# Patient Record
Sex: Female | Born: 1950
Health system: Southern US, Community
[De-identification: ages and names within clinical notes are randomized; demographics above are authoritative.]

## PROBLEM LIST (undated history)

## (undated) DIAGNOSIS — K219 Gastro-esophageal reflux disease without esophagitis: Secondary | ICD-10-CM

## (undated) DIAGNOSIS — D689 Coagulation defect, unspecified: Secondary | ICD-10-CM

## (undated) DIAGNOSIS — T50905A Adverse effect of unspecified drugs, medicaments and biological substances, initial encounter: Secondary | ICD-10-CM

## (undated) DIAGNOSIS — G20A1 Parkinson's disease without dyskinesia, without mention of fluctuations: Secondary | ICD-10-CM

## (undated) DIAGNOSIS — J45909 Unspecified asthma, uncomplicated: Secondary | ICD-10-CM

## (undated) DIAGNOSIS — C801 Malignant (primary) neoplasm, unspecified: Secondary | ICD-10-CM

## (undated) DIAGNOSIS — N189 Chronic kidney disease, unspecified: Secondary | ICD-10-CM

## (undated) DIAGNOSIS — G629 Polyneuropathy, unspecified: Secondary | ICD-10-CM

## (undated) DIAGNOSIS — I739 Peripheral vascular disease, unspecified: Secondary | ICD-10-CM

## (undated) DIAGNOSIS — M810 Age-related osteoporosis without current pathological fracture: Secondary | ICD-10-CM

## (undated) DIAGNOSIS — M8580 Other specified disorders of bone density and structure, unspecified site: Secondary | ICD-10-CM

## (undated) DIAGNOSIS — H269 Unspecified cataract: Secondary | ICD-10-CM

## (undated) DIAGNOSIS — R112 Nausea with vomiting, unspecified: Secondary | ICD-10-CM

## (undated) DIAGNOSIS — M1990 Unspecified osteoarthritis, unspecified site: Secondary | ICD-10-CM

## (undated) DIAGNOSIS — G43909 Migraine, unspecified, not intractable, without status migrainosus: Secondary | ICD-10-CM

## (undated) DIAGNOSIS — R161 Splenomegaly, not elsewhere classified: Secondary | ICD-10-CM

## (undated) DIAGNOSIS — D6959 Other secondary thrombocytopenia: Secondary | ICD-10-CM

## (undated) DIAGNOSIS — K769 Liver disease, unspecified: Secondary | ICD-10-CM

## (undated) DIAGNOSIS — D649 Anemia, unspecified: Secondary | ICD-10-CM

## (undated) DIAGNOSIS — J189 Pneumonia, unspecified organism: Secondary | ICD-10-CM

## (undated) DIAGNOSIS — K449 Diaphragmatic hernia without obstruction or gangrene: Secondary | ICD-10-CM

## (undated) DIAGNOSIS — G20 Parkinson's disease: Secondary | ICD-10-CM

## (undated) DIAGNOSIS — K766 Portal hypertension: Secondary | ICD-10-CM

## (undated) DIAGNOSIS — Z8489 Family history of other specified conditions: Secondary | ICD-10-CM

## (undated) DIAGNOSIS — F329 Major depressive disorder, single episode, unspecified: Secondary | ICD-10-CM

## (undated) DIAGNOSIS — K7460 Unspecified cirrhosis of liver: Secondary | ICD-10-CM

## (undated) DIAGNOSIS — K269 Duodenal ulcer, unspecified as acute or chronic, without hemorrhage or perforation: Secondary | ICD-10-CM

## (undated) DIAGNOSIS — T7840XA Allergy, unspecified, initial encounter: Secondary | ICD-10-CM

## (undated) DIAGNOSIS — D71 Functional disorders of polymorphonuclear neutrophils: Secondary | ICD-10-CM

## (undated) DIAGNOSIS — Z9889 Other specified postprocedural states: Secondary | ICD-10-CM

## (undated) DIAGNOSIS — R7989 Other specified abnormal findings of blood chemistry: Secondary | ICD-10-CM

## (undated) DIAGNOSIS — E049 Nontoxic goiter, unspecified: Secondary | ICD-10-CM

## (undated) DIAGNOSIS — I10 Essential (primary) hypertension: Secondary | ICD-10-CM

## (undated) DIAGNOSIS — F32A Depression, unspecified: Secondary | ICD-10-CM

## (undated) DIAGNOSIS — I209 Angina pectoris, unspecified: Secondary | ICD-10-CM

## (undated) DIAGNOSIS — Z87442 Personal history of urinary calculi: Secondary | ICD-10-CM

## (undated) HISTORY — DX: Diaphragmatic hernia without obstruction or gangrene: K44.9

## (undated) HISTORY — DX: Gastro-esophageal reflux disease without esophagitis: K21.9

## (undated) HISTORY — DX: Chronic kidney disease, unspecified: N18.9

## (undated) HISTORY — DX: Unspecified osteoarthritis, unspecified site: M19.90

## (undated) HISTORY — PX: APPENDECTOMY: SHX54

## (undated) HISTORY — DX: Polyneuropathy, unspecified: G62.9

## (undated) HISTORY — DX: Anemia, unspecified: D64.9

## (undated) HISTORY — DX: Other secondary thrombocytopenia: T50.905A

## (undated) HISTORY — DX: Malignant (primary) neoplasm, unspecified: C80.1

## (undated) HISTORY — DX: Parkinson's disease: G20

## (undated) HISTORY — DX: Portal hypertension: K76.6

## (undated) HISTORY — PX: BACK SURGERY: SHX140

## (undated) HISTORY — PX: UPPER GASTROINTESTINAL ENDOSCOPY: SHX188

## (undated) HISTORY — PX: EYE SURGERY: SHX253

## (undated) HISTORY — DX: Splenomegaly, not elsewhere classified: R16.1

## (undated) HISTORY — DX: Other specified disorders of bone density and structure, unspecified site: M85.80

## (undated) HISTORY — DX: Functional disorders of polymorphonuclear neutrophils: D71

## (undated) HISTORY — DX: Other secondary thrombocytopenia: D69.59

## (undated) HISTORY — DX: Other specified abnormal findings of blood chemistry: R79.89

## (undated) HISTORY — DX: Parkinson's disease without dyskinesia, without mention of fluctuations: G20.A1

## (undated) HISTORY — DX: Unspecified cataract: H26.9

## (undated) HISTORY — DX: Liver disease, unspecified: K76.9

## (undated) HISTORY — DX: Angina pectoris, unspecified: I20.9

## (undated) HISTORY — DX: Allergy, unspecified, initial encounter: T78.40XA

## (undated) HISTORY — PX: GASTROSTOMY: SHX151

## (undated) HISTORY — PX: ABDOMINAL HYSTERECTOMY: SHX81

## (undated) HISTORY — DX: Age-related osteoporosis without current pathological fracture: M81.0

## (undated) HISTORY — PX: COLONOSCOPY: SHX174

## (undated) HISTORY — DX: Coagulation defect, unspecified: D68.9

---

## 1997-11-14 ENCOUNTER — Ambulatory Visit (HOSPITAL_COMMUNITY): Admission: RE | Admit: 1997-11-14 | Discharge: 1997-11-14 | Payer: Self-pay | Admitting: *Deleted

## 2008-05-20 HISTORY — PX: ESOPHAGOGASTRODUODENOSCOPY: SHX1529

## 2014-04-08 ENCOUNTER — Other Ambulatory Visit (HOSPITAL_COMMUNITY): Payer: Self-pay | Admitting: Internal Medicine

## 2014-04-08 DIAGNOSIS — Z1231 Encounter for screening mammogram for malignant neoplasm of breast: Secondary | ICD-10-CM

## 2014-04-22 ENCOUNTER — Ambulatory Visit (HOSPITAL_COMMUNITY): Payer: Self-pay | Attending: Internal Medicine

## 2014-05-28 ENCOUNTER — Ambulatory Visit: Payer: Self-pay

## 2015-08-25 DIAGNOSIS — L249 Irritant contact dermatitis, unspecified cause: Secondary | ICD-10-CM | POA: Insufficient documentation

## 2015-08-25 HISTORY — DX: Irritant contact dermatitis, unspecified cause: L24.9

## 2015-10-30 ENCOUNTER — Emergency Department (HOSPITAL_COMMUNITY): Payer: Medicare Other

## 2015-10-30 ENCOUNTER — Encounter (HOSPITAL_COMMUNITY): Payer: Self-pay | Admitting: Emergency Medicine

## 2015-10-30 ENCOUNTER — Emergency Department (HOSPITAL_COMMUNITY)
Admission: EM | Admit: 2015-10-30 | Discharge: 2015-10-30 | Disposition: A | Discharge status: Home / Self Care | Payer: Medicare Other | Attending: Emergency Medicine | Admitting: Emergency Medicine

## 2015-10-30 DIAGNOSIS — I10 Essential (primary) hypertension: Secondary | ICD-10-CM | POA: Insufficient documentation

## 2015-10-30 DIAGNOSIS — L03311 Cellulitis of abdominal wall: Secondary | ICD-10-CM | POA: Diagnosis not present

## 2015-10-30 DIAGNOSIS — T85528A Displacement of other gastrointestinal prosthetic devices, implants and grafts, initial encounter: Secondary | ICD-10-CM | POA: Diagnosis not present

## 2015-10-30 DIAGNOSIS — T85848A Pain due to other internal prosthetic devices, implants and grafts, initial encounter: Secondary | ICD-10-CM

## 2015-10-30 DIAGNOSIS — Y738 Miscellaneous gastroenterology and urology devices associated with adverse incidents, not elsewhere classified: Secondary | ICD-10-CM | POA: Insufficient documentation

## 2015-10-30 DIAGNOSIS — G20 Parkinson's disease: Secondary | ICD-10-CM | POA: Diagnosis not present

## 2015-10-30 HISTORY — DX: Essential (primary) hypertension: I10

## 2015-10-30 HISTORY — DX: Duodenal ulcer, unspecified as acute or chronic, without hemorrhage or perforation: K26.9

## 2015-10-30 LAB — COMPREHENSIVE METABOLIC PANEL
ALBUMIN: 4.1 g/dL (ref 3.5–5.0)
ALK PHOS: 96 U/L (ref 38–126)
ALT: 30 U/L (ref 14–54)
ANION GAP: 9 (ref 5–15)
AST: 54 U/L — ABNORMAL HIGH (ref 15–41)
BUN: 15 mg/dL (ref 6–20)
CHLORIDE: 104 mmol/L (ref 101–111)
CO2: 29 mmol/L (ref 22–32)
Calcium: 9.3 mg/dL (ref 8.9–10.3)
Creatinine, Ser: 0.6 mg/dL (ref 0.44–1.00)
GFR calc Af Amer: >60 mL/min (ref >60)
GFR calc non Af Amer: >60 mL/min (ref >60)
GLUCOSE: 99 mg/dL (ref 65–99)
POTASSIUM: 3.7 mmol/L (ref 3.5–5.1)
Sodium: 142 mmol/L (ref 135–145)
Total Bilirubin: 1.5 mg/dL — ABNORMAL HIGH (ref 0.3–1.2)
Total Protein: 7.7 g/dL (ref 6.5–8.1)

## 2015-10-30 LAB — CBC WITH DIFFERENTIAL/PLATELET
Basophils Absolute: 0.1 10*3/uL (ref 0.0–0.1)
Basophils Relative: 1 %
Eosinophils Absolute: 0.1 10*3/uL (ref 0.0–0.7)
Eosinophils Relative: 2 %
HCT: 38.6 % (ref 36.0–46.0)
Hemoglobin: 13 g/dL (ref 12.0–15.0)
Lymphocytes Relative: 23 %
Lymphs Abs: 1.2 10*3/uL (ref 0.7–4.0)
MCH: 31.6 pg (ref 26.0–34.0)
MCHC: 33.7 g/dL (ref 30.0–36.0)
MCV: 93.9 fL (ref 78.0–100.0)
Monocytes Absolute: 0.4 10*3/uL (ref 0.1–1.0)
Monocytes Relative: 7 %
Neutro Abs: 3.3 10*3/uL (ref 1.7–7.7)
Neutrophils Relative %: 67 %
Platelets: 112 10*3/uL — ABNORMAL LOW (ref 150–400)
RBC: 4.11 MIL/uL (ref 3.87–5.11)
RDW: 13.5 % (ref 11.5–15.5)
WBC: 5.1 10*3/uL (ref 4.0–10.5)

## 2015-10-30 LAB — POC OCCULT BLOOD, ED: FECAL OCCULT BLD: NEGATIVE

## 2015-10-30 LAB — PROTIME-INR
INR: 1.31 (ref 0.00–1.49)
PROTHROMBIN TIME: 15.9 s — AB (ref 11.6–15.2)

## 2015-10-30 LAB — LIPASE, BLOOD: Lipase: 16 U/L (ref 11–51)

## 2015-10-30 MED ORDER — DIATRIZOATE MEGLUMINE & SODIUM 66-10 % PO SOLN
30.0000 mL | Freq: Once | ORAL | Status: AC
  Administered 2015-10-30: 30 mL

## 2015-10-30 MED ORDER — IOPAMIDOL (ISOVUE-300) INJECTION 61%
100.0000 mL | Freq: Once | INTRAVENOUS | Status: AC | PRN
  Administered 2015-10-30: 100 mL via INTRAVENOUS

## 2015-10-30 MED ORDER — SODIUM CHLORIDE 0.9 % IV BOLUS (SEPSIS)
500.0000 mL | Freq: Once | INTRAVENOUS | Status: AC
  Administered 2015-10-30: 500 mL via INTRAVENOUS

## 2015-10-30 MED ORDER — CLINDAMYCIN PALMITATE HCL 75 MG/5ML PO SOLR
300.0000 mg | Freq: Once | ORAL | Status: AC
  Administered 2015-10-30: 300 mg
  Filled 2015-10-30: qty 20

## 2015-10-30 MED ORDER — HYDROCODONE-ACETAMINOPHEN 7.5-325 MG/15ML PO SOLN
15.0000 mL | Freq: Once | ORAL | Status: AC
  Administered 2015-10-30: 15 mL via ORAL
  Filled 2015-10-30: qty 15

## 2015-10-30 MED ORDER — CLINDAMYCIN PALMITATE HCL 75 MG/5ML PO SOLR: 300.0000 mg | Freq: Four times a day (QID) | ORAL | Status: DC

## 2015-10-30 NOTE — ED Notes (Signed)
MD at bedside. 

## 2015-10-30 NOTE — ED Notes (Signed)
D/c after dry dressing applied to g-tube site.

## 2015-10-30 NOTE — ED Notes (Signed)
Waiting on antibiotics from pharmacy prior to discharge.

## 2015-10-30 NOTE — ED Provider Notes (Signed)
CSN: [id number]     Arrival date & time 10/30/15  1420 History   First MD Initiated Contact with Patient 10/30/15 1429     Chief Complaint  Patient presents with  . Bleeding from PEG Tube      (Consider location/radiation/quality/duration/timing/severity/associated sxs/prior Treatment) HPI  65 year old female with a history of Parkinson's and a PEG tube presents with bleeding around her PEG tube site. She states that yesterday her PEG tube fell out, which is not uncommon for her. She went to [facility] where they placed a Foley and sent her home. Sometime today (unclear when, patient is a poor historian) she had bleeding around the site. She states it was "a lot". Not currently bleeding. There was no blood coming out of the Foley catheter. Patient has redness around her PEG tube site, patient states it has been this way for about 2 weeks. There has also been drainage around the tube for about 2 weeks. States she's had fevers on and off, none currently.   No past medical history on file. No past surgical history on file. No family history on file. Social History  Substance Use Topics  . Smoking status: Not on file  . Smokeless tobacco: Not on file  . Alcohol Use: Not on file   OB History    No data available     Review of Systems  Gastrointestinal: Positive for abdominal pain. Negative for vomiting.  Skin: Positive for color change and wound.  All other systems reviewed and are negative.     Allergies  Review of patient's allergies indicates not on file.  Home Medications   Prior to Admission medications   Not on File   BP 141/70 mmHg  Pulse 101  Temp(Src) 98.3 F (36.8 C) (Oral)  Resp 18  SpO2 92% Physical Exam  Constitutional: She is oriented to person, place, and time. She appears well-developed and well-nourished.  HENT:  Head: Normocephalic and atraumatic.  Right Ear: External ear normal.  Left Ear: External ear normal.  Nose: Nose normal.  Eyes: Right  eye exhibits no discharge. Left eye exhibits no discharge.  Cardiovascular: Normal rate, regular rhythm and normal heart sounds.   Pulmonary/Chest: Effort normal and breath sounds normal.  Abdominal: Soft. There is tenderness in the epigastric area.    Neurological: She is alert and oriented to person, place, and time.  Skin: Skin is warm and dry.  Nursing note and vitals reviewed.   ED Course  Procedures (including critical care time) Labs Review Labs Reviewed  COMPREHENSIVE METABOLIC PANEL - Abnormal; Notable for the following:    AST 54 (*)    Total Bilirubin 1.5 (*)    All other components within normal limits  CBC WITH DIFFERENTIAL/PLATELET - Abnormal; Notable for the following:    Platelets 112 (*)    All other components within normal limits  PROTIME-INR - Abnormal; Notable for the following:    Prothrombin Time 15.9 (*)    All other components within normal limits  LIPASE, BLOOD  POC OCCULT BLOOD, ED    Imaging Review Dg Abd 1 View  10/30/2015  CLINICAL DATA:  Evaluate percutaneous gastrostomy tube/Foley catheter. EXAM: ABDOMEN - 1 VIEW COMPARISON:  None. FINDINGS: Contrast was injected through the percutaneous gastrostomy and opacifies distal stomach and duodenum. There is no evidence of extravasation. High-density material over the right abdomen mainly conforms to colon. Nonobstructive bowel gas pattern. IMPRESSION: Percutaneous gastrostomy catheter is intraluminal. No suspected extravasation; high-density over the right abdomen is likely  intracolonic and can be confirmed on pending abdominal CT. Electronically Signed   By: Monte Fantasia M.D.   On: 10/30/2015 15:23   Ct Abdomen Pelvis W Contrast  10/30/2015  CLINICAL DATA:  Bleeding from PEG tube site, PEG tube inserted yesterday after previous PEG tube of 4 years duration fell out ; history Parkinson's, hypertension, ulcer disease, former smoker EXAM: CT ABDOMEN AND PELVIS WITH CONTRAST TECHNIQUE: Multidetector CT imaging  of the abdomen and pelvis was performed using the standard protocol following bolus administration of intravenous contrast. Sagittal and coronal MPR images reconstructed from axial data set. CONTRAST:  185mL ISOVUE-300 IOPAMIDOL (ISOVUE-300) INJECTION 61% IV. Dilute oral contrast. COMPARISON:  None FINDINGS: Subsegmental atelectasis RIGHT lower lobe. Nodular appearing hepatic margins question cirrhosis. Liver, gallbladder, spleen, kidneys, and adrenal glands otherwise normal appearance. Significant fatty replacement of pancreas without definite focal mass. PEG tube enters stomach with tip extending into proximal descending duodenum. Stomach decompressed without obvious wall thickening, mass or surrounding infiltration. Prominent stool in rectum. Bowel loops otherwise normal appearance. Uterus surgically absent with nonvisualization of ovaries and appendix. Unremarkable bladder and ureters. Scattered atherosclerotic calcifications. No mass, adenopathy, free air, free fluid, or inflammatory process. Scattered degenerative changes of the thoracolumbar spine. IMPRESSION: PEG tube traverses stomach with tip in proximal descending duodenum. No definite focal gastric or duodenal abnormalities otherwise identified. Question cirrhotic liver. Fatty replacement of pancreas. Electronically Signed   By: Lavonia Dana M.D.   On: 10/30/2015 17:04   I have personally reviewed and evaluated these images and lab results as part of my medical decision-making.   EKG Interpretation None      MDM   Final diagnoses:  Cellulitis of abdominal wall  Pain around PEG tube site, initial encounter    No significant abnormalities on CT scan. I think the bleeding became up around her PEG tube site was likely from irritation of the Foley catheter being placed last night. No signs of significant bleeding at this time. The skin around her PEG tube site is quite irritated and there has been a low but of discharge. I will treat her for  cellulitis with oral antibiotics. Highly doubt acute GI bleeding. Hemoglobin is stable. Her vital signs are stable and she appears quite comfortable. His charge home to follow-up with her outpatient gastroenterologist for replacement of her PEG tube as the Foley catheter is currently working.    Sherwood Gambler, MD 10/30/15 1729

## 2015-10-30 NOTE — ED Notes (Signed)
Bed: WA17 Expected date:  Expected time:  Means of arrival:  Comments: EMS- 65 yo bleeding around PEG tube

## 2015-10-30 NOTE — ED Notes (Signed)
Pt c/o bleeding from site of PEG tube, which was inserted yesterday at [facility]. Pt had had a previous PEG tube x 4 years which fell out yesterday.

## 2015-11-26 ENCOUNTER — Emergency Department (HOSPITAL_COMMUNITY)
Admission: EM | Admit: 2015-11-26 | Discharge: 2015-11-26 | Disposition: A | Discharge status: Home / Self Care | Payer: Medicare Other | Attending: Emergency Medicine | Admitting: Emergency Medicine

## 2015-11-26 ENCOUNTER — Emergency Department (HOSPITAL_COMMUNITY): Payer: Medicare Other

## 2015-11-26 ENCOUNTER — Encounter (HOSPITAL_COMMUNITY): Payer: Self-pay | Admitting: Emergency Medicine

## 2015-11-26 DIAGNOSIS — I10 Essential (primary) hypertension: Secondary | ICD-10-CM | POA: Diagnosis not present

## 2015-11-26 DIAGNOSIS — Y732 Prosthetic and other implants, materials and accessory gastroenterology and urology devices associated with adverse incidents: Secondary | ICD-10-CM | POA: Insufficient documentation

## 2015-11-26 DIAGNOSIS — R1032 Left lower quadrant pain: Secondary | ICD-10-CM | POA: Diagnosis not present

## 2015-11-26 DIAGNOSIS — G20 Parkinson's disease: Secondary | ICD-10-CM | POA: Insufficient documentation

## 2015-11-26 DIAGNOSIS — Z4659 Encounter for fitting and adjustment of other gastrointestinal appliance and device: Secondary | ICD-10-CM

## 2015-11-26 DIAGNOSIS — T85598A Other mechanical complication of other gastrointestinal prosthetic devices, implants and grafts, initial encounter: Secondary | ICD-10-CM | POA: Diagnosis present

## 2015-11-26 DIAGNOSIS — Z87891 Personal history of nicotine dependence: Secondary | ICD-10-CM | POA: Diagnosis not present

## 2015-11-26 LAB — COMPREHENSIVE METABOLIC PANEL
ALBUMIN: 3.9 g/dL (ref 3.5–5.0)
ALK PHOS: 80 U/L (ref 38–126)
ALT: 25 U/L (ref 14–54)
AST: 30 U/L (ref 15–41)
Anion gap: 8 (ref 5–15)
BILIRUBIN TOTAL: 1.1 mg/dL (ref 0.3–1.2)
BUN: 11 mg/dL (ref 6–20)
CALCIUM: 9 mg/dL (ref 8.9–10.3)
CO2: 27 mmol/L (ref 22–32)
Chloride: 105 mmol/L (ref 101–111)
Creatinine, Ser: 0.57 mg/dL (ref 0.44–1.00)
GFR calc Af Amer: >60 mL/min (ref >60)
GFR calc non Af Amer: >60 mL/min (ref >60)
GLUCOSE: 88 mg/dL (ref 65–99)
POTASSIUM: 3.8 mmol/L (ref 3.5–5.1)
SODIUM: 140 mmol/L (ref 135–145)
TOTAL PROTEIN: 7.2 g/dL (ref 6.5–8.1)

## 2015-11-26 LAB — CBC WITH DIFFERENTIAL/PLATELET
BASOS ABS: 0 10*3/uL (ref 0.0–0.1)
BASOS PCT: 0 %
Eosinophils Absolute: 0.1 10*3/uL (ref 0.0–0.7)
Eosinophils Relative: 2 %
HEMATOCRIT: 37.6 % (ref 36.0–46.0)
HEMOGLOBIN: 12.9 g/dL (ref 12.0–15.0)
Lymphocytes Relative: 30 %
Lymphs Abs: 1.5 10*3/uL (ref 0.7–4.0)
MCH: 32.3 pg (ref 26.0–34.0)
MCHC: 34.3 g/dL (ref 30.0–36.0)
MCV: 94 fL (ref 78.0–100.0)
Monocytes Absolute: 0.4 10*3/uL (ref 0.1–1.0)
Monocytes Relative: 7 %
NEUTROS ABS: 3.1 10*3/uL (ref 1.7–7.7)
Neutrophils Relative %: 61 %
Platelets: 92 10*3/uL — ABNORMAL LOW (ref 150–400)
RBC: 4 MIL/uL (ref 3.87–5.11)
RDW: 12.6 % (ref 11.5–15.5)
WBC: 5 10*3/uL (ref 4.0–10.5)

## 2015-11-26 MED ORDER — IOPAMIDOL (ISOVUE-300) INJECTION 61%
100.0000 mL | Freq: Once | INTRAVENOUS | Status: AC | PRN
  Administered 2015-11-26: 100 mL via INTRAVENOUS

## 2015-11-26 MED ORDER — DIATRIZOATE MEGLUMINE & SODIUM 66-10 % PO SOLN
15.0000 mL | Freq: Once | ORAL | Status: AC
  Administered 2015-11-26: 15 mL via ORAL

## 2015-11-26 MED ORDER — HYDROMORPHONE HCL 1 MG/ML IJ SOLN
0.5000 mg | Freq: Once | INTRAMUSCULAR | Status: AC
  Administered 2015-11-26: 0.5 mg via INTRAVENOUS
  Filled 2015-11-26: qty 1

## 2015-11-26 MED ORDER — SILVER SULFADIAZINE 1 % EX CREA
TOPICAL_CREAM | Freq: Two times a day (BID) | CUTANEOUS | Status: DC
  Filled 2015-11-26: qty 50

## 2015-11-26 MED ORDER — ONDANSETRON HCL 4 MG/2ML IJ SOLN
4.0000 mg | Freq: Once | INTRAMUSCULAR | Status: AC
  Administered 2015-11-26: 4 mg via INTRAVENOUS
  Filled 2015-11-26: qty 2

## 2015-11-26 MED ORDER — LIDOCAINE HCL 2 % IJ SOLN
10.0000 mL | Freq: Once | INTRAMUSCULAR | Status: AC
  Administered 2015-11-26: 200 mg
  Filled 2015-11-26: qty 20

## 2015-11-26 NOTE — ED Notes (Signed)
Pt complaint of right abdominal pain radiating to back related to feeding tube dislodged/removed with transfer.

## 2015-11-26 NOTE — ED Provider Notes (Signed)
6:35 PM patient feels awake alert ready to go home. Results for orders placed or performed during the hospital encounter of 11/26/15  CBC with Differential/Platelet  Result Value Ref Range   WBC 5.0 4.0 - 10.5 K/uL   RBC 4.00 3.87 - 5.11 MIL/uL   Hemoglobin 12.9 12.0 - 15.0 g/dL   HCT 37.6 36.0 - 46.0 %   MCV 94.0 78.0 - 100.0 fL   MCH 32.3 26.0 - 34.0 pg   MCHC 34.3 30.0 - 36.0 g/dL   RDW 12.6 11.5 - 15.5 %   Platelets 92 (L) 150 - 400 K/uL   Neutrophils Relative % 61 %   Neutro Abs 3.1 1.7 - 7.7 K/uL   Lymphocytes Relative 30 %   Lymphs Abs 1.5 0.7 - 4.0 K/uL   Monocytes Relative 7 %   Monocytes Absolute 0.4 0.1 - 1.0 K/uL   Eosinophils Relative 2 %   Eosinophils Absolute 0.1 0.0 - 0.7 K/uL   Basophils Relative 0 %   Basophils Absolute 0.0 0.0 - 0.1 K/uL  Comprehensive metabolic panel  Result Value Ref Range   Sodium 140 135 - 145 mmol/L   Potassium 3.8 3.5 - 5.1 mmol/L   Chloride 105 101 - 111 mmol/L   CO2 27 22 - 32 mmol/L   Glucose, Bld 88 65 - 99 mg/dL   BUN 11 6 - 20 mg/dL   Creatinine, Ser 0.57 0.44 - 1.00 mg/dL   Calcium 9.0 8.9 - 10.3 mg/dL   Total Protein 7.2 6.5 - 8.1 g/dL   Albumin 3.9 3.5 - 5.0 g/dL   AST 30 15 - 41 U/L   ALT 25 14 - 54 U/L   Alkaline Phosphatase 80 38 - 126 U/L   Total Bilirubin 1.1 0.3 - 1.2 mg/dL   GFR calc non Af Amer >60 >60 mL/min   GFR calc Af Amer >60 >60 mL/min   Anion gap 8 5 - 15   Dg Abd 1 View  10/30/2015  CLINICAL DATA:  Evaluate percutaneous gastrostomy tube/Foley catheter. EXAM: ABDOMEN - 1 VIEW COMPARISON:  None. FINDINGS: Contrast was injected through the percutaneous gastrostomy and opacifies distal stomach and duodenum. There is no evidence of extravasation. High-density material over the right abdomen mainly conforms to colon. Nonobstructive bowel gas pattern. IMPRESSION: Percutaneous gastrostomy catheter is intraluminal. No suspected extravasation; high-density over the right abdomen is likely intracolonic and can be  confirmed on pending abdominal CT. Electronically Signed   By: Monte Fantasia M.D.   On: 10/30/2015 15:23   Ct Abdomen Pelvis W Contrast  11/26/2015  CLINICAL DATA:  Right abdominal pain radiating to back. Feeding tube dislodged with transfer EXAM: CT ABDOMEN AND PELVIS WITH CONTRAST TECHNIQUE: Multidetector CT imaging of the abdomen and pelvis was performed using the standard protocol following bolus administration of intravenous contrast. CONTRAST:  126mL ISOVUE-300 IOPAMIDOL (ISOVUE-300) INJECTION 61% COMPARISON:  10/30/2015 FINDINGS: Lower chest: Linear areas of atelectasis in the lung bases. Heart is normal size. Hepatobiliary: Nodular contours of the compatible with cirrhosis. No focal abnormality. Gallbladder unremarkable. Pancreas: Diffuse fatty replacement of the pancreas. No focal abnormality or ductal dilatation. Spleen: Mild splenomegaly with a craniocaudal length of 15.5 cm. No focal abnormality. Adrenals/Urinary Tract: No adrenal abnormality. No focal renal abnormality. No stones or hydronephrosis. Urinary bladder is unremarkable. Stomach/Bowel: PEG tube is in place. The tip is in the distal stomach near the pylorus. No evidence of bowel obstruction. Vascular/Lymphatic: Diffuse aortic and iliac calcifications. No aneurysm. Reproductive: Prior hysterectomy.  No  adnexal mass. Other: No free fluid or free air. Musculoskeletal: No acute bony abnormality or focal bone lesion. IMPRESSION: Findings compatible with cirrhosis with associated splenomegaly. PEG tube tip is in the distal stomach in the region of the pylorus. Electronically Signed   By: Rolm Baptise M.D.   On: 11/26/2015 18:15   Ct Abdomen Pelvis W Contrast  10/30/2015  CLINICAL DATA:  Bleeding from PEG tube site, PEG tube inserted yesterday after previous PEG tube of 4 years duration fell out ; history Parkinson's, hypertension, ulcer disease, former smoker EXAM: CT ABDOMEN AND PELVIS WITH CONTRAST TECHNIQUE: Multidetector CT imaging of the  abdomen and pelvis was performed using the standard protocol following bolus administration of intravenous contrast. Sagittal and coronal MPR images reconstructed from axial data set. CONTRAST:  147mL ISOVUE-300 IOPAMIDOL (ISOVUE-300) INJECTION 61% IV. Dilute oral contrast. COMPARISON:  None FINDINGS: Subsegmental atelectasis RIGHT lower lobe. Nodular appearing hepatic margins question cirrhosis. Liver, gallbladder, spleen, kidneys, and adrenal glands otherwise normal appearance. Significant fatty replacement of pancreas without definite focal mass. PEG tube enters stomach with tip extending into proximal descending duodenum. Stomach decompressed without obvious wall thickening, mass or surrounding infiltration. Prominent stool in rectum. Bowel loops otherwise normal appearance. Uterus surgically absent with nonvisualization of ovaries and appendix. Unremarkable bladder and ureters. Scattered atherosclerotic calcifications. No mass, adenopathy, free air, free fluid, or inflammatory process. Scattered degenerative changes of the thoracolumbar spine. IMPRESSION: PEG tube traverses stomach with tip in proximal descending duodenum. No definite focal gastric or duodenal abnormalities otherwise identified. Question cirrhotic liver. Fatty replacement of pancreas. Electronically Signed   By: Lavonia Dana M.D.   On: 10/30/2015 17:04   Plan follow-up with Dr. Sheryle Hail as needed she has pain medicine which she can take at home  Orlie Dakin, MD 11/26/15 1839

## 2015-11-26 NOTE — ED Provider Notes (Signed)
CSN: [id number]     Arrival date & time 11/26/15  1436 History   First MD Initiated Contact with Patient 11/26/15 1447     Chief Complaint  Patient presents with  . Feed Tube Dislodged      (Consider location/radiation/quality/duration/timing/severity/associated sxs/prior Treatment) HPI Comments: Patient is a 65 year old female with a history of Parkinson's disease with a G-tube due to severe dysphasia, duodenal ulcer, hypertension and chronic pain where she takes hydrocodone and methadone daily presenting today because her G-tube came out. She also has noted in the last 2-3 days she's had worsening abdominal pain in the left lower quadrant. She describes the pain as sharp and stabbing. It radiates around like a band. Palpation and movement seem to make it worse. Based on the patient's story it sounds like she's had this pain before but it's more severe in the last few days. She's had no change in her bowel movements or urine. She denies any nausea or vomiting. She has had no new medications. Her G-tube was last changed about one month ago.  The history is provided by the patient.    Past Medical History  Diagnosis Date  . Parkinson disease ([place])   . Duodenal ulcer   . Hypertension    Past Surgical History  Procedure Laterality Date  . Abdominal hysterectomy     No family history on file. Social History  Substance Use Topics  . Smoking status: Former Research scientist (life sciences)  . Smokeless tobacco: None  . Alcohol Use: No   OB History    No data available     Review of Systems  Respiratory: Negative for cough and shortness of breath.   All other systems reviewed and are negative.     Allergies  Review of patient's allergies indicates no known allergies.  Home Medications   Prior to Admission medications   Medication Sig Start Date End Date Taking? Authorizing Provider  carbidopa-levodopa (SINEMET CR) 50-200 MG tablet Take 1 tablet by mouth 4 (four) times daily.  10/22/15  Yes Historical  Provider, MD  diazepam (VALIUM) 1 MG/ML solution Take 1 mg by mouth every 8 (eight) hours as needed for anxiety.  10/22/15  Yes Historical Provider, MD  HYDROcodone-acetaminophen (HYCET) 7.5-325 mg/15 ml solution Take 7.5 mLs by mouth every 6 (six) hours as needed for severe pain.  10/22/15  Yes Historical Provider, MD  methadone (DOLOPHINE) 1 MG/1ML solution Take 1 mg by mouth every 8 (eight) hours as needed for severe pain.  10/22/15  Yes Historical Provider, MD  clindamycin (CLEOCIN) 75 MG/5ML solution Place 20 mLs (300 mg total) into feeding tube 4 (four) times daily. 10/30/15   Sherwood Gambler, MD  promethazine (PHENERGAN) 6.25 MG/5ML syrup Take 6.25 mg by mouth every 6 (six) hours as needed for nausea or vomiting.  09/16/15   Historical Provider, MD   BP 113/59 mmHg  Pulse 94  Temp(Src) 98.3 F (36.8 C) (Oral)  Resp 13  Ht 5' (1.524 m)  Wt 140 lb (63.504 kg)  BMI 27.34 kg/m2  SpO2 91% Physical Exam  Constitutional: She is oriented to person, place, and time. She appears well-developed and well-nourished. No distress.  HENT:  Head: Normocephalic and atraumatic.  Mouth/Throat: Oropharynx is clear and moist.  Eyes: Conjunctivae and EOM are normal. Pupils are equal, round, and reactive to light.  Neck: Normal range of motion. Neck supple.  Cardiovascular: Normal rate, regular rhythm and intact distal pulses.   No murmur heard. Pulmonary/Chest: Effort normal and breath sounds normal. No  respiratory distress. She has no wheezes. She has no rales.  Abdominal: Soft. She exhibits no distension. There is tenderness in the left lower quadrant. There is guarding. There is no rebound.    Musculoskeletal: Normal range of motion. She exhibits no edema or tenderness.  Neurological: She is alert and oriented to person, place, and time.  Skin: Skin is warm and dry. No rash noted. No erythema.  Psychiatric: She has a normal mood and affect. Her behavior is normal.  Nursing note and vitals  reviewed.   ED Course  Procedures (including critical care time) Labs Review Labs Reviewed  CBC WITH DIFFERENTIAL/PLATELET - Abnormal; Notable for the following:    Platelets 92 (*)    All other components within normal limits  COMPREHENSIVE METABOLIC PANEL    Imaging Review No results found. I have personally reviewed and evaluated these images and lab results as part of my medical decision-making.   EKG Interpretation None      MDM   Final diagnoses:  None    Patient is a 65 year old female presenting today because her feeding tube became dislodged several hours ago while she was having a bowel movement. Patient also states in the last 3-4 days she's had worsening lower abdominal pain. Is difficult to discern whether this pain is completely new. It sounds like patient has had pain in the past but it is worse currently. Is not affected by tube feeds and she has not had any change in her bowel movements. She denies any nausea or vomiting. She has stable vital signs here. The G-tube was replaced without incident.  CBC and CMP without acute findings. CT of the abdomen to evaluate for obstruction versus diverticulitis versus complications from G-tube pending.  Pt checked out to Dr. Audie Box, MD 11/26/15 [phone]

## 2015-11-26 NOTE — ED Notes (Signed)
Bed: WA04 Expected date:  Expected time:  Means of arrival:  Comments: EMS - feeding tube dislocation

## 2015-11-26 NOTE — Discharge Instructions (Signed)
Take your pain medication as directed. Follow-up with Dr.Haasan if your pain is not well controlled.

## 2015-11-26 NOTE — Progress Notes (Signed)
Pt informed Cm her pcp is sami hassan  EPIC updated

## 2015-11-26 NOTE — ED Notes (Signed)
Below order not completed by EW. 

## 2015-11-26 NOTE — ED Notes (Signed)
Silvadene applied around PEG tube and reinforced with guaze.

## 2015-11-27 ENCOUNTER — Emergency Department (HOSPITAL_COMMUNITY): Payer: Medicare Other

## 2015-11-27 ENCOUNTER — Emergency Department (HOSPITAL_COMMUNITY)
Admission: EM | Admit: 2015-11-27 | Discharge: 2015-11-27 | Disposition: A | Discharge status: Home / Self Care | Payer: Medicare Other | Attending: Emergency Medicine | Admitting: Emergency Medicine

## 2015-11-27 ENCOUNTER — Encounter (HOSPITAL_COMMUNITY): Payer: Self-pay | Admitting: Emergency Medicine

## 2015-11-27 DIAGNOSIS — Z91040 Latex allergy status: Secondary | ICD-10-CM | POA: Insufficient documentation

## 2015-11-27 DIAGNOSIS — Z87891 Personal history of nicotine dependence: Secondary | ICD-10-CM | POA: Diagnosis not present

## 2015-11-27 DIAGNOSIS — G20 Parkinson's disease: Secondary | ICD-10-CM | POA: Diagnosis not present

## 2015-11-27 DIAGNOSIS — Z431 Encounter for attention to gastrostomy: Secondary | ICD-10-CM | POA: Diagnosis not present

## 2015-11-27 DIAGNOSIS — Z79899 Other long term (current) drug therapy: Secondary | ICD-10-CM | POA: Diagnosis not present

## 2015-11-27 DIAGNOSIS — T85598A Other mechanical complication of other gastrointestinal prosthetic devices, implants and grafts, initial encounter: Secondary | ICD-10-CM | POA: Diagnosis present

## 2015-11-27 DIAGNOSIS — Y828 Other medical devices associated with adverse incidents: Secondary | ICD-10-CM | POA: Diagnosis not present

## 2015-11-27 DIAGNOSIS — I10 Essential (primary) hypertension: Secondary | ICD-10-CM | POA: Diagnosis not present

## 2015-11-27 MED ORDER — DIATRIZOATE MEGLUMINE & SODIUM 66-10 % PO SOLN
30.0000 mL | Freq: Once | ORAL | Status: AC
  Administered 2015-11-27: 30 mL

## 2015-11-27 MED ORDER — OXYCODONE HCL 5 MG/5ML PO SOLN
7.5000 mg | Freq: Once | ORAL | Status: AC
  Administered 2015-11-27: 7.5 mg
  Filled 2015-11-27: qty 10

## 2015-11-27 MED ORDER — LIDOCAINE-EPINEPHRINE (PF) 2 %-1:200000 IJ SOLN
20.0000 mL | Freq: Once | INTRAMUSCULAR | Status: AC
  Administered 2015-11-27: 20 mL via INTRADERMAL

## 2015-11-27 MED ORDER — OXYCODONE HCL ER 10 MG PO T12A
10.0000 mg | EXTENDED_RELEASE_TABLET | Freq: Two times a day (BID) | ORAL | Status: DC
  Filled 2015-11-27: qty 1

## 2015-11-27 MED ORDER — KETOROLAC TROMETHAMINE 60 MG/2ML IM SOLN
30.0000 mg | Freq: Once | INTRAMUSCULAR | Status: AC
  Administered 2015-11-27: 30 mg via INTRAMUSCULAR
  Filled 2015-11-27: qty 2

## 2015-11-27 MED ORDER — LIDOCAINE-EPINEPHRINE (PF) 2 %-1:200000 IJ SOLN
INTRAMUSCULAR | Status: AC
  Administered 2015-11-27: 20 mL via INTRADERMAL
  Filled 2015-11-27: qty 20

## 2015-11-27 MED ORDER — ONDANSETRON HCL 4 MG/5ML PO SOLN
4.0000 mg | Freq: Once | ORAL | Status: AC
  Administered 2015-11-27: 4 mg via ORAL
  Filled 2015-11-27: qty 5

## 2015-11-27 MED ORDER — LIDOCAINE-EPINEPHRINE 2 %-1:100000 IJ SOLN
20.0000 mL | Freq: Once | INTRAMUSCULAR | Status: DC
  Filled 2015-11-27: qty 20

## 2015-11-27 NOTE — ED Notes (Signed)
Bed: [facility] Expected date:  Expected time:  Means of arrival:  Comments: EMS- 65 yo- feeding tube displaced

## 2015-11-27 NOTE — ED Notes (Signed)
Pt's feeding tube came out after being placed yesterday.

## 2015-11-27 NOTE — Discharge Instructions (Signed)
Gastrostomy Tube Home Guide, Adult °A gastrostomy tube is a tube that is surgically placed into the stomach. It is also called a "G-tube." G-tubes are used when a person is unable to eat and drink enough on their own to stay healthy. The tube is inserted into the stomach through a small cut (incision) in the skin. This tube is used for: °· Feeding. °· Giving medication. °GASTROSTOMY TUBE CARE °· Wash your hands with soap and water. °· Remove the old dressing (if any). Some styles of G-tubes may need a dressing inserted between the skin and the G-tube. Other types of G-tubes do not require a dressing. Ask your health care provider if a dressing is needed. °· Check the area where the tube enters the skin (insertion site) for redness, swelling, or pus-like (purulent) drainage. A small amount of clear or tan liquid drainage is normal. Check to make sure scar tissue (skin) is not growing around the insertion site. This could have a raised, bumpy appearance. °· A cotton swab can be used to clean the skin around the tube: °¨ When the G-tube is first put in, a normal saline solution or water can be used to clean the skin. °¨ Mild soap and warm water can be used when the skin around the G-tube site has healed. °¨ Roll the cotton swab around the G-tube insertion site to remove any drainage or crusting at the insertion site. °STOMACH RESIDUALS °Feeding tube residuals are the amount of liquids that are in the stomach at any given time. Residuals may be checked before giving feedings, medications, or as instructed by your health care provider. °· Ask your health care provider if there are instances when you would not start tube feedings depending on the amount or type of contents withdrawn from the stomach. °· Check residuals by attaching a syringe to the G-tube and pulling back on the syringe plunger. Note the amount, and return the residual back into the stomach. °FLUSHING THE G-TUBE °· The G-tube should be periodically  flushed with clean warm water to keep it from clogging. °¨ Flush the G-tube after feedings or medications. Draw up 30 mL of warm water in a syringe. Connect the syringe to the G-tube and slowly push the water into the tube. °¨ Do not push feedings, medications, or flushes rapidly. Flush the G-tube gently and slowly. °¨ Only use syringes made for G-tubes to flush medications or feedings. °¨ Your health care provider may want the G-tube flushed more often or with more water. If this is the case, follow your health care provider's instructions. °FEEDINGS °Your health care provider will determine whether feedings are given as a bolus (a certain amount given at one time and at scheduled times) or whether feedings will be given continuously on a feeding pump.  °· Formulas should be given at room temperature. °· If feedings are continuous, no more than 4 hours worth of feedings should be placed in the feeding bag. This helps prevent spoilage or accidental excess infusion. °· Cover and place unused formula in the refrigerator. °· If feedings are continuous, stop the feedings when medications or flushes are given. Be sure to restart the feedings. °· Feeding bags and syringes should be replaced as instructed by your health care provider. °GIVING MEDICATION  °· In general, it is best if all medications are in a liquid form for G-tube administration. Liquid medications are less likely to clog the G-tube. °¨ Mix the liquid medication with 30 mL (or amount recommended by   your health care provider) of warm water. °¨ Draw up the medication into the syringe. °¨ Attach the syringe to the G-tube and slowly push the mixture into the G-tube. °¨ After giving the medication, draw up 30 mL of warm water in the syringe and slowly flush the G-tube. °· For pills or capsules, check with your health care provider first before crushing medications. Some pills are not effective if they are crushed. Some capsules are sustained-release  medications. °¨ If appropriate, crush the pill or capsule and mix with 30 mL of warm water. Using the syringe, slowly push the medication through the tube, then flush the tube with another 30 mL of tap water. °G-TUBE PROBLEMS °G-tube was pulled out. °· Cause: May have been pulled out accidentally. °· Solutions: Cover the opening with clean dressing and tape. Call your health care provider right away. The G-tube should be put in as soon as possible (within 4 hours) so the G-tube opening (tract) does not close. The G-tube needs to be put in at a health care setting. An X-ray needs to be done to confirm placement before the G-tube can be used again. °Redness, irritation, soreness, or foul odor around the gastrostomy site. °· Cause: May be caused by leakage or infection. °· Solutions: Call your health care provider right away. °Large amount of leakage of fluid or mucus-like liquid present (a large amount means it soaks clothing). °· Cause: Many reasons could cause the G-tube to leak. °· Solutions: Call your health care provider to discuss the amount of leakage. °Skin or scar tissue appears to be growing where tube enters skin.  °· Cause: Tissue growth may develop around the insertion site if the G-tube is moved or pulled on excessively. °· Solutions: Secure tube with tape so that excess movement does not occur. Call your health care provider. °G-tube is clogged. °· Cause: Thick formula or medication. °· Solutions: Try to slowly push warm water into the tube with a large syringe. Never try to push any object into the tube to unclog it. Do not force fluid into the G-tube. If you are unable to unclog the tube, call your health care provider right away. °TIPS °· Head of bed (HOB) position refers to the upright position of a person's upper body. °¨ When giving medications or a feeding bolus, keep the HOB up as told by your health care provider. Do this during the feeding and for 1 hour after the feeding or medication  administration. °¨ If continuous feedings are being given, it is best to keep the HOB up as told by your health care provider. When ADLs (activities of daily living) are performed and the HOB needs to be flat, be sure to turn the feeding pump off. Restart the feeding pump when the HOB is returned to the recommended height. °· Do not pull or put tension on the tube. °· To prevent fluid backflow, kink the G-tube before removing the cap or disconnecting a syringe. °· Check the G-tube length every day. Measure from the insertion site to the end of the G-tube. If the length is longer than previous measurements, the tube may be coming out. Call your health care provider if you notice increasing G-tube length. °· Oral care, such as brushing teeth, must be continued. °· You may need to remove excess air (vent) from the G-tube. Your health care provider will tell you if this is needed. °· Always call your health care provider if you have questions or problems with the   G-tube. SEEK IMMEDIATE MEDICAL CARE IF:   You have severe abdominal pain, tenderness, or abdominal bloating (distension).  You have nausea or vomiting.  You are constipated or have problems moving your bowels.  The G-tube insertion site is red, swollen, has a foul smell, or has yellow or brown drainage.  You have difficulty breathing or shortness of breath.  You have a fever.  You have a large amount of feeding tube residuals.  The G-tube is clogged and cannot be flushed. MAKE SURE YOU:   Understand these instructions.  Will watch your condition.  Will get help right away if you are not doing well or get worse.   This information is not intended to replace advice given to you by your health care provider. Make sure you discuss any questions you have with your health care provider.   Document Released: 08/21/2001 Document Revised: 10/27/2014 Document Reviewed: 02/17/2013 Elsevier Interactive Patient Education [employer].

## 2015-11-27 NOTE — ED Provider Notes (Signed)
CSN: [id number]     Arrival date & time 11/27/15  1358 History   First MD Initiated Contact with Patient 11/27/15 1404     Chief Complaint  Patient presents with  . feeding tube came out      (Consider location/radiation/quality/duration/timing/severity/associated sxs/prior Treatment) HPI Comments: Pt comes in after her feeding tube fell out. She reports that the tube fell out on it's own. The G tube has fallen 3 or 4 times the last 1 month, including just yday. Pt denies nausea, emesis, fevers, chills, chest pains, shortness of breath, headaches. She cant tolerate po. The original G tube was placed at [facility].     The history is provided by the patient.    Past Medical History  Diagnosis Date  . Parkinson disease ([place])   . Duodenal ulcer   . Hypertension    Past Surgical History  Procedure Laterality Date  . Abdominal hysterectomy     History reviewed. No pertinent family history. Social History  Substance Use Topics  . Smoking status: Former Research scientist (life sciences)  . Smokeless tobacco: None  . Alcohol Use: No   OB History    No data available     Review of Systems  Constitutional: Negative for activity change.  Gastrointestinal: Positive for abdominal pain. Negative for nausea.  Genitourinary: Negative for dysuria.  Skin: Positive for rash.  Neurological: Negative for headaches.      Allergies  Scopolamine and Latex  Home Medications   Prior to Admission medications   Medication Sig Start Date End Date Taking? Authorizing Provider  carbidopa-levodopa (SINEMET CR) 50-200 MG tablet Give 1 tablet by tube 4 (four) times daily.  10/22/15  Yes Historical Provider, MD  diazepam (VALIUM) 1 MG/ML solution Place 1 mg into feeding tube every 8 (eight) hours as needed for anxiety or muscle spasms.  10/22/15  Yes Historical Provider, MD  HYDROcodone-acetaminophen (HYCET) 7.5-325 mg/15 ml solution Place 7.5 mLs into feeding tube every 6 (six) hours as needed for severe pain.   10/22/15  Yes Historical Provider, MD  methadone (DOLOPHINE) 1 MG/1ML solution Place 1 mg into feeding tube every 8 (eight) hours as needed for severe pain.  10/22/15  Yes Historical Provider, MD  clindamycin (CLEOCIN) 75 MG/5ML solution Place 20 mLs (300 mg total) into feeding tube 4 (four) times daily. Patient not taking: Reported on 11/27/2015 10/30/15   Sherwood Gambler, MD   BP 145/65 mmHg  Pulse 90  Temp(Src) 98.4 F (36.9 C) (Oral)  Resp 13  SpO2 95% Physical Exam  Constitutional: She is oriented to person, place, and time. She appears well-developed.  HENT:  Head: Normocephalic and atraumatic.  Eyes: EOM are normal.  Neck: Normal range of motion. Neck supple.  Cardiovascular: Normal rate.   Pulmonary/Chest: Effort normal.  Abdominal: Bowel sounds are normal.  24 French G tube at the bedside. There is erythema around the stoma.  Neurological: She is alert and oriented to person, place, and time.  Skin: Skin is warm and dry.  Nursing note and vitals reviewed.   ED Course  Gastrostomy tube replacement Date/Time: 11/27/2015 5:54 PM Performed by: Varney Biles Authorized by: Varney Biles Consent: Verbal consent obtained. Risks and benefits: risks, benefits and alternatives were discussed Consent given by: patient Site marked: the operative site was marked Imaging studies: imaging studies available Required items: required blood products, implants, devices, and special equipment available Patient identity confirmed: verbally with patient Time out: Immediately prior to procedure a "time out" was called to verify the  correct patient, procedure, equipment, support staff and site/side marked as required. Preparation: Patient was prepped and draped in the usual sterile fashion. Local anesthesia used: yes Anesthesia: local infiltration Local anesthetic: lidocaine 1% with epinephrine Patient sedated: no Patient tolerance: Patient tolerated the procedure well with no immediate  complications   (including critical care time) Labs Review Labs Reviewed - No data to display  Imaging Review Dg Abd 1 View  11/27/2015  CLINICAL DATA:  Re- insertion of percutaneous gastrostomy catheter EXAM: ABDOMEN - 1 VIEW COMPARISON:  CT abdomen and pelvis November 26, 2015 FINDINGS: 30 mL of Gastrografin was administered via percutaneous gastrostomy catheter. Catheter was subsequently flushed with 20 mL of water. The catheter tip is in the distal stomach. There is no contrast extravasation. Contrast flows from the stomach into the duodenum. The bowel gas pattern is normal. No obstruction or free air is evident. The lung bases are clear. IMPRESSION: Gastrostomy catheter positioned in distal stomach without contrast extravasation. Contrast flows from the stomach into the duodenum. Bowel gas pattern unremarkable. Electronically Signed   By: Lowella Grip III M.D.   On: 11/27/2015 17:25   Ct Abdomen Pelvis W Contrast  11/26/2015  CLINICAL DATA:  Right abdominal pain radiating to back. Feeding tube dislodged with transfer EXAM: CT ABDOMEN AND PELVIS WITH CONTRAST TECHNIQUE: Multidetector CT imaging of the abdomen and pelvis was performed using the standard protocol following bolus administration of intravenous contrast. CONTRAST:  134mL ISOVUE-300 IOPAMIDOL (ISOVUE-300) INJECTION 61% COMPARISON:  10/30/2015 FINDINGS: Lower chest: Linear areas of atelectasis in the lung bases. Heart is normal size. Hepatobiliary: Nodular contours of the compatible with cirrhosis. No focal abnormality. Gallbladder unremarkable. Pancreas: Diffuse fatty replacement of the pancreas. No focal abnormality or ductal dilatation. Spleen: Mild splenomegaly with a craniocaudal length of 15.5 cm. No focal abnormality. Adrenals/Urinary Tract: No adrenal abnormality. No focal renal abnormality. No stones or hydronephrosis. Urinary bladder is unremarkable. Stomach/Bowel: PEG tube is in place. The tip is in the distal stomach near the  pylorus. No evidence of bowel obstruction. Vascular/Lymphatic: Diffuse aortic and iliac calcifications. No aneurysm. Reproductive: Prior hysterectomy.  No adnexal mass. Other: No free fluid or free air. Musculoskeletal: No acute bony abnormality or focal bone lesion. IMPRESSION: Findings compatible with cirrhosis with associated splenomegaly. PEG tube tip is in the distal stomach in the region of the pylorus. Electronically Signed   By: Rolm Baptise M.D.   On: 11/26/2015 18:15   I have personally reviewed and evaluated these images and lab results as part of my medical decision-making.   EKG Interpretation None      MDM   Final diagnoses:  PEG (percutaneous endoscopic gastrostomy) adjustment/replacement/removal (HCC)    PEG tube replaced - and confirmed. Will d.c.   Varney Biles, MD 11/27/15 1827

## 2016-02-17 ENCOUNTER — Other Ambulatory Visit: Payer: Self-pay | Admitting: Internal Medicine

## 2016-02-17 ENCOUNTER — Other Ambulatory Visit: Payer: Self-pay | Admitting: Pediatrics

## 2016-02-17 DIAGNOSIS — Z1231 Encounter for screening mammogram for malignant neoplasm of breast: Secondary | ICD-10-CM

## 2016-03-01 DIAGNOSIS — R110 Nausea: Secondary | ICD-10-CM

## 2016-03-01 HISTORY — DX: Nausea: R11.0

## 2016-03-08 ENCOUNTER — Ambulatory Visit: Payer: Medicare Other

## 2016-05-15 ENCOUNTER — Encounter: Payer: Self-pay | Admitting: Neurology

## 2016-05-15 ENCOUNTER — Ambulatory Visit (INDEPENDENT_AMBULATORY_CARE_PROVIDER_SITE_OTHER): Payer: Medicare Other | Admitting: Neurology

## 2016-05-15 VITALS — BP 157/87 | HR 107

## 2016-05-15 DIAGNOSIS — Z993 Dependence on wheelchair: Secondary | ICD-10-CM | POA: Insufficient documentation

## 2016-05-15 DIAGNOSIS — F332 Major depressive disorder, recurrent severe without psychotic features: Secondary | ICD-10-CM

## 2016-05-15 DIAGNOSIS — R251 Tremor, unspecified: Secondary | ICD-10-CM | POA: Diagnosis not present

## 2016-05-15 DIAGNOSIS — G894 Chronic pain syndrome: Secondary | ICD-10-CM | POA: Diagnosis not present

## 2016-05-15 HISTORY — DX: Major depressive disorder, recurrent severe without psychotic features: F33.2

## 2016-05-15 HISTORY — DX: Morbid (severe) obesity due to excess calories: E66.01

## 2016-05-15 HISTORY — DX: Tremor, unspecified: R25.1

## 2016-05-15 MED ORDER — PROPRANOLOL HCL ER 60 MG PO CP24: 60.0000 mg | ORAL_CAPSULE | Freq: Every day | ORAL | 1 refills | Status: DC

## 2016-05-15 NOTE — Progress Notes (Signed)
Provider:  Larey Seat, M D  Referring Provider: Antonietta Jewel, MD Primary Care Physician:  Antonietta Jewel, MD    HPI:  Amy Moon is a 65 y.o. female  Is seen here as a referral  from Dr. Antonietta Jewel of [place] for ; Evaluation of Parkinson's disease or possible Parkinson's disease.  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 The patient had been slipping off her wheelchair under a table and she tried to pull the patient up as well as holding the wheelchair when she suffered a back injury. She was undergoing neurosurgery for discectomy on the lower back, and her neurosurgeon noticed that she had tremors in both hands. She described that she was diagnosed with Parkinson's disease after several tests. She was titrated to Sinemet 50/204 times daily and remained on this medication for many years probably around 10-12  years. During this time her walking deteriorated and it's not clear if it was due to possible Parkinson's disease or due to orthopedic problems. She does not recall the name of her neurosurgeon, the place where she saw neurology or neurosurgey, or the group associated with him.but Amy Moon also had a feeding tube placed and used this for over 5 years parallel. She has been seen in the emergency room multiple times for feeding tube displacement.  On her own she weaned herself off multiple medications but stayed on Sinemet until this September. This September she was admitted to [facility], for deterioration of her tremors. There is infection of her epigastric area was noted and the feeding tube removed. They stopped Sinemet in the hospital and she has not had it since for the last 2 months. She states that she received Sinemet during her hospital stay but it was not on her discharge papers and she received no prescription for it. She has never seen a neuro specialist, she has not been  seen by a movement disorder specialist. Suddenly , she recalled she saw neurology in [place] , about 12 years ago(!).      Review of Systems: Out of a complete 14 system review, the patient complains of only the following symptoms, and all other reviewed systems are negative.  tremor , right more than left hand, fine tremor amplitude.  No cog-wheeling. She complains of pain.   Social History   Social History  . Marital status: Married    Spouse name: N/A  . Number of children: N/A  . Years of education: N/A   Occupational History  . Not on file.   Social History Main Topics  . Smoking status: Former Research scientist (life sciences)  . Smokeless tobacco: Not on file  . Alcohol use No  . Drug use: Unknown  . Sexual activity: Not on file   Other Topics Concern  . Not on file   Social History Narrative  . No narrative on file    No family history on file.  Past Medical History:  Diagnosis Date  . Duodenal ulcer   . Hypertension   . Parkinson disease [facility])     Past Surgical History:  Procedure Laterality Date  . ABDOMINAL HYSTERECTOMY      Current Outpatient Prescriptions  Medication Sig Dispense Refill  . acetaminophen (TYLENOL) 500 MG tablet Take 1,000 mg by mouth.    . diphenhydrAMINE (BENADRYL) 50 MG tablet Take 50 mg by mouth at bedtime as needed for itching.  No current facility-administered medications for this visit.     Allergies as of 05/15/2016 - Review Complete 05/15/2016  Allergen Reaction Noted  . Indomethacin Other (See Comments) 08/25/2015  . Penicillin g Other (See Comments) 08/25/2015  . Scopolamine  11/27/2015  . Sinemet [carbidopa w-levodopa] Nausea And Vomiting 05/15/2016  . Tape  05/15/2016  . Latex Hives and Rash 11/27/2015    Vitals: BP (!) 157/87   Pulse (!) 107  Last Weight:  Wt Readings from Last 1 Encounters:  11/26/15 140 lb (63.5 kg)   Last Height:   Ht Readings from Last 1 Encounters:  11/26/15 5' (1.524 m)    Physical  exam:  General: The patient is awake, alert and appears not in acute distress. The patient is well groomed.   General: The patient is awake, alert and appears not in acute distress. The patient is well groomed. Head: Normocephalic, atraumatic. Neck is supple. Mallampati 3, neck circumference; 15  Cardiovascular:  Regular rate and rhythm , without  murmurs or carotid bruit, and without distended neck veins. Respiratory: Lungs are clear to auscultation. Skin:  Without evidence of edema, or rash Trunk: BMI is  elevated , the patient is seated in a wheelchair,  Neurologic exam : The patient is awake and alert, oriented to place and time.  Memory subjective described as intact.  There is a normal attention span & concentration ability. Speech is fluent without dysarthria, dysphonia or aphasia. Mood and affect are appropriate.  She has a normal facial expression, a normal blink frequency, no visible drooling, no titubation patient.  Cranial nerves: Pupils are equal and briskly reactive to light. Funduscopic exam without evidence of pallor or edema.  Extraocular movements  in vertical and horizontal planes intact and without nystagmus. Visual fields by finger perimetry are intact. Hearing to finger rub intact.  Facial sensation intact to fine touch. Facial motor strength is symmetric and tongue and uvula move midline.  Motor exam: Normal tone and normal muscle bulk and symmetric normal strength in all extremities. Sensory:  Fine touch, pinprick and vibration were tested in all extremities. Proprioception is tested in the upper extremities only. This was normal. Coordination: Rapid alternating movements in the fingers/hands is tested and normal. Finger-to-nose maneuver tested and normal without evidence of ataxia, dysmetria but she has tremor at rest - right more than left/ Gait and station: in wheelchair. Deep tendon reflexes: in the upper and lower extremities are symmetric and intact.   Patient  reports she was considered for hospice, palliative care - I cannot understand this. There is little documentation and a lot of memory gaps, non sensical information.  She obviously has been injured at work, but I do not know to which degree she has suffered back surgery related nonhealing, versus primary degeneration, or spinal trauma.  [facility] set her up with a pain medicine specialist, recommended neurology follow-up and also referred her back to primary care. I understand that my neurology colleagues at [facility] also felt that she does not have primary Parkinson's disease but a tremor disorder, her pain is not treated by neurology, and her depression will need a psychiatrist to follow.  Essential tremor can be treated with primidone and can also be some treated with beta blocker, or topiramate. She had kidney stones , and she reports a history of hepatitis B or hepatitis C was considered but never confirmed. I have little information from primary care. Mysoline or topiramate would not be used with these  kind of background history.The discharge note form from [facility] was provided per medical records.   Plan:  Treatment plan and additional workup :  This is not a primary  PD movement disorder - probably a familiar or essential tremor. Mrs. Vandevander daughter also has a tremor. I will refer the patient to a movement disorder specialist for confirmation. She was offered a trial of beta blocker for tremor.  I would not resume sinemet.  I asked Dr Sheryle Hail to refer to appropriate pain management and orthopedic/ neurosurgical care. I do not treat fibromyalgia or non organic disorders.,  Amy Moon had apparently been set up by [facility] for these follow-up visits, but couldn't take advantage of them.         Amy Partridge Alette Kataoka MD 05/15/2016

## 2016-05-15 NOTE — Patient Instructions (Signed)
Essential Tremor Introduction A tremor is trembling or shaking that you cannot control. Most tremors affect the hands or arms. Tremors can also affect the head, vocal cords, face, and other parts of the body. Essential tremor is a tremor without a known cause. What are the causes? Essential tremor has no known cause. What increases the risk? You may be at greater risk of essential tremor if:  You have a family member with essential tremor.  You are age 67 or older.  You take certain medicines. What are the signs or symptoms? The main sign of a tremor is uncontrolled and unintentional rhythmic shaking of a body part.  You may have difficulty eating with a spoon or fork.  You may have difficulty writing.  You may nod your head up and down or side to side.  You may have a quivering voice. Your tremors:  May get worse over time.  May come and go.  May be more noticeable on one side of your body.  May get worse due to stress, fatigue, caffeine, and extreme heat or cold. How is this diagnosed? Your health care provider can diagnose essential tremor based on your symptoms, medical history, and a physical examination. There is no single test to diagnose an essential tremor. However, your health care provider may perform a variety of tests to rule out other conditions. Tests may include:  Blood and urine tests.  Imaging studies of your brain, such as:  CT scan.  MRI.  A test that measures involuntary muscle movement (electromyogram). How is this treated? Your tremors may go away without treatment. Mild tremors may not need treatment if they do not affect your day-to-day life. Severe tremors may need to be treated using one or a combination of the following options:  Medicines. This may include medicine that is injected.  Lifestyle changes.  Physical therapy. Follow these instructions at home:  Take medicines only as directed by your health care provider.  Limit alcohol  intake to no more than 1 drink per day for nonpregnant women and 2 drinks per day for men. One drink equals 12 oz of beer, 5 oz of wine, or 1 oz of hard liquor.  Do not use any tobacco products, including cigarettes, chewing tobacco, or electronic cigarettes. If you need help quitting, ask your health care provider.  Take medicines only as directed by your health care provider.  Avoid extreme heat or cold.  Limit the amount of caffeine you consumeas directed by your health care provider.  Try to get eight hours of sleep each night.  Find ways to manage your stress, such as meditation or yoga.  Keep all follow-up visits as directed by your health care provider. This is important. This includes any physical therapy visits. Contact a health care provider if:  You experience any changes in the location or intensity of your tremors.  You start having a tremor after starting a new medicine.  You have tremor with other symptoms such as:  Numbness.  Tingling.  Pain.  Weakness.  Your tremor gets worse.  Your tremor interferes with your daily life. This information is not intended to replace advice given to you by your health care provider. Make sure you discuss any questions you have with your health care provider. Document Released: 07/03/2014 Document Revised: 11/18/2015 Document Reviewed: 12/08/2013  2017 [employer]

## 2017-07-24 NOTE — Progress Notes (Addendum)
Subjective:    Patient ID: Amy Moon, female    DOB: [birth year], 67 y.o.   MRN: [id number]  HPI:  Amy Moon is here to establish as a new pt.  She is a pleasant 67 year old female.  PMH:  1995 Herniated Disk Repair She developed severe dysphagia and G tube was placed 2012. She has tube removed 2017 Elevated BP, diffuse Arthritis, Bil upper extremity tremor, depression, chronic pain syndrome, and was previously wheelchair bound until about 10 months ago. She was seen by Neurologist 11/17 for evaluation of Parkinson's Disease, she was previously on Sinemet 50/200 Q6H for 10-12 years.  She weaned herself off Sinemet around 02/2016.   Neurology believed her tremor not to be r/t primary PD movement disorder and was trailed on Inderal- 60mg  daily.   She reports only taking the medication for one week b/c "it made me feel horrible". Neurology recommended pain management referral- [place] Controlled Substance Database reflects rx for Hydrocodone 10mg  in 3/18 and 1/18. Also reflects Methadone use throughout 2017 She has not had regular primary care in "many years" She denies tobacco/ETOH use She denies current depression or thoughts of harming herself/others. She does not drive and reports multiple falls/month, last one yesterday.  She ambulates at home with "leaning on furniture" and a cane. She reports chest pain described as "an elephant sitting on my chest" that occurs 1-2 times/week and last 4-48mins and will resolve "if I sit down and relax".  She denies personal or family hx of CAD or MI      Patient Care Team    Relationship Specialty Notifications Start End  Amy Moon, Berna Spare, NP PCP - General Family Medicine  07/05/17     Patient Active Problem List   Diagnosis Date Noted  . Wheelchair confinement 05/15/2016  . Severe obesity (BMI >= 40) ([place]) 05/15/2016  . Severe episode of recurrent major depressive disorder, without psychotic features ([place]) 05/15/2016  . Tremor of both hands  05/15/2016     Past Medical History:  Diagnosis Date  . Duodenal ulcer   . Hypertension   . Parkinson disease ([place])   . Parkinson's disease [facility])      Past Surgical History:  Procedure Laterality Date  . ABDOMINAL HYSTERECTOMY    . BACK SURGERY    . GASTROSTOMY       Family History  Problem Relation Age of Onset  . Cancer Mother        ovarian  . Hyperlipidemia Mother   . Alzheimer's disease Mother   . Arthritis Mother        rheumatoid  . Cancer Father        lung  . Parkinson's disease Father   . Alzheimer's disease Father   . ALS Sister   . Cancer Maternal Aunt        pancreatic, lung, liver,breast  . Cancer Maternal Uncle        pancreatic, liver, brain  . Heart attack Paternal Aunt   . Stroke Paternal Aunt   . Heart attack Paternal Uncle   . Stroke Maternal Grandmother   . Stroke Paternal Grandmother   . Heart attack Paternal Grandfather   . Arthritis Sister        rheumatoid  . Huntington's disease Daughter      Social History   Substance and Sexual Activity  Drug Use No     Social History   Substance and Sexual Activity  Alcohol Use No     Social  History   Tobacco Use  Smoking Status Former Smoker  . Packs/day: 1.00  . Years: 22.00  . Pack years: 22.00  . Types: Cigarettes  . Last attempt to quit: 06/26/1986  . Years since quitting: 31.1  Smokeless Tobacco Never Used     Outpatient Encounter Medications as of 07/30/2017  Medication Sig Note  . diphenhydrAMINE (BENADRYL) 50 MG tablet Take 50 mg by mouth at bedtime as needed for itching.   . [DISCONTINUED] acetaminophen (TYLENOL) 500 MG tablet Take 1,000 mg by mouth. 05/15/2016: Received from: [facility] Received Sig: Take 2 tablets (1,000 mg total) by mouth every 8 hours.  . [DISCONTINUED] propranolol ER (INDERAL LA) 60 MG 24 hr capsule Take 1 capsule (60 mg total) by mouth at bedtime.    No facility-administered encounter medications on file as of 07/30/2017.      Allergies: Nsaids; Indomethacin; Penicillin g; Pneumococcal vaccines; Scopolamine; Sinemet [carbidopa w-levodopa]; Tape; and Latex  Body mass index is 27.57 kg/m.  Blood pressure (!) 147/86, pulse (!) 102, height 5\' 1"  (1.549 m), weight 145 lb 14.4 oz (66.2 kg), SpO2 96 %.   Review of Systems  Constitutional: Positive for activity change, fatigue and fever. Negative for appetite change, chills, diaphoresis and unexpected weight change.  Respiratory: Positive for chest tightness. Negative for cough, shortness of breath, wheezing and stridor.   Cardiovascular: Positive for chest pain. Negative for palpitations and leg swelling.  Gastrointestinal: Negative for abdominal distention, abdominal pain, blood in stool, constipation, diarrhea, nausea and vomiting.  Endocrine: Negative for cold intolerance, heat intolerance, polydipsia, polyphagia and polyuria.  Genitourinary: Negative for difficulty urinating and flank pain.  Musculoskeletal: Positive for arthralgias, back pain, gait problem, joint swelling, myalgias, neck pain and neck stiffness.  Skin: Negative for color change, pallor, rash and wound.  Neurological: Positive for dizziness, tremors and weakness. Negative for speech difficulty.  Hematological: Does not bruise/bleed easily.  Psychiatric/Behavioral: Positive for sleep disturbance. Negative for decreased concentration, dysphoric mood, hallucinations, self-injury and suicidal ideas. The patient is not nervous/anxious and is not hyperactive.        Objective:   Physical Exam  Constitutional: She is oriented to person, place, and time. She appears well-developed and well-nourished. She appears distressed.  HENT:  Head: Normocephalic and atraumatic.  Right Ear: External ear normal.  Left Ear: External ear normal.  Eyes: Conjunctivae are normal. Pupils are equal, round, and reactive to light.  Cardiovascular: Regular rhythm, normal heart sounds and intact distal pulses.  Tachycardia present.  No murmur heard. Pulmonary/Chest: Effort normal and breath sounds normal. No respiratory distress. She has no wheezes. She has no rales. She exhibits no tenderness.  Neurological: She is alert and oriented to person, place, and time. She displays tremor.  Slow, unsteady gait- uses walls/furniture to steady herself  Skin: Skin is warm and dry. No rash noted. She is not diaphoretic. No erythema. No pallor.  Psychiatric: She has a normal mood and affect. Her behavior is normal. Judgment and thought content normal.          Assessment & Plan:   1. Healthcare maintenance   2. Estrogen deficiency   3. Screening for breast cancer   4. Encounter for hepatitis C screening test for low risk patient   5. Screening for colon cancer   6. Chest pain, unspecified type   7. Chronic pain syndrome   8. Parkinson's disease ([place])     Healthcare maintenance Increase water intake and follow heart healthy diet. EKG-  normal today in clinic. Referral to Pain Clinic. Please schedule fasting labs in the next week and a complete physical the following week.  Chronic pain syndrome Stretch daily and continue walk as tolerated. Referral to Pain Clinic placed.  Parkinson's disease ([place]) Previously treated with Sinemet, last use was 2017 She vehemently declined referral to Neurology   Chest pain EKG-NSR in clinic today  Pt was in the office today for 40+ minutes, with over 50% time spent in face to face counseling of various medical concerns and in coordination of care  FOLLOW-UP:  Return in about 4 weeks (around 08/27/2017) for CPE, Fasting Labs.

## 2017-07-30 ENCOUNTER — Ambulatory Visit (INDEPENDENT_AMBULATORY_CARE_PROVIDER_SITE_OTHER): Payer: Commercial Managed Care - HMO | Admitting: Adult Health

## 2017-07-30 ENCOUNTER — Encounter: Payer: Self-pay | Admitting: Adult Health

## 2017-07-30 VITALS — BP 147/86 | HR 102 | Ht 61.0 in | Wt 145.9 lb

## 2017-07-30 DIAGNOSIS — I10 Essential (primary) hypertension: Secondary | ICD-10-CM | POA: Diagnosis not present

## 2017-07-30 DIAGNOSIS — Z1159 Encounter for screening for other viral diseases: Secondary | ICD-10-CM | POA: Diagnosis not present

## 2017-07-30 DIAGNOSIS — R079 Chest pain, unspecified: Secondary | ICD-10-CM | POA: Diagnosis not present

## 2017-07-30 DIAGNOSIS — G894 Chronic pain syndrome: Secondary | ICD-10-CM

## 2017-07-30 DIAGNOSIS — R251 Tremor, unspecified: Secondary | ICD-10-CM | POA: Diagnosis not present

## 2017-07-30 DIAGNOSIS — Z1239 Encounter for other screening for malignant neoplasm of breast: Secondary | ICD-10-CM

## 2017-07-30 DIAGNOSIS — G20A1 Parkinson's disease without dyskinesia, without mention of fluctuations: Secondary | ICD-10-CM | POA: Insufficient documentation

## 2017-07-30 DIAGNOSIS — Z0000 Encounter for general adult medical examination without abnormal findings: Secondary | ICD-10-CM

## 2017-07-30 DIAGNOSIS — G20 Parkinson's disease: Secondary | ICD-10-CM | POA: Diagnosis not present

## 2017-07-30 DIAGNOSIS — Z1231 Encounter for screening mammogram for malignant neoplasm of breast: Secondary | ICD-10-CM

## 2017-07-30 DIAGNOSIS — Z1211 Encounter for screening for malignant neoplasm of colon: Secondary | ICD-10-CM

## 2017-07-30 DIAGNOSIS — E2839 Other primary ovarian failure: Secondary | ICD-10-CM | POA: Diagnosis not present

## 2017-07-30 HISTORY — DX: Chest pain, unspecified: R07.9

## 2017-07-30 HISTORY — DX: Encounter for general adult medical examination without abnormal findings: Z00.00

## 2017-07-30 HISTORY — DX: Chronic pain syndrome: G89.4

## 2017-07-30 LAB — EKG 12-LEAD

## 2017-07-30 NOTE — Assessment & Plan Note (Signed)
Declined referral to Neurology

## 2017-07-30 NOTE — Assessment & Plan Note (Signed)
Increase water intake and follow heart healthy diet. EKG- normal today in clinic. Referral to Pain Clinic. Please schedule fasting labs in the next week and a complete physical the following week.

## 2017-07-30 NOTE — Patient Instructions (Signed)
Heart-Healthy Eating Plan Many factors influence your heart health, including eating and exercise habits. Heart (coronary) risk increases with abnormal blood fat (lipid) levels. Heart-healthy meal planning includes limiting unhealthy fats, increasing healthy fats, and making other small dietary changes. This includes maintaining a healthy body weight to help keep lipid levels within a normal range. What is my plan? Your health care provider recommends that you:  Get no more than _25__% of the total calories in your daily diet from fat.  Limit your intake of saturated fat to less than __5___% of your total calories each day.  Limit the amount of cholesterol in your diet to less than __300__ mg per day.  What types of fat should I choose?  Choose healthy fats more often. Choose monounsaturated and polyunsaturated fats, such as olive oil and canola oil, flaxseeds, walnuts, almonds, and seeds.  Eat more omega-3 fats. Good choices include salmon, mackerel, sardines, tuna, flaxseed oil, and ground flaxseeds. Aim to eat fish at least two times each week.  Limit saturated fats. Saturated fats are primarily found in animal products, such as meats, butter, and cream. Plant sources of saturated fats include palm oil, palm kernel oil, and coconut oil.  Avoid foods with partially hydrogenated oils in them. These contain trans fats. Examples of foods that contain trans fats are stick margarine, some tub margarines, cookies, crackers, and other baked goods. What general guidelines do I need to follow?  Check food labels carefully to identify foods with trans fats or high amounts of saturated fat.  Fill one half of your plate with vegetables and green salads. Eat 4-5 servings of vegetables per day. A serving of vegetables equals 1 cup of raw leafy vegetables,  cup of raw or cooked cut-up vegetables, or  cup of vegetable juice.  Fill one fourth of your plate with whole grains. Look for the word "whole" as  the first word in the ingredient list.  Fill one fourth of your plate with lean protein foods.  Eat 4-5 servings of fruit per day. A serving of fruit equals one medium whole fruit,  cup of dried fruit,  cup of fresh, frozen, or canned fruit, or  cup of 100% fruit juice.  Eat more foods that contain soluble fiber. Examples of foods that contain this type of fiber are apples, broccoli, carrots, beans, peas, and barley. Aim to get 20-30 g of fiber per day.  Eat more home-cooked food and less restaurant, buffet, and fast food.  Limit or avoid alcohol.  Limit foods that are high in starch and sugar.  Avoid fried foods.  Cook foods by using methods other than frying. Baking, boiling, grilling, and broiling are all great options. Other fat-reducing suggestions include: ? Removing the skin from poultry. ? Removing all visible fats from meats. ? Skimming the fat off of stews, soups, and gravies before serving them. ? Steaming vegetables in water or broth.  Lose weight if you are overweight. Losing just 5-10% of your initial body weight can help your overall health and prevent diseases such as diabetes and heart disease.  Increase your consumption of nuts, legumes, and seeds to 4-5 servings per week. One serving of dried beans or legumes equals  cup after being cooked, one serving of nuts equals 1 ounces, and one serving of seeds equals  ounce or 1 tablespoon.  You may need to monitor your salt (sodium) intake, especially if you have high blood pressure. Talk with your health care provider or dietitian to get  more information about reducing sodium. What foods can I eat? Grains  Breads, including [place], white, pita, wheat, raisin, rye, oatmeal, and [place]. Tortillas that are neither fried nor made with lard or trans fat. Low-fat rolls, including hotdog and hamburger buns and English muffins. Biscuits. Muffins. Waffles. Pancakes. Light popcorn. Whole-grain cereals. Flatbread. Melba toast.  Pretzels. Breadsticks. Rusks. Low-fat snacks and crackers, including oyster, saltine, matzo, graham, animal, and rye. Rice and pasta, including brown rice and those that are made with whole wheat. Vegetables All vegetables. Fruits All fruits, but limit coconut. Meats and Other Protein Sources Lean, well-trimmed beef, veal, pork, and lamb. Chicken and [place] without skin. All fish and shellfish. Wild duck, rabbit, pheasant, and venison. Egg whites or low-cholesterol egg substitutes. Dried beans, peas, lentils, and tofu.Seeds and most nuts. Dairy Low-fat or nonfat cheeses, including ricotta, string, and mozzarella. Skim or 1% milk that is liquid, powdered, or evaporated. Buttermilk that is made with low-fat milk. Nonfat or low-fat yogurt. Beverages Mineral water. Diet carbonated beverages. Sweets and Desserts Sherbets and fruit ices. Honey, jam, marmalade, jelly, and syrups. Meringues and gelatins. Pure sugar candy, such as hard candy, jelly beans, gumdrops, mints, marshmallows, and small amounts of dark chocolate. [employer] Eat all sweets and desserts in moderation. Fats and Oils Nonhydrogenated (trans-free) margarines. Vegetable oils, including soybean, sesame, sunflower, olive, peanut, safflower, corn, canola, and cottonseed. Salad dressings or mayonnaise that are made with a vegetable oil. Limit added fats and oils that you use for cooking, baking, salads, and as spreads. Other Cocoa powder. Coffee and tea. All seasonings and condiments. The items listed above may not be a complete list of recommended foods or beverages. Contact your dietitian for more options. What foods are not recommended? Grains Breads that are made with saturated or trans fats, oils, or whole milk. Croissants. Butter rolls. Cheese breads. Sweet rolls. Donuts. Buttered popcorn. Chow mein noodles. High-fat crackers, such as cheese or butter crackers. Meats and Other Protein Sources Fatty meats, such as hotdogs,  short ribs, sausage, spareribs, bacon, ribeye roast or steak, and mutton. High-fat deli meats, such as salami and bologna. Caviar. Domestic duck and goose. Organ meats, such as kidney, liver, sweetbreads, brains, gizzard, chitterlings, and heart. Dairy Cream, sour cream, cream cheese, and creamed cottage cheese. Whole milk cheeses, including blue (bleu), Monterey Jack, [place], [place], American, [place], Swiss, [place], [place], and [place]. Whole or 2% milk that is liquid, evaporated, or condensed. Whole buttermilk. Cream sauce or high-fat cheese sauce. Yogurt that is made from whole milk. Beverages Regular sodas and drinks with added sugar. Sweets and Desserts Frosting. Pudding. Cookies. Cakes other than angel food cake. Candy that has milk chocolate or white chocolate, hydrogenated fat, butter, coconut, or unknown ingredients. Buttered syrups. Full-fat ice cream or ice cream drinks. Fats and Oils Gravy that has suet, meat fat, or shortening. Cocoa butter, hydrogenated oils, palm oil, coconut oil, palm kernel oil. These can often be found in baked products, candy, fried foods, nondairy creamers, and whipped toppings. Solid fats and shortenings, including bacon fat, salt pork, lard, and butter. Nondairy cream substitutes, such as coffee creamers and sour cream substitutes. Salad dressings that are made of unknown oils, cheese, or sour cream. The items listed above may not be a complete list of foods and beverages to avoid. Contact your dietitian for more information. This information is not intended to replace advice given to you by your health care provider. Make sure you discuss any questions you have with your health care  provider. Document Released: 03/21/2008 Document Revised: 12/31/2015 Document Reviewed: 12/04/2013 Elsevier Interactive Patient Education  2018 [employer].   Chronic Pain, Adult Chronic pain is a type of pain that lasts or keeps coming back (recurs) for at least six months.  You may have chronic headaches, abdominal pain, or body pain. Chronic pain may be related to an illness, such as fibromyalgia or complex regional pain syndrome. Sometimes the cause of chronic pain is not known. Chronic pain can make it hard for you to do daily activities. If not treated, chronic pain can lead to other health problems, including anxiety and depression. Treatment depends on the cause and severity of your pain. You may need to work with a pain specialist to come up with a treatment plan. The plan may include medicine, counseling, and physical therapy. Many people benefit from a combination of two or more types of treatment to control their pain. Follow these instructions at home: Lifestyle  Consider keeping a pain diary to share with your health care providers.  Consider talking with a mental health care provider (psychologist) about how to cope with chronic pain.  Consider joining a chronic pain support group.  Try to control or lower your stress levels. Talk to your health care provider about strategies to do this. General instructions   Take over-the-counter and prescription medicines only as told by your health care provider.  Follow your treatment plan as told by your health care provider. This may include: ? Gentle, regular exercise. ? Eating a healthy diet that includes foods such as vegetables, fruits, fish, and lean meats. ? Cognitive or behavioral therapy. ? Working with a Community education officer. ? Meditation or yoga. ? Acupuncture or massage therapy. ? Aroma, color, light, or sound therapy. ? Local electrical stimulation. ? Shots (injections) of numbing or pain-relieving medicines into the spine or the area of pain.  Check your pain level as told by your health care provider. Ask your health care provider if you should use a pain scale.  Learn as much as you can about how to manage your chronic pain. Ask your health care provider if an intensive pain rehabilitation  program or a chronic pain specialist would be helpful.  Keep all follow-up visits as told by your health care provider. This is important. Contact a health care provider if:  Your pain gets worse.  You have new pain.  You have trouble sleeping.  You have trouble doing your normal activities.  Your pain is not controlled with treatment.  Your have side effects from pain medicine.  You feel weak. Get help right away if:  You lose feeling or have numbness in your body.  You lose control of bowel or bladder function.  Your pain suddenly gets much worse.  You develop shaking or chills.  You develop confusion.  You develop chest pain.  You have trouble breathing or shortness of breath.  You pass out.  You have thoughts about hurting yourself or others. This information is not intended to replace advice given to you by your health care provider. Make sure you discuss any questions you have with your health care provider. Document Released: 03/04/2002 Document Revised: 02/10/2016 Document Reviewed: 11/30/2015 Elsevier Interactive Patient Education  2018 [employer].  Tremor A tremor is trembling or shaking that you cannot control. Most tremors affect the hands or arms. Tremors can also affect the head, vocal cords, face, and other parts of the body. There are many types of tremors. Common types include:  Essential tremor. These usually occur in people over the age of 73. It may run in families and can happen in otherwise healthy people.  Resting tremor. These occur when the muscles are at rest, such as when your hands are resting in your lap. People with Parkinson disease often have resting tremors.  Postural tremor. These occur when you try to hold a pose, such as keeping your hands outstretched.  Kinetic tremor. These occur during purposeful movement, such as trying to touch a finger to your nose.  Task-specific tremor. These may occur when you perform tasks such as  handwriting, speaking, or standing.  Psychogenic tremor. These dramatically lessen or disappear when you are distracted. They can happen in people of all ages.  Some types of tremors have no known cause. Tremors can also be a symptom of nervous system problems (neurological disorders) that may occur with aging. Some tremors go away with treatment while others do not. Follow these instructions at home: Watch your tremor for any changes. The following actions may help to lessen any discomfort you are feeling:  Take medicines only as directed by your health care provider.  Limit alcohol intake to no more than 1 drink per day for nonpregnant women and 2 drinks per day for men. One drink equals 12 oz of beer, 5 oz of wine, or 1 oz of hard liquor.  Do not use any tobacco products, including cigarettes, chewing tobacco, or electronic cigarettes. If you need help quitting, ask your health care provider.  Avoid extreme heat or cold.  Limit the amount of caffeine you consumeas directed by your health care provider.  Try to get 8 hours of sleep each night.  Find ways to manage your stress, such as meditation or yoga.  Keep all follow-up visits as directed by your health care provider. This is important.  Contact a health care provider if:  You start having a tremor after starting a new medicine.  You have tremor with other symptoms such as: ? Numbness. ? Tingling. ? Pain. ? Weakness.  Your tremor gets worse.  Your tremor interferes with your day-to-day life. This information is not intended to replace advice given to you by your health care provider. Make sure you discuss any questions you have with your health care provider. Document Released: 06/02/2002 Document Revised: 02/13/2016 Document Reviewed: 12/08/2013 Elsevier Interactive Patient Education  2018 [employer].  Increase water intake and follow heart healthy diet. EKG- normal today in clinic. Referral to Pain  Clinic. Please schedule fasting labs in the next week and a complete physical the following week. WELCOME TO THE PRACTICE!

## 2017-07-30 NOTE — Assessment & Plan Note (Signed)
Previously treated with Sinemet, last use was 2017 She vehemently declined referral to Neurology

## 2017-07-30 NOTE — Assessment & Plan Note (Addendum)
Stretch daily and continue walking as tolerated. Referral to Pain Clinic placed.

## 2017-07-30 NOTE — Assessment & Plan Note (Signed)
EKG-NSR in clinic today

## 2017-08-06 ENCOUNTER — Other Ambulatory Visit: Payer: Medicare HMO

## 2017-08-06 DIAGNOSIS — E2839 Other primary ovarian failure: Secondary | ICD-10-CM

## 2017-08-06 DIAGNOSIS — Z0000 Encounter for general adult medical examination without abnormal findings: Secondary | ICD-10-CM

## 2017-08-07 LAB — CBC WITH DIFFERENTIAL/PLATELET
BASOS ABS: 0 10*3/uL (ref 0.0–0.2)
Basos: 1 %
EOS (ABSOLUTE): 0.2 10*3/uL (ref 0.0–0.4)
Eos: 4 %
Hematocrit: 37.4 % (ref 34.0–46.6)
Hemoglobin: 13.2 g/dL (ref 11.1–15.9)
IMMATURE GRANS (ABS): 0 10*3/uL (ref 0.0–0.1)
IMMATURE GRANULOCYTES: 0 %
LYMPHS: 34 %
Lymphocytes Absolute: 1.6 10*3/uL (ref 0.7–3.1)
MCH: 32.9 pg (ref 26.6–33.0)
MCHC: 35.3 g/dL (ref 31.5–35.7)
MCV: 93 fL (ref 79–97)
MONOS ABS: 0.3 10*3/uL (ref 0.1–0.9)
Monocytes: 7 %
NEUTROS PCT: 54 %
Neutrophils Absolute: 2.6 10*3/uL (ref 1.4–7.0)
PLATELETS: 134 10*3/uL — AB (ref 150–379)
RBC: 4.01 x10E6/uL (ref 3.77–5.28)
RDW: 13.3 % (ref 12.3–15.4)
WBC: 4.6 10*3/uL (ref 3.4–10.8)

## 2017-08-07 LAB — LIPID PANEL
CHOL/HDL RATIO: 2.6 ratio (ref 0.0–4.4)
Cholesterol, Total: 164 mg/dL (ref 100–199)
HDL: 64 mg/dL (ref >39)
LDL Calculated: 86 mg/dL (ref 0–99)
Triglycerides: 72 mg/dL (ref 0–149)
VLDL CHOLESTEROL CAL: 14 mg/dL (ref 5–40)

## 2017-08-07 LAB — COMPREHENSIVE METABOLIC PANEL
A/G RATIO: 1.6 (ref 1.2–2.2)
ALT: 17 IU/L (ref 0–32)
AST: 17 IU/L (ref 0–40)
Albumin: 4.6 g/dL (ref 3.6–4.8)
Alkaline Phosphatase: 94 IU/L (ref 39–117)
BUN/Creatinine Ratio: 12 (ref 12–28)
BUN: 7 mg/dL — ABNORMAL LOW (ref 8–27)
Bilirubin Total: 0.9 mg/dL (ref 0.0–1.2)
CALCIUM: 10 mg/dL (ref 8.7–10.3)
CHLORIDE: 104 mmol/L (ref 96–106)
CO2: 24 mmol/L (ref 20–29)
Creatinine, Ser: 0.6 mg/dL (ref 0.57–1.00)
GFR calc Af Amer: 108 mL/min/{1.73_m2} (ref >59)
GFR, EST NON AFRICAN AMERICAN: 93 mL/min/{1.73_m2} (ref >59)
GLOBULIN, TOTAL: 2.9 g/dL (ref 1.5–4.5)
Glucose: 104 mg/dL — ABNORMAL HIGH (ref 65–99)
POTASSIUM: 4.2 mmol/L (ref 3.5–5.2)
SODIUM: 143 mmol/L (ref 134–144)
Total Protein: 7.5 g/dL (ref 6.0–8.5)

## 2017-08-07 LAB — TSH: TSH: 0.375 u[IU]/mL — AB (ref 0.450–4.500)

## 2017-08-07 LAB — HEMOGLOBIN A1C
ESTIMATED AVERAGE GLUCOSE: 85 mg/dL
HEMOGLOBIN A1C: 4.6 % — AB (ref 4.8–5.6)

## 2017-08-07 LAB — VITAMIN D 25 HYDROXY (VIT D DEFICIENCY, FRACTURES): VIT D 25 HYDROXY: 14.2 ng/mL — AB (ref 30.0–100.0)

## 2017-08-08 ENCOUNTER — Other Ambulatory Visit: Payer: Self-pay | Admitting: Adult Health

## 2017-08-08 DIAGNOSIS — Z1239 Encounter for other screening for malignant neoplasm of breast: Secondary | ICD-10-CM

## 2017-08-08 DIAGNOSIS — E559 Vitamin D deficiency, unspecified: Secondary | ICD-10-CM

## 2017-08-08 DIAGNOSIS — Z0000 Encounter for general adult medical examination without abnormal findings: Secondary | ICD-10-CM

## 2017-08-08 DIAGNOSIS — E2839 Other primary ovarian failure: Secondary | ICD-10-CM

## 2017-08-08 MED ORDER — VITAMIN D (ERGOCALCIFEROL) 1.25 MG (50000 UNIT) PO CAPS: 50000.0000 [IU] | ORAL_CAPSULE | ORAL | 0 refills | Status: DC

## 2017-08-10 ENCOUNTER — Other Ambulatory Visit: Payer: Self-pay

## 2017-08-10 DIAGNOSIS — Z9181 History of falling: Secondary | ICD-10-CM

## 2017-08-10 NOTE — Progress Notes (Unsigned)
Received fax from [facility] that pt declined to schedule an appointment.  Charyl Bigger, CMA

## 2017-08-10 NOTE — Progress Notes (Signed)
amb  

## 2017-08-13 LAB — T4, FREE: Free T4: 1.01 ng/dL (ref 0.82–1.77)

## 2017-08-13 LAB — T3: T3, Total: 106 ng/dL (ref 71–180)

## 2017-08-13 LAB — SPECIMEN STATUS REPORT

## 2017-08-22 ENCOUNTER — Inpatient Hospital Stay: Admission: RE | Admit: 2017-08-22 | Payer: Commercial Managed Care - HMO | Source: Ambulatory Visit

## 2017-08-22 ENCOUNTER — Ambulatory Visit: Payer: Commercial Managed Care - HMO

## 2017-08-29 ENCOUNTER — Telehealth: Payer: Self-pay | Admitting: Adult Health

## 2017-08-29 ENCOUNTER — Ambulatory Visit
Admission: RE | Admit: 2017-08-29 | Discharge: 2017-08-29 | Disposition: A | Discharge status: Home / Self Care | Payer: Medicare HMO | Source: Ambulatory Visit | Attending: Adult Health | Admitting: Adult Health

## 2017-08-30 NOTE — Progress Notes (Addendum)
Subjective:    Patient ID: Amy Moon, female    DOB: 1951-05-19, 67 y.o.   MRN: [id number]  HPI:  07/30/17 OV: Amy Moon is here to establish as a new pt.  She is a pleasant 67 year old female.  PMH:  1995 Herniated Disk Repair She developed severe dysphagia and G tube was placed 2012. She has tube removed 2017 Elevated BP, diffuse Arthritis, Bil upper extremity tremor, depression, chronic pain syndrome, and was previously wheelchair bound until about 10 months ago. She was seen by Neurologist 11/17 for evaluation of Parkinson's Disease, she was previously on Sinemet 50/200 Q6H for 10-12 years.  She weaned herself off Sinemet around 02/2016.   Neurology believed her tremor not to be r/t primary PD movement disorder and was trailed on Inderal- 60mg  daily.   She reports only taking the medication for one week b/c "it made me feel horrible". Neurology recommended pain management referral- [place] Controlled Substance Database reflects rx for Hydrocodone 10mg  in 3/18 and 1/18. Also reflects Methadone use throughout 2017 She has not had regular primary care in "many years" She denies tobacco/ETOH use She denies current depression or thoughts of harming herself/others. She does not drive and reports multiple falls/month, last one yesterday.  She ambulates at home with "leaning on furniture" and a cane. She reports chest pain described as "an elephant sitting on my chest" that occurs 1-2 times/week and last 4-53mins and will resolve "if I sit down and relax".  She denies personal or family hx of CAD or MI   08/30/17 OV: Amy Moon presents for f/u for several issues: 1) Increased CP- now occurs daily and worsens with exertion (ie. Walking).  She initially denied family hx of MI/CAD, however today reports that her father had MI in mid 76s. EKG 07/30/17- NSR EKG today- NSR, no acute changes in tracing from previous imaging  2)She also reports dysphagia worsening the last 6 months,  will get "mildly choked  up" a few times a week.   3) She continues to experience chronic neck/bil shoulder pain.  She has been using heating pad to treat sx's.  Pain Clinic called her to schedule initial appt- advised to at least be seen/evaluated. We reviewed recent labs at length.  Her plt's have ben running low since May 2017, will repreat CBC at next OV  Patient Care Team    Relationship Specialty Notifications Start End  [name], NP PCP - General Family Medicine  07/05/17     Patient Active Problem List   Diagnosis Date Noted  . Dysphagia 09/03/2017  . Thrombocytopenia ([place]) 09/03/2017  . Healthcare maintenance 07/30/2017  . Chronic pain syndrome 07/30/2017  . Chest pain 07/30/2017  . Parkinson's disease ([place]) 07/30/2017  . Hypertension 07/30/2017  . Severe obesity (BMI >= 40) ([place]) 05/15/2016  . Severe episode of recurrent major depressive disorder, without psychotic features ([place]) 05/15/2016  . Tremor of both hands 05/15/2016     Past Medical History:  Diagnosis Date  . Duodenal ulcer   . Hypertension   . Parkinson disease ([place])   . Parkinson's disease [facility])      Past Surgical History:  Procedure Laterality Date  . ABDOMINAL HYSTERECTOMY    . BACK SURGERY    . GASTROSTOMY       Family History  Problem Relation Age of Onset  . Cancer Mother        ovarian  . Hyperlipidemia Mother   . Alzheimer's disease Mother   .  Arthritis Mother        rheumatoid  . Cancer Father        lung  . Parkinson's disease Father   . Alzheimer's disease Father   . ALS Sister   . Cancer Maternal Aunt        pancreatic, lung, liver,breast  . Cancer Maternal Uncle        pancreatic, liver, brain  . Heart attack Paternal Aunt   . Stroke Paternal Aunt   . Heart attack Paternal Uncle   . Stroke Maternal Grandmother   . Stroke Paternal Grandmother   . Heart attack Paternal Grandfather   . Arthritis Sister        rheumatoid  . Huntington's disease Daughter      Social History   Substance  and Sexual Activity  Drug Use No     Social History   Substance and Sexual Activity  Alcohol Use No     Social History   Tobacco Use  Smoking Status Former Smoker  . Packs/day: 1.00  . Years: 22.00  . Pack years: 22.00  . Types: Cigarettes  . Last attempt to quit: 06/26/1986  . Years since quitting: 31.2  Smokeless Tobacco Never Used     Outpatient Encounter Medications as of 09/03/2017  Medication Sig  . diphenhydrAMINE (BENADRYL) 50 MG tablet Take 50 mg by mouth at bedtime as needed for itching.  . Vitamin D, Ergocalciferol, (DRISDOL) 50000 units CAPS capsule Take 1 capsule (50,000 Units total) by mouth every 7 (seven) days.   No facility-administered encounter medications on file as of 09/03/2017.     Allergies: Nsaids; Indomethacin; Penicillin g; Pneumococcal vaccines; Scopolamine; Sinemet [carbidopa w-levodopa]; Tape; and Latex  Body mass index is 27.68 kg/m.  Blood pressure 137/71, pulse 88, height 5\' 1"  (1.549 m), weight 146 lb 8 oz (66.5 kg), SpO2 98 %.   Review of Systems  Constitutional: Positive for activity change, fatigue and fever. Negative for appetite change, chills, diaphoresis and unexpected weight change.  Respiratory: Positive for chest tightness. Negative for cough, shortness of breath, wheezing and stridor.   Cardiovascular: Positive for chest pain. Negative for palpitations and leg swelling.  Gastrointestinal: Negative for abdominal distention, abdominal pain, blood in stool, constipation, diarrhea, nausea and vomiting.  Endocrine: Negative for cold intolerance, heat intolerance, polydipsia, polyphagia and polyuria.  Genitourinary: Negative for difficulty urinating and flank pain.  Musculoskeletal: Positive for arthralgias, back pain, gait problem, joint swelling, myalgias, neck pain and neck stiffness.  Skin: Negative for color change, pallor, rash and wound.  Neurological: Positive for dizziness, tremors and weakness. Negative for speech  difficulty.  Hematological: Does not bruise/bleed easily.  Psychiatric/Behavioral: Positive for sleep disturbance. Negative for decreased concentration, dysphoric mood, hallucinations, self-injury and suicidal ideas. The patient is not nervous/anxious and is not hyperactive.        Objective:   Physical Exam  Constitutional: She is oriented to person, place, and time. She appears well-developed and well-nourished. No distress.  HENT:  Head: Normocephalic and atraumatic.  Right Ear: External ear normal.  Left Ear: External ear normal.  Eyes: Conjunctivae are normal. Pupils are equal, round, and reactive to light.  Cardiovascular: Regular rhythm, normal heart sounds and intact distal pulses. Tachycardia present.  No murmur heard. Pulmonary/Chest: Effort normal and breath sounds normal. No respiratory distress. She has no wheezes. She has no rales. She exhibits no tenderness.  Neurological: She is alert and oriented to person, place, and time. She displays tremor.  Slow, unsteady gait- uses  walls/furniture to steady herself  Skin: Skin is warm and dry. No rash noted. She is not diaphoretic. No erythema. No pallor.  Psychiatric: She has a normal mood and affect. Her behavior is normal. Judgment and thought content normal.          Assessment & Plan:   1. Parkinson's disease ([place])   2. Tremor of both hands   3. Chest pain, unspecified type   4. Dysphagia, unspecified type   5. Thrombocytopenia ([place])     Dysphagia She developed severe dysphagia and G tube was placed 2012. She has tube removed 2017 Dysphagia frequency/severity the last few months GI referral placed  Parkinson's disease [facility]) Referral to [facility] Neurology placed She vehemently declines to be seen by [facility] She believes Parkinson's Disease was initially dx'd around 2000 She has previously been on Sinemet andn Inderal.   Not currently on any neuro meds  Thrombocytopenia ([place]) Low plt  since May 2017 08/06/17- 134 Will repeat CBC in 6 weeks   Chest pain Increase in sx frequency/severity EKG today- NSR , no change from tracing Feb 2019 Cards referral placed   Pt was in the office today for 40+ minutes, with over 50% time spent in face to face counseling of various medical concerns and in coordination of care  FOLLOW-UP:  Return in about 6 weeks (around 10/15/2017).

## 2017-09-03 ENCOUNTER — Encounter: Payer: Self-pay | Admitting: Adult Health

## 2017-09-03 ENCOUNTER — Ambulatory Visit (INDEPENDENT_AMBULATORY_CARE_PROVIDER_SITE_OTHER): Payer: Medicare HMO | Admitting: Adult Health

## 2017-09-03 VITALS — BP 137/71 | HR 88 | Ht 61.0 in | Wt 146.5 lb

## 2017-09-03 DIAGNOSIS — R079 Chest pain, unspecified: Secondary | ICD-10-CM

## 2017-09-03 DIAGNOSIS — R251 Tremor, unspecified: Secondary | ICD-10-CM

## 2017-09-03 DIAGNOSIS — G20 Parkinson's disease: Secondary | ICD-10-CM

## 2017-09-03 DIAGNOSIS — D696 Thrombocytopenia, unspecified: Secondary | ICD-10-CM | POA: Diagnosis not present

## 2017-09-03 DIAGNOSIS — R1310 Dysphagia, unspecified: Secondary | ICD-10-CM

## 2017-09-03 HISTORY — DX: Dysphagia, unspecified: R13.10

## 2017-09-03 HISTORY — DX: Thrombocytopenia, unspecified: D69.6

## 2017-09-03 NOTE — Assessment & Plan Note (Signed)
Increase in sx frequency/severity EKG today- NSR , no change from tracing Feb 2019 Cards referral placed

## 2017-09-03 NOTE — Assessment & Plan Note (Signed)
She developed severe dysphagia and G tube was placed 2012. She has tube removed 2017 Dysphagia frequency/severity the last few months GI referral placed

## 2017-09-03 NOTE — Addendum Note (Signed)
Addended by: Mina Marble D on: 09/03/2017 03:01 PM   Modules accepted: Orders, Level of Service

## 2017-09-03 NOTE — Assessment & Plan Note (Signed)
Low plt since May 2017 08/06/17- 134 Will repeat CBC in 6 weeks

## 2017-09-03 NOTE — Assessment & Plan Note (Signed)
Referral to [facility] Neurology placed She vehemently declines to be seen by [place] [facility] She believes Parkinson's Disease was initially dx'd around 2000 She has previously been on Sinemet andn Inderal.   Not currently on any neuro meds

## 2017-09-03 NOTE — Patient Instructions (Addendum)
Angina Pectoris Angina pectoris is a very bad feeling in the chest, neck, or arm. Your doctor may call it angina. There are four types of angina. Angina is caused by a lack of blood in the middle and thickest layer of the heart wall (myocardium). Angina may feel like a crushing or squeezing pain in the chest. It may feel like tightness or heavy pressure in the chest. Some people say it feels like gas, heartburn, or indigestion. Some people have symptoms other than pain. These include:  Shortness of breath.  Cold sweats.  Feeling sick to your stomach (nausea).  Feeling light-headed.  Many women have chest discomfort and some of the other symptoms. However, women often have different symptoms, such as:  Feeling tired (fatigue).  Feeling nervous for no reason.  Feeling weak for no reason.  Dizziness or fainting.  Women may have angina without any symptoms. Follow these instructions at home:  Take medicines only as told by your doctor.  Take care of other health issues as told by your doctor. These include: ? High blood pressure (hypertension). ? Diabetes.  Follow a heart-healthy diet. Your doctor can help you to choose healthy food options and make changes.  Talk to your doctor to learn more about healthy cooking methods and use them. These include: ? Roasting. ? Grilling. ? Broiling. ? Baking. ? Poaching. ? Steaming. ? Stir-frying.  Follow an exercise program approved by your doctor.  Keep a healthy weight. Lose weight as told by your doctor.  Rest when you are tired.  Learn to manage stress.  Do not use any tobacco, such as cigarettes, chewing tobacco, or electronic cigarettes. If you need help quitting, ask your doctor.  If you drink alcohol, and your doctor says it is okay, limit yourself to no more than 1 drink per day. One drink equals 12 ounces of beer, 5 ounces of wine, or 1 ounces of hard liquor.  Stop illegal drug use.  Keep all follow-up visits as told  by your doctor. This is important. Do not take these medicines unless your doctor says that you can:  Nonsteroidal anti-inflammatory drugs (NSAIDs). These include: ? Ibuprofen. ? Naproxen. ? Celecoxib.  Vitamin supplements that have vitamin A, vitamin E, or both.  Hormone therapy that contains estrogen with or without progestin.  Get help right away if:  You have pain in your chest, neck, arm, jaw, stomach, or back that: ? Lasts more than a few minutes. ? Comes back. ? Does not get better after you take medicine under your tongue (sublingual nitroglycerin).  You have any of these symptoms for no reason: ? Gas, heartburn, or indigestion. ? Sweating a lot. ? Shortness of breath or trouble breathing. ? Feeling sick to your stomach or throwing up. ? Feeling more tired than usual. ? Feeling nervous or worrying more than usual. ? Feeling weak. ? Diarrhea.  You are suddenly dizzy or light-headed.  You faint or pass out. These symptoms may be an emergency. Do not wait to see if the symptoms will go away. Get medical help right away. Call your local emergency services (911 in the [place] Do not drive yourself to the hospital. This information is not intended to replace advice given to you by your health care provider. Make sure you discuss any questions you have with your health care provider. Document Released: 11/29/2007 Document Revised: 11/18/2015 Document Reviewed: 10/14/2013 Elsevier Interactive Patient Education  2017 Elsevier Inc.   Dysphagia Dysphagia is trouble swallowing. This condition occurs  when solids and liquids stick in a person's throat on the way down to the stomach, or when food takes longer to get to the stomach. You may have problems swallowing food, liquids, or both. You may also have pain while trying to swallow. It may take you more time and effort to swallow something. What are the causes? This condition is caused by:  Problems with the muscles. They may  make it difficult for you to move food and liquids through the tube that connects your mouth to your stomach (esophagus). You may have ulcers, scar tissue, or inflammation that blocks the normal passage of food and liquids. Causes of these problems include: ? Acid reflux from your stomach into your esophagus (gastroesophageal reflux). ? Infections. ? Radiation treatment for cancer. ? Medicines taken without enough fluids to wash them down into your stomach.  Nerve problems. These prevent signals from being sent to the muscles of your esophagus to squeeze (contract) and move what you swallow down to your stomach.  Globus pharyngeus. This is a common problem that involves feeling like something is stuck in the throat or a sense of trouble with swallowing even though nothing is wrong with the swallowing passages.  Stroke. This can affect the nerves and make it difficult to swallow.  Certain conditions, such as cerebral palsy or Parkinson disease.  What are the signs or symptoms? Common symptoms of this condition include:  A feeling that solids or liquids are stuck in your throat on the way down to the stomach.  Food taking too long to get to the stomach.  Other symptoms include:  Food moving back from your stomach to your mouth (regurgitation).  Noises coming from your throat.  Chest discomfort with swallowing.  A feeling of fullness when swallowing.  Drooling, especially when the throat is blocked.  Pain while swallowing.  Heartburn.  Coughing or gagging while trying to swallow.  How is this diagnosed? This condition is diagnosed by:  Barium X-ray. In this test, you swallow a white substance (contrast medium)that sticks to the inside of your esophagus. X-ray images are then taken.  Endoscopy. In this test, a flexible telescope is inserted down your throat to look at your esophagus and your stomach.  CT scans and MRI.  How is this treated? Treatment for dysphagia depends  on the cause of the condition:  If the dysphagia is caused by acid reflux or infection, medicines may be used. They may include antibiotics and heartburn medicines.  If the dysphagia is caused by problems with your muscles, swallowing therapy may be used to help you strengthen your swallowing muscles. You may have to do specific exercises to strengthen the muscles or stretch them.  If the dysphagia is caused by a blockage or mass, procedures to remove the blockage may be done. You may need surgery and a feeding tube.  You may need to make diet changes. Ask your health care provider for specific instructions. Follow these instructions at home: Eating and drinking  Try to eat soft food that is easier to swallow.  Follow any diet changes as told by your health care provider.  Cut your food into small pieces and eat slowly.  Eat and drink only when you are sitting upright.  Do not drink alcohol or caffeine. If you need help quitting, ask your health care provider. General instructions  Check your weight every day to make sure you are not losing weight.  Take over-the-counter and prescription medicines only as told by  your health care provider.  If you were prescribed an antibiotic medicine, take it as told by your health care provider. Do not stop taking the antibiotic even if you start to feel better.  Do not use any products that contain nicotine or tobacco, such as cigarettes and e-cigarettes. If you need help quitting, ask your health care provider.  Keep all follow-up visits as told by your health care provider. This is important. Contact a health care provider if:  You lose weight because you cannot swallow.  You cough when you drink liquids (aspiration).  You cough up partially digested food. Get help right away if:  You cannot swallow your saliva.  You have shortness of breath or a fever, or both.  You have a hoarse voice and also have trouble  swallowing. Summary  Dysphagia is trouble swallowing. This condition occurs when solids and liquids stick in a person's throat on the way down to the stomach, or when food takes longer to get to the stomach.  Dysphagia has many possible causes and symptoms.  Treatment for dysphagia depends on the cause of the condition. This information is not intended to replace advice given to you by your health care provider. Make sure you discuss any questions you have with your health care provider. Document Released: 06/09/2000 Document Revised: 06/01/2016 Document Reviewed: 06/01/2016 Elsevier Interactive Patient Education  2017 Elsevier Inc.  EKG has not changed from the one taken last month. Since chest discomfort has been increasing, referral to Cardiology placed. Referral to [facility], re: Parkinson's Disease. Difficulty swallowing needs to be addressed by Gastroenterologist, referral placed. Recommend to make appt with Pain Management to address diffuse, chronic pain. Please schedule follow-up appt in 4-6 weeks and will repeat  CBC, re: low platelet level. Recommend Medicare Wellness visit this summer to update health maintenance. NICE TO SEE YOU!

## 2017-09-06 ENCOUNTER — Ambulatory Visit (INDEPENDENT_AMBULATORY_CARE_PROVIDER_SITE_OTHER): Payer: Medicare Other | Admitting: Specialist

## 2017-09-12 ENCOUNTER — Ambulatory Visit: Payer: Commercial Managed Care - HMO

## 2017-09-27 ENCOUNTER — Encounter: Payer: Self-pay | Admitting: Adult Health

## 2017-10-03 ENCOUNTER — Ambulatory Visit (INDEPENDENT_AMBULATORY_CARE_PROVIDER_SITE_OTHER): Payer: Medicare HMO

## 2017-10-03 ENCOUNTER — Ambulatory Visit (INDEPENDENT_AMBULATORY_CARE_PROVIDER_SITE_OTHER): Payer: Medicare HMO | Admitting: Specialist

## 2017-10-03 ENCOUNTER — Encounter (INDEPENDENT_AMBULATORY_CARE_PROVIDER_SITE_OTHER): Payer: Self-pay | Admitting: Specialist

## 2017-10-03 ENCOUNTER — Encounter: Payer: Self-pay | Admitting: Neurology

## 2017-10-03 VITALS — BP 172/79 | HR 86 | Ht 61.0 in | Wt 146.0 lb

## 2017-10-03 DIAGNOSIS — M4722 Other spondylosis with radiculopathy, cervical region: Secondary | ICD-10-CM

## 2017-10-03 DIAGNOSIS — G20 Parkinson's disease: Secondary | ICD-10-CM

## 2017-10-03 DIAGNOSIS — M25551 Pain in right hip: Secondary | ICD-10-CM

## 2017-10-03 DIAGNOSIS — M545 Low back pain: Secondary | ICD-10-CM

## 2017-10-03 DIAGNOSIS — G8929 Other chronic pain: Secondary | ICD-10-CM

## 2017-10-03 DIAGNOSIS — R29898 Other symptoms and signs involving the musculoskeletal system: Secondary | ICD-10-CM | POA: Diagnosis not present

## 2017-10-03 DIAGNOSIS — M5136 Other intervertebral disc degeneration, lumbar region: Secondary | ICD-10-CM

## 2017-10-03 DIAGNOSIS — M542 Cervicalgia: Secondary | ICD-10-CM | POA: Diagnosis not present

## 2017-10-03 DIAGNOSIS — M4804 Spinal stenosis, thoracic region: Secondary | ICD-10-CM

## 2017-10-03 DIAGNOSIS — M160 Bilateral primary osteoarthritis of hip: Secondary | ICD-10-CM | POA: Diagnosis not present

## 2017-10-03 MED ORDER — GABAPENTIN 100 MG PO CAPS: 100.0000 mg | ORAL_CAPSULE | Freq: Every day | ORAL | 3 refills | Status: DC

## 2017-10-03 MED ORDER — PREDNISONE 5 MG PO TABS: ORAL_TABLET | ORAL | 0 refills | Status: AC

## 2017-10-03 NOTE — Patient Instructions (Addendum)
Avoid overhead lifting and overhead use of the arms. Do not lift greater than 5 lbs. Adjust head rest in vehicle to prevent hyperextension if rear ended. Take extra precautions to avoid falling, including use of a cane if you feel weak. Avoid bending, stooping and avoid lifting weights greater than 10 lbs. Avoid prolong standing and walking. Avoid frequent bending and stooping  No lifting greater than 10 lbs. May use ice or moist heat for pain. Weight loss is of benefit. Handicap license is approved. Referral to neurology for evaluation.  MRI of cervical and thoracic spine.  Try a course of low dose steroid due to history of ulcer disease. Try a course of low dose neurontin for neurogenic pain.  Obtain lab tests to rule out a rheumatologic cuasee for generalized spine stiffness and Joint pain.

## 2017-10-03 NOTE — Progress Notes (Signed)
Office Visit Note   Patient: Amy Moon           Date of Birth: 1950-11-04           MRN: [id number] Visit Date: 10/03/2017              Requested by: [name], NP [place], [place] [id number] PCP: [name], NP   Assessment & Plan: Visit Diagnoses:  1. Chronic bilateral low back pain, with sciatica presence unspecified   2. Cervicalgia   3. Pain of right hip joint     Plan: Avoid overhead lifting and overhead use of the arms. Do not lift greater than 5 lbs. Adjust head rest in vehicle to prevent hyperextension if rear ended. Take extra precautions to avoid falling, including use of a cane if you feel weak. Avoid bending, stooping and avoid lifting weights greater than 10 lbs. Avoid prolong standing and walking. Avoid frequent bending and stooping  No lifting greater than 10 lbs. May use ice or moist heat for pain. Weight loss is of benefit. Handicap license is approved. Referral to neurology for evaluation.  MRI of cervical and thoracic spine.  Try a course of low dose steroid due to history of ulcer disease. Try a course of low dose neurontin for neurogenic pain.  Obtain lab tests to rule out a rheumatologic cause for generalized spine stiffness and Joint pain.    Follow-Up Instructions: No follow-ups on file.   Orders:  Orders Placed This Encounter  Procedures  . XR Lumbar Spine 2-3 Views  . XR Cervical Spine 2 or 3 views  . XR HIP UNILAT W OR W/O PELVIS 2-3 VIEWS LEFT   No orders of the defined types were placed in this encounter.     Procedures: No procedures performed   Clinical Data: No additional findings.   Subjective: Chief Complaint  Patient presents with  . Neck - Pain  . Lower Back - Pain  . Left Hip - Pain    67 year old female with worsening back and neck pain over the last 1.5 years. She reports pain and stiffness when she first arrises in the morning and goes for a  Hot shower to start her day. She  does yoga and stretching exercises but the pain seems to be worsening. She has numbness and tingling into the arms and electricity in the legs and the feet. Used to being more active. She is more clumbsy and is dropping items. She has pain with neck extension and pain into the shoulders and down the right arm greater than the  Left arm. It goes into the little finger side greater than the thumb. Has had electricity sensation in the arms. Has had surgery between her shoulders in her back and in her l dower back. The surgeries were in [place], in her 30-40s. She thinks it may have seen Dr. Katheran Awe. 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 Pain shoots between shoulder and the back of neck with extension of the neck. The legs do  Feel off balance. Moving the neck she feels a pop and locking sensation with severe pain. Difficulty lying on her back the pain is worsening. She made a promise to her grand daughter to get coaching soccer. 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 Turning her neck side to side with  grating. The lower back pain is with electrical pain into the right leg more than the left. The pain runs along the inner part of the right leg thigh and below the knee to the right medial ankle. She has difficulty walking distances. In the past would walk 2-3 miles every day. Now it is hard to get to the end of the driveway and back about 200 feet. Makes about half before she feels like a pain in the hip left side and then goes down the leg. She has difficulty reaching her shoes and socks. She notices her posture is getting worse where she is tending to stoop more and can not straighten up. Her Vit D is low and bone density reportedly was  Okay. She takes no calcium supplements, Vit D 50,000 units every 7 days. The lower back pain is better with sitting then after  awhile she experiences pain with sitting and has pain with bending and stooping and riding in a car. In the past 6 months to a year she is taking 2-3 showers a day to improve the pain . She is having trouble sleeping due to pain. Her bed is new and medium softens. Bowel function with chronic constipation. Has difficulty holding her urine. Told she has chronic kidney disease. Stairs are difficult and she fell down stairs about  1.5 years ago at her daughters home. She had bruises and also has been told she has low platelet count. 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 Trouble with lifing and falling and she did not feel safe. Has had weight loss, went down to 120 something and started on ensure supplements and now is at 145lbs.Pain over the posterior occiput and posterior to the ears and along the jaw both sides. Daughter passed away age 59 due to [place]'s chorea.     Review of Systems  Constitutional: Positive for unexpected weight change. Negative for activity change, appetite change, chills, diaphoresis, fatigue and fever.  HENT: Negative.  Negative for congestion, dental problem, drooling, ear discharge, ear pain, facial swelling, hearing loss, mouth sores, nosebleeds, postnasal drip, rhinorrhea, sinus pressure, sinus pain, sneezing, sore throat, tinnitus, trouble swallowing and voice change.   Eyes: Positive for visual disturbance. Negative for photophobia, pain, discharge, redness and itching.  Respiratory: Positive for cough, choking and shortness of breath. Negative for apnea, chest tightness, wheezing and stridor.   Cardiovascular: Positive for palpitations and leg swelling. Negative for chest pain.  Gastrointestinal: Positive for constipation. Negative for abdominal distention, abdominal pain, anal bleeding, blood in stool, diarrhea, nausea, rectal pain and vomiting.  Endocrine: Positive for cold intolerance. Negative  for heat intolerance, polydipsia, polyphagia and polyuria.  Genitourinary: Positive for flank pain and urgency. Negative for difficulty urinating, dyspareunia, dysuria, enuresis, frequency, genital sores and hematuria.  Musculoskeletal: Positive for arthralgias, back pain, gait problem, joint swelling, neck pain and neck stiffness.  Skin: Negative.  Negative for color change, pallor, rash and wound.  Allergic/Immunologic: Positive for food allergies. Negative for environmental allergies and immunocompromised state.  Neurological: Positive for weakness, numbness and headaches. Negative for dizziness, tremors, seizures, syncope, facial asymmetry, speech difficulty and light-headedness.  Hematological: Negative for adenopathy. Bruises/bleeds easily.  Psychiatric/Behavioral: Positive for decreased concentration and sleep disturbance. Negative for agitation, behavioral problems, confusion, dysphoric mood, hallucinations, self-injury and suicidal ideas. The patient is hyperactive. The patient is not nervous/anxious.      Objective: Vital Signs: BP (!) 172/79 (BP  Location: Left Arm, Patient Position: Sitting)   Pulse 86   Ht 5\' 1"  (1.549 m)   Wt 146 lb (66.2 kg)   BMI 27.59 kg/m   Physical Exam  Constitutional: She is oriented to person, place, and time. She appears well-developed and well-nourished.  HENT:  Head: Normocephalic and atraumatic.  Eyes: Pupils are equal, round, and reactive to light. EOM are normal.  Neck: Normal range of motion. Neck supple.  Pulmonary/Chest: Effort normal and breath sounds normal.  Abdominal: Soft. Bowel sounds are normal.  Neurological: She is alert and oriented to person, place, and time.  Skin: Skin is warm and dry.  Psychiatric: She has a normal mood and affect. Her behavior is normal. Judgment and thought content normal.    Back Exam   Tenderness  The patient is experiencing tenderness in the lumbar and cervical.  Range of Motion  Extension:  20  abnormal  Flexion: 50  Lateral bend right: 60  Lateral bend left: 60  Rotation right: 60  Rotation left: 60   Muscle Strength  Right Quadriceps:  4/5  Left Quadriceps:  4/5  Right Hamstrings:  5/5  Left Hamstrings:  5/5   Tests  Straight leg raise right: positive at 70 deg Straight leg raise left: positive at 70 deg  Reflexes  Patellar: 2/4 Achilles: 2/4 Babinski's sign: normal   Other  Toe walk: normal Heel walk: normal Sensation: normal Gait: normal  Erythema: no back redness Scars: present  Comments:  Cervical spine is limited by at least 50% with ROM Shulder abduction strength is weak 3/5 bilateral, Biceps strength and triceps is weak bilat 4/5. Finger flexion is decreased right greater than left. Resting tremor right.      Specialty Comments:  No specialty comments available.  Imaging: No results found.   PMFS History: Patient Active Problem List   Diagnosis Date Noted  . Dysphagia 09/03/2017  . Thrombocytopenia ([place]) 09/03/2017  . Healthcare maintenance 07/30/2017  . Chronic pain syndrome 07/30/2017  . Chest pain 07/30/2017  . Parkinson's disease ([place]) 07/30/2017  . Hypertension 07/30/2017  . Severe obesity (BMI >= 40) ([place]) 05/15/2016  . Severe episode of recurrent major depressive disorder, without psychotic features ([place]) 05/15/2016  . Tremor of both hands 05/15/2016   Past Medical History:  Diagnosis Date  . Duodenal ulcer   . Hypertension   . Parkinson disease ([place])   . Parkinson's disease ([place])     Family History  Problem Relation Age of Onset  . Cancer Mother        ovarian  . Hyperlipidemia Mother   . Alzheimer's disease Mother   . Arthritis Mother        rheumatoid  . Cancer Father        lung  . Parkinson's disease Father   . Alzheimer's disease Father   . ALS Sister   . Cancer Maternal Aunt        pancreatic, lung, liver,breast  . Cancer Maternal Uncle        pancreatic, liver, brain  . Heart attack Paternal Aunt   .  Stroke Paternal Aunt   . Heart attack Paternal Uncle   . Stroke Maternal Grandmother   . Stroke Paternal Grandmother   . Heart attack Paternal Grandfather   . Arthritis Sister        rheumatoid  . Huntington's disease Daughter     Past Surgical History:  Procedure Laterality Date  . ABDOMINAL HYSTERECTOMY    . BACK SURGERY    .  GASTROSTOMY     Social History   Occupational History  . Not on file  Tobacco Use  . Smoking status: Former Smoker    Packs/day: 1.00    Years: 22.00    Pack years: 22.00    Types: Cigarettes    Last attempt to quit: 06/26/1986    Years since quitting: 31.2  . Smokeless tobacco: Never Used  Substance and Sexual Activity  . Alcohol use: No  . Drug use: No  . Sexual activity: Not Currently    Birth control/protection: None

## 2017-10-04 ENCOUNTER — Ambulatory Visit (INDEPENDENT_AMBULATORY_CARE_PROVIDER_SITE_OTHER): Payer: Self-pay | Admitting: Specialist

## 2017-10-05 ENCOUNTER — Encounter: Payer: Self-pay | Admitting: Neurology

## 2017-10-05 ENCOUNTER — Telehealth (INDEPENDENT_AMBULATORY_CARE_PROVIDER_SITE_OTHER): Payer: Self-pay | Admitting: Specialist

## 2017-10-05 LAB — RHEUMATOID FACTOR: Rhuematoid fact SerPl-aCnc: <14 IU/mL (ref <14)

## 2017-10-05 LAB — HIGH SENSITIVITY CRP: HS-CRP: 3.3 mg/L — AB

## 2017-10-05 LAB — URIC ACID: Uric Acid, Serum: 3.8 mg/dL (ref 2.5–7.0)

## 2017-10-05 LAB — ANA: Anti Nuclear Antibody(ANA): NEGATIVE

## 2017-10-05 LAB — SEDIMENTATION RATE: SED RATE: 9 mm/h (ref 0–30)

## 2017-10-05 NOTE — Telephone Encounter (Signed)
Patient called to get an appointment with Dr. Louanne Skye in about 3 weeks (10/31/17).  He did not have anything until May 16th @ 3:00.  Please place her on the cancellation list for Dr. Louanne Skye.  Thank you.

## 2017-10-08 ENCOUNTER — Telehealth: Payer: Self-pay | Admitting: Adult Health

## 2017-10-08 ENCOUNTER — Other Ambulatory Visit: Payer: Self-pay | Admitting: Adult Health

## 2017-10-08 MED ORDER — EPINEPHRINE 0.3 MG/0.3ML IJ SOAJ: 0.3000 mg | Freq: Once | INTRAMUSCULAR | 2 refills | Status: AC

## 2017-10-08 NOTE — Telephone Encounter (Signed)
We have not prescribed these medications for the patient previously.  Please review and refill if appropriate.  T. Valjean Ruppel, CMA  

## 2017-10-08 NOTE — Telephone Encounter (Signed)
Pt informed.  T. Roshon Duell, CMA 

## 2017-10-08 NOTE — Telephone Encounter (Signed)
Afternoon Tonya, I sent in Epi Pen order with 2 RFs Thanks! Valetta Fuller

## 2017-10-08 NOTE — Telephone Encounter (Signed)
Patient is requesting an Epi Pen to be sent to [place], she reports being very allergic to bee venom and now that it is warmer weather she wants to have one just in case. Please advise

## 2017-10-09 NOTE — Telephone Encounter (Signed)
I put patient on cancellation list

## 2017-10-11 ENCOUNTER — Ambulatory Visit: Payer: Medicare HMO | Admitting: Cardiology

## 2017-10-11 ENCOUNTER — Encounter: Payer: Self-pay | Admitting: Cardiology

## 2017-10-11 VITALS — BP 140/68 | HR 98 | Ht 60.0 in | Wt 147.8 lb

## 2017-10-11 DIAGNOSIS — R079 Chest pain, unspecified: Secondary | ICD-10-CM

## 2017-10-11 DIAGNOSIS — G20 Parkinson's disease: Secondary | ICD-10-CM | POA: Diagnosis not present

## 2017-10-11 DIAGNOSIS — I10 Essential (primary) hypertension: Secondary | ICD-10-CM | POA: Diagnosis not present

## 2017-10-11 DIAGNOSIS — G894 Chronic pain syndrome: Secondary | ICD-10-CM

## 2017-10-11 MED ORDER — CLOPIDOGREL BISULFATE 75 MG PO TABS: 75.0000 mg | ORAL_TABLET | Freq: Every day | ORAL | 3 refills | Status: DC

## 2017-10-11 MED ORDER — RANOLAZINE ER 500 MG PO TB12: 500.0000 mg | ORAL_TABLET | Freq: Two times a day (BID) | ORAL | 3 refills | Status: DC

## 2017-10-11 MED ORDER — NITROGLYCERIN 0.4 MG SL SUBL: 0.4000 mg | SUBLINGUAL_TABLET | SUBLINGUAL | 11 refills | Status: DC | PRN

## 2017-10-11 NOTE — Progress Notes (Signed)
Cardiology Consultation:    Date:  10/11/2017   ID:  Amy Moon, DOB September 22, 1950, MRN [id number]  PCP:  [name], NP  Cardiologist:  Jenne Campus, MD   Referring MD: [name], NP   Chief Complaint  Patient presents with  . Chest Pain  Chest pain  History of Present Illness:    Amy Moon is a 67 y.o. female who is being seen today for the evaluation of chest pain at the request of Danford, Berna Spare, NP.  She is a very complicated lady apparently many years ago she was diagnosed with Parkinson's disease she was treated with medication however she weaned herself off those medication and she is questioning the diagnosis of Parkinson's.  The reason why she was sent to [place] is the fact that she experiencing chest pain.  She said for about a year when she walks she would get tightness in the chest she would stop tightness goes away she try to exercise on the regular basis she does have a driveway and she walks that drive a few times a day sometimes at the end of her walk she will develop tightness in the chest she was stop and that sensation will go away.  She said a year ago she was able to walk many times now she can do only one lap.  There is no late relieving or aggravating factors of the pain sometimes she reports pain being as severe as 6-7 in scale up to 10.  That pain is also associated with shortness of breath. Her risk factors for coronary artery disease include family history of coronary artery disease but looks like no premature, she does have history of hypertension.  Her cholesterol seems to be quite reasonable with high HDL she quit smoking many years ago. She does have chronic pain syndrome.  She takes steroids right now for multiple joint aches.  There is also some history of depression.  Past Medical History:  Diagnosis Date  . Duodenal ulcer   . Hypertension   . Parkinson disease ([place])   . Parkinson's disease [facility])     Past Surgical History:  Procedure  Laterality Date  . ABDOMINAL HYSTERECTOMY    . BACK SURGERY    . GASTROSTOMY      Current Medications: Current Meds  Medication Sig  . diphenhydrAMINE (BENADRYL) 50 MG tablet Take 50 mg by mouth at bedtime as needed for itching.  . gabapentin (NEURONTIN) 100 MG capsule Take 1 capsule (100 mg total) by mouth at bedtime.  . predniSONE (DELTASONE) 5 MG tablet Take 1 tablet (5 mg total) by mouth daily with breakfast for 14 days, THEN 0.5 tablets (2.5 mg total) daily with breakfast for 14 days.  . Vitamin D, Ergocalciferol, (DRISDOL) 50000 units CAPS capsule Take 1 capsule (50,000 Units total) by mouth every 7 (seven) days.     Allergies:   Nsaids; Inderal [propranolol]; Indomethacin; Penicillin g; Pneumococcal vaccines; Scopolamine; Sinemet [carbidopa w-levodopa]; Tape; and Latex   Social History   Socioeconomic History  . Marital status: Married    Spouse name: Not on file  . Number of children: Not on file  . Years of education: Not on file  . Highest education level: Not on file  Occupational History  . Not on file  Social Needs  . Financial resource strain: Not on file  . Food insecurity:    Worry: Not on file    Inability: Not on file  . Transportation needs:  Medical: Not on file    Non-medical: Not on file  Tobacco Use  . Smoking status: Former Smoker    Packs/day: 1.00    Years: 22.00    Pack years: 22.00    Types: Cigarettes    Last attempt to quit: 06/26/1986    Years since quitting: 31.3  . Smokeless tobacco: Never Used  Substance and Sexual Activity  . Alcohol use: No  . Drug use: No  . Sexual activity: Not Currently    Birth control/protection: None  Lifestyle  . Physical activity:    Days per week: Not on file    Minutes per session: Not on file  . Stress: Not on file  Relationships  . Social connections:    Talks on phone: Not on file    Gets together: Not on file    Attends religious service: Not on file    Active member of club or organization:  Not on file    Attends meetings of clubs or organizations: Not on file    Relationship status: Not on file  Other Topics Concern  . Not on file  Social History Narrative  . Not on file     Family History: The patient's family history includes ALS in her sister; Alzheimer's disease in her father and mother; Arthritis in her mother and sister; Cancer in her father, maternal aunt, maternal uncle, and mother; Heart attack in her paternal aunt, paternal grandfather, and paternal uncle; Huntington's disease in her daughter; Hyperlipidemia in her mother; Parkinson's disease in her father; Stroke in her maternal grandmother, paternal aunt, and paternal grandmother. ROS:   Please see the history of present illness.    All 14 point review of systems negative except as described per history of present illness.  EKGs/Labs/Other Studies Reviewed:    The following studies were reviewed today:   EKG:  EKG is  ordered today.  The ekg ordered today demonstrates normal sinus rhythm normal P interval heart rate was 92 poor R wave progression anterior precordium  Recent Labs: 08/06/2017: ALT 17; BUN 7; Creatinine, Ser 0.60; Hemoglobin 13.2; Platelets 134; Potassium 4.2; Sodium 143; TSH 0.375  Recent Lipid Panel    Component Value Date/Time   CHOL 164 08/06/2017 0907   TRIG 72 08/06/2017 0907   HDL 64 08/06/2017 0907   CHOLHDL 2.6 08/06/2017 0907   LDLCALC 86 08/06/2017 0907    Physical Exam:    VS:  BP 140/68 (BP Location: Right Arm)   Pulse 98   Ht 5' (1.524 m)   Wt 147 lb 12.8 oz (67 kg)   SpO2 97%   BMI 28.87 kg/m     Wt Readings from Last 3 Encounters:  10/11/17 147 lb 12.8 oz (67 kg)  10/03/17 146 lb (66.2 kg)  09/03/17 146 lb 8 oz (66.5 kg)     GEN:  Well nourished, well developed in no acute distress HEENT: Normal NECK: No JVD; No carotid bruits LYMPHATICS: No lymphadenopathy CARDIAC: RRR, no murmurs, no rubs, no gallops RESPIRATORY:  Clear to auscultation without rales,  wheezing or rhonchi  ABDOMEN: Soft, non-tender, non-distended MUSCULOSKELETAL:  No edema; No deformity  SKIN: Warm and dry NEUROLOGIC:  Alert and oriented x 3 PSYCHIATRIC:  Normal affect   ASSESSMENT:    1. Essential hypertension   2. Parkinson's disease ([place])   3. Chronic pain syndrome    PLAN:    In order of problems listed above:  1. Chest pain very suspicious for angina pectoris.  Anytime  she walks she will get tightness in the chest that been going on for about a year however stable pattern.  Only extreme exertion will bring it up.  We talked about options in this situation option being a stress test to try to determine exactly what the problem is versus cardiac catheterization versus medical therapy.  She definitely prefers to try medical therapy first.  I will ask her to start taking Plavix 75 mg daily she tells me that she is allergic to aspirin that she gets some issue with stomach bleeding when she was taking aspirin therefore Plavix will be the appropriate choice.  I will also put her on ranolazine 500 mg twice daily.  Prescription for nitroglycerin with instruction to take it on a as needed basis will be given.  I told her that I would like to see her back in about 3 weeks to see how she does also told her if she gets pain that is not relieved by nitroglycerin she needs to go to the emergency room or if she change her mind and decide to do either stress testing or cardiac catheterization she can let us know any time. 2.  Parkinson's disease: Questionable diagnosis however when I examined her I clearly can see tremor looking very suspicious for Parkinson.  She is scheduled for a neurology visit which is very appropriate 3.  Chronic pain syndrome: Managed by pain specialist.  I see her back in my office in about 3 weeks or sooner if she has a problem   Medication Adjustments/Labs and Tests Ordered: Current medicines are reviewed at length with the patient today.  Concerns regarding  medicines are outlined above.  No orders of the defined types were placed in this encounter.  No orders of the defined types were placed in this encounter.   Signed, Park Liter, MD, [facility]. 10/11/2017 2:46 PM    [place]

## 2017-10-11 NOTE — Patient Instructions (Signed)
Medication Instructions:  Your physician has recommended you make the following change in your medication:  START ranolazine (ranexa) 500 mg twice daily START plavix 75 mg daily START nitroglycerin as needed for chest pain: If you start having chest pain, stop what you are doing and sit down. Take one nitroglycerin, let it dissolve under your tongue, wait for 5 minutes. If you are still having chest pain, you can take another nitroglycerin and wait another 5 minutes. If your chest pain still hasn't resolved, take another nitroglycerin and call 911.    Labwork: None  Testing/Procedures: You had an EKG today.   Your physician has requested that you have an echocardiogram. Echocardiography is a painless test that uses sound waves to create images of your heart. It provides your doctor with information about the size and shape of your heart and how well your heart's chambers and valves are working. This procedure takes approximately one hour. There are no restrictions for this procedure.  Follow-Up: Your physician recommends that you schedule a follow-up appointment in: 3 weeks.   Any Other Special Instructions Will Be Listed Below (If Applicable).     If you need a refill on your cardiac medications before your next appointment, please call your pharmacy.

## 2017-10-16 ENCOUNTER — Ambulatory Visit (INDEPENDENT_AMBULATORY_CARE_PROVIDER_SITE_OTHER): Payer: Medicare HMO | Admitting: Specialist

## 2017-10-16 ENCOUNTER — Encounter (INDEPENDENT_AMBULATORY_CARE_PROVIDER_SITE_OTHER): Payer: Self-pay | Admitting: Specialist

## 2017-10-16 VITALS — BP 177/87 | HR 90 | Ht 60.0 in | Wt 148.0 lb

## 2017-10-16 DIAGNOSIS — M4712 Other spondylosis with myelopathy, cervical region: Secondary | ICD-10-CM | POA: Diagnosis not present

## 2017-10-16 DIAGNOSIS — M5136 Other intervertebral disc degeneration, lumbar region: Secondary | ICD-10-CM

## 2017-10-16 MED ORDER — TRAMADOL HCL 50 MG PO TABS: 50.0000 mg | ORAL_TABLET | Freq: Four times a day (QID) | ORAL | 0 refills | Status: DC | PRN

## 2017-10-16 NOTE — Progress Notes (Signed)
Office Visit Note   Patient: Amy Moon           Date of Birth: 12-01-1950           MRN: [id number] Visit Date: 10/16/2017              Requested by: [name], NP [place], [place] [address] PCP: [name], NP   Assessment & Plan: Visit Diagnoses:  1. Other spondylosis with myelopathy, cervical region   2. Degenerative disc disease, lumbar     Plan: Avoid overhead lifting and overhead use of the arms. Do not lift greater than 5 lbs. Adjust head rest in vehicle to prevent hyperextension if rear ended. Take extra precautions to avoid falling, including use of a cane if you feel weak. Avoid bending, stooping and avoid lifting weights greater than 10 lbs. Avoid prolong standing and walking. Avoid frequent bending and stooping  No lifting greater than 10 lbs. May use ice or moist heat for pain. Weight loss is of benefit. Handicap license is approved. See neurology for evaluation.  MRI of cervical and thoracic spine please keep your appointments to have these done. .  Try a course of low dose steroid due to history of ulcer disease. Try increase dose neurontin for neurogenic pain. Add tramadol for discomfort or pain.   Lab tests rule out a rheumatologic cause for generalized spine stiffness and Joint pain but the elevated CRP suggests some low level infammation.  Start tramadol 50 mg every 6 hours for pain.  Please call to have Mina Marble to have platelet testing performed.   Follow-Up Instructions: Return in about 1 month (around 11/13/2017).   Orders:  No orders of the defined types were placed in this encounter.  No orders of the defined types were placed in this encounter.     Procedures: No procedures performed   Clinical Data: No additional findings.   Subjective: Chief Complaint  Patient presents with  . Neck - Pain  . Lower Back - Pain  . Right Hip - Pain    67 year old female with long history of neck, mid back and  lumbar pain, with history of dysphagia and dysarthria. She has been seen by neurology years ago and Dr. Maia Petties was reportedly more concerned about depression and obesity at the end of the visit. Her pain persist and a MRI of the cervical and thoracic spine has not been done as yet. Her lab  Tests for assessment for an underlying Rheumatologic condition are negative except for elevation of CRP.Marland Kitchen Her cardiologist has started plavix and she is not able to take NSAIDs due to GI difficulties, bleeding ulcers. Her lab in the past has been concerning for thrombocytopenia and her nurse practioner is planning further testing to rule out ITTP. The gabapentin at low Dose is of some benefit. She notes some improvement in the leg discomfort.    Review of Systems  Constitutional: Negative.   HENT: Negative.   Eyes: Negative.   Respiratory: Negative.   Cardiovascular: Negative.   Gastrointestinal: Negative.   Endocrine: Negative.   Genitourinary: Negative.   Musculoskeletal: Negative.   Skin: Negative.   Allergic/Immunologic: Negative.   Neurological: Negative.   Hematological: Negative.   Psychiatric/Behavioral: Negative.      Objective: Vital Signs: BP (!) 177/87 (BP Location: Left Arm, Patient Position: Sitting)   Pulse 90   Ht 5' (1.524 m)   Wt 148 lb (67.1 kg)   BMI 28.90  kg/m   Physical Exam  Constitutional: She is oriented to person, place, and time. She appears well-developed and well-nourished.  HENT:  Head: Normocephalic and atraumatic.  Eyes: Pupils are equal, round, and reactive to light. EOM are normal.  Neck: Normal range of motion. Neck supple.  Pulmonary/Chest: Effort normal and breath sounds normal.  Abdominal: Soft. Bowel sounds are normal.  Neurological: She is alert and oriented to person, place, and time.  Skin: Skin is warm and dry.  Psychiatric: She has a normal mood and affect. Her behavior is normal. Judgment and thought content normal.    Back Exam    Tenderness  The patient is experiencing tenderness in the lumbar.  Range of Motion  Extension: abnormal  Flexion: abnormal  Lateral bend right: abnormal  Lateral bend left: abnormal  Rotation right: abnormal  Rotation left: abnormal   Muscle Strength  Right Quadriceps:  5/5  Left Quadriceps:  5/5  Right Hamstrings:  5/5  Left Hamstrings:  5/5   Tests  Straight leg raise right: negative Straight leg raise left: negative  Reflexes  Patellar:  Hyperreflexic abnormal Achilles: normal Babinski's sign: normal   Other  Toe walk: normal Heel walk: normal Sensation: normal Gait: normal  Erythema: no back redness Scars: absent      Specialty Comments:  No specialty comments available.  Imaging: No results found.   PMFS History: Patient Active Problem List   Diagnosis Date Noted  . Dysphagia 09/03/2017  . Thrombocytopenia ([place]) 09/03/2017  . Healthcare maintenance 07/30/2017  . Chronic pain syndrome 07/30/2017  . Chest pain 07/30/2017  . Parkinson's disease ([place]) 07/30/2017  . Hypertension 07/30/2017  . Severe obesity (BMI >= 40) ([place]) 05/15/2016  . Severe episode of recurrent major depressive disorder, without psychotic features ([place]) 05/15/2016  . Tremor of both hands 05/15/2016   Past Medical History:  Diagnosis Date  . Duodenal ulcer   . Hypertension   . Parkinson disease ([place])   . Parkinson's disease ([place])     Family History  Problem Relation Age of Onset  . Cancer Mother        ovarian  . Hyperlipidemia Mother   . Alzheimer's disease Mother   . Arthritis Mother        rheumatoid  . Cancer Father        lung  . Parkinson's disease Father   . Alzheimer's disease Father   . ALS Sister   . Cancer Maternal Aunt        pancreatic, lung, liver,breast  . Cancer Maternal Uncle        pancreatic, liver, brain  . Heart attack Paternal Aunt   . Stroke Paternal Aunt   . Heart attack Paternal Uncle   . Stroke Maternal Grandmother   . Stroke  Paternal Grandmother   . Heart attack Paternal Grandfather   . Arthritis Sister        rheumatoid  . Huntington's disease Daughter     Past Surgical History:  Procedure Laterality Date  . ABDOMINAL HYSTERECTOMY    . BACK SURGERY    . GASTROSTOMY     Social History   Occupational History  . Not on file  Tobacco Use  . Smoking status: Former Smoker    Packs/day: 1.00    Years: 22.00    Pack years: 22.00    Types: Cigarettes    Last attempt to quit: 06/26/1986    Years since quitting: 31.3  . Smokeless tobacco: Never Used  Substance and Sexual Activity  .  Alcohol use: No  . Drug use: No  . Sexual activity: Not Currently    Birth control/protection: None

## 2017-10-16 NOTE — Patient Instructions (Addendum)
Avoid overhead lifting and overhead use of the arms. Do not lift greater than 5 lbs. Adjust head rest in vehicle to prevent hyperextension if rear ended. Take extra precautions to avoid falling, including use of a cane if you feel weak. Avoid bending, stooping and avoid lifting weights greater than 10 lbs. Avoid prolong standing and walking. Avoid frequent bending and stooping  No lifting greater than 10 lbs. May use ice or moist heat for pain. Weight loss is of benefit. Handicap license is approved. See neurology for evaluation.  MRI of cervical and thoracic spine please keep your appointments to have these done. .  Try a course of low dose steroid due to history of ulcer disease. Try increase dose neurontin for neurogenic pain. Add tramadol for discomfort or pain.   Lab tests rule out a rheumatologic cause for generalized spine stiffness and Joint pain but the elevated CRP suggests some low level infammation.  Start tramadol 50 mg every 6 hours for pain.  Please call to have Mina Marble to have platelet testing performed.

## 2017-10-17 ENCOUNTER — Telehealth: Payer: Self-pay | Admitting: Adult Health

## 2017-10-17 ENCOUNTER — Encounter: Payer: Self-pay | Admitting: Gastroenterology

## 2017-10-17 NOTE — Telephone Encounter (Signed)
Yes, pt does need labs drawn, however not until June, 2019.  Thanks!

## 2017-10-17 NOTE — Telephone Encounter (Signed)
Patient called stating that she was to have her platelets and Vit D rechecked. Please advise front staff if this is the case and so we can sch f/u lab visit

## 2017-10-22 NOTE — Progress Notes (Deleted)
Amy Moon was seen today in the movement disorders clinic for neurologic consultation at the request of Jessy Oto, MD.  The consultation is for the evaluation of possible PD.  Multiple records that were made available to me were reviewed, including referring records, PCP, neurology, ER.  Pt has been seen by Dr. Brett Fairy in 2017.    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  She was seeing neurosurgeon at the time for discectomy and neurosurgery noted tremor and was told she had Parkinson's disease.  She was started on carbidopa/levodopa 50/200, 1 tablet 4 times per day.  She was on this medication for 10 or 12 years.  Patient does not know the name of the neurosurgeon or the place where she saw the person.  Records indicate from other providers that she told them that her Parkinson's disease was advanced and that she was fully wheelchair-bound in 2017 (Dr. Melynda Ripple records) and requiring a PEG tube because of Parkinson's disease. Records just this month from cardiology indicate pt was ambulatory.    She sought care with Dr. Brett Fairy in 2017. She was told she did not have Parkinson's disease.  She was told that she may have had essential tremor.  She was offered a trial of beta-blocker for tremor.  She declined.  She was told on that visit that Dr. Brett Fairy did "not treat fibromyalgia or non-organic disorders."  Dr. Brett Fairy felt that her story was inconsistent.  She reports that the patient was considered for hospice and palliative care, but there was no documentation of such.  She reported "nonsensical information."  Dr. Edwena Felty reports indicate that the patient saw neurology at [facility] and they also did not think she had Parkinson's disease.  I do not see a note from neurology at [facility], with the exception of a referral from Dr. Brett Fairy.  The first symptom(s) the patient noticed was {parkinsons general sx:18033}  and this was {NUMBERS;0-15 BY 1:408015} {days/wks/mos/yrs:310907}.    Specific Symptoms:  Tremor: {yes no:314532} Family hx of similar:  {yes no:314532} Voice: *** Sleep: ***  Vivid Dreams:  {yes no:314532}  Acting out dreams:  {yes no:314532} Wet Pillows: {yes no:314532} Postural symptoms:  {yes no:314532}  Falls?  {yes no:314532} Bradykinesia symptoms: {parkinson brady:18041} Loss of smell:  {yes no:314532} Loss of taste:  {yes no:314532} Urinary Incontinence:  {yes no:314532} Difficulty Swallowing:  {yes no:314532} (reports that she had a PEG tube placed, and records confirm this as she has been into the emergency room and other physicians for issues with the PEG tube.  Reports that she was told she had a PEG tube because of Parkinson's disease) Handwriting, micrographia: {yes no:314532} Trouble with ADL's:  {yes no:314532}  Trouble buttoning clothing: {yes no:314532} Depression:  {yes no:314532} Memory changes:  {yes no:314532} Hallucinations:  {yes no:314532}  visual distortions: {yes no:314532} N/V:  {yes no:314532} Lightheaded:  {yes no:314532}  Syncope: {yes no:314532} Diplopia:  {yes no:314532} Dyskinesia:  {yes no:314532}  Neuroimaging of the brain has *** previously been performed.  It *** available for my review today.  PREVIOUS MEDICATIONS: {Parkinson's RX:18200}  ALLERGIES:   Allergies  Allergen Reactions  . Nsaids Other (See Comments)    History of bleeding ulcers  . Inderal [Propranolol]   . Indomethacin Other (See Comments)    Unknown  . Penicillin G Other (See Comments)    Unknown  . Pneumococcal Vaccines Other (See  Comments)    Caused "pneumonia"  . Scopolamine     The patch, caused her to pass out / change in mental status  . Sinemet [Carbidopa W-Levodopa] Nausea And Vomiting  . Tape   . Latex Hives and Rash    CURRENT MEDICATIONS:  Outpatient Encounter Medications as of 10/25/2017  Medication Sig  . clopidogrel (PLAVIX) 75 MG tablet Take 1  tablet (75 mg total) by mouth daily.  . diphenhydrAMINE (BENADRYL) 50 MG tablet Take 50 mg by mouth at bedtime as needed for itching.  . gabapentin (NEURONTIN) 100 MG capsule Take 1 capsule (100 mg total) by mouth at bedtime.  . nitroGLYCERIN (NITROSTAT) 0.4 MG SL tablet Place 1 tablet (0.4 mg total) under the tongue every 5 (five) minutes as needed for chest pain.  . predniSONE (DELTASONE) 5 MG tablet Take 1 tablet (5 mg total) by mouth daily with breakfast for 14 days, THEN 0.5 tablets (2.5 mg total) daily with breakfast for 14 days.  . ranolazine (RANEXA) 500 MG 12 hr tablet Take 1 tablet (500 mg total) by mouth 2 (two) times daily.  . traMADol (ULTRAM) 50 MG tablet Take 1 tablet (50 mg total) by mouth every 6 (six) hours as needed for moderate pain.  . Vitamin D, Ergocalciferol, (DRISDOL) 50000 units CAPS capsule Take 1 capsule (50,000 Units total) by mouth every 7 (seven) days.   No facility-administered encounter medications on file as of 10/25/2017.     PAST MEDICAL HISTORY:   Past Medical History:  Diagnosis Date  . Duodenal ulcer   . Hypertension   . Parkinson disease ([place])   . Parkinson's disease ([place])     PAST SURGICAL HISTORY:   Past Surgical History:  Procedure Laterality Date  . ABDOMINAL HYSTERECTOMY    . BACK SURGERY    . GASTROSTOMY      SOCIAL HISTORY:   Social History   Socioeconomic History  . Marital status: Married    Spouse name: Not on file  . Number of children: Not on file  . Years of education: Not on file  . Highest education level: Not on file  Occupational History  . Not on file  Social Needs  . Financial resource strain: Not on file  . Food insecurity:    Worry: Not on file    Inability: Not on file  . Transportation needs:    Medical: Not on file    Non-medical: Not on file  Tobacco Use  . Smoking status: Former Smoker    Packs/day: 1.00    Years: 22.00    Pack years: 22.00    Types: Cigarettes    Last attempt to quit: 06/26/1986     Years since quitting: 31.3  . Smokeless tobacco: Never Used  Substance and Sexual Activity  . Alcohol use: No  . Drug use: No  . Sexual activity: Not Currently    Birth control/protection: None  Lifestyle  . Physical activity:    Days per week: Not on file    Minutes per session: Not on file  . Stress: Not on file  Relationships  . Social connections:    Talks on phone: Not on file    Gets together: Not on file    Attends religious service: Not on file    Active member of club or organization: Not on file    Attends meetings of clubs or organizations: Not on file    Relationship status: Not on file  . Intimate partner violence:  Fear of current or ex partner: Not on file    Emotionally abused: Not on file    Physically abused: Not on file    Forced sexual activity: Not on file  Other Topics Concern  . Not on file  Social History Narrative  . Not on file    FAMILY HISTORY:   Family Status  Relation Name Status  . Mother  Deceased  . Father  Deceased  . Sister  Deceased  . Mat Aunt  (Not Specified)  . Mat Uncle  (Not Specified)  . Ethlyn Daniels  (Not Specified)  . Annamarie Major  (Not Specified)  . MGM  (Not Specified)  . PGM  (Not Specified)  . PGF  Deceased  . Sister  Alive  . Daughter  Deceased    ROS:  A complete 10 system review of systems was obtained and was unremarkable apart from what is mentioned above.  PHYSICAL EXAMINATION:    VITALS:  There were no vitals filed for this visit.  GEN:  The patient appears stated age and is in NAD. HEENT:  Normocephalic, atraumatic.  The mucous membranes are moist. The superficial temporal arteries are without ropiness or tenderness. CV:  RRR Lungs:  CTAB Neck/HEME:  There are no carotid bruits bilaterally.  Neurological examination:  Orientation: The patient is alert and oriented x3. Fund of knowledge is appropriate.  Recent and remote memory are intact.  Attention and concentration are normal.    Able to name objects  and repeat phrases. Cranial nerves: There is good facial symmetry. Pupils are equal round and reactive to light bilaterally. Fundoscopic exam reveals clear margins bilaterally. Extraocular muscles are intact. The visual fields are full to confrontational testing. The speech is fluent and clear. Soft palate rises symmetrically and there is no tongue deviation. Hearing is intact to conversational tone. Sensation: Sensation is intact to light and pinprick throughout (facial, trunk, extremities). Vibration is intact at the bilateral big toe. There is no extinction with double simultaneous stimulation. There is no sensory dermatomal level identified. Motor: Strength is 5/5 in the bilateral upper and lower extremities.   Shoulder shrug is equal and symmetric.  There is no pronator drift. Deep tendon reflexes: Deep tendon reflexes are 2/4 at the bilateral biceps, triceps, brachioradialis, patella and achilles. Plantar responses are downgoing bilaterally.  Movement examination: Tone: There is ***tone in the bilateral upper extremities.  The tone in the lower extremities is ***.  Abnormal movements: *** Coordination:  There is *** decremation with RAM's, *** Gait and Station: The patient has *** difficulty arising out of a deep-seated chair without the use of the hands. The patient's stride length is ***.  The patient has a *** pull test.      ASSESSMENT/PLAN:  ***  -I agree with Dr. Brett Fairy that I see no evidence of idiopathic Parkinson's disease.  She reports this diagnosis for 15 years, at which point the diagnosis should be clear.  Records are very inconsistent, with her telling surgery that she was fully wheelchair-bound in 2017, but told cardiology in April, 2019 that she would get chest pain when walking and that a year ago she was able to walk many times and now she can only do one lap.  ***This did not include the 40 min of record review which was detailed above, which was non face to face  time.   Cc:  [name], NP

## 2017-10-23 ENCOUNTER — Ambulatory Visit
Admission: RE | Admit: 2017-10-23 | Discharge: 2017-10-23 | Disposition: A | Discharge status: Home / Self Care | Payer: Medicare HMO | Source: Ambulatory Visit | Attending: Specialist | Admitting: Specialist

## 2017-10-23 DIAGNOSIS — R29898 Other symptoms and signs involving the musculoskeletal system: Secondary | ICD-10-CM

## 2017-10-23 DIAGNOSIS — M4804 Spinal stenosis, thoracic region: Secondary | ICD-10-CM

## 2017-10-23 DIAGNOSIS — M542 Cervicalgia: Secondary | ICD-10-CM

## 2017-10-24 ENCOUNTER — Ambulatory Visit: Payer: Medicare HMO | Admitting: Adult Health

## 2017-10-25 ENCOUNTER — Telehealth (INDEPENDENT_AMBULATORY_CARE_PROVIDER_SITE_OTHER): Payer: Self-pay | Admitting: Specialist

## 2017-10-25 ENCOUNTER — Other Ambulatory Visit (INDEPENDENT_AMBULATORY_CARE_PROVIDER_SITE_OTHER): Payer: Self-pay | Admitting: Specialist

## 2017-10-25 ENCOUNTER — Ambulatory Visit: Payer: Medicare HMO | Admitting: Neurology

## 2017-10-25 MED ORDER — GABAPENTIN 100 MG PO CAPS: ORAL_CAPSULE | ORAL | 3 refills | Status: DC

## 2017-10-25 NOTE — Telephone Encounter (Signed)
Rx sent to her pharmacy. jen 

## 2017-10-25 NOTE — Telephone Encounter (Signed)
This patient is totally out of her Gabapentin 100 mg.  She says she was told to now take 1 in the morning and 2 at bedtime, instead of just 1 at bedtime (as was stated on her last Rx). #1 - Are these the correct directions, and #2 - would you please send in a new Rx to [facility], [phone]?

## 2017-10-25 NOTE — Telephone Encounter (Signed)
Rx refill Gabapentin 100mg  [facility] [phone]

## 2017-10-25 NOTE — Telephone Encounter (Signed)
Patient was instructed to take 1 qam and 2 qhs, so she has run out early.

## 2017-10-31 ENCOUNTER — Ambulatory Visit (HOSPITAL_BASED_OUTPATIENT_CLINIC_OR_DEPARTMENT_OTHER)
Admission: RE | Admit: 2017-10-31 | Discharge: 2017-10-31 | Disposition: A | Discharge status: Home / Self Care | Payer: Medicare HMO | Source: Ambulatory Visit | Attending: Cardiology | Admitting: Cardiology

## 2017-10-31 DIAGNOSIS — I10 Essential (primary) hypertension: Secondary | ICD-10-CM

## 2017-10-31 DIAGNOSIS — R079 Chest pain, unspecified: Secondary | ICD-10-CM | POA: Diagnosis not present

## 2017-10-31 NOTE — Progress Notes (Signed)
  Echocardiogram 2D Echocardiogram has been performed.  Andriy Sherk T Jennifer Payes 10/31/2017, 4:12 PM

## 2017-11-01 ENCOUNTER — Encounter: Payer: Self-pay | Admitting: *Deleted

## 2017-11-01 ENCOUNTER — Ambulatory Visit (INDEPENDENT_AMBULATORY_CARE_PROVIDER_SITE_OTHER): Payer: Medicare HMO | Admitting: Cardiology

## 2017-11-01 ENCOUNTER — Encounter: Payer: Self-pay | Admitting: Cardiology

## 2017-11-01 VITALS — BP 150/82 | HR 82 | Ht 60.0 in | Wt 142.4 lb

## 2017-11-01 DIAGNOSIS — R072 Precordial pain: Secondary | ICD-10-CM | POA: Diagnosis not present

## 2017-11-01 DIAGNOSIS — R079 Chest pain, unspecified: Secondary | ICD-10-CM

## 2017-11-01 DIAGNOSIS — G894 Chronic pain syndrome: Secondary | ICD-10-CM | POA: Diagnosis not present

## 2017-11-01 DIAGNOSIS — I10 Essential (primary) hypertension: Secondary | ICD-10-CM

## 2017-11-01 MED ORDER — ISOSORBIDE MONONITRATE ER 30 MG PO TB24: 30.0000 mg | ORAL_TABLET | Freq: Every day | ORAL | 6 refills | Status: DC

## 2017-11-01 NOTE — Addendum Note (Signed)
Addended by: Austin Miles on: 11/01/2017 02:52 PM   Modules accepted: Orders

## 2017-11-01 NOTE — Patient Instructions (Signed)
Medication Instructions:  Your physician has recommended you make the following change in your medication:  START isosorbide mononitrate (imdur) 30 mg daily  Labwork: None  Testing/Procedures: Your physician has requested that you have a lexiscan myoview. For further information please visit [email or web address]. Please follow instruction sheet, as given.  Follow-Up: Your physician recommends that you schedule a follow-up appointment in: 1 month.  Any Other Special Instructions Will Be Listed Below (If Applicable).     If you need a refill on your cardiac medications before your next appointment, please call your pharmacy.

## 2017-11-01 NOTE — Progress Notes (Signed)
Cardiology Office Note:    Date:  11/01/2017   ID:  Amy Moon, DOB 06/21/1951, MRN [id number]  PCP:  [name], NP  Cardiologist:  Jenne Campus, MD    Referring MD: [name], NP   Chief Complaint  Patient presents with  . 3 week follow up  Doing better  History of Present Illness:    Amy Moon is a 67 y.o. female with chest pain with some worrisome features we had discussion last time talking about options for the situation and she definitely prefer to try medical therapy I put on ranolazine 500 mg and she feels better however she described one worrisome episode of chest pain that she got 1 to 2 weeks ago she actually had to take nitroglycerin and 3 nitroglycerin relieved the pain.  She gets minimal headache after nitroglycerin and again we talked about options I told her cardiac catheterization is 1 of the options stress testing or intensification of the medical therapy she agreed to proceed with stress testing.  I will schedule her to have [place].  In the meantime I will start her on Imdur 30 mill grams daily with instructions to take nitroglycerin on as-needed basis.  Past Medical History:  Diagnosis Date  . Duodenal ulcer   . Hypertension   . Parkinson disease ([place])   . Parkinson's disease [facility])     Past Surgical History:  Procedure Laterality Date  . ABDOMINAL HYSTERECTOMY    . BACK SURGERY    . GASTROSTOMY      Current Medications: Current Meds  Medication Sig  . clopidogrel (PLAVIX) 75 MG tablet Take 1 tablet (75 mg total) by mouth daily.  . diphenhydrAMINE (BENADRYL) 50 MG tablet Take 50 mg by mouth at bedtime as needed for itching.  . gabapentin (NEURONTIN) 100 MG capsule Take one(100 mg) capsule po qAM and Two capsules(total of 200mg ) po qHS  . nitroGLYCERIN (NITROSTAT) 0.4 MG SL tablet Place 1 tablet (0.4 mg total) under the tongue every 5 (five) minutes as needed for chest pain.  . ranolazine (RANEXA) 500 MG 12 hr tablet Take 1 tablet  (500 mg total) by mouth 2 (two) times daily.  . traMADol (ULTRAM) 50 MG tablet Take 1 tablet (50 mg total) by mouth every 6 (six) hours as needed for moderate pain.  . Vitamin D, Ergocalciferol, (DRISDOL) 50000 units CAPS capsule Take 1 capsule (50,000 Units total) by mouth every 7 (seven) days.     Allergies:   Nsaids; Inderal [propranolol]; Indomethacin; Penicillin g; Pneumococcal vaccines; Scopolamine; Sinemet [carbidopa w-levodopa]; Tape; and Latex   Social History   Socioeconomic History  . Marital status: Married    Spouse name: Not on file  . Number of children: Not on file  . Years of education: Not on file  . Highest education level: Not on file  Occupational History  . Not on file  Social Needs  . Financial resource strain: Not on file  . Food insecurity:    Worry: Not on file    Inability: Not on file  . Transportation needs:    Medical: Not on file    Non-medical: Not on file  Tobacco Use  . Smoking status: Former Smoker    Packs/day: 1.00    Years: 22.00    Pack years: 22.00    Types: Cigarettes    Last attempt to quit: 06/26/1986    Years since quitting: 31.3  . Smokeless tobacco: Never Used  Substance and Sexual Activity  .  Alcohol use: No  . Drug use: No  . Sexual activity: Not Currently    Birth control/protection: None  Lifestyle  . Physical activity:    Days per week: Not on file    Minutes per session: Not on file  . Stress: Not on file  Relationships  . Social connections:    Talks on phone: Not on file    Gets together: Not on file    Attends religious service: Not on file    Active member of club or organization: Not on file    Attends meetings of clubs or organizations: Not on file    Relationship status: Not on file  Other Topics Concern  . Not on file  Social History Narrative  . Not on file     Family History: The patient's family history includes ALS in her sister; Alzheimer's disease in her father and mother; Arthritis in her  mother and sister; Cancer in her father, maternal aunt, maternal uncle, and mother; Heart attack in her paternal aunt, paternal grandfather, and paternal uncle; Huntington's disease in her daughter; Hyperlipidemia in her mother; Parkinson's disease in her father; Stroke in her maternal grandmother, paternal aunt, and paternal grandmother. ROS:   Please see the history of present illness.    All 14 point review of systems negative except as described per history of present illness  EKGs/Labs/Other Studies Reviewed:      Recent Labs: 08/06/2017: ALT 17; BUN 7; Creatinine, Ser 0.60; Hemoglobin 13.2; Platelets 134; Potassium 4.2; Sodium 143; TSH 0.375  Recent Lipid Panel    Component Value Date/Time   CHOL 164 08/06/2017 0907   TRIG 72 08/06/2017 0907   HDL 64 08/06/2017 0907   CHOLHDL 2.6 08/06/2017 0907   LDLCALC 86 08/06/2017 0907    Physical Exam:    VS:  BP (!) 150/82   Pulse 82   Ht 5' (1.524 m)   Wt 142 lb 6.4 oz (64.6 kg)   SpO2 98%   BMI 27.81 kg/m     Wt Readings from Last 3 Encounters:  11/01/17 142 lb 6.4 oz (64.6 kg)  10/16/17 148 lb (67.1 kg)  10/11/17 147 lb 12.8 oz (67 kg)     GEN:  Well nourished, well developed in no acute distress HEENT: Normal NECK: No JVD; No carotid bruits LYMPHATICS: No lymphadenopathy CARDIAC: RRR, no murmurs, no rubs, no gallops RESPIRATORY:  Clear to auscultation without rales, wheezing or rhonchi  ABDOMEN: Soft, non-tender, non-distended MUSCULOSKELETAL:  No edema; No deformity  SKIN: Warm and dry LOWER EXTREMITIES: no swelling NEUROLOGIC:  Alert and oriented x 3 PSYCHIATRIC:  Normal affect   ASSESSMENT:    1. Precordial pain   2. Essential hypertension   3. Chronic pain syndrome    PLAN:    In order of problems listed above:  1. Precordial chest pain: Stress test will be done to assess if this is coronary artery disease.  Luckily her cholesterol looks quite good.  I will also initiate Imdur 30 mg daily and continue  with ranolazine and nitroglycerin as needed 2. Essential hypertension blood pressure elevated hopefully with addition of Imdur blood pressure will be better controlled 3. Chronic pain syndrome: Low by internal medicine team.   Medication Adjustments/Labs and Tests Ordered: Current medicines are reviewed at length with the patient today.  Concerns regarding medicines are outlined above.  No orders of the defined types were placed in this encounter.  Medication changes: No orders of the defined types were placed in  this encounter.   Signed, Park Liter, MD, [facility] 11/01/2017 2:38 PM    [facility]

## 2017-11-02 NOTE — Progress Notes (Signed)
Amy Moon was seen today in the movement disorders clinic for neurologic consultation at the request of Danford, Berna Spare, NP.  The consultation is for the evaluation of possible PD.  Multiple records that were made available to me were reviewed, including referring records, PCP, neurology, ER.  Pt has been seen by Dr. Brett Fairy in 2017.    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  She was seeing neurosurgeon at the time for discectomy and neurosurgery noted tremor and was told she had Parkinson's disease.  She was started on carbidopa/levodopa 50/200, 1 tablet 4 times per day.  She was on this medication for 10 or 12 years.  Patient does not know the name of the neurosurgeon or the place where she saw the person.  Pt reports that she cannot take carbidopa/levodopa 25/100 due to nausea.   Records indicate from other providers that she told them that her Parkinson's disease was advanced and that she was fully wheelchair-bound in 2017 (Dr. Melynda Ripple records) and requiring a PEG tube because of Parkinson's disease related dysphagia (she has since had that removed).  Pt reports today that she worked very hard to be able to ambulate again and is no longer WC bound.   She sought care with Dr. Brett Fairy in 2017. She was told she did not have Parkinson's disease.  She was told that she may have had essential tremor.  She was offered a trial of beta-blocker for tremor.  She declined.  She does tell me that she since tried inderal but it dropped her BP and it didn't make her feel good.   She was told on that visit that Dr. Brett Fairy did "not treat fibromyalgia or non-organic disorders."  Dr. Brett Fairy felt that her story was inconsistent.  Dr. Brett Fairy reports that the patient was considered for hospice and palliative care years ago, but there was no documentation of such (she reports this to me today as well).  Dr. Brett Fairy reported "nonsensical  information."  Dr. Edwena Felty reports indicate that the patient saw neurology at [facility] and they also did not think she had Parkinson's disease.  I do not see a note from neurology at [facility], with the exception of a referral from Dr. Brett Fairy.  Pt isn't sure if she saw them or not.   Specific Symptoms:  Tremor: Yes.  , R hand  Family hx of similar:  Yes.  , father had dx of PD (daughter died from HD) Voice: husband reports that the longer she speaks the lower the voice gets Sleep: has trouble with insomnia.    Vivid Dreams:  No.  Acting out dreams:  No. Wet Pillows: No. Postural symptoms:  Yes.    Falls?  Yes.  , last fall was yesterday.  "I fall nearly daily."  Told by Dr. Louanne Skye to use cane Bradykinesia symptoms: slow movements and difficulty getting out of a chair Loss of smell: no "I smell weird stuff" and has smell hypersensitivity Loss of taste:  Yes.   Urinary Incontinence:  No. Difficulty Swallowing:  Yes.   (reports that she had a PEG tube placed, and records confirm this as she has been into the emergency room and other physicians for issues with the PEG tube.  Reports that she was told she had a PEG tube because of Parkinson's disease.  She had this removed several years ago.  She has appt with  GI - Dr. Lyndel Safe - upcoming for this) Handwriting, micrographia: Yes.   Trouble with ADL's:  Yes.   - trouble lacing shoes/trouble with buttons and husband may help her Depression:  Perhaps - "I am short tempered" Memory changes:  Yes.   Hallucinations:  No.  visual distortions: Yes.   N/V:  "comes and goes" Lightheaded:  No.  Syncope: No. Diplopia:  No. Dyskinesia:  No.  Neuroimaging of the brain has not previously been performed.    PREVIOUS MEDICATIONS: Sinemet; inderal  ALLERGIES:   Allergies  Allergen Reactions  . Nsaids Other (See Comments)    History of bleeding ulcers  . Inderal [Propranolol]   . Indomethacin Other (See Comments)    Unknown  . Penicillin G  Other (See Comments)    Unknown  . Pneumococcal Vaccines Other (See Comments)    Caused "pneumonia"  . Scopolamine     The patch, caused her to pass out / change in mental status  . Sinemet [Carbidopa W-Levodopa] Nausea And Vomiting  . Tape   . Latex Hives and Rash    CURRENT MEDICATIONS:  Outpatient Encounter Medications as of 11/12/2017  Medication Sig  . clopidogrel (PLAVIX) 75 MG tablet Take 1 tablet (75 mg total) by mouth daily.  . diphenhydrAMINE (BENADRYL) 50 MG tablet Take 50 mg by mouth at bedtime as needed for itching.  . gabapentin (NEURONTIN) 100 MG capsule Take one(100 mg) capsule po qAM and Two capsules(total of 200mg ) po qHS  . isosorbide mononitrate (IMDUR) 30 MG 24 hr tablet Take 1 tablet (30 mg total) by mouth daily.  . nitroGLYCERIN (NITROSTAT) 0.4 MG SL tablet Place 1 tablet (0.4 mg total) under the tongue every 5 (five) minutes as needed for chest pain.  . ranolazine (RANEXA) 500 MG 12 hr tablet Take 1 tablet (500 mg total) by mouth 2 (two) times daily.  . traMADol (ULTRAM) 50 MG tablet Take 1 tablet (50 mg total) by mouth every 6 (six) hours as needed for moderate pain.  . Vitamin D, Ergocalciferol, (DRISDOL) 50000 units CAPS capsule Take 1 capsule (50,000 Units total) by mouth every 7 (seven) days.  . [DISCONTINUED] Vitamin D, Ergocalciferol, (DRISDOL) 50000 units CAPS capsule Take 1 capsule (50,000 Units total) by mouth every 7 (seven) days.   No facility-administered encounter medications on file as of 11/12/2017.     PAST MEDICAL HISTORY:   Past Medical History:  Diagnosis Date  . Angina pectoris ([place])   . Duodenal ulcer   . Hypertension   . Neuropathy   . Parkinson's disease ([place])     PAST SURGICAL HISTORY:   Past Surgical History:  Procedure Laterality Date  . ABDOMINAL HYSTERECTOMY    . BACK SURGERY    . GASTROSTOMY     peptic ulcer    SOCIAL HISTORY:   Social History   Socioeconomic History  . Marital status: Married    Spouse name: Not  on file  . Number of children: Not on file  . Years of education: Not on file  . Highest education level: Not on file  Occupational History  . Not on file  Social Needs  . Financial resource strain: Not on file  . Food insecurity:    Worry: Not on file    Inability: Not on file  . Transportation needs:    Medical: Not on file    Non-medical: Not on file  Tobacco Use  . Smoking status: Former Smoker    Packs/day: 1.00    Years:  22.00    Pack years: 22.00    Types: Cigarettes    Last attempt to quit: 06/26/1986    Years since quitting: 31.4  . Smokeless tobacco: Never Used  Substance and Sexual Activity  . Alcohol use: No  . Drug use: No  . Sexual activity: Not Currently    Birth control/protection: None  Lifestyle  . Physical activity:    Days per week: Not on file    Minutes per session: Not on file  . Stress: Not on file  Relationships  . Social connections:    Talks on phone: Not on file    Gets together: Not on file    Attends religious service: Not on file    Active member of club or organization: Not on file    Attends meetings of clubs or organizations: Not on file    Relationship status: Not on file  . Intimate partner violence:    Fear of current or ex partner: Not on file    Emotionally abused: Not on file    Physically abused: Not on file    Forced sexual activity: Not on file  Other Topics Concern  . Not on file  Social History Narrative  . Not on file    FAMILY HISTORY:   Family Status  Relation Name Status  . Mother  Deceased  . Father  Deceased  . Sister  Deceased  . Mat Aunt  (Not Specified)  . Mat Uncle  (Not Specified)  . Ethlyn Daniels  (Not Specified)  . Annamarie Major  (Not Specified)  . PGM  (Not Specified)  . PGF  Deceased  . Sister  Alive  . Daughter  Deceased    ROS:  A complete 10 system review of systems was obtained and was unremarkable apart from what is mentioned above.  PHYSICAL EXAMINATION:    VITALS:   Vitals:   11/12/17  1418  BP: (!) 144/80  Pulse: 94  SpO2: 97%  Weight: 146 lb (66.2 kg)  Height: 5' (1.524 m)    GEN:  The patient appears stated age and is in NAD. HEENT:  Normocephalic, atraumatic.  The mucous membranes are moist. The superficial temporal arteries are without ropiness or tenderness. CV:  RRR Lungs:  CTAB Neck/HEME:  There are no carotid bruits bilaterally.  Neurological examination:  Orientation: The patient is alert and oriented x3. Fund of knowledge is appropriate.  Recent and remote memory are intact.  Attention and concentration are normal.    Able to name objects and repeat phrases. Cranial nerves: There is good facial symmetry. Pupils are equal round and reactive to light bilaterally. Fundoscopic exam reveals clear margins bilaterally. Extraocular muscles are intact.  When testing VF, pt reports a R homonymous hemianopsia that she states is due to the fact that she cannot see out of the right eye ever since a cataract surgery that did not go well (nonphysiologic).  The speech is fluent and clear. Soft palate rises symmetrically and there is no tongue deviation. Hearing is intact to conversational tone. Sensation: Sensation is intact to light and pinprick throughout (facial, trunk, extremities). Vibration is intact at the bilateral big toe.  there is vibrational splitting on the chest with the right chest having decreased vibration compared to that of the left.  There is no extinction with double simultaneous stimulation. There is no sensory dermatomal level identified. Motor: There is diffuse giveaway weakness that improves with encouragement.  Strength is at least 4+-5-/5 in the upper  and lower extremities that improves with encouragement.  Grip strength is decreased bilaterally, but is equal bilaterally. Deep tendon reflexes: Deep tendon reflexes are 2-/4 at the bilateral biceps, triceps, brachioradialis, patella and achilles. Plantar responses are downgoing bilaterally.  Movement  examination: Tone: There is normal tone in the bilateral upper extremities.  The tone in the lower extremities is normal.  Abnormal movements: There is RUE that is distractible that is noted.   Tremor changes amplitude and frequency throughout the examination.  When given a beat to tap out with her left hand, she initially loses tremor in the right hand and then the tremor will match the frequency of the beat being tapped out on the opposite hand.  There is both rest, postural and intention tremor. Coordination:  There is no decremation with RAM's, but all rapid alternating movements are slow, with any form of RAMS, including alternating supination and pronation of the forearm, hand opening and closing, finger taps, heel taps and toe taps. Gait and Station: The patient has mild difficulty arising out of a deep-seated chair without the use of the hands but is able to do it on the first attempt although she loses balance when she first gets up.  Her gait has an astasia-abasia quality, is wide-based.  She loses balance but catches herself each time.   ASSESSMENT/PLAN:  1.  Tremor and gait instability  -I agree with Dr. Brett Fairy that I see no evidence of idiopathic Parkinson's disease.  The diagnosis has been reported for many years (63), at which point the diagnosis should be clear.  I explained to the patient that I saw no evidence of Parkinson's disease and she does not meet the [place] brain bank criteria for this disease.  My suspicion is that some of the symptoms are psychogenic in nature, given some of the nonphysiologic findings.  Regardless, I told her it would be very valuable for her to use a walker.  Physical therapy could also be of value.  She is seeing orthopedics in regards to her back and neck pain and will continue to follow-up with Dr. Louanne Skye in that regard.  The patient asked me about doing an MRI of the brain.  While I really have no objection, I also told her that any findings that may be  discovered would be unrelated to the symptoms for which she presented.  We likely will see cerebral small vessel disease as well as some atrophy.  She understands that, but would nonetheless like to proceed with MRI.  We will call her with the results.  She will follow-up with me on an as-needed basis.  Much greater than 50% of this visit was spent in counseling and coordinating care.  Total face to face time:  45 min.  This did not include the 40 min of record review which was detailed above, which was non face to face time.   Cc:  [name], NP

## 2017-11-05 ENCOUNTER — Ambulatory Visit (INDEPENDENT_AMBULATORY_CARE_PROVIDER_SITE_OTHER): Payer: Medicare HMO | Admitting: Adult Health

## 2017-11-05 ENCOUNTER — Encounter: Payer: Self-pay | Admitting: Adult Health

## 2017-11-05 VITALS — BP 115/67 | HR 98 | Ht 60.0 in | Wt 144.3 lb

## 2017-11-05 DIAGNOSIS — E559 Vitamin D deficiency, unspecified: Secondary | ICD-10-CM | POA: Diagnosis not present

## 2017-11-05 DIAGNOSIS — I10 Essential (primary) hypertension: Secondary | ICD-10-CM

## 2017-11-05 DIAGNOSIS — R072 Precordial pain: Secondary | ICD-10-CM

## 2017-11-05 DIAGNOSIS — Z0000 Encounter for general adult medical examination without abnormal findings: Secondary | ICD-10-CM

## 2017-11-05 DIAGNOSIS — D696 Thrombocytopenia, unspecified: Secondary | ICD-10-CM | POA: Diagnosis not present

## 2017-11-05 DIAGNOSIS — Z9189 Other specified personal risk factors, not elsewhere classified: Secondary | ICD-10-CM

## 2017-11-05 DIAGNOSIS — Z1159 Encounter for screening for other viral diseases: Secondary | ICD-10-CM

## 2017-11-05 HISTORY — DX: Encounter for screening for other viral diseases: Z11.59

## 2017-11-05 HISTORY — DX: Other specified personal risk factors, not elsewhere classified: Z91.89

## 2017-11-05 HISTORY — DX: Vitamin D deficiency, unspecified: E55.9

## 2017-11-05 NOTE — Assessment & Plan Note (Signed)
Hep C screening completed today

## 2017-11-05 NOTE — Patient Instructions (Signed)
Mediterranean Diet A Mediterranean diet refers to food and lifestyle choices that are based on the traditions of countries located on the [employer]. This way of eating has been shown to help prevent certain conditions and improve outcomes for people who have chronic diseases, like kidney disease and heart disease. What are tips for following this plan? Lifestyle  Cook and eat meals together with your family, when possible.  Drink enough fluid to keep your urine clear or pale yellow.  Be physically active every day. This includes: ? Aerobic exercise like running or swimming. ? Leisure activities like gardening, walking, or housework.  Get 7-8 hours of sleep each night.  If recommended by your health care provider, drink red wine in moderation. This means 1 glass a day for nonpregnant women and 2 glasses a day for men. A glass of wine equals 5 oz (150 mL). Reading food labels  Check the serving size of packaged foods. For foods such as rice and pasta, the serving size refers to the amount of cooked product, not dry.  Check the total fat in packaged foods. Avoid foods that have saturated fat or trans fats.  Check the ingredients list for added sugars, such as corn syrup. Shopping  At the grocery store, buy most of your food from the areas near the walls of the store. This includes: ? Fresh fruits and vegetables (produce). ? Grains, beans, nuts, and seeds. Some of these may be available in unpackaged forms or large amounts (in bulk). ? Fresh seafood. ? Poultry and eggs. ? Low-fat dairy products.  Buy whole ingredients instead of prepackaged foods.  Buy fresh fruits and vegetables in-season from local farmers markets.  Buy frozen fruits and vegetables in resealable bags.  If you do not have access to quality fresh seafood, buy precooked frozen shrimp or canned fish, such as tuna, salmon, or sardines.  Buy small amounts of raw or cooked vegetables, salads, or olives from  the deli or salad bar at your store.  Stock your pantry so you always have certain foods on hand, such as olive oil, canned tuna, canned tomatoes, rice, pasta, and beans. Cooking  Cook foods with extra-virgin olive oil instead of using butter or other vegetable oils.  Have meat as a side dish, and have vegetables or grains as your main dish. This means having meat in small portions or adding small amounts of meat to foods like pasta or stew.  Use beans or vegetables instead of meat in common dishes like chili or lasagna.  Experiment with different cooking methods. Try roasting or broiling vegetables instead of steaming or sauteing them.  Add frozen vegetables to soups, stews, pasta, or rice.  Add nuts or seeds for added healthy fat at each meal. You can add these to yogurt, salads, or vegetable dishes.  Marinate fish or vegetables using olive oil, lemon juice, garlic, and fresh herbs. Meal planning  Plan to eat 1 vegetarian meal one day each week. Try to work up to 2 vegetarian meals, if possible.  Eat seafood 2 or more times a week.  Have healthy snacks readily available, such as: ? Vegetable sticks with hummus. ? [place] yogurt. ? Fruit and nut trail mix.  Eat balanced meals throughout the week. This includes: ? Fruit: 2-3 servings a day ? Vegetables: 4-5 servings a day ? Low-fat dairy: 2 servings a day ? Fish, poultry, or lean meat: 1 serving a day ? Beans and legumes: 2 or more servings a week ? Nuts  and seeds: 1-2 servings a day ? Whole grains: 6-8 servings a day ? Extra-virgin olive oil: 3-4 servings a day  Limit red meat and sweets to only a few servings a month What are my food choices?  Mediterranean diet ? Recommended ? Grains: Whole-grain pasta. Brown rice. Bulgar wheat. Polenta. Couscous. Whole-wheat bread. [place]. ? Vegetables: Artichokes. Beets. Broccoli. Cabbage. Carrots. Eggplant. Green beans. Chard. Kale. Spinach. Onions. Leeks. Peas. Squash.  Tomatoes. Peppers. Radishes. ? Fruits: Apples. Apricots. Avocado. Berries. Bananas. Cherries. Dates. Figs. Grapes. Lemons. Melon. Oranges. Peaches. Plums. Pomegranate. ? Meats and other protein foods: Beans. Almonds. Sunflower seeds. Pine nuts. Peanuts. [place]. Salmon. Scallops. Shrimp. [place]. Tilapia. Clams. Oysters. Eggs. ? Dairy: Low-fat milk. Cheese. Greek yogurt. ? Beverages: Water. Red wine. Herbal tea. ? Fats and oils: Extra virgin olive oil. Avocado oil. Grape seed oil. ? Sweets and desserts: [place] yogurt with honey. Baked apples. Poached pears. Trail mix. ? Seasoning and other foods: Basil. Cilantro. Coriander. Cumin. Mint. Parsley. Sage. Rosemary. Tarragon. Garlic. Oregano. Thyme. Pepper. Balsalmic vinegar. Tahini. Hummus. Tomato sauce. Olives. Mushrooms. ? Limit these ? Grains: Prepackaged pasta or rice dishes. Prepackaged cereal with added sugar. ? Vegetables: Deep fried potatoes (french fries). ? Fruits: Fruit canned in syrup. ? Meats and other protein foods: Beef. Pork. Lamb. Poultry with skin. Hot dogs. [place] Salines. ? Dairy: Ice cream. Sour cream. Whole milk. ? Beverages: Juice. Sugar-sweetened soft drinks. Beer. Liquor and spirits. ? Fats and oils: Butter. Canola oil. Vegetable oil. Beef fat (tallow). Lard. ? Sweets and desserts: Cookies. Cakes. Pies. Candy. ? Seasoning and other foods: Mayonnaise. Premade sauces and marinades. ? The items listed may not be a complete list. Talk with your dietitian about what dietary choices are right for you. Summary  The Mediterranean diet includes both food and lifestyle choices.  Eat a variety of fresh fruits and vegetables, beans, nuts, seeds, and whole grains.  Limit the amount of red meat and sweets that you eat.  Talk with your health care provider about whether it is safe for you to drink red wine in moderation. This means 1 glass a day for nonpregnant women and 2 glasses a day for men. A glass of wine equals 5 oz (150 mL). This information  is not intended to replace advice given to you by your health care provider. Make sure you discuss any questions you have with your health care provider. Document Released: 02/03/2016 Document Revised: 03/07/2016 Document Reviewed: 02/03/2016 Elsevier Interactive Patient Education  2018 [employer].   Fatigue Fatigue is feeling tired all of the time, a lack of energy, or a lack of motivation. Occasional or mild fatigue is often a normal response to activity or life in general. However, long-lasting (chronic) or extreme fatigue may indicate an underlying medical condition. Follow these instructions at home: Watch your fatigue for any changes. The following actions may help to lessen any discomfort you are feeling:  Talk to your health care provider about how much sleep you need each night. Try to get the required amount every night.  Take medicines only as directed by your health care provider.  Eat a healthy and nutritious diet. Ask your health care provider if you need help changing your diet.  Drink enough fluid to keep your urine clear or pale yellow.  Practice ways of relaxing, such as yoga, meditation, massage therapy, or acupuncture.  Exercise regularly.  Change situations that cause you stress. Try to keep your work and personal routine reasonable.  Do not abuse  illegal drugs.  Limit alcohol intake to no more than 1 drink per day for nonpregnant women and 2 drinks per day for men. One drink equals 12 ounces of beer, 5 ounces of wine, or 1 ounces of hard liquor.  Take a multivitamin, if directed by your health care provider.  Contact a health care provider if:  Your fatigue does not get better.  You have a fever.  You have unintentional weight loss or gain.  You have headaches.  You have difficulty: ? Falling asleep. ? Sleeping throughout the night.  You feel angry, guilty, anxious, or sad.  You are unable to have a bowel movement (constipation).  You skin is  dry.  Your legs or another part of your body is swollen. Get help right away if:  You feel confused.  Your vision is blurry.  You feel faint or pass out.  You have a severe headache.  You have severe abdominal, pelvic, or back pain.  You have chest pain, shortness of breath, or an irregular or fast heartbeat.  You are unable to urinate or you urinate less than normal.  You develop abnormal bleeding, such as bleeding from the rectum, vagina, nose, lungs, or nipples.  You vomit blood.  You have thoughts about harming yourself or committing suicide.  You are worried that you might harm someone else. This information is not intended to replace advice given to you by your health care provider. Make sure you discuss any questions you have with your health care provider. Document Released: 04/09/2007 Document Revised: 11/18/2015 Document Reviewed: 10/14/2013 Elsevier Interactive Patient Education  2018 [place] all medications as directed. Continue with [place], as tolerated. Continue excellent water intake and healthy eating. Please keep all upcoming specialist appt's. We will call you when lab results are available. Follow-up in 6 months for Medicare Wellness visit with fasting labs. NICE TO SEE YOU!

## 2017-11-05 NOTE — Assessment & Plan Note (Signed)
Per pt she has had low plt count >3 years Last plt level 134 on 08/06/17 CBC repeated today

## 2017-11-05 NOTE — Progress Notes (Signed)
Subjective:    Patient ID: Amy Moon, female    DOB: [birth year], 67 y.o.   MRN: [id number]  Hypertension  Associated symptoms include chest pain and neck pain. Pertinent negatives include no palpitations or shortness of breath.  :  07/30/17 OV: [name] is here to establish as a new pt.  She is a pleasant 67 year old female.  PMH:  1995 Herniated Disk Repair She developed severe dysphagia and G tube was placed 2012. She has tube removed 2017 Elevated BP, diffuse Arthritis, Bil upper extremity tremor, depression, chronic pain syndrome, and was previously wheelchair bound until about 10 months ago. She was seen by Neurologist 11/17 for evaluation of Parkinson's Disease, she was previously on Sinemet 50/200 Q6H for 10-12 years.  She weaned herself off Sinemet around 02/2016.   Neurology believed her tremor not to be r/t primary PD movement disorder and was trailed on Inderal- 60mg  daily.   She reports only taking the medication for one week b/c "it made me feel horrible". Neurology recommended pain management referral- [place] Controlled Substance Database reflects rx for Hydrocodone 10mg  in 4/09 and 1/18. Also reflects Methadone use throughout 2017 She has not had regular primary care in "many years" She denies tobacco/ETOH use She denies current depression or thoughts of harming herself/others. She does not drive and reports multiple falls/month, last one yesterday.  She ambulates at home with "leaning on furniture" and a cane. She reports chest pain described as "an elephant sitting on my chest" that occurs 1-2 times/week and last 4-60mins and will resolve "if I sit down and relax".  She denies personal or family hx of CAD or MI   08/30/17 OV: [name] presents for f/u for several issues: 1) Increased CP- now occurs daily and worsens with exertion (ie. Walking).  She initially denied family hx of MI/CAD, however today reports that her father had MI in mid 71s. EKG 07/30/17- NSR EKG today- NSR,  no acute changes in tracing from previous imaging  2)She also reports dysphagia worsening the last 6 months,  will get "mildly choked up" a few times a week.   3) She continues to experience chronic neck/bil shoulder pain.  She has been using heating pad to treat sx's.  [facility] Clinic called her to schedule initial appt- advised to at least be seen/evaluated. We reviewed recent labs at length.  Her plt's have ben running low since May 2017, will repreat CBC at next OV  11/05/17 OV: [name] is here for f/u: thrombocytopenia, CP, chronic pain. Ortho provided her Tramadol 50mg  PRN QHS, she reports only using "a few tablets". MRI completed 10/23/17- she has f/u Ortho/Dr. Louanne Skye on Nov 22, 2017 She was seen by Cards 10/11/17- she was started on Ranolazine (Ranexa) 500mg  BID, started on Plavix 75mg  QD, and PRN Nitro Echocardiogram completed 10/31/17 Started on Imdur 30mg  QD on 11/01/17 She has cards f/u by end of May 2019 She reports only needing one dose of Nitro 0.4mg  SL daily, reduced from 3-4 doses/day. She initial OV a Neurology on Nov 22, 2017, re: parkinson's, essential tremor She has OV with GI Nov 23, 2017, re: dysphagia, to discuss dysphagia , colonoscopy She reports overall pain has reduced with PRN Tramadol and scheduled Gabapentin- 1 tab am and 2 tabs PM She continues to practice Ta Chi 3-4 times/week and performs daily house/yard work She denies thoughts of harming herself/others Of Note- she continue to appear quite fatigued, this has been her baseline since starting with  practice Feb 2019  Patient Care Team    Relationship Specialty Notifications Start End  [name], NP PCP - General Family Medicine  07/05/17     Patient Active Problem List   Diagnosis Date Noted  . Dysphagia 09/03/2017  . Thrombocytopenia ([place]) 09/03/2017  . Healthcare maintenance 07/30/2017  . Chronic pain syndrome 07/30/2017  . Chest pain 07/30/2017  . Parkinson's disease ([place]) 07/30/2017  . Hypertension  07/30/2017  . Severe obesity (BMI >= 40) ([place]) 05/15/2016  . Severe episode of recurrent major depressive disorder, without psychotic features ([place]) 05/15/2016  . Tremor of both hands 05/15/2016     Past Medical History:  Diagnosis Date  . Duodenal ulcer   . Hypertension   . Parkinson disease ([place])   . Parkinson's disease [facility])      Past Surgical History:  Procedure Laterality Date  . ABDOMINAL HYSTERECTOMY    . BACK SURGERY    . GASTROSTOMY       Family History  Problem Relation Age of Onset  . Cancer Mother        ovarian  . Hyperlipidemia Mother   . Alzheimer's disease Mother   . Arthritis Mother        rheumatoid  . Cancer Father        lung  . Parkinson's disease Father   . Alzheimer's disease Father   . ALS Sister   . Cancer Maternal Aunt        pancreatic, lung, liver,breast  . Cancer Maternal Uncle        pancreatic, liver, brain  . Heart attack Paternal Aunt   . Stroke Paternal Aunt   . Heart attack Paternal Uncle   . Stroke Maternal Grandmother   . Stroke Paternal Grandmother   . Heart attack Paternal Grandfather   . Arthritis Sister        rheumatoid  . Huntington's disease Daughter      Social History   Substance and Sexual Activity  Drug Use No     Social History   Substance and Sexual Activity  Alcohol Use No     Social History   Tobacco Use  Smoking Status Former Smoker  . Packs/day: 1.00  . Years: 22.00  . Pack years: 22.00  . Types: Cigarettes  . Last attempt to quit: 06/26/1986  . Years since quitting: 31.3  Smokeless Tobacco Never Used     Outpatient Encounter Medications as of 11/05/2017  Medication Sig  . clopidogrel (PLAVIX) 75 MG tablet Take 1 tablet (75 mg total) by mouth daily.  . diphenhydrAMINE (BENADRYL) 50 MG tablet Take 50 mg by mouth at bedtime as needed for itching.  . gabapentin (NEURONTIN) 100 MG capsule Take one(100 mg) capsule po qAM and Two capsules(total of 200mg ) po qHS  . isosorbide mononitrate  (IMDUR) 30 MG 24 hr tablet Take 1 tablet (30 mg total) by mouth daily.  . nitroGLYCERIN (NITROSTAT) 0.4 MG SL tablet Place 1 tablet (0.4 mg total) under the tongue every 5 (five) minutes as needed for chest pain.  . ranolazine (RANEXA) 500 MG 12 hr tablet Take 1 tablet (500 mg total) by mouth 2 (two) times daily.  . traMADol (ULTRAM) 50 MG tablet Take 1 tablet (50 mg total) by mouth every 6 (six) hours as needed for moderate pain.  . Vitamin D, Ergocalciferol, (DRISDOL) 50000 units CAPS capsule Take 1 capsule (50,000 Units total) by mouth every 7 (seven) days.   No facility-administered encounter medications on file as of  11/05/2017.     Allergies: Nsaids; Inderal [propranolol]; Indomethacin; Penicillin g; Pneumococcal vaccines; Scopolamine; Sinemet [carbidopa w-levodopa]; Tape; and Latex  Body mass index is 28.18 kg/m.  Blood pressure 115/67, pulse 98, height 5' (1.524 m), weight 144 lb 4.8 oz (65.5 kg), SpO2 96 %.   Review of Systems  Constitutional: Positive for activity change, fatigue and fever. Negative for appetite change, chills, diaphoresis and unexpected weight change.  Respiratory: Positive for chest tightness. Negative for cough, shortness of breath, wheezing and stridor.   Cardiovascular: Positive for chest pain. Negative for palpitations and leg swelling.  Gastrointestinal: Negative for abdominal distention, abdominal pain, blood in stool, constipation, diarrhea, nausea and vomiting.  Endocrine: Negative for cold intolerance, heat intolerance, polydipsia, polyphagia and polyuria.  Genitourinary: Negative for difficulty urinating and flank pain.  Musculoskeletal: Positive for arthralgias, back pain, gait problem, joint swelling, myalgias, neck pain and neck stiffness.  Skin: Negative for color change, pallor, rash and wound.  Neurological: Positive for dizziness, tremors and weakness. Negative for speech difficulty.  Hematological: Does not bruise/bleed easily.   Psychiatric/Behavioral: Positive for sleep disturbance. Negative for decreased concentration, dysphoric mood, hallucinations, self-injury and suicidal ideas. The patient is not nervous/anxious and is not hyperactive.        Objective:   Physical Exam  Constitutional: She is oriented to person, place, and time. She appears well-developed and well-nourished. No distress.  HENT:  Head: Normocephalic and atraumatic.  Right Ear: External ear normal.  Left Ear: External ear normal.  Eyes: Pupils are equal, round, and reactive to light. Conjunctivae are normal.  Cardiovascular: Regular rhythm, normal heart sounds and intact distal pulses. Tachycardia present.  No murmur heard. Pulmonary/Chest: Effort normal and breath sounds normal. No respiratory distress. She has no wheezes. She has no rales. She exhibits no tenderness.  Neurological: She is alert and oriented to person, place, and time. She displays tremor.  Slow, unsteady gait- uses walls/furniture to steady herself  Skin: Skin is warm and dry. No rash noted. She is not diaphoretic. No erythema. No pallor.  Psychiatric: She has a normal mood and affect. Her behavior is normal. Judgment and thought content normal.      Assessment & Plan:   1. Thrombocytopenia ([place])   2. Vitamin D deficiency   3. Encounter for hepatitis C virus screening test for high risk patient   4. Healthcare maintenance   5. Essential hypertension   6. Precordial pain     Thrombocytopenia ([facility]) Per pt she has had low plt count >3 years Last plt level 134 on 08/06/17 CBC repeated today  Vitamin D deficiency Vit d rechecked today  Healthcare maintenance Continue all medications as directed. Continue with Ta Chi, as tolerated. Continue excellent water intake and healthy eating. Please keep all upcoming specialist appt's. We will call you when lab results are available. Follow-up in 6 months for Medicare Wellness visit with fasting labs.  Encounter for  hepatitis C virus screening test for high risk patient Hep C screening completed today  Hypertension BP at goal 115/67 HR 98   Chest pain Reduction in CP and PRN Nitro use Currently on Ranolazine (Ranexa) 500mg  BOD, Imdur 30mg  QD, PRN Nitro 0.4mg  SL    FOLLOW-UP:  Return in about 6 months (around 05/08/2018) for Medicare Wellness.

## 2017-11-05 NOTE — Assessment & Plan Note (Signed)
BP at goal 115/67 HR 98

## 2017-11-05 NOTE — Assessment & Plan Note (Signed)
Reduction in CP and PRN Nitro use Currently on Ranolazine (Ranexa) 500mg  BOD, Imdur 30mg  QD, PRN Nitro 0.4mg  SL

## 2017-11-05 NOTE — Assessment & Plan Note (Signed)
Vit d rechecked today

## 2017-11-05 NOTE — Assessment & Plan Note (Signed)
Continue all medications as directed. Continue with Ta Chi, as tolerated. Continue excellent water intake and healthy eating. Please keep all upcoming specialist appt's. We will call you when lab results are available. Follow-up in 6 months for Medicare Wellness visit with fasting labs.

## 2017-11-06 LAB — CBC WITH DIFFERENTIAL/PLATELET
BASOS ABS: 0 10*3/uL (ref 0.0–0.2)
BASOS: 1 %
EOS (ABSOLUTE): 0.1 10*3/uL (ref 0.0–0.4)
EOS: 2 %
HEMATOCRIT: 35.2 % (ref 34.0–46.6)
Hemoglobin: 12.2 g/dL (ref 11.1–15.9)
IMMATURE GRANS (ABS): 0 10*3/uL (ref 0.0–0.1)
Immature Granulocytes: 0 %
LYMPHS ABS: 1.4 10*3/uL (ref 0.7–3.1)
LYMPHS: 29 %
MCH: 32.6 pg (ref 26.6–33.0)
MCHC: 34.7 g/dL (ref 31.5–35.7)
MCV: 94 fL (ref 79–97)
Monocytes Absolute: 0.3 10*3/uL (ref 0.1–0.9)
Monocytes: 7 %
Neutrophils Absolute: 3 10*3/uL (ref 1.4–7.0)
Neutrophils: 61 %
Platelets: 134 10*3/uL — ABNORMAL LOW (ref 150–379)
RBC: 3.74 x10E6/uL — ABNORMAL LOW (ref 3.77–5.28)
RDW: 13.2 % (ref 12.3–15.4)
WBC: 4.8 10*3/uL (ref 3.4–10.8)

## 2017-11-06 LAB — VITAMIN D 25 HYDROXY (VIT D DEFICIENCY, FRACTURES): VIT D 25 HYDROXY: 18.6 ng/mL — AB (ref 30.0–100.0)

## 2017-11-06 LAB — HEPATITIS C ANTIBODY: Hep C Virus Ab: <0.1 s/co ratio (ref 0.0–0.9)

## 2017-11-07 ENCOUNTER — Other Ambulatory Visit: Payer: Self-pay | Admitting: Adult Health

## 2017-11-07 MED ORDER — VITAMIN D (ERGOCALCIFEROL) 1.25 MG (50000 UNIT) PO CAPS: 50000.0000 [IU] | ORAL_CAPSULE | ORAL | 0 refills | Status: DC

## 2017-11-08 ENCOUNTER — Ambulatory Visit (INDEPENDENT_AMBULATORY_CARE_PROVIDER_SITE_OTHER): Payer: Medicare HMO | Admitting: Specialist

## 2017-11-12 ENCOUNTER — Ambulatory Visit: Payer: Medicare HMO | Admitting: Neurology

## 2017-11-12 ENCOUNTER — Encounter: Payer: Self-pay | Admitting: Neurology

## 2017-11-12 VITALS — BP 144/80 | HR 94 | Ht 60.0 in | Wt 146.0 lb

## 2017-11-12 DIAGNOSIS — R270 Ataxia, unspecified: Secondary | ICD-10-CM | POA: Diagnosis not present

## 2017-11-12 DIAGNOSIS — R251 Tremor, unspecified: Secondary | ICD-10-CM

## 2017-11-12 NOTE — Patient Instructions (Signed)
1. We have sent a referral to [facility] for your MRI and they will call you directly to schedule your appt. They are located at [address] If you need to contact them directly please call [phone].   

## 2017-11-16 ENCOUNTER — Ambulatory Visit (INDEPENDENT_AMBULATORY_CARE_PROVIDER_SITE_OTHER): Payer: Medicare HMO | Admitting: Specialist

## 2017-11-21 ENCOUNTER — Telehealth (HOSPITAL_COMMUNITY): Payer: Self-pay | Admitting: *Deleted

## 2017-11-21 NOTE — Telephone Encounter (Signed)
Patient given detailed instructions per Myocardial Perfusion Study Information Sheet for the test on 11/26/17. Patient notified to arrive 15 minutes early and that it is imperative to arrive on time for appointment to keep from having the test rescheduled.  If you need to cancel or reschedule your appointment, please call the office within 24 hours of your appointment. . Patient verbalized understanding. Darwin Rothlisberger Jacqueline    

## 2017-11-22 ENCOUNTER — Encounter (INDEPENDENT_AMBULATORY_CARE_PROVIDER_SITE_OTHER): Payer: Self-pay | Admitting: Specialist

## 2017-11-22 ENCOUNTER — Ambulatory Visit (INDEPENDENT_AMBULATORY_CARE_PROVIDER_SITE_OTHER): Payer: Medicare HMO | Admitting: Specialist

## 2017-11-22 ENCOUNTER — Telehealth (INDEPENDENT_AMBULATORY_CARE_PROVIDER_SITE_OTHER): Payer: Self-pay | Admitting: Specialist

## 2017-11-22 VITALS — BP 138/78 | HR 95 | Ht 60.0 in | Wt 146.0 lb

## 2017-11-22 DIAGNOSIS — R270 Ataxia, unspecified: Secondary | ICD-10-CM

## 2017-11-22 DIAGNOSIS — M4802 Spinal stenosis, cervical region: Secondary | ICD-10-CM | POA: Diagnosis not present

## 2017-11-22 DIAGNOSIS — M4722 Other spondylosis with radiculopathy, cervical region: Secondary | ICD-10-CM

## 2017-11-22 MED ORDER — TRAMADOL HCL 50 MG PO TABS: 50.0000 mg | ORAL_TABLET | Freq: Four times a day (QID) | ORAL | 0 refills | Status: DC | PRN

## 2017-11-22 MED ORDER — GABAPENTIN 300 MG PO CAPS: ORAL_CAPSULE | ORAL | 2 refills | Status: DC

## 2017-11-22 NOTE — Patient Instructions (Signed)
Plan: Avoid overhead lifting and overhead use of the arms. Do not lift greater than 5 lbs. Adjust head rest in vehicle to prevent hyperextension if rear ended. Take extra precautions to avoid falling.  Fall Prevention and Home Safety Falls cause injuries and can affect all age groups. It is possible to use preventive measures to significantly decrease the likelihood of falls. There are many simple measures which can make your home safer and prevent falls. OUTDOORS  Repair cracks and edges of walkways and driveways.  Remove high doorway thresholds.  Trim shrubbery on the main path into your home.  Have good outside lighting.  Clear walkways of tools, rocks, debris, and clutter.  Check that handrails are not broken and are securely fastened. Both sides of steps should have handrails.  Have leaves, snow, and ice cleared regularly.  Use sand or salt on walkways during winter months.  In the garage, clean up grease or oil spills. BATHROOM  Install night lights.  Install grab bars by the toilet and in the tub and shower.  Use non-skid mats or decals in the tub or shower.  Place a plastic non-slip stool in the shower to sit on, if needed.  Keep floors dry and clean up all water on the floor immediately.  Remove soap buildup in the tub or shower on a regular basis.  Secure bath mats with non-slip, double-sided rug tape.  Remove throw rugs and tripping hazards from the floors. BEDROOMS  Install night lights.  Make sure a bedside light is easy to reach.  Do not use oversized bedding.  Keep a telephone by your bedside.  Have a firm chair with side arms to use for getting dressed.  Remove throw rugs and tripping hazards from the floor. KITCHEN  Keep handles on pots and pans turned toward the center of the stove. Use back burners when possible.  Clean up spills quickly and allow time for drying.  Avoid walking on wet floors.  Avoid hot utensils and  knives.  Position shelves so they are not too high or low.  Place commonly used objects within easy reach.  If necessary, use a sturdy step stool with a grab bar when reaching.  Keep electrical cables out of the way.  Do not use floor polish or wax that makes floors slippery. If you must use wax, use non-skid floor wax.  Remove throw rugs and tripping hazards from the floor. STAIRWAYS  Never leave objects on stairs.  Place handrails on both sides of stairways and use them. Fix any loose handrails. Make sure handrails on both sides of the stairways are as long as the stairs.  Check carpeting to make sure it is firmly attached along stairs. Make repairs to worn or loose carpet promptly.  Avoid placing throw rugs at the top or bottom of stairways, or properly secure the rug with carpet tape to prevent slippage. Get rid of throw rugs, if possible.  Have an electrician put in a light switch at the top and bottom of the stairs. OTHER FALL PREVENTION TIPS  Wear low-heel or rubber-soled shoes that are supportive and fit well. Wear closed toe shoes.  When using a stepladder, make sure it is fully opened and both spreaders are firmly locked. Do not climb a closed stepladder.  Add color or contrast paint or tape to grab bars and handrails in your home. Place contrasting color strips on first and last steps.  Learn and use mobility aids as needed. Install an electrical emergency   response system.  Turn on lights to avoid dark areas. Replace light bulbs that burn out immediately. Get light switches that glow .  Arrange furniture to create clear pathways. Keep furniture in the same place.  Firmly attach carpet with non-skid or double-sided tape.  Eliminate uneven floor surfaces.  Select a carpet pattern that does not visually hide the edge of steps.  Be aware of all pets. OTHER HOME SAFETY TIPS  Set the water temperature for 120 F (48.8 C).  Keep emergency numbers on or near the  telephone.  Keep smoke detectors on every level of the home and near sleeping areas. Document Released: 06/02/2002 Document Revised: 12/12/2011 Document Reviewed: 09/01/2011 Exit[employer] Patient Information 2014 [employer]. 

## 2017-11-22 NOTE — Telephone Encounter (Signed)
01/10/18 Thursday 2:30pm  sched pt appt 3 week f/u

## 2017-11-22 NOTE — Progress Notes (Signed)
Office Visit Note   Patient: Amy Moon           Date of Birth: Jun 22, 1951           MRN: [id number] Visit Date: 11/22/2017              Requested by: [name], NP [place], [place] [address] PCP: [name], NP   Assessment & Plan: Visit Diagnoses:  1. Other spondylosis with radiculopathy, cervical region   2. Spinal stenosis of cervical region   3. Ataxia     Plan:  Avoid overhead lifting and overhead use of the arms. Do not lift greater than 5 lbs. Adjust head rest in vehicle to prevent hyperextension if rear ended. Take extra precautions to avoid falling. Fall Prevention and Home Safety Falls cause injuries and can affect all age groups. It is possible to use preventive measures to significantly decrease the likelihood of falls. There are many simple measures which can make your home safer and prevent falls. OUTDOORS  Repair cracks and edges of walkways and driveways.  Remove high doorway thresholds.  Trim shrubbery on the main path into your home.  Have good outside lighting.  Clear walkways of tools, rocks, debris, and clutter.  Check that handrails are not broken and are securely fastened. Both sides of steps should have handrails.  Have leaves, snow, and ice cleared regularly.  Use sand or salt on walkways during winter months.  In the garage, clean up grease or oil spills. BATHROOM  Install night lights.  Install grab bars by the toilet and in the tub and shower.  Use non-skid mats or decals in the tub or shower.  Place a plastic non-slip stool in the shower to sit on, if needed.  Keep floors dry and clean up all water on the floor immediately.  Remove soap buildup in the tub or shower on a regular basis.  Secure bath mats with non-slip, double-sided rug tape.  Remove throw rugs and tripping hazards from the floors. BEDROOMS  Install night lights.  Make sure a bedside light is easy to reach.  Do not use  oversized bedding.  Keep a telephone by your bedside.  Have a firm chair with side arms to use for getting dressed.  Remove throw rugs and tripping hazards from the floor. KITCHEN  Keep handles on pots and pans turned toward the center of the stove. Use back burners when possible.  Clean up spills quickly and allow time for drying.  Avoid walking on wet floors.  Avoid hot utensils and knives.  Position shelves so they are not too high or low.  Place commonly used objects within easy reach.  If necessary, use a sturdy step stool with a grab bar when reaching.  Keep electrical cables out of the way.  Do not use floor polish or wax that makes floors slippery. If you must use wax, use non-skid floor wax.  Remove throw rugs and tripping hazards from the floor. STAIRWAYS  Never leave objects on stairs.  Place handrails on both sides of stairways and use them. Fix any loose handrails. Make sure handrails on both sides of the stairways are as long as the stairs.  Check carpeting to make sure it is firmly attached along stairs. Make repairs to worn or loose carpet promptly.  Avoid placing throw rugs at the top or bottom of stairways, or properly secure the rug with carpet tape to prevent slippage. Get rid of  throw rugs, if possible.  Have an electrician put in a light switch at the top and bottom of the stairs. OTHER FALL PREVENTION TIPS  Wear low-heel or rubber-soled shoes that are supportive and fit well. Wear closed toe shoes.  When using a stepladder, make sure it is fully opened and both spreaders are firmly locked. Do not climb a closed stepladder.  Add color or contrast paint or tape to grab bars and handrails in your home. Place contrasting color strips on first and last steps.  Learn and use mobility aids as needed. Install an electrical emergency response system.  Turn on lights to avoid dark areas. Replace light bulbs that burn out immediately. Get light switches  that glow.  Arrange furniture to create clear pathways. Keep furniture in the same place.  Firmly attach carpet with non-skid or double-sided tape.  Eliminate uneven floor surfaces.  Select a carpet pattern that does not visually hide the edge of steps.  Be aware of all pets. OTHER HOME SAFETY TIPS  Set the water temperature for 120 F (48.8 C).  Keep emergency numbers on or near the telephone.  Keep smoke detectors on every level of the home and near sleeping areas. Document Released: 06/02/2002 Document Revised: 12/12/2011 Document Reviewed: 09/01/2011 [facility] Patient Information 2014 [place].   Follow-Up Instructions: No follow-ups on file.   Orders:  No orders of the defined types were placed in this encounter.  No orders of the defined types were placed in this encounter.     Procedures: No procedures performed   Clinical Data: Findings:  MRI cervical spine with biforamenal stenosis C3-4 and left C7 greater than bilat C4-5. Mild to mod central stenosis C3-4 and C4-5 greater than C2-3 Effusion in the facet joints C2-3 and C3-4.     Subjective: Chief Complaint  Patient presents with  . Neck - Follow-up  . Lower Back - Follow-up  . Right Hip - Follow-up    67 year old right handed female seen today in follow up to her complaints of neck pain left posterior auricular heachaches and occipital headaches.  Symptoms of falling and hand clumbsiness and left arm pain than seems to be worsening with extension gradually going down the left arm.   Review of Systems  Constitutional: Negative.   HENT: Negative.   Eyes: Negative.   Respiratory: Negative.   Cardiovascular: Negative.   Gastrointestinal: Negative.   Endocrine: Negative.   Genitourinary: Negative.   Musculoskeletal: Negative.   Skin: Negative.   Allergic/Immunologic: Negative.   Neurological: Negative.   Hematological: Negative.   Psychiatric/Behavioral: Negative.       Objective: Vital Signs: BP 138/78   Pulse 95   Ht 5' (1.524 m)   Wt 146 lb (66.2 kg)   BMI 28.51 kg/m   Physical Exam  Constitutional: She is oriented to person, place, and time. She appears well-developed and well-nourished.  HENT:  Head: Normocephalic and atraumatic.  Eyes: Pupils are equal, round, and reactive to light. EOM are normal.  Neck: Normal range of motion. Neck supple.  Pulmonary/Chest: Effort normal and breath sounds normal.  Abdominal: Soft. Bowel sounds are normal.  Neurological: She is alert and oriented to person, place, and time.  Skin: Skin is warm and dry.  Psychiatric: She has a normal mood and affect. Her behavior is normal. Judgment and thought content normal.    Back Exam   Tenderness  The patient is experiencing tenderness in the cervical.  Range of Motion  Extension:  10 abnormal  Flexion:  20 abnormal  Lateral bend right:  30 abnormal  Lateral bend left:  30 abnormal  Rotation right:  30 abnormal  Rotation left:  30 abnormal   Muscle Strength  The patient has normal back strength. Right Quadriceps:  5/5  Left Quadriceps:  5/5  Right Hamstrings:  5/5  Left Hamstrings:  5/5   Tests  Straight leg raise right: negative Straight leg raise left: negative  Reflexes  Patellar: normal Achilles: normal Biceps: normal Babinski's sign: normal   Other  Toe walk: normal Heel walk: normal Sensation: normal Gait: short leg  Erythema: no back redness Scars: absent  Comments:  Left finger extension strength and wrist volar flexion is normal.       Specialty Comments:  No specialty comments available.  Imaging: No results found.   PMFS History: Patient Active Problem List   Diagnosis Date Noted  . Vitamin D deficiency 11/05/2017  . Encounter for hepatitis C virus screening test for high risk patient 11/05/2017  . Dysphagia 09/03/2017  . Thrombocytopenia ([place]) 09/03/2017  . Healthcare maintenance 07/30/2017  . Chronic  pain syndrome 07/30/2017  . Chest pain 07/30/2017  . Parkinson's disease ([place]) 07/30/2017  . Hypertension 07/30/2017  . Severe obesity (BMI >= 40) ([place]) 05/15/2016  . Severe episode of recurrent major depressive disorder, without psychotic features ([place]) 05/15/2016  . Tremor of both hands 05/15/2016   Past Medical History:  Diagnosis Date  . Angina pectoris ([place])   . Duodenal ulcer   . Hypertension   . Neuropathy   . Parkinson's disease ([place])     Family History  Problem Relation Age of Onset  . Cancer Mother        ovarian  . Hyperlipidemia Mother   . Alzheimer's disease Mother   . Arthritis Mother        rheumatoid  . Cancer Father        lung  . Parkinson's disease Father   . Alzheimer's disease Father   . ALS Sister   . Cancer Maternal Aunt        pancreatic, lung, liver,breast  . Cancer Maternal Uncle        pancreatic, liver, brain  . Heart attack Paternal Aunt   . Stroke Paternal Aunt   . Heart attack Paternal Uncle   . Stroke Paternal Grandmother   . Heart attack Paternal Grandfather   . Arthritis Sister        rheumatoid  . Huntington's disease Daughter        presumed inherited from father per patient    Past Surgical History:  Procedure Laterality Date  . ABDOMINAL HYSTERECTOMY    . BACK SURGERY    . GASTROSTOMY     peptic ulcer   Social History   Occupational History  . Not on file  Tobacco Use  . Smoking status: Former Smoker    Packs/day: 1.00    Years: 22.00    Pack years: 22.00    Types: Cigarettes    Last attempt to quit: 06/26/1986    Years since quitting: 31.4  . Smokeless tobacco: Never Used  Substance and Sexual Activity  . Alcohol use: No  . Drug use: No  . Sexual activity: Not Currently    Birth control/protection: None

## 2017-11-23 ENCOUNTER — Ambulatory Visit (INDEPENDENT_AMBULATORY_CARE_PROVIDER_SITE_OTHER): Payer: Medicare HMO | Admitting: Gastroenterology

## 2017-11-23 ENCOUNTER — Encounter: Payer: Self-pay | Admitting: Gastroenterology

## 2017-11-23 VITALS — BP 122/80 | HR 80 | Ht 60.0 in | Wt 147.2 lb

## 2017-11-23 DIAGNOSIS — R1310 Dysphagia, unspecified: Secondary | ICD-10-CM | POA: Diagnosis not present

## 2017-11-23 DIAGNOSIS — K219 Gastro-esophageal reflux disease without esophagitis: Secondary | ICD-10-CM

## 2017-11-23 MED ORDER — OMEPRAZOLE 20 MG PO CPDR: 20.0000 mg | DELAYED_RELEASE_CAPSULE | Freq: Every day | ORAL | 3 refills | Status: DC

## 2017-11-23 NOTE — Progress Notes (Signed)
Chief Complaint: dysphagia  Referring Provider:  [name], NP      ASSESSMENT AND PLAN;   #1. Esophageal Dysphagia (with component of oropharyngeal dysphagia, ?? Has parkinson's). Has PEG placed in 2013 (removed after a year). - Ba swallow with Ba tab - Then, if needed, EGD with dilatation off plavix x 5 days (pt has low borderline platelet count as well). - Proceed with cardiac WU as planned with Dr Raliegh Ip. - Omeprazole 20mg  po qd. - I have instructed patient that she needs to chew food especially meats and breads well and eat slowly. Take all medicines in standing position.  #2. GERD with history of erosive esophagitis with H/O Schatzki ring. Pt had food impaction in 2003 status post endoscopic disimpaction followed by EGD with esophageal dilatation.   HPI:    Amy Moon is a 67 y.o. female  With dysphagia mostly to solids  Feels like food is getting hung up in mid chest, and upper esophagus Getting worse over last 6 months Mostly with solids like breads, veggies, meats. Occ heartburn. Undergoing cardiac work-up -negative 2D echo, scheduled for a questionable stress test next week. Negative melena or hematochezia. Has questionable history of Parkinson's disease and had a PEG/PEJ in the past. No abdominal pain  ROS: constipation (never had colonoscopy - had refused in past) - trying smoothmove. No nausea, vomiting, regurgitation, odynophagia.  There is no melena or hematochezia. No unintentional weight loss.     Past Medical History:  Diagnosis Date  . Angina pectoris ([place])   . Chronic kidney disease   . Drug-induced low platelet count   . Duodenal ulcer   . Granulomatous disease ([place])   . Hepatic disease   . Hypertension   . Low vitamin D level   . Neuropathy   . Osteopenia   . Parkinson's disease ([place])   . Portal hypertension ([place])   . Splenomegaly a    Past Surgical History:  Procedure Laterality Date  . ABDOMINAL HYSTERECTOMY    . APPENDECTOMY     . BACK SURGERY    . GASTROSTOMY     peptic ulcer    Family History  Problem Relation Age of Onset  . Cancer Mother        ovarian  . Hyperlipidemia Mother   . Alzheimer's disease Mother   . Arthritis Mother        rheumatoid  . Cancer Father        lung  . Parkinson's disease Father   . Alzheimer's disease Father   . ALS Sister   . Cancer Maternal Aunt        pancreatic, lung, liver,breast  . Cancer Maternal Uncle        pancreatic, liver, brain  . Heart attack Paternal Aunt   . Stroke Paternal Aunt   . Heart attack Paternal Uncle   . Stroke Paternal Grandmother   . Heart attack Paternal Grandfather   . Arthritis Sister        rheumatoid  . Huntington's disease Daughter        presumed inherited from father per patient    Social History   Tobacco Use  . Smoking status: Former Smoker    Packs/day: 1.00    Years: 22.00    Pack years: 22.00    Types: Cigarettes    Last attempt to quit: 06/26/1986    Years since quitting: 31.4  . Smokeless tobacco: Never Used  Substance Use Topics  . Alcohol use:  No  . Drug use: No    Current Outpatient Medications  Medication Sig Dispense Refill  . clopidogrel (PLAVIX) 75 MG tablet Take 1 tablet (75 mg total) by mouth daily. 30 tablet 3  . diphenhydrAMINE (BENADRYL) 50 MG tablet Take 50 mg by mouth at bedtime as needed for itching.    . gabapentin (NEURONTIN) 300 MG capsule Take one(100 mg) capsule po qAM and Two capsules(total of 200mg ) po qHS 60 capsule 2  . isosorbide mononitrate (IMDUR) 30 MG 24 hr tablet Take 1 tablet (30 mg total) by mouth daily. 30 tablet 6  . nitroGLYCERIN (NITROSTAT) 0.4 MG SL tablet Place 1 tablet (0.4 mg total) under the tongue every 5 (five) minutes as needed for chest pain. 25 tablet 11  . ranolazine (RANEXA) 500 MG 12 hr tablet Take 1 tablet (500 mg total) by mouth 2 (two) times daily. 60 tablet 3  . traMADol (ULTRAM) 50 MG tablet Take 1 tablet (50 mg total) by mouth every 6 (six) hours as needed  for moderate pain. 30 tablet 0  . Vitamin D, Ergocalciferol, (DRISDOL) 50000 units CAPS capsule Take 1 capsule (50,000 Units total) by mouth every 7 (seven) days. 16 capsule 0   No current facility-administered medications for this visit.     Allergies  Allergen Reactions  . Nsaids Other (See Comments)    History of bleeding ulcers  . Inderal [Propranolol]   . Indomethacin Other (See Comments)    Unknown  . Penicillin G Other (See Comments)    Unknown  . Pneumococcal Vaccines Other (See Comments)    Caused "pneumonia"  . Scopolamine     The patch, caused her to pass out / change in mental status  . Sinemet [Carbidopa W-Levodopa] Nausea And Vomiting  . Tape   . Latex Hives and Rash    Review of Systems:  Constitutional: Denies fever, chills, diaphoresis, appetite change. Has fatigue.  HEENT: Denies photophobia, eye pain, redness, hearing loss, ear pain, congestion, sore throat, rhinorrhea, sneezing, mouth sores, neck pain, neck stiffness and tinnitus.   Respiratory: Denies SOB, DOE, cough, chest tightness,  and wheezing.   Cardiovascular: Denies chest pain, palpitations and leg swelling.  Genitourinary: Denies dysuria, urgency, frequency, hematuria, flank pain and difficulty urinating.  Musculoskeletal: Has myalgias, back pain, joint swelling, arthralgias and gait problem.  Skin: No rash.  Neurological: Denies dizziness, seizures, syncope, weakness, light-headedness, numbness. Has headaches and   Hematological: Denies adenopathy. Easy bruising, personal or family bleeding history  Psychiatric/Behavioral: Has anxiety or depression     Physical Exam:    BP 122/80   Pulse 80   Ht 5' (1.524 m)   Wt 147 lb 4 oz (66.8 kg)   BMI 28.76 kg/m  Filed Weights   11/23/17 1035  Weight: 147 lb 4 oz (66.8 kg)   Constitutional:  Well-developed, in no acute distress. Psychiatric: Normal mood and affect. Behavior is normal. HEENT: Pupils normal.  Conjunctivae are normal. No scleral  icterus. Neck supple.  Cardiovascular: Normal rate, regular rhythm. No edema Pulmonary/chest: Effort normal and breath sounds normal. No wheezing, rales or rhonchi. Abdominal: Soft, nondistended. Nontender. Bowel sounds active throughout. There are no masses palpable. No hepatomegaly. Well  Rectal:  defered Neurological: Alert and oriented to person place and time. Skin: Skin is warm and dry. No rashes noted.  Easy bruisability  Data Reviewed: I have personally reviewed following labs and imaging studies  CBC: CBC Latest Ref Rng & Units 11/05/2017 08/06/2017 11/26/2015  WBC 3.4 -  10.8 x10E3/uL 4.8 4.6 5.0  Hemoglobin 11.1 - 15.9 g/dL 12.2 13.2 12.9  Hematocrit 34.0 - 46.6 % 35.2 37.4 37.6  Platelets 150 - 379 x10E3/uL 134(L) 134(L) 92(L)    CMP: CMP Latest Ref Rng & Units 08/06/2017 11/26/2015 10/30/2015  Glucose 65 - 99 mg/dL 104(H) 88 99  BUN 8 - 27 mg/dL 7(L) 11 15  Creatinine 0.57 - 1.00 mg/dL 0.60 0.57 0.60  Sodium 134 - 144 mmol/L 143 140 142  Potassium 3.5 - 5.2 mmol/L 4.2 3.8 3.7  Chloride 96 - 106 mmol/L 104 105 104  CO2 20 - 29 mmol/L 24 27 29   Calcium 8.7 - 10.3 mg/dL 10.0 9.0 9.3  Total Protein 6.0 - 8.5 g/dL 7.5 7.2 7.7  Total Bilirubin 0.0 - 1.2 mg/dL 0.9 1.1 1.5(H)  Alkaline Phos 39 - 117 IU/L 94 80 96  AST 0 - 40 IU/L 17 30 54(H)  ALT 0 - 32 IU/L 17 25 30      Carmell Austria, MD 11/23/2017, 10:47 AM  Cc: [name], NP

## 2017-11-23 NOTE — Patient Instructions (Signed)
If you are age 69 or older, your body mass index should be between 23-30. Your Body mass index is 28.76 kg/m. If this is out of the aforementioned range listed, please consider follow up with your Primary Care Provider.  If you are age 81 or younger, your body mass index should be between 19-25. Your Body mass index is 28.76 kg/m. If this is out of the aformentioned range listed, please consider follow up with your Primary Care Provider.    You have been scheduled for a Barium Esophogram at [facility] (1st floor of the hospital) on 6/112/19 at 10:30am. Please arrive 15 minutes prior to your appointment for registration. Make certain not to have anything to eat or drink 6 hours prior to your test. If you need to reschedule for any reason, please contact radiology at [phone] to do so. __________________________________________________________________ A barium swallow is an examination that concentrates on views of the esophagus. This tends to be a double contrast exam (barium and two liquids which, when combined, create a gas to distend the wall of the oesophagus) or single contrast (non-ionic iodine based). The study is usually tailored to your symptoms so a good history is essential. Attention is paid during the study to the form, structure and configuration of the esophagus, looking for functional disorders (such as aspiration, dysphagia, achalasia, motility and reflux) EXAMINATION You may be asked to change into a gown, depending on the type of swallow being performed. A radiologist and radiographer will perform the procedure. The radiologist will advise you of the type of contrast selected for your procedure and direct you during the exam. You will be asked to stand, sit or lie in several different positions and to hold a small amount of fluid in your mouth before being asked to swallow while the imaging is performed .In some instances you may be asked to swallow barium coated  marshmallows to assess the motility of a solid food bolus. The exam can be recorded as a digital or video fluoroscopy procedure. POST PROCEDURE It will take 1-2 days for the barium to pass through your system. To facilitate this, it is important, unless otherwise directed, to increase your fluids for the next 24-48hrs and to resume your normal diet.  This test typically takes about 30 minutes to perform. _______________________________________________________________________________  We have sent the following medications to your pharmacy for you to pick up at your convenience: Omeprazole 20 mg once daily  Please purchase the following medications over the counter and take as directed: Smooth Move (Herbal Tea) at bedtime.  Thank you,  Dr. Jackquline Denmark

## 2017-11-26 ENCOUNTER — Ambulatory Visit (HOSPITAL_COMMUNITY): Payer: Medicare HMO | Attending: Cardiology

## 2017-11-26 VITALS — Ht 60.0 in | Wt 144.0 lb

## 2017-11-26 DIAGNOSIS — R079 Chest pain, unspecified: Secondary | ICD-10-CM | POA: Insufficient documentation

## 2017-11-26 LAB — MYOCARDIAL PERFUSION IMAGING
CHL CUP NUCLEAR SDS: 7
CHL CUP NUCLEAR SRS: 7
CHL CUP NUCLEAR SSS: 14
LHR: 0.21
LV dias vol: 57 mL (ref 46–106)
LV sys vol: 13 mL
NUC STRESS TID: 0.74
Peak HR: 95 {beats}/min
Rest HR: 68 {beats}/min

## 2017-11-26 MED ORDER — TECHNETIUM TC 99M TETROFOSMIN IV KIT
10.1000 | PACK | Freq: Once | INTRAVENOUS | Status: AC | PRN
  Administered 2017-11-26: 10.1 via INTRAVENOUS
  Filled 2017-11-26: qty 11

## 2017-11-26 MED ORDER — REGADENOSON 0.4 MG/5ML IV SOLN
0.4000 mg | Freq: Once | INTRAVENOUS | Status: AC
Start: 2017-11-26 — End: 2017-11-26
  Administered 2017-11-26: 0.4 mg via INTRAVENOUS

## 2017-11-26 MED ORDER — TECHNETIUM TC 99M TETROFOSMIN IV KIT
31.5000 | PACK | Freq: Once | INTRAVENOUS | Status: AC | PRN
  Administered 2017-11-26: 31.5 via INTRAVENOUS
  Filled 2017-11-26: qty 32

## 2017-11-27 ENCOUNTER — Encounter (INDEPENDENT_AMBULATORY_CARE_PROVIDER_SITE_OTHER): Payer: Self-pay

## 2017-11-28 ENCOUNTER — Ambulatory Visit
Admission: RE | Admit: 2017-11-28 | Discharge: 2017-11-28 | Disposition: A | Discharge status: Home / Self Care | Payer: Medicare HMO | Source: Ambulatory Visit | Attending: Neurology | Admitting: Neurology

## 2017-11-28 DIAGNOSIS — R270 Ataxia, unspecified: Secondary | ICD-10-CM

## 2017-11-29 ENCOUNTER — Telehealth: Payer: Self-pay | Admitting: Neurology

## 2017-11-29 NOTE — Telephone Encounter (Signed)
-----   Message from [place], DO sent at 11/29/2017  7:23 AM EDT ----- Reviewed.  Few rare t2 hyperintensities.  Jade, let pt know that her MRI brain is normal for age

## 2017-11-29 NOTE — Telephone Encounter (Signed)
Mychart message sent to patient.

## 2017-12-04 ENCOUNTER — Telehealth (INDEPENDENT_AMBULATORY_CARE_PROVIDER_SITE_OTHER): Payer: Self-pay | Admitting: Specialist

## 2017-12-04 NOTE — Telephone Encounter (Signed)
Please call.

## 2017-12-05 ENCOUNTER — Ambulatory Visit (HOSPITAL_COMMUNITY)
Admission: RE | Admit: 2017-12-05 | Discharge: 2017-12-05 | Disposition: A | Discharge status: Home / Self Care | Payer: Medicare HMO | Source: Ambulatory Visit | Attending: Gastroenterology | Admitting: Gastroenterology

## 2017-12-05 DIAGNOSIS — K219 Gastro-esophageal reflux disease without esophagitis: Secondary | ICD-10-CM | POA: Insufficient documentation

## 2017-12-05 DIAGNOSIS — R1310 Dysphagia, unspecified: Secondary | ICD-10-CM | POA: Insufficient documentation

## 2017-12-05 DIAGNOSIS — K449 Diaphragmatic hernia without obstruction or gangrene: Secondary | ICD-10-CM | POA: Insufficient documentation

## 2017-12-06 ENCOUNTER — Other Ambulatory Visit: Payer: Self-pay

## 2017-12-10 ENCOUNTER — Ambulatory Visit: Payer: Medicare HMO | Admitting: Cardiology

## 2017-12-10 ENCOUNTER — Telehealth (INDEPENDENT_AMBULATORY_CARE_PROVIDER_SITE_OTHER): Payer: Self-pay | Admitting: Specialist

## 2017-12-10 NOTE — Telephone Encounter (Signed)
Patient called stating that she has been trying to get in touch with Dr. Louanne Skye or his office.  CB#[phone].  Thank you.

## 2017-12-10 NOTE — Telephone Encounter (Signed)
I called it looks like Amy Moon has been trying to reach patient to schedule inject, I got her sched for 01/08/18 @ 115.  She will have a driver with her

## 2017-12-11 ENCOUNTER — Ambulatory Visit: Payer: Medicare HMO | Admitting: Cardiology

## 2018-01-03 NOTE — Telephone Encounter (Signed)
Opened in error

## 2018-01-08 ENCOUNTER — Ambulatory Visit (INDEPENDENT_AMBULATORY_CARE_PROVIDER_SITE_OTHER): Payer: Self-pay

## 2018-01-08 ENCOUNTER — Encounter (INDEPENDENT_AMBULATORY_CARE_PROVIDER_SITE_OTHER): Payer: Self-pay | Admitting: Physical Medicine and Rehabilitation

## 2018-01-08 ENCOUNTER — Ambulatory Visit (INDEPENDENT_AMBULATORY_CARE_PROVIDER_SITE_OTHER): Payer: Medicare HMO | Admitting: Physical Medicine and Rehabilitation

## 2018-01-08 VITALS — BP 156/89 | HR 90

## 2018-01-08 DIAGNOSIS — M542 Cervicalgia: Secondary | ICD-10-CM | POA: Diagnosis not present

## 2018-01-08 DIAGNOSIS — M47812 Spondylosis without myelopathy or radiculopathy, cervical region: Secondary | ICD-10-CM

## 2018-01-08 MED ORDER — METHYLPREDNISOLONE ACETATE 80 MG/ML IJ SUSP
80.0000 mg | Freq: Once | INTRAMUSCULAR | Status: AC
  Administered 2018-01-08: 80 mg

## 2018-01-08 NOTE — Procedures (Signed)
Cervical Facet Joint Intra-Articular Injection with Fluoroscopic Guidance  Patient: Amy Moon      Date of Birth: June 15, 1951 MRN: [id number] PCP: [name], NP      Visit Date: 01/08/2018   Universal Protocol:    Date/Time: 07/16/191:06 PM  Consent Given By: the patient  Position: PRONE  Additional Comments: Vital signs were monitored before and after the procedure. Patient was prepped and draped in the usual sterile fashion. The correct patient, procedure, and site was verified.   Injection Procedure Details:  Procedure Site One Meds Administered:  Meds ordered this encounter  Medications  . methylPREDNISolone acetate (DEPO-MEDROL) injection 80 mg     Laterality: Left, Right  Location/Site:  C2-3, C3-4 respectively as above  Needle size: 25 G  Needle type: Spinal  Needle Placement: Articular  Findings:  -Contrast Used: 0.5 mL iohexol 180 mg iodine/mL   -Comments: Excellent flow of contrast producing a partial arthrogram.  Procedure Details: The region overlying the facet joints mentioned above were localized under fluoroscopic visualization. The needle was inserted down to the level of the lateral mass of the superior articular process of the facet joint to be injected. Then, the needle was "walked off" inferiorly into the lateral aspect of the facet joint. Bi-planar images were used for confirming placement and spot radiographs were documented.  A 0.25 ml volume of Omnipaque-240 was injected into the facet joint and a standard partial arthrogram was obtained. Radiographs were obtained of the arthrogram. A 0.5 ml. volume of the steroid/anesthetic solution was injected into the joint. This procedure was repeated for each facet joint injected.   Additional Comments:  The patient tolerated the procedure well Dressing: Band-Aid    Post-procedure details: Patient was observed during the procedure. Post-procedure instructions were reviewed.  Patient left  the clinic in stable condition.

## 2018-01-08 NOTE — Patient Instructions (Signed)

## 2018-01-08 NOTE — Progress Notes (Signed)
 .  Numeric Pain Rating Scale and Functional Assessment Average Pain 7   In the last MONTH (on 0-10 scale) has pain interfered with the following?  1. General activity like being  able to carry out your everyday physical activities such as walking, climbing stairs, carrying groceries, or moving a chair?  Rating(4)   +Driver, +BT(ok for plavix), -Dye Allergies.

## 2018-01-10 ENCOUNTER — Ambulatory Visit (INDEPENDENT_AMBULATORY_CARE_PROVIDER_SITE_OTHER): Payer: Medicare HMO | Admitting: Specialist

## 2018-01-15 ENCOUNTER — Ambulatory Visit: Payer: Medicare HMO | Admitting: Gastroenterology

## 2018-01-15 ENCOUNTER — Ambulatory Visit: Payer: Medicare HMO | Admitting: Cardiology

## 2018-01-21 NOTE — Progress Notes (Signed)
Amy Moon - 67 y.o. female MRN [id number]  Date of birth: 05/04/1951  Office Visit Note: Visit Date: 01/08/2018 PCP: [name], NP Referred by: [name], NP  Subjective: Chief Complaint  Patient presents with  . Neck - Pain  . Right Shoulder - Pain  . Left Shoulder - Pain  . Right Arm - Pain  . Left Arm - Pain   HPI: Amy Moon is a 67 year old female with chronic worsening neck pain radiating into the both shoulders.  She reports moving her neck and rotation and extension make the pain worse.  She comes in today for planned facet joint block as requested by Dr. Basil Dess.  He requested left C2-3 and 3-4.  Because she is having bilateral symptoms and the MRI shows facet edema on the left at C2-3 and on the right at C3-4 we actually gave the facet joint block accordingly so she did have one on the left at C2-3 and one on the right at C3-4.   ROS Otherwise per HPI.  Assessment & Plan: Visit Diagnoses:  1. Cervical spondylosis without myelopathy   2. Cervicalgia     Plan: No additional findings.   Meds & Orders:  Meds ordered this encounter  Medications  . methylPREDNISolone acetate (DEPO-MEDROL) injection 80 mg    Orders Placed This Encounter  Procedures  . Facet Injection  . XR C-ARM NO REPORT    Follow-up: Return for Dr. Louanne Skye.   Procedures: No procedures performed  Cervical Facet Joint Intra-Articular Injection with Fluoroscopic Guidance  Patient: Amy Moon      Date of Birth: 03/25/1951 MRN: [id number] PCP: [name], NP      Visit Date: 01/08/2018   Universal Protocol:    Date/Time: 07/16/191:06 PM  Consent Given By: the patient  Position: PRONE  Additional Comments: Vital signs were monitored before and after the procedure. Patient was prepped and draped in the usual sterile fashion. The correct patient, procedure, and site was verified.   Injection Procedure Details:  Procedure Site One Meds Administered:  Meds ordered  this encounter  Medications  . methylPREDNISolone acetate (DEPO-MEDROL) injection 80 mg     Laterality: Left, Right  Location/Site:  C2-3, C3-4 respectively as above  Needle size: 25 G  Needle type: Spinal  Needle Placement: Articular  Findings:  -Contrast Used: 0.5 mL iohexol 180 mg iodine/mL   -Comments: Excellent flow of contrast producing a partial arthrogram.  Procedure Details: The region overlying the facet joints mentioned above were localized under fluoroscopic visualization. The needle was inserted down to the level of the lateral mass of the superior articular process of the facet joint to be injected. Then, the needle was "walked off" inferiorly into the lateral aspect of the facet joint. Bi-planar images were used for confirming placement and spot radiographs were documented.  A 0.25 ml volume of Omnipaque-240 was injected into the facet joint and a standard partial arthrogram was obtained. Radiographs were obtained of the arthrogram. A 0.5 ml. volume of the steroid/anesthetic solution was injected into the joint. This procedure was repeated for each facet joint injected.   Additional Comments:  The patient tolerated the procedure well Dressing: Band-Aid    Post-procedure details: Patient was observed during the procedure. Post-procedure instructions were reviewed.  Patient left the clinic in stable condition.   Clinical History: MRI CERVICAL SPINE WITHOUT CONTRAST  TECHNIQUE: Multiplanar, multisequence MR imaging of the cervical spine was performed. No intravenous contrast was administered.  COMPARISON:  Cervical spine radiographs 10/03/2017.  FINDINGS: Alignment: Improved cervical lordosis compared to the radiographs last month. Mild straightening of lordosis. No spondylolisthesis.  Vertebrae: Heterogeneous endplate marrow signal appears to be degenerative in nature throughout much of the cervical spine. There are areas of mild endplate marrow  edema which appears degenerative (posteriorly at C3-C4). Furthermore, there is confluent marrow edema in the left C2-C3 and right C3-C4 facets (series 4, image 1 on the right). These facets are chronically degenerated.  No other acute osseous abnormality identified.  Cord: Spinal cord signal is within normal limits at all visualized levels.  Posterior Fossa, vertebral arteries, paraspinal tissues: Negative visible brain parenchyma. Cervicomedullary junction is within normal limits. Preserved major vascular flow voids in the neck. Several small T2 hyperintense thyroid nodules measure up to 11 mm and do not meet size criteria for ultrasound follow-up. Negative visible lung apices.  Disc levels:  C2-C3: Broad-based mild disc bulging. Moderate facet hypertrophy greater on the left, which corresponds to the site of facet marrow edema. Mild to moderate ligament flavum hypertrophy. Mild spinal stenosis. Mild cord mass effect. Mild left C3 foraminal stenosis.  C3-C4: Disc space loss with circumferential disc osteophyte complex. Broad-based posterior component. Moderate facet and ligament flavum hypertrophy greater on the right, where there is associated degenerative marrow edema. Spinal stenosis with mild cord mass effect. Moderate to severe bilateral C4 foraminal stenosis greater on the right.  C4-C5: Circumferential disc bulge and endplate spurring with broad-based posterior and biforaminal involvement. Mild facet and ligament flavum hypertrophy. Mild spinal stenosis with no cord mass effect. Moderate bilateral C5 foraminal stenosis.  C5-C6: Circumferential disc bulge with mild endplate spurring. Broad-based left foraminal involvement. Mild facet hypertrophy greater on the left. No spinal stenosis. Mild to moderate left and mild right C6 foraminal stenosis.  C6-C7: Circumferential disc bulge with broad-based left foraminal component (series 2, image 10). Endplate  spurring. Mild facet and ligament flavum hypertrophy. No spinal stenosis. Moderate to severe left C7 neural foraminal stenosis.  C7-T1:  Mild facet hypertrophy.  No stenosis.  Thoracic findings are reported separately today.  IMPRESSION: 1. Marrow edema suggesting acute exacerbation of chronic facet joint arthritis at the left C2-C3 and right C3-C4 levels. There is some associated posterior and rightward degenerative endplate marrow edema at C3-C4. 2. Multifactorial spinal stenosis with up to mild spinal cord mass effect C2-C3 through C4-C5. No cord signal abnormality. 3. Moderate or severe neural foraminal stenosis at the bilateral C4, left C7, and to a lesser extent bilateral C5 nerve levels.   Electronically Signed   By: Genevie Ann M.D.   On: 10/24/2017 09:24   She reports that she quit smoking about 31 years ago. Her smoking use included cigarettes. She has a 22.00 pack-year smoking history. She has never used smokeless tobacco.  Recent Labs    08/06/17 0907 10/03/17 1027  HGBA1C 4.6*  --   LABURIC  --  3.8    Objective:  VS:  HT:    WT:   BMI:     BP:(!) 156/89  HR:90bpm  TEMP: ( )  RESP:  Physical Exam  Ortho Exam Imaging: No results found.  Past Medical/Family/Surgical/Social History: Medications & Allergies reviewed per EMR, new medications updated. Patient Active Problem List   Diagnosis Date Noted  . Vitamin D deficiency 11/05/2017  . Encounter for hepatitis C virus screening test for high risk patient 11/05/2017  . Dysphagia 09/03/2017  . Thrombocytopenia ([place]) 09/03/2017  . Healthcare maintenance 07/30/2017  . Chronic  pain syndrome 07/30/2017  . Chest pain 07/30/2017  . Parkinson's disease ([place]) 07/30/2017  . Hypertension 07/30/2017  . Severe obesity (BMI >= 40) ([place]) 05/15/2016  . Severe episode of recurrent major depressive disorder, without psychotic features ([place]) 05/15/2016  . Tremor of both hands 05/15/2016   Past Medical History:    Diagnosis Date  . Angina pectoris ([place])   . Chronic kidney disease   . Drug-induced low platelet count   . Duodenal ulcer   . Granulomatous disease ([place])   . Hepatic disease   . Hypertension   . Low vitamin D level   . Neuropathy   . Osteopenia   . Parkinson's disease ([place])   . Portal hypertension ([place])   . Splenomegaly a   Family History  Problem Relation Age of Onset  . Cancer Mother        ovarian  . Hyperlipidemia Mother   . Alzheimer's disease Mother   . Arthritis Mother        rheumatoid  . Cancer Father        lung  . Parkinson's disease Father   . Alzheimer's disease Father   . ALS Sister   . Cancer Maternal Aunt        pancreatic, lung, liver,breast  . Cancer Maternal Uncle        pancreatic, liver, brain  . Heart attack Paternal Aunt   . Stroke Paternal Aunt   . Heart attack Paternal Uncle   . Stroke Paternal Grandmother   . Heart attack Paternal Grandfather   . Arthritis Sister        rheumatoid  . Huntington's disease Daughter        presumed inherited from father per patient   Past Surgical History:  Procedure Laterality Date  . ABDOMINAL HYSTERECTOMY    . APPENDECTOMY    . BACK SURGERY    . GASTROSTOMY     peptic ulcer   Social History   Occupational History  . Not on file  Tobacco Use  . Smoking status: Former Smoker    Packs/day: 1.00    Years: 22.00    Pack years: 22.00    Types: Cigarettes    Last attempt to quit: 06/26/1986    Years since quitting: 31.5  . Smokeless tobacco: Never Used  Substance and Sexual Activity  . Alcohol use: No  . Drug use: No  . Sexual activity: Not Currently    Birth control/protection: None

## 2018-01-23 ENCOUNTER — Ambulatory Visit (INDEPENDENT_AMBULATORY_CARE_PROVIDER_SITE_OTHER): Payer: Medicare HMO | Admitting: Specialist

## 2018-02-05 ENCOUNTER — Other Ambulatory Visit: Payer: Self-pay | Admitting: Cardiology

## 2018-02-22 ENCOUNTER — Encounter: Payer: Self-pay | Admitting: Gastroenterology

## 2018-03-06 ENCOUNTER — Other Ambulatory Visit (INDEPENDENT_AMBULATORY_CARE_PROVIDER_SITE_OTHER): Payer: Self-pay | Admitting: Specialist

## 2018-03-06 NOTE — Telephone Encounter (Signed)
Tramadol refill request 

## 2018-03-07 NOTE — Telephone Encounter (Signed)
Tramadol refill request 

## 2018-03-07 NOTE — Telephone Encounter (Signed)
Called tramadol to Pharm

## 2018-03-08 ENCOUNTER — Telehealth: Payer: Self-pay

## 2018-03-08 ENCOUNTER — Ambulatory Visit (INDEPENDENT_AMBULATORY_CARE_PROVIDER_SITE_OTHER): Payer: Medicare HMO | Admitting: Gastroenterology

## 2018-03-08 ENCOUNTER — Encounter: Payer: Self-pay | Admitting: Gastroenterology

## 2018-03-08 VITALS — BP 142/82 | HR 89 | Ht 60.0 in | Wt 147.5 lb

## 2018-03-08 DIAGNOSIS — K219 Gastro-esophageal reflux disease without esophagitis: Secondary | ICD-10-CM

## 2018-03-08 DIAGNOSIS — R1011 Right upper quadrant pain: Secondary | ICD-10-CM | POA: Diagnosis not present

## 2018-03-08 DIAGNOSIS — R161 Splenomegaly, not elsewhere classified: Secondary | ICD-10-CM | POA: Diagnosis not present

## 2018-03-08 DIAGNOSIS — R1310 Dysphagia, unspecified: Secondary | ICD-10-CM | POA: Diagnosis not present

## 2018-03-08 NOTE — Telephone Encounter (Signed)
[facility] Pre-operative Risk Assessment     Request for surgical clearance:     Endoscopy Procedure  What type of surgery is being performed?     EGD  When is this surgery scheduled?     04/05/18  What type of clearance is required ?   Pharmacy  Are there any medications that need to be held prior to surgery and how long? Plavix  Practice name and name of physician performing surgery?      [facility] Jackquline Denmark  What is your office phone and fax number?      Phone- [phone]  [phone]  Anesthesia type (None, local, MAC, general) ?       MAC

## 2018-03-08 NOTE — Telephone Encounter (Signed)
Called rx to pharm

## 2018-03-08 NOTE — Progress Notes (Signed)
IMPRESSION and PLAN:    #1. Esophageal Dysphagia (with component of oropharyngeal dysphagia, ?? Has parkinson's). Has PEJ placed in 2012 (removed 2017).  Ba swallow showing flash laryngeal penetration, distal esophageal mucosal ring, small hiatal hernia.  #2. GERD with history of erosive esophagitis with H/O Schatzki ring. Pt had food impaction in 2003 status post endoscopic disimpaction followed by EGD with esophageal dilatation.    #3. RUQ pain.  #4. History of splenomegaly with borderline platelet count.  No clear evidence of liver cirrhosis   Plan: - Proceed with EGD with dilatation off plavix x 5 days (pt has low borderline platelet count as well). - Omeprazole 20mg  po qd to continue. - I have instructed patient that she needs to chew food especially meats and breads well and eat slowly. Take all medicines in standing position. - US abdomen complete (to assess hepatobiliary system and spleen). - If still with problems, MBS follwed by esophageal manometry/pH study. - Cardiology clearence to hold plavix 5 days before EGD(Had recent 2DE and stress test with Dr Raliegh Ip) - Patient does not want PEG/PEJ ever.  HPI:    Chief Complaint:   Amy Moon is a 67 y.o. female  Very nice patient brings in cake for the staff today Still with dysphagia Wants to get EGD as it did help her in the past. We have discussed the barium swallow results Has been having right upper quadrant abdominal pain intermittently Not much related to eating. No nausea or vomiting. No fever chills or night sweats Denies having any melena or hematochezia.   Past Medical History:  Diagnosis Date  . Angina pectoris ([place])   . Chronic kidney disease   . Drug-induced low platelet count   . Duodenal ulcer   . Granulomatous disease ([place])   . Hepatic disease   . Hypertension   . Low vitamin D level   . Neuropathy   . Osteopenia   . Parkinson's disease ([place])       . Splenomegaly a    Current  Outpatient Medications  Medication Sig Dispense Refill  . clopidogrel (PLAVIX) 75 MG tablet TAKE 1 TABLET (75 MG TOTAL) BY MOUTH DAILY. 30 tablet 1  . diphenhydrAMINE (BENADRYL) 50 MG tablet Take 50 mg by mouth at bedtime as needed for itching.    . gabapentin (NEURONTIN) 100 MG capsule   3  . nitroGLYCERIN (NITROSTAT) 0.4 MG SL tablet Place 0.4 mg under the tongue as needed for chest pain.    Marland Kitchen omeprazole (PRILOSEC) 20 MG capsule Take 1 capsule (20 mg total) by mouth daily. 30 capsule 3  . ranolazine (RANEXA) 500 MG 12 hr tablet TAKE 1 TABLET BY MOUTH 2 TIMES DAILY. 60 tablet 1  . traMADol (ULTRAM) 50 MG tablet TAKE 1 TABLET BY MOUTH EVERY 6 HOURS AS NEEDED FOR MODERATE PAIN 30 tablet 0  . Vitamin D, Ergocalciferol, (DRISDOL) 50000 units CAPS capsule Take 1 capsule (50,000 Units total) by mouth every 7 (seven) days. 16 capsule 0   No current facility-administered medications for this visit.     Past Surgical History:  Procedure Laterality Date  . ABDOMINAL HYSTERECTOMY    . APPENDECTOMY    . BACK SURGERY    . ESOPHAGOGASTRODUODENOSCOPY  05/20/2008   Mild to moderate esophagitis. Otherwise normal EGD.   Marland Kitchen GASTROSTOMY     peptic ulcer    Family History  Problem Relation Age of Onset  . Cancer Mother  ovarian  . Hyperlipidemia Mother   . Alzheimer's disease Mother   . Arthritis Mother        rheumatoid  . Cancer Father        lung  . Parkinson's disease Father   . Alzheimer's disease Father   . ALS Sister   . Cancer Maternal Aunt        pancreatic, lung, liver,breast  . Cancer Maternal Uncle        pancreatic, liver, brain  . Heart attack Paternal Aunt   . Stroke Paternal Aunt   . Heart attack Paternal Uncle   . Stroke Paternal Grandmother   . Heart attack Paternal Grandfather   . Arthritis Sister        rheumatoid  . Huntington's disease Daughter        presumed inherited from father per patient  . Colon cancer Maternal Uncle     Social History   Tobacco  Use  . Smoking status: Former Smoker    Packs/day: 1.00    Years: 22.00    Pack years: 22.00    Types: Cigarettes    Last attempt to quit: 06/26/1986    Years since quitting: 31.7  . Smokeless tobacco: Never Used  Substance Use Topics  . Alcohol use: No  . Drug use: No    Allergies  Allergen Reactions  . Nsaids Other (See Comments)    History of bleeding ulcers  . Inderal [Propranolol]   . Indomethacin Other (See Comments)    Unknown  . Penicillin G Other (See Comments)    Unknown  . Pneumococcal Vaccines Other (See Comments)    Caused "pneumonia"  . Scopolamine     The patch, caused her to pass out / change in mental status  . Sinemet [Carbidopa W-Levodopa] Nausea And Vomiting  . Tape   . Latex Hives and Rash     Review of Systems: All systems reviewed and negative except where noted in HPI.    Physical Exam:     BP (!) 142/82   Pulse 89   Ht 5' (1.524 m)   Wt 147 lb 8 oz (66.9 kg)   BMI 28.81 kg/m  @WEIGHTLAST3 @ GENERAL:  Alert, oriented, cooperative, not in acute distress. PSYCH: :Pleasant, normal mood and affect. HEENT:  conjunctiva pink, mucous membranes moist, neck supple without masses. No jaundice. CARDIAC:  S1 S2 normal. No murmers. PULM: Normal respiratory effort, lungs CTA bilaterally, no wheezing. ABDOMEN: Inspection: No visible peristalsis, no abnormal pulsations, skin normal.  Palpation/percussion: Soft, nontender, nondistended, no rigidity, no abnormal dullness to percussion, no hepatosplenomegaly and no palpable abdominal masses.  Auscultation: Normal bowel sounds, no abdominal bruits. Rectal exam: Deferred SKIN:  turgor, no lesions seen. Musculoskeletal:  Normal muscle tone, normal strength. NEURO: Alert and oriented x 3, no focal neurologic deficits.  CBC Latest Ref Rng & Units 11/05/2017 08/06/2017 11/26/2015  WBC 3.4 - 10.8 x10E3/uL 4.8 4.6 5.0  Hemoglobin 11.1 - 15.9 g/dL 12.2 13.2 12.9  Hematocrit 34.0 - 46.6 % 35.2 37.4 37.6  Platelets  150 - 379 x10E3/uL 134(L) 134(L) 92(L)   CMP Latest Ref Rng & Units 08/06/2017 11/26/2015 10/30/2015  Glucose 65 - 99 mg/dL 104(H) 88 99  BUN 8 - 27 mg/dL 7(L) 11 15  Creatinine 0.57 - 1.00 mg/dL 0.60 0.57 0.60  Sodium 134 - 144 mmol/L 143 140 142  Potassium 3.5 - 5.2 mmol/L 4.2 3.8 3.7  Chloride 96 - 106 mmol/L 104 105 104  CO2 20 - 29 mmol/L  24 27 29   Calcium 8.7 - 10.3 mg/dL 10.0 9.0 9.3  Total Protein 6.0 - 8.5 g/dL 7.5 7.2 7.7  Total Bilirubin 0.0 - 1.2 mg/dL 0.9 1.1 1.5(H)  Alkaline Phos 39 - 117 IU/L 94 80 96  AST 0 - 40 IU/L 17 30 54(H)  ALT 0 - 32 IU/L 17 25 30       Demarrio Menges,MD 03/08/2018, 1:56 PM   CC [place], Berna Spare, NP

## 2018-03-08 NOTE — Telephone Encounter (Signed)
Will route to Dr. Agustin Cree to address holding the Plavix.  Rosaria Ferries, PA-C

## 2018-03-08 NOTE — Addendum Note (Signed)
Addended by: Karena Addison on: 03/08/2018 03:40 PM   Modules accepted: Orders

## 2018-03-08 NOTE — Telephone Encounter (Signed)
Ok, to hold Plavix for 5 days

## 2018-03-08 NOTE — Patient Instructions (Addendum)
If you are age 69 or older, your body mass index should be between 23-30. Your Body mass index is 28.81 kg/m. If this is out of the aforementioned range listed, please consider follow up with your Primary Care Provider.  If you are age 44 or younger, your body mass index should be between 19-25. Your Body mass index is 28.81 kg/m. If this is out of the aformentioned range listed, please consider follow up with your Primary Care Provider.   .You have been scheduled for an endoscopy. Please follow written instructions given to you at your visit today. If you use inhalers (even only as needed), please bring them with you on the day of your procedure. Your physician has requested that you go to www.startemmi.com and enter the access code given to you at your visit today. This web site gives a general overview about your procedure. However, you should still follow specific instructions given to you by our office regarding your preparation for the procedure.   You have been scheduled for an abdominal ultrasound at [facility] (1st floor [address] ) on 03/12/18 at 10am. Please arrive 15 minutes prior to your appointment for registration. Make certain not to have anything to eat or drink 6 hours prior to your appointment. Should you need to reschedule your appointment, please contact radiology at [phone]. This test typically takes about 30 minutes to perform.  You will be contacted by our office prior to your procedure for directions on holding your Plavix.  If you do not hear from our office 1 week prior to your scheduled procedure, please call [phone] to discuss.    Thank you,  Dr. Jackquline Denmark

## 2018-03-12 ENCOUNTER — Ambulatory Visit (HOSPITAL_BASED_OUTPATIENT_CLINIC_OR_DEPARTMENT_OTHER)
Admission: RE | Admit: 2018-03-12 | Discharge: 2018-03-12 | Disposition: A | Discharge status: Home / Self Care | Payer: Medicare HMO | Source: Ambulatory Visit | Attending: Gastroenterology | Admitting: Gastroenterology

## 2018-03-12 DIAGNOSIS — R161 Splenomegaly, not elsewhere classified: Secondary | ICD-10-CM

## 2018-03-12 DIAGNOSIS — R1011 Right upper quadrant pain: Secondary | ICD-10-CM

## 2018-03-13 ENCOUNTER — Ambulatory Visit (HOSPITAL_BASED_OUTPATIENT_CLINIC_OR_DEPARTMENT_OTHER)
Admission: RE | Admit: 2018-03-13 | Discharge: 2018-03-13 | Disposition: A | Discharge status: Home / Self Care | Payer: Medicare HMO | Source: Ambulatory Visit | Attending: Gastroenterology | Admitting: Gastroenterology

## 2018-03-13 DIAGNOSIS — K7469 Other cirrhosis of liver: Secondary | ICD-10-CM | POA: Insufficient documentation

## 2018-03-13 DIAGNOSIS — R161 Splenomegaly, not elsewhere classified: Secondary | ICD-10-CM | POA: Diagnosis not present

## 2018-03-13 DIAGNOSIS — K8020 Calculus of gallbladder without cholecystitis without obstruction: Secondary | ICD-10-CM | POA: Insufficient documentation

## 2018-03-13 DIAGNOSIS — R1011 Right upper quadrant pain: Secondary | ICD-10-CM | POA: Diagnosis present

## 2018-03-19 ENCOUNTER — Encounter: Payer: Self-pay | Admitting: Cardiology

## 2018-03-19 ENCOUNTER — Ambulatory Visit: Payer: Medicare HMO | Admitting: Cardiology

## 2018-03-19 VITALS — BP 160/68 | HR 102 | Ht 60.0 in | Wt 146.0 lb

## 2018-03-19 DIAGNOSIS — G894 Chronic pain syndrome: Secondary | ICD-10-CM

## 2018-03-19 DIAGNOSIS — I10 Essential (primary) hypertension: Secondary | ICD-10-CM | POA: Diagnosis not present

## 2018-03-19 DIAGNOSIS — D696 Thrombocytopenia, unspecified: Secondary | ICD-10-CM | POA: Diagnosis not present

## 2018-03-19 DIAGNOSIS — R072 Precordial pain: Secondary | ICD-10-CM | POA: Diagnosis not present

## 2018-03-19 NOTE — Patient Instructions (Signed)
Medication Instructions:  Your physician recommends that you continue on your current medications as directed. Please refer to the Current Medication list given to you today.  Labwork: None  Testing/Procedures: None  Follow-Up: Your physician wants you to follow-up in: 5 months. You will receive a reminder letter in the mail two months in advance. If you don't receive a letter, please call our office to schedule the follow-up appointment.  Any Other Special Instructions Will Be Listed Below (If Applicable).     If you need a refill on your cardiac medications before your next appointment, please call your pharmacy.   

## 2018-03-19 NOTE — Progress Notes (Signed)
Cardiology Office Note:    Date:  03/19/2018   ID:  Amy Moon, DOB [birth year], MRN [id number]  PCP:  [name], NP  Cardiologist:  Jenne Campus, MD    Referring MD: [name], NP   Chief Complaint  Patient presents with  . 1 month follow up  Doing well  History of Present Illness:    Amy Moon is a 67 y.o. female with atypical chest and the risk factors for coronary artery disease.  So far evaluation for coronary artery disease has been negative.  Stress test was good quality and showed no evidence of ischemia she still taking ranolazine because she thinks it helps her.  She also use nitroglycerin sporadically when she gets chest pain.  Otherwise she seems to be doing well she was recently seen by gastroenterology team and she is scheduled to have gastroscopy with esophageal stretch for chronic problem.  She tried to walk and be active and doing quite well with it.  Past Medical History:  Diagnosis Date  . Angina pectoris ([place])   . Chronic kidney disease   . Drug-induced low platelet count   . Duodenal ulcer   . Granulomatous disease ([place])   . Hepatic disease   . Hypertension   . Low vitamin D level   . Neuropathy   . Osteopenia   . Parkinson's disease ([place])   . Portal hypertension ([place])   . Splenomegaly a    Past Surgical History:  Procedure Laterality Date  . ABDOMINAL HYSTERECTOMY    . APPENDECTOMY    . BACK SURGERY    . ESOPHAGOGASTRODUODENOSCOPY  05/20/2008   Mild to moderate esophagitis. Otherwise normal EGD.   Marland Kitchen GASTROSTOMY     peptic ulcer    Current Medications: Current Meds  Medication Sig  . clopidogrel (PLAVIX) 75 MG tablet TAKE 1 TABLET (75 MG TOTAL) BY MOUTH DAILY.  . diphenhydrAMINE (BENADRYL) 50 MG tablet Take 50 mg by mouth at bedtime as needed for itching.  . gabapentin (NEURONTIN) 100 MG capsule   . nitroGLYCERIN (NITROSTAT) 0.4 MG SL tablet Place 0.4 mg under the tongue as needed for chest pain.  Marland Kitchen omeprazole  (PRILOSEC) 20 MG capsule Take 1 capsule (20 mg total) by mouth daily.  . ranolazine (RANEXA) 500 MG 12 hr tablet TAKE 1 TABLET BY MOUTH 2 TIMES DAILY.  . traMADol (ULTRAM) 50 MG tablet TAKE 1 TABLET BY MOUTH EVERY 6 HOURS AS NEEDED FOR MODERATE PAIN  . Vitamin D, Ergocalciferol, (DRISDOL) 50000 units CAPS capsule Take 1 capsule (50,000 Units total) by mouth every 7 (seven) days.     Allergies:   Nsaids; Inderal [propranolol]; Indomethacin; Penicillin g; Pneumococcal vaccines; Scopolamine; Sinemet [carbidopa w-levodopa]; Tape; and Latex   Social History   Socioeconomic History  . Marital status: Married    Spouse name: Not on file  . Number of children: 3  . Years of education: Not on file  . Highest education level: Not on file  Occupational History  . Not on file  Social Needs  . Financial resource strain: Not on file  . Food insecurity:    Worry: Not on file    Inability: Not on file  . Transportation needs:    Medical: Not on file    Non-medical: Not on file  Tobacco Use  . Smoking status: Former Smoker    Packs/day: 1.00    Years: 22.00    Pack years: 22.00    Types: Cigarettes  Last attempt to quit: 06/26/1986    Years since quitting: 31.7  . Smokeless tobacco: Never Used  Substance and Sexual Activity  . Alcohol use: No  . Drug use: No  . Sexual activity: Not Currently    Birth control/protection: None  Lifestyle  . Physical activity:    Days per week: Not on file    Minutes per session: Not on file  . Stress: Not on file  Relationships  . Social connections:    Talks on phone: Not on file    Gets together: Not on file    Attends religious service: Not on file    Active member of club or organization: Not on file    Attends meetings of clubs or organizations: Not on file    Relationship status: Not on file  Other Topics Concern  . Not on file  Social History Narrative  . Not on file     Family History: The patient's family history includes ALS in her  sister; Alzheimer's disease in her father and mother; Arthritis in her mother and sister; Cancer in her father, maternal aunt, maternal uncle, and mother; Colon cancer in her maternal uncle; Heart attack in her paternal aunt, paternal grandfather, and paternal uncle; Huntington's disease in her daughter; Hyperlipidemia in her mother; Parkinson's disease in her father; Stroke in her paternal aunt and paternal grandmother. ROS:   Please see the history of present illness.    All 14 point review of systems negative except as described per history of present illness  EKGs/Labs/Other Studies Reviewed:      Recent Labs: 08/06/2017: ALT 17; BUN 7; Creatinine, Ser 0.60; Potassium 4.2; Sodium 143; TSH 0.375 11/05/2017: Hemoglobin 12.2; Platelets 134  Recent Lipid Panel    Component Value Date/Time   CHOL 164 08/06/2017 0907   TRIG 72 08/06/2017 0907   HDL 64 08/06/2017 0907   CHOLHDL 2.6 08/06/2017 0907   LDLCALC 86 08/06/2017 0907    Physical Exam:    VS:  BP (!) 160/68   Pulse (!) 102   Ht 5' (1.524 m)   Wt 146 lb (66.2 kg)   SpO2 98%   BMI 28.51 kg/m     Wt Readings from Last 3 Encounters:  03/19/18 146 lb (66.2 kg)  03/08/18 147 lb 8 oz (66.9 kg)  11/26/17 144 lb (65.3 kg)     GEN:  Well nourished, well developed in no acute distress HEENT: Normal NECK: No JVD; No carotid bruits LYMPHATICS: No lymphadenopathy CARDIAC: RRR, no murmurs, no rubs, no gallops RESPIRATORY:  Clear to auscultation without rales, wheezing or rhonchi  ABDOMEN: Soft, non-tender, non-distended MUSCULOSKELETAL:  No edema; No deformity  SKIN: Warm and dry LOWER EXTREMITIES: no swelling NEUROLOGIC:  Alert and oriented x 3 PSYCHIATRIC:  Normal affect   ASSESSMENT:    1. Precordial pain   2. Essential hypertension   3. Thrombocytopenia ([place])   4. Chronic pain syndrome    PLAN:    In order of problems listed above:  1. Precordial pain.  Stress test negative still on medical therapy which I will  continue.  We will be able to hold Plavix for 5 days but then she need to restart 2. Essential hypertension blood pressure well controlled continue present management 3. Thrombocytopenia apparently stable 4. Chronic pain syndrome noted.   Medication Adjustments/Labs and Tests Ordered: Current medicines are reviewed at length with the patient today.  Concerns regarding medicines are outlined above.  No orders of the defined types were  placed in this encounter.  Medication changes: No orders of the defined types were placed in this encounter.   Signed, Park Liter, MD, [facility] 03/19/2018 3:14 PM    [place]

## 2018-03-21 ENCOUNTER — Ambulatory Visit (INDEPENDENT_AMBULATORY_CARE_PROVIDER_SITE_OTHER): Payer: Medicare HMO | Admitting: Specialist

## 2018-03-21 ENCOUNTER — Encounter (INDEPENDENT_AMBULATORY_CARE_PROVIDER_SITE_OTHER): Payer: Self-pay | Admitting: Specialist

## 2018-03-21 VITALS — BP 143/78 | HR 92 | Ht 60.0 in | Wt 146.0 lb

## 2018-03-21 DIAGNOSIS — M47812 Spondylosis without myelopathy or radiculopathy, cervical region: Secondary | ICD-10-CM

## 2018-03-21 MED ORDER — CLONAZEPAM 0.5 MG PO TABS: 0.2500 mg | ORAL_TABLET | Freq: Two times a day (BID) | ORAL | 0 refills | Status: DC | PRN

## 2018-03-21 NOTE — Progress Notes (Signed)
Office Visit Note   Patient: Amy Moon           Date of Birth: [birth year]           MRN: [id number] Visit Date: 03/21/2018              Requested by: [name], NP [place],  [id number] PCP: [name], NP   Assessment & Plan: Visit Diagnoses:  1. Spondylosis without myelopathy or radiculopathy, cervical region     Plan:Avoid overhead lifting and overhead use of the arms. Do not lift greater than 5 lbs. Adjust head rest in vehicle to prevent hyperextension if rear ended. Take extra precautions to avoid falling.  Follow-Up Instructions: Return in about 3 weeks (around 04/11/2018).   Orders:  No orders of the defined types were placed in this encounter.  No orders of the defined types were placed in this encounter.     Procedures: No procedures performed   Clinical Data: No additional findings.   Subjective: Chief Complaint  Patient presents with  . Neck - Follow-up    She had Left C2-3, C3-4 Facet Block on 01/08/2018    67 year old female with history of neck pain and radiation into the right posterior occiput and scalp and posterior auricular. She had right C2-3 and C3-4 injection of facets with nearly 75 % relief of pain for about 5-6 hours.    Review of Systems  Constitutional: Negative.   HENT: Negative.   Eyes: Negative.   Respiratory: Negative.   Cardiovascular: Negative.   Gastrointestinal: Negative.   Endocrine: Negative.   Genitourinary: Negative.   Musculoskeletal: Negative.   Skin: Negative.   Allergic/Immunologic: Negative.   Neurological: Negative.   Hematological: Negative.   Psychiatric/Behavioral: Negative.      Objective: Vital Signs: BP (!) 143/78 (BP Location: Left Arm, Patient Position: Sitting)   Pulse 92   Ht 5' (1.524 m)   Wt 146 lb (66.2 kg)   BMI 28.51 kg/m   Physical Exam  Constitutional: She is oriented to person, place, and time. She appears well-developed and well-nourished.    HENT:  Head: Normocephalic and atraumatic.  Eyes: Pupils are equal, round, and reactive to light. EOM are normal.  Neck: Normal range of motion. Neck supple.  Pulmonary/Chest: Effort normal and breath sounds normal.  Abdominal: Soft. Bowel sounds are normal.  Musculoskeletal: Normal range of motion.  Neurological: She is alert and oriented to person, place, and time.  Skin: Skin is warm and dry.  Psychiatric: She has a normal mood and affect. Her behavior is normal. Judgment and thought content normal.    Ortho Exam  Specialty Comments:  No specialty comments available.  Imaging: No results found.   PMFS History: Patient Active Problem List   Diagnosis Date Noted  . Vitamin D deficiency 11/05/2017  . Encounter for hepatitis C virus screening test for high risk patient 11/05/2017  . Dysphagia 09/03/2017  . Thrombocytopenia ([place]) 09/03/2017  . Healthcare maintenance 07/30/2017  . Chronic pain syndrome 07/30/2017  . Chest pain 07/30/2017  . Parkinson's disease ([place]) 07/30/2017  . Hypertension 07/30/2017  . Severe obesity (BMI >= 40) ([place]) 05/15/2016  . Severe episode of recurrent major depressive disorder, without psychotic features ([place]) 05/15/2016  . Tremor of both hands 05/15/2016  . Irritant contact dermatitis 08/25/2015   Past Medical History:  Diagnosis Date  . Angina pectoris ([place])   . Chronic kidney disease   . Drug-induced low platelet  count   . Duodenal ulcer   . Granulomatous disease ([place])   . Hepatic disease   . Hypertension   . Low vitamin D level   . Neuropathy   . Osteopenia   . Parkinson's disease ([place])   . Portal hypertension ([place])   . Splenomegaly a    Family History  Problem Relation Age of Onset  . Cancer Mother        ovarian  . Hyperlipidemia Mother   . Alzheimer's disease Mother   . Arthritis Mother        rheumatoid  . Cancer Father        lung  . Parkinson's disease Father   . Alzheimer's disease Father   . ALS Sister   .  Cancer Maternal Aunt        pancreatic, lung, liver,breast  . Cancer Maternal Uncle        pancreatic, liver, brain  . Heart attack Paternal Aunt   . Stroke Paternal Aunt   . Heart attack Paternal Uncle   . Stroke Paternal Grandmother   . Heart attack Paternal Grandfather   . Arthritis Sister        rheumatoid  . Huntington's disease Daughter        presumed inherited from father per patient  . Colon cancer Maternal Uncle     Past Surgical History:  Procedure Laterality Date  . ABDOMINAL HYSTERECTOMY    . APPENDECTOMY    . BACK SURGERY    . ESOPHAGOGASTRODUODENOSCOPY  05/20/2008   Mild to moderate esophagitis. Otherwise normal EGD.   Marland Kitchen GASTROSTOMY     peptic ulcer   Social History   Occupational History  . Not on file  Tobacco Use  . Smoking status: Former Smoker    Packs/day: 1.00    Years: 22.00    Pack years: 22.00    Types: Cigarettes    Last attempt to quit: 06/26/1986    Years since quitting: 31.7  . Smokeless tobacco: Never Used  Substance and Sexual Activity  . Alcohol use: No  . Drug use: No  . Sexual activity: Not Currently    Birth control/protection: None

## 2018-03-21 NOTE — Patient Instructions (Signed)
Avoid overhead lifting and overhead use of the arms. Do not lift greater than 5 lbs. Adjust head rest in vehicle to prevent hyperextension if rear ended. Take extra precautions to avoid falling.  

## 2018-03-22 ENCOUNTER — Encounter: Payer: Self-pay | Admitting: Gastroenterology

## 2018-03-27 ENCOUNTER — Telehealth: Payer: Self-pay | Admitting: Gastroenterology

## 2018-03-27 NOTE — Telephone Encounter (Signed)
Attempted to call pt, received message that "voicemail has not been set up yet." Will try again later.

## 2018-03-27 NOTE — Telephone Encounter (Signed)
Pt has an egd scheduled with Dr. Lyndel Safe on 04/05/18 and is very nervous about. She would like to speak with a nurse. Pls call her.

## 2018-03-28 NOTE — Telephone Encounter (Signed)
Discussed with pt that she will be sedated and will not be aware of the scope going down her throat. Pt seemed to be relieved and her questions were answered.

## 2018-04-05 ENCOUNTER — Ambulatory Visit (AMBULATORY_SURGERY_CENTER): Payer: Medicare HMO | Admitting: Gastroenterology

## 2018-04-05 ENCOUNTER — Encounter: Payer: Self-pay | Admitting: Gastroenterology

## 2018-04-05 VITALS — BP 123/58 | HR 67 | Temp 99.3°F | Resp 10 | Ht 60.0 in | Wt 146.0 lb

## 2018-04-05 DIAGNOSIS — K210 Gastro-esophageal reflux disease with esophagitis: Secondary | ICD-10-CM | POA: Diagnosis present

## 2018-04-05 DIAGNOSIS — K2970 Gastritis, unspecified, without bleeding: Secondary | ICD-10-CM

## 2018-04-05 DIAGNOSIS — K222 Esophageal obstruction: Secondary | ICD-10-CM | POA: Diagnosis not present

## 2018-04-05 DIAGNOSIS — R1310 Dysphagia, unspecified: Secondary | ICD-10-CM

## 2018-04-05 DIAGNOSIS — R1011 Right upper quadrant pain: Secondary | ICD-10-CM

## 2018-04-05 MED ORDER — SODIUM CHLORIDE 0.9 % IV SOLN: 500.0000 mL | Freq: Once | INTRAVENOUS | Status: DC

## 2018-04-05 MED ORDER — OMEPRAZOLE 20 MG PO CPDR: 20.0000 mg | DELAYED_RELEASE_CAPSULE | Freq: Two times a day (BID) | ORAL | 3 refills | Status: DC

## 2018-04-05 NOTE — Op Note (Addendum)
[facility] Patient Name: Amy Moon Procedure Date: 04/05/2018 2:57 PM MRN: [id number] Endoscopist: Jackquline Denmark , MD Age: 67 Referring MD:  Date of Birth: 1950-07-29 Gender: Female Account #: [id number] Procedure:                Upper GI endoscopy Indications:              #1.?Esophageal Dysphagia (with component of                            oropharyngeal dysphagia, ?? Has parkinson's). Has                            PEJ placed in 2012?(removed 2017). Ba swallow                            showing flash laryngeal penetration, distal                            esophageal mucosal ring, small hiatal hernia.                           #2.?GERD?with?history of erosive esophagitis?with                            H/O Schatzki ring. Pt?had food impaction in 2003                            status post endoscopic disimpaction followed by EGD                            with esophageal dilatation. Medicines:                Monitored Anesthesia Care Procedure:                Pre-Anesthesia Assessment:                           - Prior to the procedure, a History and Physical                            was performed, and patient medications and                            allergies were reviewed. The patient's tolerance of                            previous anesthesia was also reviewed. The risks                            and benefits of the procedure and the sedation                            options and risks were discussed with the patient.  All questions were answered, and informed consent                            was obtained. Prior Anticoagulants: The patient has                            taken Plavix (clopidogrel), last dose was 7 days                            prior to procedure. ASA Grade Assessment: III - A                            patient with severe systemic disease. After                            reviewing the risks and benefits, the  patient was                            deemed in satisfactory condition to undergo the                            procedure.                           After obtaining informed consent, the endoscope was                            passed under direct vision. Throughout the                            procedure, the patient's blood pressure, pulse, and                            oxygen saturations were monitored continuously. The                            Endoscope was introduced through the mouth, and                            advanced to the second part of duodenum. The upper                            GI endoscopy was accomplished without difficulty.                            The patient tolerated the procedure well. Scope In: Scope Out: Findings:                 One benign-appearing, Schatzki's ring was found 35                            cm from the incisors. This stenosis measured 1.4 cm                            (  inner diameter). The stenosis was traversed. The                            scope was withdrawn. Dilation was performed with a                            Maloney dilator with no resistance with 48 French,                            mild resistance at 52 Fr (50Fr was not available at                            the time). Few erosions/erythema in the distal                            esophagus. Biopsies were taken with a cold forceps                            for histology. Estimated blood loss was minimal.                           A 2 cm hiatal hernia was present.                           The entire examined stomach was normal. Biopsies                            were taken with a cold forceps for histology.                            Estimated blood loss: none. Previous site of PEG                            tube was visualized.                           The examined duodenum was normal. Complications:            No immediate complications. Estimated Blood Loss:      Estimated blood loss: none. Impression:               -Schatzki's ring with grade A esophagitis s/p                            biopsies and esophageal dilatation.                           -2 cm hiatal hernia. Recommendation:           - Patient has a contact number available for                            emergencies. The signs and symptoms of potential  delayed complications were discussed with the                            patient. Return to normal activities tomorrow.                            Written discharge instructions were provided to the                            patient.                           - Post dilatation diet.                           -Increase Omeprazole to 20 mg p.o. twice daily.                           - Await pathology results.                           - Return to GI clinic in 12 weeks.                           - Resume plavix in 3 days. Jackquline Denmark, MD 04/05/2018 3:29:46 PM This report has been signed electronically.

## 2018-04-05 NOTE — Progress Notes (Signed)
Called to room to assist during endoscopic procedure.  Patient ID and intended procedure confirmed with present staff. Received instructions for my participation in the procedure from the performing physician.  

## 2018-04-05 NOTE — Progress Notes (Signed)
To PACU, vss patent aw report to rn 

## 2018-04-05 NOTE — Patient Instructions (Signed)
Impression/Recommendations:  Hiatal hernia handout given to patient. Dilation diet handout given to patient.  Increase Omeprazole to 20 mg. By mouth twice daily.  Resume Plavix in 3 days.  Return to GI clinic in 12 weeks.  YOU HAD AN ENDOSCOPIC PROCEDURE TODAY AT [facility]:   Refer to the procedure report that was given to you for any specific questions about what was found during the examination.  If the procedure report does not answer your questions, please call your gastroenterologist to clarify.  If you requested that your care partner not be given the details of your procedure findings, then the procedure report has been included in a sealed envelope for you to review at your convenience later.  YOU SHOULD EXPECT: Some feelings of bloating in the abdomen. Passage of more gas than usual.  Walking can help get rid of the air that was put into your GI tract during the procedure and reduce the bloating. If you had a lower endoscopy (such as a colonoscopy or flexible sigmoidoscopy) you may notice spotting of blood in your stool or on the toilet paper. If you underwent a bowel prep for your procedure, you may not have a normal bowel movement for a few days.  Please Note:  You might notice some irritation and congestion in your nose or some drainage.  This is from the oxygen used during your procedure.  There is no need for concern and it should clear up in a day or so.  SYMPTOMS TO REPORT IMMEDIATELY:  Following upper endoscopy (EGD)  Vomiting of blood or coffee ground material  New chest pain or pain under the shoulder blades  Painful or persistently difficult swallowing  New shortness of breath  Fever of 100F or higher  Black, tarry-looking stools  For urgent or emergent issues, a gastroenterologist can be reached at any hour by calling [phone].   DIET:  We do recommend a small meal at first, but then you may proceed to your regular diet.  Drink plenty of  fluids but you should avoid alcoholic beverages for 24 hours.  ACTIVITY:  You should plan to take it easy for the rest of today and you should NOT DRIVE or use heavy machinery until tomorrow (because of the sedation medicines used during the test).    FOLLOW UP: Our staff will call the number listed on your records the next business day following your procedure to check on you and address any questions or concerns that you may have regarding the information given to you following your procedure. If we do not reach you, we will leave a message.  However, if you are feeling well and you are not experiencing any problems, there is no need to return our call.  We will assume that you have returned to your regular daily activities without incident.  If any biopsies were taken you will be contacted by phone or by letter within the next 1-3 weeks.  Please call us at [phone] if you have not heard about the biopsies in 3 weeks.    SIGNATURES/CONFIDENTIALITY: You and/or your care partner have signed paperwork which will be entered into your electronic medical record.  These signatures attest to the fact that that the information above on your After Visit Summary has been reviewed and is understood.  Full responsibility of the confidentiality of this discharge information lies with you and/or your care-partner.

## 2018-04-08 ENCOUNTER — Telehealth: Payer: Self-pay

## 2018-04-08 NOTE — Telephone Encounter (Signed)
  Follow up Call-  Call back number 04/05/2018  Post procedure Call Back phone  # [phone]  Permission to leave phone message Yes  Some recent data might be hidden     Patient questions:  Do you have a fever, pain , or abdominal swelling? No. Pain Score  0 *  Have you tolerated food without any problems? Yes.    Have you been able to return to your normal activities? Yes.    Do you have any questions about your discharge instructions: Diet   No. Medications  No. Follow up visit  No.  Do you have questions or concerns about your Care? No.  Actions: * If pain score is 4 or above: No action needed, pain <4.

## 2018-04-10 ENCOUNTER — Telehealth: Payer: Self-pay

## 2018-04-10 ENCOUNTER — Other Ambulatory Visit: Payer: Self-pay | Admitting: Cardiology

## 2018-04-10 ENCOUNTER — Other Ambulatory Visit: Payer: Self-pay | Admitting: Adult Health

## 2018-04-10 DIAGNOSIS — E559 Vitamin D deficiency, unspecified: Secondary | ICD-10-CM

## 2018-04-10 NOTE — Telephone Encounter (Signed)
Please call pt to schedule lab appt for vitamin d level.  Charyl Bigger, CMA

## 2018-04-11 ENCOUNTER — Telehealth (INDEPENDENT_AMBULATORY_CARE_PROVIDER_SITE_OTHER): Payer: Self-pay | Admitting: Physical Medicine and Rehabilitation

## 2018-04-11 ENCOUNTER — Ambulatory Visit (INDEPENDENT_AMBULATORY_CARE_PROVIDER_SITE_OTHER): Payer: Medicare HMO | Admitting: Physical Medicine and Rehabilitation

## 2018-04-11 ENCOUNTER — Encounter: Payer: Self-pay | Admitting: Gastroenterology

## 2018-04-11 ENCOUNTER — Encounter (INDEPENDENT_AMBULATORY_CARE_PROVIDER_SITE_OTHER): Payer: Self-pay | Admitting: Physical Medicine and Rehabilitation

## 2018-04-11 VITALS — BP 142/74 | HR 79 | Ht 60.0 in | Wt 145.0 lb

## 2018-04-11 DIAGNOSIS — M542 Cervicalgia: Secondary | ICD-10-CM | POA: Diagnosis not present

## 2018-04-11 DIAGNOSIS — M47812 Spondylosis without myelopathy or radiculopathy, cervical region: Secondary | ICD-10-CM

## 2018-04-11 NOTE — Progress Notes (Signed)
 .  Numeric Pain Rating Scale and Functional Assessment Average Pain 7 Pain Right Now 8 My pain is constant and sharp Pain is worse with: walking and some activites Pain improves with: rest   In the last MONTH (on 0-10 scale) has pain interfered with the following?  1. General activity like being  able to carry out your everyday physical activities such as walking, climbing stairs, carrying groceries, or moving a chair?  Rating(5)  2. Relation with others like being able to carry out your usual social activities and roles such as  activities at home, at work and in your community. Rating(4)  3. Enjoyment of life such that you have  been bothered by emotional problems such as feeling anxious, depressed or irritable?  Rating(4)

## 2018-04-15 ENCOUNTER — Telehealth: Payer: Self-pay | Admitting: Adult Health

## 2018-04-15 ENCOUNTER — Telehealth (INDEPENDENT_AMBULATORY_CARE_PROVIDER_SITE_OTHER): Payer: Self-pay | Admitting: *Deleted

## 2018-04-15 NOTE — Telephone Encounter (Signed)
Patient is sch for lab visit on 10/23 for Vit D only and then has a Medicare Wellness Exam in Nov. Patient wants me to send this message to make sure there will not be additional labs needed for wellness visit, if so she would like to do this on 10/23 with the Vit D. Patient is requesting call back either way (if no labs are needed or if there are some added) just to be in the loop. Please advise

## 2018-04-16 ENCOUNTER — Encounter (INDEPENDENT_AMBULATORY_CARE_PROVIDER_SITE_OTHER): Payer: Self-pay | Admitting: Physical Medicine and Rehabilitation

## 2018-04-16 ENCOUNTER — Other Ambulatory Visit: Payer: Self-pay

## 2018-04-16 DIAGNOSIS — D696 Thrombocytopenia, unspecified: Secondary | ICD-10-CM

## 2018-04-16 NOTE — Telephone Encounter (Signed)
Submitted PA to [employer], case is pending.   Auth No / Request ID [id number] Status Received

## 2018-04-16 NOTE — Telephone Encounter (Signed)
Done.  Pt informed.  Charyl Bigger, CMA

## 2018-04-16 NOTE — Telephone Encounter (Signed)
Just vit d Thanks! Amy Moon

## 2018-04-16 NOTE — Telephone Encounter (Signed)
done

## 2018-04-16 NOTE — Progress Notes (Signed)
Amy Moon - 67 y.o. female MRN [id number]  Date of birth: 02-Apr-1951  Office Visit Note: Visit Date: 04/11/2018 PCP: [name], NP Referred by: [name], NP  Subjective: Chief Complaint  Patient presents with  . Neck - Pain  . Left Leg - Pain  . Right Leg - Pain  . Right Shoulder - Pain  . Left Shoulder - Pain   HPI: Amy Moon is a 67 y.o. female who comes in today Comes in today at the request of Dr. Basil Dess for evaluation management of normally axial neck pain which is chronic and recalcitrant.  She reports years of neck pain but with several months of severe worsening neck pain worse with extension and rotation left and right pretty equally.  She reports pain into the shoulders bilaterally.  She denies any radicular complaints.  She has tried and failed conservative care including medication management as well as activity modification and directed therapy through Dr. Louanne Skye.  She has had MRI of the cervical spine which is reviewed below.  MRI is notable for bilateral upper facet joint edema with mild central canal narrowing but no overt nerve compression.  There is some foraminal narrowing.  She has had no history of prior cervical surgery.  She reports again any movement of the cervical spine increases her neck pain but particularly rotation.  Her average pain is 7 out of 10.  She reports this is fairly constant and sharp and again is worse with movement and activities and walking.  She gets some relief with rest and medications have been not really helpful.  She has multiple medical issues as well and complicated by Parkinson's disease.  On 7/16 we completed medial branch blocks of the bilateral C2-3 and C3-4 facet joints with almost 75% relief of her pain as documented by Dr. Louanne Skye in follow-up.  She reports this is really only thing is given her much relief.  She is unable to take a lot of medications and she is tried other medications in the past.  As an aside she  also reports low back and right and left leg pain chronically.  She does have chronic pain syndrome.  Dr. Louanne Skye is following her for her lumbar spine and really did not address this issue today.  Review of Systems  Constitutional: Negative for chills, fever, malaise/fatigue and weight loss.  HENT: Negative for hearing loss and sinus pain.   Eyes: Negative for blurred vision, double vision and photophobia.  Respiratory: Negative for cough and shortness of breath.   Cardiovascular: Negative for chest pain, palpitations and leg swelling.  Gastrointestinal: Negative for abdominal pain, nausea and vomiting.  Genitourinary: Negative for flank pain.  Musculoskeletal: Positive for back pain, joint pain and neck pain. Negative for myalgias.  Skin: Negative for itching and rash.  Neurological: Negative for tremors, focal weakness and weakness.  Endo/Heme/Allergies: Negative.   Psychiatric/Behavioral: Negative for depression.  All other systems reviewed and are negative.  Otherwise per HPI.  Assessment & Plan: Visit Diagnoses:  1. Cervical spondylosis without myelopathy   2. Cervicalgia     Plan: Findings:  Chronic recalcitrant neck and cervical spine pain with a complicated history of chronic pain syndrome and Parkinson's disease.  Cervical MRI shows multilevel spondylosis particularly in the upper cervical facet joints and this is concordant with her history and exam.  Exam is significant for pain with facet joint loading and extension rotation of the cervical spine.  Diagnostically the only thing  that is helped her has been medial branch blocks of the C2-3 and C3-4 facet joints bilaterally.  She is failed conservative care otherwise as noted above.  Dr. Louanne Skye felt like there is no surgical answer to her symptoms.  Discussed with the patient today that the best plan is to perform a second facet joint block and a double block paradigm using fluoroscopic guidance.  If she gets good relief which will  document on a pain diary then we will try to get authorization for cervical radiofrequency ablation.  We went over the concept of ablation and the risk and benefits and she does want to proceed.    Meds & Orders: No orders of the defined types were placed in this encounter.  No orders of the defined types were placed in this encounter.   Follow-up: Return for Bilateral C2-3 and C3-4 medial branch block.   Procedures: No procedures performed  No notes on file   Clinical History: MRI CERVICAL SPINE WITHOUT CONTRAST  TECHNIQUE: Multiplanar, multisequence MR imaging of the cervical spine was performed. No intravenous contrast was administered.  COMPARISON:  Cervical spine radiographs 10/03/2017.  FINDINGS: Alignment: Improved cervical lordosis compared to the radiographs last month. Mild straightening of lordosis. No spondylolisthesis.  Vertebrae: Heterogeneous endplate marrow signal appears to be degenerative in nature throughout much of the cervical spine. There are areas of mild endplate marrow edema which appears degenerative (posteriorly at C3-C4). Furthermore, there is confluent marrow edema in the left C2-C3 and right C3-C4 facets (series 4, image 1 on the right). These facets are chronically degenerated.  No other acute osseous abnormality identified.  Cord: Spinal cord signal is within normal limits at all visualized levels.  Posterior Fossa, vertebral arteries, paraspinal tissues: Negative visible brain parenchyma. Cervicomedullary junction is within normal limits. Preserved major vascular flow voids in the neck. Several small T2 hyperintense thyroid nodules measure up to 11 mm and do not meet size criteria for ultrasound follow-up. Negative visible lung apices.  Disc levels:  C2-C3: Broad-based mild disc bulging. Moderate facet hypertrophy greater on the left, which corresponds to the site of facet marrow edema. Mild to moderate ligament flavum  hypertrophy. Mild spinal stenosis. Mild cord mass effect. Mild left C3 foraminal stenosis.  C3-C4: Disc space loss with circumferential disc osteophyte complex. Broad-based posterior component. Moderate facet and ligament flavum hypertrophy greater on the right, where there is associated degenerative marrow edema. Spinal stenosis with mild cord mass effect. Moderate to severe bilateral C4 foraminal stenosis greater on the right.  C4-C5: Circumferential disc bulge and endplate spurring with broad-based posterior and biforaminal involvement. Mild facet and ligament flavum hypertrophy. Mild spinal stenosis with no cord mass effect. Moderate bilateral C5 foraminal stenosis.  C5-C6: Circumferential disc bulge with mild endplate spurring. Broad-based left foraminal involvement. Mild facet hypertrophy greater on the left. No spinal stenosis. Mild to moderate left and mild right C6 foraminal stenosis.  C6-C7: Circumferential disc bulge with broad-based left foraminal component (series 2, image 10). Endplate spurring. Mild facet and ligament flavum hypertrophy. No spinal stenosis. Moderate to severe left C7 neural foraminal stenosis.  C7-T1:  Mild facet hypertrophy.  No stenosis.  Thoracic findings are reported separately today.  IMPRESSION: 1. Marrow edema suggesting acute exacerbation of chronic facet joint arthritis at the left C2-C3 and right C3-C4 levels. There is some associated posterior and rightward degenerative endplate marrow edema at C3-C4. 2. Multifactorial spinal stenosis with up to mild spinal cord mass effect C2-C3 through C4-C5. No cord signal abnormality.  3. Moderate or severe neural foraminal stenosis at the bilateral C4, left C7, and to a lesser extent bilateral C5 nerve levels.   Electronically Signed   By: Genevie Ann M.D.   On: 10/24/2017 09:24   She reports that she quit smoking about 31 years ago. Her smoking use included cigarettes. She has a 22.00  pack-year smoking history. She has never used smokeless tobacco.  Recent Labs    08/06/17 0907 10/03/17 1027  HGBA1C 4.6*  --   LABURIC  --  3.8    Objective:  VS:  HT:5' (152.4 cm)   WT:145 lb (65.8 kg)  BMI:28.32    BP:(!) 142/74  HR:79bpm  TEMP: ( )  RESP:100 % Physical Exam  Constitutional: She is oriented to person, place, and time. She appears well-developed and well-nourished. No distress.  HENT:  Head: Normocephalic and atraumatic.  Nose: Nose normal.  Mouth/Throat: Oropharynx is clear and moist.  Eyes: Pupils are equal, round, and reactive to light. Conjunctivae are normal.  Neck: Neck supple. No JVD present. No tracheal deviation present.  Cardiovascular: Regular rhythm and intact distal pulses.  Pulmonary/Chest: Effort normal. No respiratory distress.  Abdominal: She exhibits no distension. There is no guarding.  Musculoskeletal:  Patient sits with forward flexed cervical spine.  She has concordant pain with extension rotation and facet joint loading both left and right.  No active trigger points in the upper cervical spine but she does have active trigger points in the levator scapula bilaterally.  Some triggering in the rhomboids as well.  She has mild shoulder impingement but this does not really reproduce the pain that she has.  She has good upper extremity strength bilaterally without any cytocide deficits.  She has a negative Hoffman's test bilaterally.  Neurological: She is alert and oriented to person, place, and time. She exhibits normal muscle tone. Coordination normal.  Skin: Skin is warm. No rash noted. No erythema.  Psychiatric: She has a normal mood and affect. Her behavior is normal.  Nursing note and vitals reviewed.   Ortho Exam Imaging: No results found.  Past Medical/Family/Surgical/Social History: Medications & Allergies reviewed per EMR, new medications updated. Patient Active Problem List   Diagnosis Date Noted  . Vitamin D deficiency  11/05/2017  . Encounter for hepatitis C virus screening test for high risk patient 11/05/2017  . Dysphagia 09/03/2017  . Thrombocytopenia ([place]) 09/03/2017  . Healthcare maintenance 07/30/2017  . Chronic pain syndrome 07/30/2017  . Chest pain 07/30/2017  . Parkinson's disease ([place]) 07/30/2017  . Hypertension 07/30/2017  . Severe obesity (BMI >= 40) ([place]) 05/15/2016  . Severe episode of recurrent major depressive disorder, without psychotic features ([place]) 05/15/2016  . Tremor of both hands 05/15/2016  . Irritant contact dermatitis 08/25/2015   Past Medical History:  Diagnosis Date  . Anemia   . Angina pectoris ([place])   . Arthritis   . Cancer ([facility])    squamous cell carcinoma, nose  . Cataract   . Chronic kidney disease   . Clotting disorder ([place])   . Drug-induced low platelet count   . Duodenal ulcer   . GERD (gastroesophageal reflux disease)   . Granulomatous disease ([place])   . Hepatic disease   . Hypertension   . Low vitamin D level   . Neuropathy   . Osteopenia   . Osteoporosis   . Parkinson's disease ([place])   . Portal hypertension ([place])   . Splenomegaly a   Family History  Problem Relation Age of Onset  .  Cancer Mother        ovarian  . Hyperlipidemia Mother   . Alzheimer's disease Mother   . Arthritis Mother        rheumatoid  . Cancer Father        lung  . Parkinson's disease Father   . Alzheimer's disease Father   . ALS Sister   . Cancer Maternal Aunt        pancreatic, lung, liver,breast  . Cancer Maternal Uncle        pancreatic, liver, brain  . Heart attack Paternal Aunt   . Stroke Paternal Aunt   . Heart attack Paternal Uncle   . Stroke Paternal Grandmother   . Heart attack Paternal Grandfather   . Arthritis Sister        rheumatoid  . Huntington's disease Daughter        presumed inherited from father per patient  . Colon cancer Maternal Uncle    Past Surgical History:  Procedure Laterality Date  . ABDOMINAL HYSTERECTOMY    . APPENDECTOMY      . BACK SURGERY    . ESOPHAGOGASTRODUODENOSCOPY  05/20/2008   Mild to moderate esophagitis. Otherwise normal EGD.   [facility] Kitchen GASTROSTOMY     peptic ulcer   Social History   Occupational History  . Not on file  Tobacco Use  . Smoking status: Former Smoker    Packs/day: 1.00    Years: 22.00    Pack years: 22.00    Types: Cigarettes    Last attempt to quit: 06/26/1986    Years since quitting: 31.8  . Smokeless tobacco: Never Used  Substance and Sexual Activity  . Alcohol use: No  . Drug use: No  . Sexual activity: Not Currently    Birth control/protection: None

## 2018-04-16 NOTE — Telephone Encounter (Signed)
Please add cbc Thanks! Valetta Fuller

## 2018-04-16 NOTE — Telephone Encounter (Signed)
Pt informed.  Pt is requesting a CBC be done with her labs since she is on anticoagulants and has a history of low platelets.  Please advise.  Charyl Bigger, CMA

## 2018-04-16 NOTE — Telephone Encounter (Signed)
Pt last had full panel of FBW in 07/2017.  Do you want any other labs drawn at her visit on 04/17/18 other than the Vitamin D?  Please advise.  Charyl Bigger, CMA

## 2018-04-17 ENCOUNTER — Other Ambulatory Visit (INDEPENDENT_AMBULATORY_CARE_PROVIDER_SITE_OTHER): Payer: Medicare HMO

## 2018-04-17 DIAGNOSIS — D696 Thrombocytopenia, unspecified: Secondary | ICD-10-CM

## 2018-04-17 DIAGNOSIS — E559 Vitamin D deficiency, unspecified: Secondary | ICD-10-CM

## 2018-04-18 LAB — CBC WITH DIFFERENTIAL/PLATELET
BASOS ABS: 0 10*3/uL (ref 0.0–0.2)
Basos: 1 %
EOS (ABSOLUTE): 0.1 10*3/uL (ref 0.0–0.4)
Eos: 3 %
Hematocrit: 37.1 % (ref 34.0–46.6)
Hemoglobin: 12.9 g/dL (ref 11.1–15.9)
IMMATURE GRANS (ABS): 0 10*3/uL (ref 0.0–0.1)
IMMATURE GRANULOCYTES: 1 %
Lymphocytes Absolute: 1.7 10*3/uL (ref 0.7–3.1)
Lymphs: 41 %
MCH: 33.9 pg — ABNORMAL HIGH (ref 26.6–33.0)
MCHC: 34.8 g/dL (ref 31.5–35.7)
MCV: 97 fL (ref 79–97)
MONOCYTES: 7 %
MONOS ABS: 0.3 10*3/uL (ref 0.1–0.9)
NEUTROS ABS: 2 10*3/uL (ref 1.4–7.0)
Neutrophils: 47 %
PLATELETS: 126 10*3/uL — AB (ref 150–450)
RBC: 3.81 x10E6/uL (ref 3.77–5.28)
RDW: 12 % — ABNORMAL LOW (ref 12.3–15.4)
WBC: 4.2 10*3/uL (ref 3.4–10.8)

## 2018-04-18 LAB — VITAMIN D 25 HYDROXY (VIT D DEFICIENCY, FRACTURES): Vit D, 25-Hydroxy: 15.5 ng/mL — ABNORMAL LOW (ref 30.0–100.0)

## 2018-04-19 ENCOUNTER — Other Ambulatory Visit: Payer: Self-pay | Admitting: Adult Health

## 2018-04-19 MED ORDER — VITAMIN D (ERGOCALCIFEROL) 1.25 MG (50000 UNIT) PO CAPS: 50000.0000 [IU] | ORAL_CAPSULE | ORAL | 0 refills | Status: DC

## 2018-04-19 NOTE — Telephone Encounter (Signed)
Received PA. Auth# [id number] Effective dates 04/16/18-05/31/18

## 2018-04-22 ENCOUNTER — Ambulatory Visit (INDEPENDENT_AMBULATORY_CARE_PROVIDER_SITE_OTHER): Payer: Self-pay

## 2018-04-22 ENCOUNTER — Ambulatory Visit (INDEPENDENT_AMBULATORY_CARE_PROVIDER_SITE_OTHER): Payer: Medicare HMO | Admitting: Physical Medicine and Rehabilitation

## 2018-04-22 ENCOUNTER — Other Ambulatory Visit (INDEPENDENT_AMBULATORY_CARE_PROVIDER_SITE_OTHER): Payer: Self-pay | Admitting: Specialist

## 2018-04-22 ENCOUNTER — Encounter (INDEPENDENT_AMBULATORY_CARE_PROVIDER_SITE_OTHER): Payer: Self-pay | Admitting: Physical Medicine and Rehabilitation

## 2018-04-22 VITALS — BP 132/77 | HR 91 | Temp 98.3°F

## 2018-04-22 DIAGNOSIS — M542 Cervicalgia: Secondary | ICD-10-CM

## 2018-04-22 DIAGNOSIS — M47812 Spondylosis without myelopathy or radiculopathy, cervical region: Secondary | ICD-10-CM

## 2018-04-22 MED ORDER — BUPIVACAINE HCL 0.25 % IJ SOLN: 2.0000 mL | Freq: Once | INTRAMUSCULAR | Status: DC

## 2018-04-22 MED ORDER — METHYLPREDNISOLONE ACETATE 80 MG/ML IJ SUSP: 80.0000 mg | Freq: Once | INTRAMUSCULAR | Status: DC

## 2018-04-22 NOTE — Telephone Encounter (Signed)
Gabapetnin 100mg  & 300mg 

## 2018-04-22 NOTE — Patient Instructions (Signed)

## 2018-04-22 NOTE — Progress Notes (Signed)
   Numeric Pain Rating Scale and Functional Assessment Average Pain 7   In the last MONTH (on 0-10 scale) has pain interfered with the following?  1. General activity like being  able to carry out your everyday physical activities such as walking, climbing stairs, carrying groceries, or moving a chair?  Rating(8)   +Driver, +BT(Plavix), -Dye Allergies.

## 2018-04-23 ENCOUNTER — Other Ambulatory Visit (INDEPENDENT_AMBULATORY_CARE_PROVIDER_SITE_OTHER): Payer: Self-pay | Admitting: Specialist

## 2018-04-23 MED ORDER — GABAPENTIN 100 MG PO CAPS: 100.0000 mg | ORAL_CAPSULE | Freq: Every day | ORAL | 3 refills | Status: DC

## 2018-04-25 NOTE — Telephone Encounter (Signed)
Gabapentin refill request, Please advise

## 2018-04-26 NOTE — Telephone Encounter (Signed)
Okay to refill but only give number 60 tablets with no extra refills.  Was supposed to follow-up with Dr. Louanne Skye last month.  Needs return office visit with him.

## 2018-05-06 NOTE — Progress Notes (Signed)
Amy Moon - 67 y.o. female MRN [id number]  Date of birth: [birth year]  Office Visit Note: Visit Date: 04/22/2018 PCP: [name], NP Referred by: [name], NP  Subjective: Chief Complaint  Patient presents with  . Neck - Pain   HPI:  Amy Moon is a 67 y.o. female who comes in today For repeat C2-3 and C3-4 medial branch facet joint blocks.  Prior injections gave her more than 70% relief were diagnostic.  She is failed conservative care and is continued to be followed for her chronic cervical neck pain by Dr. Louanne Skye.  Depending on results would look at radiofrequency ablation of the bilateral facet joints.  ROS Otherwise per HPI.  Assessment & Plan: Visit Diagnoses:  1. Cervical spondylosis without myelopathy   2. Cervicalgia     Plan: No additional findings.   Meds & Orders:  Meds ordered this encounter  Medications  . methylPREDNISolone acetate (DEPO-MEDROL) injection 80 mg  . bupivacaine (MARCAINE) 0.25 % (with pres) injection 2 mL    Orders Placed This Encounter  Procedures  . Facet Injection  . XR C-ARM NO REPORT    Follow-up: Return if symptoms worsen or fail to improve.   Procedures: No procedures performed  Diagnostic Cervical Facet Joint Nerve Block with Fluoroscopic Guidance  Patient: Amy Moon      Date of Birth: 1950/08/08 MRN: [id number] PCP: [name], NP      Visit Date: 04/22/2018   Universal Protocol:    Date/Time: 11/11/197:03 AM  Consent Given By: the patient  Position: PRONE  Additional Comments: Vital signs were monitored before and after the procedure. Patient was prepped and draped in the usual sterile fashion. The correct patient, procedure, and site was verified.   Injection Procedure Details:  Procedure Site One Meds Administered:  Meds ordered this encounter  Medications  . methylPREDNISolone acetate (DEPO-MEDROL) injection 80 mg  . bupivacaine (MARCAINE) 0.25 % (with pres) injection 2 mL      Laterality: Bilateral  Location/Site:  C2-3 C3-4  Needle size: 25 G  Needle type: Spinal  Needle Placement: Articular Pillar  Findings:  -Contrast Used: 0.5 mL iohexol 180 mg iodine/mL   -Comments: Excellent flow contrast of the articular pillars without intravascular flow.  Procedure Details: The fluoroscope beam was positioned to square off the endplates of the desired vertebral level to achieve a true AP position. The beam was then moved in a small "counter" oblique to the contralateral side with a small amount of caudal tilt to achieve a trajectory alignment with the desired nerves.  For each target described below the skin was anesthetized with 1 ml of 1% Lidocaine without epinephrine.   To block the facet joint nerve to C2, the needle was fluoroscopically positioned over the inferior lateral portion of the C2/3 facet joint nerve where the third occipital nerve (TON) lies.  To block the facet joint nerves from C3 through C7, the lateral masses of these respective levels were localized under fluoroscopic visualization.  A spinal needle was inserted down to the "waist" at the above mentioned cervical levels.  The  needle was then "walked off" until it rested just lateral to the trough of the lateral mass of the medial branch nerve, which innervates the cervical facet joint.  After contact with periosteum and negative aspirate for blood and CSF, correct placement without intravascular or epidural spread was confirmed by Bi-planar images and  injecting 0.5 ml. of Omnipaque-240.  A spot radiograph  was obtained of this image.  Next, a 0.5 ml. volume of 1% Lidocaine without Epinephrine was then injected.  Prior to the procedure, the patient was given a Pain Diary which was completed for baseline measurements.  After the procedure, the patient rated their pain every 30 minutes and will continue rating at this frequency for a total of 5 hours.  The patient has been asked to complete the  Diary and return to [place] by mail, fax or hand delivered as soon as possible.   Additional Comments:  The patient tolerated the procedure well Dressing: Band-Aid    Post-procedure details: Patient was observed during the procedure. Post-procedure instructions were reviewed.  Patient left the clinic in stable condition.      Clinical History: MRI CERVICAL SPINE WITHOUT CONTRAST  TECHNIQUE: Multiplanar, multisequence MR imaging of the cervical spine was performed. No intravenous contrast was administered.  COMPARISON:  Cervical spine radiographs 10/03/2017.  FINDINGS: Alignment: Improved cervical lordosis compared to the radiographs last month. Mild straightening of lordosis. No spondylolisthesis.  Vertebrae: Heterogeneous endplate marrow signal appears to be degenerative in nature throughout much of the cervical spine. There are areas of mild endplate marrow edema which appears degenerative (posteriorly at C3-C4). Furthermore, there is confluent marrow edema in the left C2-C3 and right C3-C4 facets (series 4, image 1 on the right). These facets are chronically degenerated.  No other acute osseous abnormality identified.  Cord: Spinal cord signal is within normal limits at all visualized levels.  Posterior Fossa, vertebral arteries, paraspinal tissues: Negative visible brain parenchyma. Cervicomedullary junction is within normal limits. Preserved major vascular flow voids in the neck. Several small T2 hyperintense thyroid nodules measure up to 11 mm and do not meet size criteria for ultrasound follow-up. Negative visible lung apices.  Disc levels:  C2-C3: Broad-based mild disc bulging. Moderate facet hypertrophy greater on the left, which corresponds to the site of facet marrow edema. Mild to moderate ligament flavum hypertrophy. Mild spinal stenosis. Mild cord mass effect. Mild left C3 foraminal stenosis.  C3-C4: Disc space loss with circumferential disc  osteophyte complex. Broad-based posterior component. Moderate facet and ligament flavum hypertrophy greater on the right, where there is associated degenerative marrow edema. Spinal stenosis with mild cord mass effect. Moderate to severe bilateral C4 foraminal stenosis greater on the right.  C4-C5: Circumferential disc bulge and endplate spurring with broad-based posterior and biforaminal involvement. Mild facet and ligament flavum hypertrophy. Mild spinal stenosis with no cord mass effect. Moderate bilateral C5 foraminal stenosis.  C5-C6: Circumferential disc bulge with mild endplate spurring. Broad-based left foraminal involvement. Mild facet hypertrophy greater on the left. No spinal stenosis. Mild to moderate left and mild right C6 foraminal stenosis.  C6-C7: Circumferential disc bulge with broad-based left foraminal component (series 2, image 10). Endplate spurring. Mild facet and ligament flavum hypertrophy. No spinal stenosis. Moderate to severe left C7 neural foraminal stenosis.  C7-T1:  Mild facet hypertrophy.  No stenosis.  Thoracic findings are reported separately today.  IMPRESSION: 1. Marrow edema suggesting acute exacerbation of chronic facet joint arthritis at the left C2-C3 and right C3-C4 levels. There is some associated posterior and rightward degenerative endplate marrow edema at C3-C4. 2. Multifactorial spinal stenosis with up to mild spinal cord mass effect C2-C3 through C4-C5. No cord signal abnormality. 3. Moderate or severe neural foraminal stenosis at the bilateral C4, left C7, and to a lesser extent bilateral C5 nerve levels.   Electronically Signed   By: Genevie Ann M.D.   On:  10/24/2017 09:24     Objective:  VS:  HT:    WT:   BMI:     BP:132/77  HR:91bpm  TEMP:98.3 F (36.8 C)( )  RESP:97 % Physical Exam  Ortho Exam Imaging: No results found.

## 2018-05-06 NOTE — Procedures (Signed)
Diagnostic Cervical Facet Joint Nerve Block with Fluoroscopic Guidance  Patient: Amy Moon      Date of Birth: April 29, 1951 MRN: [id number] PCP: [name], NP      Visit Date: 04/22/2018   Universal Protocol:    Date/Time: 11/11/197:03 AM  Consent Given By: the patient  Position: PRONE  Additional Comments: Vital signs were monitored before and after the procedure. Patient was prepped and draped in the usual sterile fashion. The correct patient, procedure, and site was verified.   Injection Procedure Details:  Procedure Site One Meds Administered:  Meds ordered this encounter  Medications  . methylPREDNISolone acetate (DEPO-MEDROL) injection 80 mg  . bupivacaine (MARCAINE) 0.25 % (with pres) injection 2 mL     Laterality: Bilateral  Location/Site:  C2-3 C3-4  Needle size: 25 G  Needle type: Spinal  Needle Placement: Articular Pillar  Findings:  -Contrast Used: 0.5 mL iohexol 180 mg iodine/mL   -Comments: Excellent flow contrast of the articular pillars without intravascular flow.  Procedure Details: The fluoroscope beam was positioned to square off the endplates of the desired vertebral level to achieve a true AP position. The beam was then moved in a small "counter" oblique to the contralateral side with a small amount of caudal tilt to achieve a trajectory alignment with the desired nerves.  For each target described below the skin was anesthetized with 1 ml of 1% Lidocaine without epinephrine.   To block the facet joint nerve to C2, the needle was fluoroscopically positioned over the inferior lateral portion of the C2/3 facet joint nerve where the third occipital nerve (TON) lies.  To block the facet joint nerves from C3 through C7, the lateral masses of these respective levels were localized under fluoroscopic visualization.  A spinal needle was inserted down to the "waist" at the above mentioned cervical levels.  The  needle was then "walked off" until  it rested just lateral to the trough of the lateral mass of the medial branch nerve, which innervates the cervical facet joint.  After contact with periosteum and negative aspirate for blood and CSF, correct placement without intravascular or epidural spread was confirmed by Bi-planar images and  injecting 0.5 ml. of Omnipaque-240.  A spot radiograph was obtained of this image.  Next, a 0.5 ml. volume of 1% Lidocaine without Epinephrine was then injected.  Prior to the procedure, the patient was given a Pain Diary which was completed for baseline measurements.  After the procedure, the patient rated their pain every 30 minutes and will continue rating at this frequency for a total of 5 hours.  The patient has been asked to complete the Diary and return to [place] by mail, fax or hand delivered as soon as possible.   Additional Comments:  The patient tolerated the procedure well Dressing: Band-Aid    Post-procedure details: Patient was observed during the procedure. Post-procedure instructions were reviewed.  Patient left the clinic in stable condition.

## 2018-05-07 ENCOUNTER — Other Ambulatory Visit (INDEPENDENT_AMBULATORY_CARE_PROVIDER_SITE_OTHER): Payer: Self-pay | Admitting: Specialist

## 2018-05-07 NOTE — Telephone Encounter (Signed)
clonazePAM refill request

## 2018-05-09 ENCOUNTER — Encounter: Payer: Self-pay | Admitting: Adult Health

## 2018-05-09 ENCOUNTER — Ambulatory Visit (INDEPENDENT_AMBULATORY_CARE_PROVIDER_SITE_OTHER): Payer: Medicare HMO | Admitting: Adult Health

## 2018-05-09 VITALS — BP 131/71 | HR 92 | Temp 98.5°F | Ht 60.0 in | Wt 151.9 lb

## 2018-05-09 DIAGNOSIS — Z1239 Encounter for other screening for malignant neoplasm of breast: Secondary | ICD-10-CM

## 2018-05-09 DIAGNOSIS — I10 Essential (primary) hypertension: Secondary | ICD-10-CM | POA: Diagnosis not present

## 2018-05-09 DIAGNOSIS — D696 Thrombocytopenia, unspecified: Secondary | ICD-10-CM

## 2018-05-09 DIAGNOSIS — Z23 Encounter for immunization: Secondary | ICD-10-CM

## 2018-05-09 DIAGNOSIS — Z0000 Encounter for general adult medical examination without abnormal findings: Secondary | ICD-10-CM

## 2018-05-09 HISTORY — DX: Encounter for general adult medical examination without abnormal findings: Z00.00

## 2018-05-09 MED ORDER — ZOSTER VAC RECOMB ADJUVANTED 50 MCG/0.5ML IM SUSR: 0.5000 mL | Freq: Once | INTRAMUSCULAR | 0 refills | Status: AC

## 2018-05-09 NOTE — Progress Notes (Signed)
Subjective:   Amy Moon is a 67 y.o. female who presents for Medicare Annual (Subsequent) preventive examination.  Review of Systems:  General:   Denies fever, chills, unexplained weight loss.  Optho/Auditory:   Denies visual changes, blurred vision/LOV Respiratory:   Denies SOB, DOE more than baseline levels.  Cardiovascular:   Denies chest pain, palpitations, new onset peripheral edema  Gastrointestinal:   Denies nausea, vomiting, diarrhea.  Genitourinary: Denies dysuria, freq/ urgency, flank pain or discharge from genitals.  Endocrine:     Denies hot or cold intolerance, polyuria, polydipsia. Musculoskeletal:   Denies unexplained myalgias, joint swelling, unexplained arthralgias, gait problems.  Skin:  Denies rash, suspicious lesions Neurological:     Denies dizziness, unexplained weakness, numbness, continues to have imbalance and upper extremity tremor Psychiatric/Behavioral:   Denies mood changes, suicidal or homicidal ideations, hallucinations     Objective:     Vitals: BP 131/71   Pulse 92   Temp 98.5 F (36.9 C) (Oral)   Ht 5' (1.524 m)   Wt 151 lb 14.4 oz (68.9 kg)   SpO2 96% Comment: on RA  BMI 29.67 kg/m   Body mass index is 29.67 kg/m.  Advanced Directives 07/30/2017 11/27/2015 11/26/2015 10/30/2015  Does Patient Have a Medical Advance Directive? No No No No  Would patient like information on creating a medical advance directive? Yes (MAU/Ambulatory/Procedural Areas - Information given) No - patient declined information No - patient declined information -    Tobacco Social History   Tobacco Use  Smoking Status Former Smoker  . Packs/day: 1.00  . Years: 22.00  . Pack years: 22.00  . Types: Cigarettes  . Last attempt to quit: 06/26/1986  . Years since quitting: 31.8  Smokeless Tobacco Never Used     Counseling given: Not Answered   Past Medical History:  Diagnosis Date  . Anemia   . Angina pectoris ([place])   . Arthritis   . Cancer (HCC)    squamous  cell carcinoma, nose  . Cataract   . Chronic kidney disease   . Clotting disorder ([place])   . Drug-induced low platelet count   . Duodenal ulcer   . GERD (gastroesophageal reflux disease)   . Granulomatous disease ([place])   . Hepatic disease   . Hiatal hernia   . Hypertension   . Low vitamin D level   . Neuropathy   . Osteopenia   . Osteoporosis   . Parkinson's disease ([place])   . Portal hypertension ([place])   . Splenomegaly a   Past Surgical History:  Procedure Laterality Date  . ABDOMINAL HYSTERECTOMY    . APPENDECTOMY    . BACK SURGERY    . ESOPHAGOGASTRODUODENOSCOPY  05/20/2008   Mild to moderate esophagitis. Otherwise normal EGD.   Marland Kitchen GASTROSTOMY     peptic ulcer   Family History  Problem Relation Age of Onset  . Cancer Mother        ovarian  . Hyperlipidemia Mother   . Alzheimer's disease Mother   . Arthritis Mother        rheumatoid  . Cancer Father        lung  . Parkinson's disease Father   . Alzheimer's disease Father   . ALS Sister   . Cancer Maternal Aunt        pancreatic, lung, liver,breast  . Cancer Maternal Uncle        pancreatic, liver, brain  . Heart attack Paternal Aunt   . Stroke Paternal Aunt   .  Heart attack Paternal Uncle   . Stroke Paternal Grandmother   . Heart attack Paternal Grandfather   . Arthritis Sister        rheumatoid  . Huntington's disease Daughter        presumed inherited from father per patient  . Colon cancer Maternal Uncle    Social History   Socioeconomic History  . Marital status: Married    Spouse name: Not on file  . Number of children: 3  . Years of education: Not on file  . Highest education level: Not on file  Occupational History  . Not on file  Social Needs  . Financial resource strain: Not on file  . Food insecurity:    Worry: Not on file    Inability: Not on file  . Transportation needs:    Medical: Not on file    Non-medical: Not on file  Tobacco Use  . Smoking status: Former Smoker    Packs/day:  1.00    Years: 22.00    Pack years: 22.00    Types: Cigarettes    Last attempt to quit: 06/26/1986    Years since quitting: 31.8  . Smokeless tobacco: Never Used  Substance and Sexual Activity  . Alcohol use: No  . Drug use: No  . Sexual activity: Not Currently    Birth control/protection: None  Lifestyle  . Physical activity:    Days per week: Not on file    Minutes per session: Not on file  . Stress: Not on file  Relationships  . Social connections:    Talks on phone: Not on file    Gets together: Not on file    Attends religious service: Not on file    Active member of club or organization: Not on file    Attends meetings of clubs or organizations: Not on file    Relationship status: Not on file  Other Topics Concern  . Not on file  Social History Narrative  . Not on file    Outpatient Encounter Medications as of 05/09/2018  Medication Sig  . clonazePAM (KLONOPIN) 0.5 MG tablet Take 0.5 tablets (0.25 mg total) by mouth 2 (two) times daily as needed for anxiety.  . clopidogrel (PLAVIX) 75 MG tablet TAKE 1 TABLET BY MOUTH DAILY.  . diphenhydrAMINE (BENADRYL) 50 MG tablet Take 50 mg by mouth at bedtime as needed for itching.  . gabapentin (NEURONTIN) 100 MG capsule Take 100 mg by mouth 3 (three) times daily.  Marland Kitchen gabapentin (NEURONTIN) 300 MG capsule Take 600 mg by mouth at bedtime.  . nitroGLYCERIN (NITROSTAT) 0.4 MG SL tablet Place 0.4 mg under the tongue as needed for chest pain.  Marland Kitchen omeprazole (PRILOSEC) 20 MG capsule Take 1 capsule (20 mg total) by mouth 2 (two) times daily.  . ranolazine (RANEXA) 500 MG 12 hr tablet TAKE 1 TABLET BY MOUTH 2 TIMES DAILY.  . traMADol (ULTRAM) 50 MG tablet TAKE 1 TABLET BY MOUTH EVERY 6 HOURS AS NEEDED FOR MODERATE PAIN  . Vitamin D, Ergocalciferol, (DRISDOL) 50000 units CAPS capsule Take 1 capsule (50,000 Units total) by mouth every 7 (seven) days.  . Zoster Vaccine Adjuvanted [facility]) injection Inject 0.5 mLs into the muscle once for 1  dose.  . [DISCONTINUED] gabapentin (NEURONTIN) 100 MG capsule Take 1 capsule (100 mg total) by mouth at bedtime. (Patient taking differently: Take 100 mg by mouth 3 (three) times daily. )  . [DISCONTINUED] gabapentin (NEURONTIN) 300 MG capsule TAKE 1 CAPSULE BY MOUTH EVERY  MORNING AND 2 CAPSULES AT BEDTIME   Facility-Administered Encounter Medications as of 05/09/2018  Medication  . bupivacaine (MARCAINE) 0.25 % (with pres) injection 2 mL  . methylPREDNISolone acetate (DEPO-MEDROL) injection 80 mg    Activities of Daily Living In your present state of health, do you have any difficulty performing the following activities: 05/09/2018 09/03/2017  Hearing? N N  Vision? Y Y  Difficulty concentrating or making decisions? Tempie Donning  Walking or climbing stairs? Y Y  Dressing or bathing? N Y  Doing errands, shopping? N N  Some recent data might be hidden    Patient Care Team: [name], NP as PCP - General (Family Medicine) Jackquline Denmark, MD as Consulting Physician (Gastroenterology)    Assessment:   This is a routine wellness examination for [place].  Exercise Activities and Dietary recommendations    Goals   None   Heart Healthy Diet, remain well hydrated Continue regular daily walking   Fall Risk Fall Risk  05/09/2018 11/12/2017 09/03/2017 07/30/2017  Falls in the past year? 1 Yes Yes Yes  Number falls in past yr: 1 2 or more 2 or more 2 or more  Injury with Fall? 0 No Yes Yes  Risk Factor Category  - High Fall Risk High Fall Risk -  Risk for fall due to : Impaired balance/gait - - -  Follow up Falls evaluation completed;Education provided;Falls prevention discussed Falls evaluation completed Falls prevention discussed;Education provided -   Is the patient's home free of loose throw rugs in walkways, pet beds, electrical cords, etc?   yes      Grab bars in the bathroom? yes      Handrails on the stairs?   yes      Adequate lighting?   yes  Timed Get Up and Go performed: 23  seconds, hx of imbalance issues   Depression Screen PHQ 2/9 Scores 11/05/2017 09/03/2017 07/30/2017  PHQ - 2 Score 1 4 1   PHQ- 9 Score 7 15 12      Cognitive Function     6CIT Screen 05/09/2018  What Year? 0 points  What month? 0 points  What time? 0 points  Count back from 20 0 points  Months in reverse 2 points  Repeat phrase 4 points  Total Score 6     There is no immunization history on file for this patient.  Qualifies for Shingles Vaccine?Yes, rx sent in   Screening Tests Health Maintenance  Topic Date Due  . MAMMOGRAM  07/04/1998  . COLONOSCOPY  07/04/1998  . INFLUENZA VACCINE  01/24/2018  . TETANUS/TDAP  07/30/2018 (Originally 07/05/1967)  . DEXA SCAN  Completed  . Hepatitis C Screening  Completed    Cancer Screenings: Lung: Low Dose CT Chest recommended if Age 25-80 years, 30 pack-year currently smoking OR have quit w/in 15years. Patient does not qualify. Breast:  Up to date on Mammogram? No  ordered Up to date of Bone Density/Dexa? Yes Colorectal: due, will discuss with her GI at f/u appt next month EDG completed 03/2018  Additional Screenings:  Hepatitis C Screening: UTD     Plan:  Heart Healthy Diet, continue to drink plenty of water Continue all medications as directed Mammogram ordered Needs to return for fasting labs Please discuss completing colonoscopy at your GI appt next month Continue daily walking Follow-up in 3 months  I have personally reviewed and noted the following in the patient's chart:   . Medical and social history . Use of alcohol, tobacco or  illicit drugs  . Current medications and supplements . Functional ability and status . Nutritional status . Physical activity . Advanced directives . List of other physicians . Hospitalizations, surgeries, and ER visits in previous 12 months . Vitals . Screenings to include cognitive, depression, and falls . Referrals and appointments  In addition, I have reviewed and discussed with  patient certain preventive protocols, quality metrics, and best practice recommendations. A written personalized care plan for preventive services as well as general preventive health recommendations were provided to patient.     [name], NP  05/09/2018

## 2018-05-09 NOTE — Patient Instructions (Signed)
  Ms. Kroboth , Thank you for taking time to come for your Medicare Wellness Visit. I appreciate your ongoing commitment to your health goals. Please review the following plan we discussed and let me know if I can assist you in the future.   These are the goals we discussed: Heart Healthy Diet, continue to drink plenty of water Continue all medications as directed Mammogram ordered Needs to return for fasting labs Please discuss completing colonoscopy at your GI appt next month Continue daily walking Follow-up in 3 months  This is a list of the screening recommended for you and due dates:  Health Maintenance  Topic Date Due  . Mammogram  07/04/1998  . Colon Cancer Screening  07/04/1998  . Flu Shot  01/24/2018  . Tetanus Vaccine  07/30/2018*  . DEXA scan (bone density measurement)  Completed  .  Hepatitis C: One time screening is recommended by Center for Disease Control  (CDC) for  adults born from [birth year] through 1967.   Completed  *Topic was postponed. The date shown is not the original due date.

## 2018-05-09 NOTE — Assessment & Plan Note (Signed)
Heart Healthy Diet, continue to drink plenty of water Continue all medications as directed Mammogram ordered Needs to return for fasting labs Please discuss completing colonoscopy at your GI appt next month Continue daily walking Follow-up in 3 months

## 2018-05-13 NOTE — Telephone Encounter (Signed)
Closing encounter

## 2018-05-16 ENCOUNTER — Other Ambulatory Visit (INDEPENDENT_AMBULATORY_CARE_PROVIDER_SITE_OTHER): Payer: Self-pay | Admitting: Surgery

## 2018-05-16 ENCOUNTER — Other Ambulatory Visit (INDEPENDENT_AMBULATORY_CARE_PROVIDER_SITE_OTHER): Payer: Self-pay | Admitting: Specialist

## 2018-05-16 ENCOUNTER — Ambulatory Visit (INDEPENDENT_AMBULATORY_CARE_PROVIDER_SITE_OTHER): Payer: Medicare HMO | Admitting: Specialist

## 2018-05-16 NOTE — Telephone Encounter (Signed)
Gabapentin refill request 

## 2018-05-16 NOTE — Telephone Encounter (Signed)
ClonazePAM refill request

## 2018-05-21 ENCOUNTER — Other Ambulatory Visit: Payer: Self-pay

## 2018-05-27 ENCOUNTER — Other Ambulatory Visit: Payer: Self-pay

## 2018-05-27 NOTE — Telephone Encounter (Signed)
We have not prescribed these medications for the patient previously.  Previous RX came from Dr. Louanne Skye.  I received refill request for this medication on 05/21/18 and denied it "prescriber not at this practice".   [place] Controlled Substance database reviewed and no aberrancies noted. Please review and refill if appropriate.  Charyl Bigger, CMA

## 2018-05-28 ENCOUNTER — Other Ambulatory Visit (INDEPENDENT_AMBULATORY_CARE_PROVIDER_SITE_OTHER): Payer: Self-pay | Admitting: Specialist

## 2018-05-28 NOTE — Telephone Encounter (Signed)
Good Morning Tonya, All controlled substances should come from Dr. Louanne Skye, he has been prescribing her clonazepam. I will not refill Thanks! Valetta Fuller

## 2018-05-28 NOTE — Telephone Encounter (Signed)
Clonazepam refill request

## 2018-06-02 NOTE — Progress Notes (Signed)
Subjective:    Patient ID: Amy Moon, female    DOB: 02-19-1951, 67 y.o.   MRN: [id number]  HPI:  Amy Moon presents with non-productive cough, sore throat (2/10), post nasal gtt, and low grade fever (highest 101), chills, and fatigue that has been occurring >2 weeks. She has been increasing fluids and using OTC Acetaminophen and NyQuil, last dose of Acetaminophen was 2 hrs ago, current temp 98.3 f She denies N/V/D She denies CP/palpitations She reports minor "chest tightness when I am coughing hard" She denies tobacco/vape use  Patient Care Team    Relationship Specialty Notifications Start End  Mina Marble D, NP PCP - General Family Medicine  07/05/17   Jackquline Denmark, MD Consulting Physician Gastroenterology  05/09/18     Patient Active Problem List   Diagnosis Date Noted  . Bronchitis 06/03/2018  . Encounter for Medicare annual wellness exam 05/09/2018  . Vitamin D deficiency 11/05/2017  . Encounter for hepatitis C virus screening test for high risk patient 11/05/2017  . Dysphagia 09/03/2017  . Thrombocytopenia ([place]) 09/03/2017  . Healthcare maintenance 07/30/2017  . Chronic pain syndrome 07/30/2017  . Chest pain 07/30/2017  . Parkinson's disease ([place]) 07/30/2017  . Hypertension 07/30/2017  . Severe obesity (BMI >= 40) ([place]) 05/15/2016  . Severe episode of recurrent major depressive disorder, without psychotic features ([place]) 05/15/2016  . Tremor of both hands 05/15/2016  . Irritant contact dermatitis 08/25/2015     Past Medical History:  Diagnosis Date  . Anemia   . Angina pectoris ([place])   . Arthritis   . Cancer (HCC)    squamous cell carcinoma, nose  . Cataract   . Chronic kidney disease   . Clotting disorder ([place])   . Drug-induced low platelet count   . Duodenal ulcer   . GERD (gastroesophageal reflux disease)   . Granulomatous disease ([place])   . Hepatic disease   . Hiatal hernia   . Hypertension   . Low vitamin D level   . Neuropathy   . Osteopenia    . Osteoporosis   . Parkinson's disease ([place])   . Portal hypertension ([place])   . Splenomegaly a     Past Surgical History:  Procedure Laterality Date  . ABDOMINAL HYSTERECTOMY    . APPENDECTOMY    . BACK SURGERY    . ESOPHAGOGASTRODUODENOSCOPY  05/20/2008   Mild to moderate esophagitis. Otherwise normal EGD.   Marland Kitchen GASTROSTOMY     peptic ulcer     Family History  Problem Relation Age of Onset  . Cancer Mother        ovarian  . Hyperlipidemia Mother   . Alzheimer's disease Mother   . Arthritis Mother        rheumatoid  . Cancer Father        lung  . Parkinson's disease Father   . Alzheimer's disease Father   . ALS Sister   . Cancer Maternal Aunt        pancreatic, lung, liver,breast  . Cancer Maternal Uncle        pancreatic, liver, brain  . Heart attack Paternal Aunt   . Stroke Paternal Aunt   . Heart attack Paternal Uncle   . Stroke Paternal Grandmother   . Heart attack Paternal Grandfather   . Arthritis Sister        rheumatoid  . Huntington's disease Daughter        presumed inherited from father per patient  . Colon cancer Maternal Uncle  Social History   Substance and Sexual Activity  Drug Use No     Social History   Substance and Sexual Activity  Alcohol Use No     Social History   Tobacco Use  Smoking Status Former Smoker  . Packs/day: 1.00  . Years: 22.00  . Pack years: 22.00  . Types: Cigarettes  . Last attempt to quit: 06/26/1986  . Years since quitting: 31.9  Smokeless Tobacco Never Used     Outpatient Encounter Medications as of 06/03/2018  Medication Sig  . clonazePAM (KLONOPIN) 0.5 MG tablet Take 0.5 tablets (0.25 mg total) by mouth 2 (two) times daily as needed for anxiety.  . clopidogrel (PLAVIX) 75 MG tablet TAKE 1 TABLET BY MOUTH DAILY.  . diphenhydrAMINE (BENADRYL) 50 MG tablet Take 50 mg by mouth at bedtime as needed for itching.  . gabapentin (NEURONTIN) 100 MG capsule Take 100 mg by mouth 3 (three) times daily.  Marland Kitchen  gabapentin (NEURONTIN) 300 MG capsule Take 600 mg by mouth at bedtime.  . nitroGLYCERIN (NITROSTAT) 0.4 MG SL tablet Place 0.4 mg under the tongue as needed for chest pain.  Marland Kitchen omeprazole (PRILOSEC) 20 MG capsule Take 1 capsule (20 mg total) by mouth 2 (two) times daily.  . ranolazine (RANEXA) 500 MG 12 hr tablet TAKE 1 TABLET BY MOUTH 2 TIMES DAILY.  . traMADol (ULTRAM) 50 MG tablet TAKE 1 TABLET BY MOUTH EVERY 6 HOURS AS NEEDED FOR MODERATE PAIN  . Vitamin D, Ergocalciferol, (DRISDOL) 50000 units CAPS capsule Take 1 capsule (50,000 Units total) by mouth every 7 (seven) days.  . [DISCONTINUED] gabapentin (NEURONTIN) 300 MG capsule TAKE 1 CAPSULE BY MOUTH EVERY MORNING AND 2 CAPSULES AT BEDTIME  . albuterol (PROVENTIL HFA;VENTOLIN HFA) 108 (90 Base) MCG/ACT inhaler Inhale 2 puffs into the lungs every 6 (six) hours as needed for wheezing or shortness of breath.  . benzonatate (TESSALON) 100 MG capsule Take 1 capsule (100 mg total) by mouth 2 (two) times daily as needed for cough.  . doxycycline (VIBRA-TABS) 100 MG tablet Take 1 tablet (100 mg total) by mouth 2 (two) times daily.   Facility-Administered Encounter Medications as of 06/03/2018  Medication  . bupivacaine (MARCAINE) 0.25 % (with pres) injection 2 mL  . methylPREDNISolone acetate (DEPO-MEDROL) injection 80 mg    Allergies: Nsaids; Inderal [propranolol]; Indomethacin; Penicillin g; Pneumococcal vaccines; Scopolamine; Sinemet [carbidopa w-levodopa]; Tape; and Latex  Body mass index is 29.28 kg/m.  Blood pressure (!) 145/84, pulse 92, temperature 98.3 F (36.8 C), temperature source Oral, height 5' (1.524 m), weight 149 lb 14.4 oz (68 kg), SpO2 96 %.  Review of Systems  Constitutional: Positive for activity change, chills, fatigue and fever. Negative for appetite change, diaphoresis and unexpected weight change.  HENT: Positive for congestion, postnasal drip, rhinorrhea, sinus pressure, sinus pain, sore throat and voice change.    Eyes: Negative for visual disturbance.  Respiratory: Positive for cough and chest tightness. Negative for shortness of breath, wheezing and stridor.   Cardiovascular: Negative for chest pain, palpitations and leg swelling.  Gastrointestinal: Negative for abdominal distention, anal bleeding, constipation, nausea and vomiting.  Musculoskeletal: Positive for arthralgias, back pain, gait problem, joint swelling, myalgias, neck pain and neck stiffness.  Skin: Negative for color change, pallor and rash.  Neurological: Negative for dizziness and headaches.  Hematological: Does not bruise/bleed easily.  Psychiatric/Behavioral: Positive for sleep disturbance.       Objective:   Physical Exam  Constitutional: She is oriented to person, place, and  time. She appears well-developed and well-nourished. No distress.  HENT:  Head: Normocephalic and atraumatic.  Right Ear: External ear normal. Tympanic membrane is erythematous and bulging. Decreased hearing is noted.  Left Ear: External ear normal. Tympanic membrane is erythematous and bulging. Decreased hearing is noted.  Nose: Mucosal edema and rhinorrhea present. Right sinus exhibits maxillary sinus tenderness and frontal sinus tenderness. Left sinus exhibits maxillary sinus tenderness and frontal sinus tenderness.  Mouth/Throat: Uvula is midline and mucous membranes are normal. Posterior oropharyngeal edema and posterior oropharyngeal erythema present. Tonsils are 0 on the right. Tonsils are 0 on the left. No tonsillar exudate.  Eyes: Pupils are equal, round, and reactive to light. Conjunctivae and EOM are normal.  Neck: Normal range of motion. Neck supple.  Cardiovascular: Normal rate, regular rhythm, normal heart sounds and intact distal pulses.  No murmur heard. Pulmonary/Chest: Effort normal and breath sounds normal. No stridor. No respiratory distress. She has no wheezes. She has no rales. She exhibits no tenderness.  Lymphadenopathy:    She has  no cervical adenopathy.  Neurological: She is alert and oriented to person, place, and time.  Skin: Skin is warm and dry. Capillary refill takes less than 2 seconds. No rash noted. She is not diaphoretic. No erythema. No pallor.  Psychiatric: She has a normal mood and affect. Her behavior is normal. Judgment and thought content normal.  Nursing note and vitals reviewed.     Assessment & Plan:   1. Bronchitis     Bronchitis Please take Doxycycline as directed. Please take Tessalon and ProAir as needed. Continue to push fluids and rest as often as possible. Continue OTC Acetaminophen as needed for fever/discomfort. Continue once weekly Ergocalciferol and also recommend adding once daily OTC multi-vitamin. If symptoms persist after antibiotic, please make an office appt.    FOLLOW-UP:  Return if symptoms worsen or fail to improve.

## 2018-06-03 ENCOUNTER — Encounter: Payer: Self-pay | Admitting: Adult Health

## 2018-06-03 ENCOUNTER — Ambulatory Visit (INDEPENDENT_AMBULATORY_CARE_PROVIDER_SITE_OTHER): Payer: Medicare HMO | Admitting: Adult Health

## 2018-06-03 VITALS — BP 145/84 | HR 92 | Temp 98.3°F | Ht 60.0 in | Wt 149.9 lb

## 2018-06-03 DIAGNOSIS — J40 Bronchitis, not specified as acute or chronic: Secondary | ICD-10-CM

## 2018-06-03 HISTORY — DX: Bronchitis, not specified as acute or chronic: J40

## 2018-06-03 MED ORDER — BENZONATATE 100 MG PO CAPS: 100.0000 mg | ORAL_CAPSULE | Freq: Two times a day (BID) | ORAL | 0 refills | Status: DC | PRN

## 2018-06-03 MED ORDER — DOXYCYCLINE HYCLATE 100 MG PO TABS: 100.0000 mg | ORAL_TABLET | Freq: Two times a day (BID) | ORAL | 0 refills | Status: DC

## 2018-06-03 MED ORDER — ALBUTEROL SULFATE HFA 108 (90 BASE) MCG/ACT IN AERS: 2.0000 | INHALATION_SPRAY | Freq: Four times a day (QID) | RESPIRATORY_TRACT | 0 refills | Status: DC | PRN

## 2018-06-03 NOTE — Patient Instructions (Signed)
Acute Bronchitis, Adult Acute bronchitis is sudden (acute) swelling of the air tubes (bronchi) in the lungs. Acute bronchitis causes these tubes to fill with mucus, which can make it hard to breathe. It can also cause coughing or wheezing. In adults, acute bronchitis usually goes away within 2 weeks. A cough caused by bronchitis may last up to 3 weeks. Smoking, allergies, and asthma can make the condition worse. Repeated episodes of bronchitis may cause further lung problems, such as chronic obstructive pulmonary disease (COPD). What are the causes? This condition can be caused by germs and by substances that irritate the lungs, including:  Cold and flu viruses. This condition is most often caused by the same virus that causes a cold.  Bacteria.  Exposure to tobacco smoke, dust, fumes, and air pollution.  What increases the risk? This condition is more likely to develop in people who:  Have close contact with someone with acute bronchitis.  Are exposed to lung irritants, such as tobacco smoke, dust, fumes, and vapors.  Have a weak immune system.  Have a respiratory condition such as asthma.  What are the signs or symptoms? Symptoms of this condition include:  A cough.  Coughing up clear, yellow, or green mucus.  Wheezing.  Chest congestion.  Shortness of breath.  A fever.  Body aches.  Chills.  A sore throat.  How is this diagnosed? This condition is usually diagnosed with a physical exam. During the exam, your health care provider may order tests, such as chest X-rays, to rule out other conditions. He or she may also:  Test a sample of your mucus for bacterial infection.  Check the level of oxygen in your blood. This is done to check for pneumonia.  Do a chest X-ray or lung function testing to rule out pneumonia and other conditions.  Perform blood tests.  Your health care provider will also ask about your symptoms and medical history. How is this  treated? Most cases of acute bronchitis clear up over time without treatment. Your health care provider may recommend:  Drinking more fluids. Drinking more makes your mucus thinner, which may make it easier to breathe.  Taking a medicine for a fever or cough.  Taking an antibiotic medicine.  Using an inhaler to help improve shortness of breath and to control a cough.  Using a cool mist vaporizer or humidifier to make it easier to breathe.  Follow these instructions at home: Medicines  Take over-the-counter and prescription medicines only as told by your health care provider.  If you were prescribed an antibiotic, take it as told by your health care provider. Do not stop taking the antibiotic even if you start to feel better. General instructions  Get plenty of rest.  Drink enough fluids to keep your urine clear or pale yellow.  Avoid smoking and secondhand smoke. Exposure to cigarette smoke or irritating chemicals will make bronchitis worse. If you smoke and you need help quitting, ask your health care provider. Quitting smoking will help your lungs heal faster.  Use an inhaler, cool mist vaporizer, or humidifier as told by your health care provider.  Keep all follow-up visits as told by your health care provider. This is important. How is this prevented? To lower your risk of getting this condition again:  Wash your hands often with soap and water. If soap and water are not available, use hand sanitizer.  Avoid contact with people who have cold symptoms.  Try not to touch your hands to your   mouth, nose, or eyes.  Make sure to get the flu shot every year.  Contact a health care provider if:  Your symptoms do not improve in 2 weeks of treatment. Get help right away if:  You cough up blood.  You have chest pain.  You have severe shortness of breath.  You become dehydrated.  You faint or keep feeling like you are going to faint.  You keep vomiting.  You have a  severe headache.  Your fever or chills gets worse. This information is not intended to replace advice given to you by your health care provider. Make sure you discuss any questions you have with your health care provider. Document Released: 07/20/2004 Document Revised: 01/05/2016 Document Reviewed: 12/01/2015 Elsevier Interactive Patient Education  [employer].  Please take Doxycycline as directed. Please take Tessalon and ProAir as needed. Continue to push fluids and rest as often as possible. Continue OTC Acetaminophen as needed for fever/discomfort. Continue once weekly Ergocalciferol and also recommend adding once daily OTC multi-vitamin. If symptoms persist after antibiotic, please make an office appt. FEEL BETTER!

## 2018-06-03 NOTE — Assessment & Plan Note (Signed)
Please take Doxycycline as directed. Please take Tessalon and ProAir as needed. Continue to push fluids and rest as often as possible. Continue OTC Acetaminophen as needed for fever/discomfort. Continue once weekly Ergocalciferol and also recommend adding once daily OTC multi-vitamin. If symptoms persist after antibiotic, please make an office appt.

## 2018-06-18 ENCOUNTER — Other Ambulatory Visit (INDEPENDENT_AMBULATORY_CARE_PROVIDER_SITE_OTHER): Payer: Self-pay | Admitting: Specialist

## 2018-06-20 ENCOUNTER — Ambulatory Visit: Payer: Medicare HMO | Admitting: Gastroenterology

## 2018-06-20 NOTE — Telephone Encounter (Signed)
Can you please advised on this?

## 2018-06-21 NOTE — Telephone Encounter (Signed)
Ok to refill 

## 2018-06-24 ENCOUNTER — Other Ambulatory Visit (INDEPENDENT_AMBULATORY_CARE_PROVIDER_SITE_OTHER): Payer: Self-pay | Admitting: Specialist

## 2018-06-24 NOTE — Telephone Encounter (Signed)
Ok refill.  She can come back to see Dr. Louanne Skye in 1 month.  Thank you

## 2018-06-24 NOTE — Telephone Encounter (Signed)
Can you please advise for Dr. Louanne Skye?

## 2018-06-25 ENCOUNTER — Ambulatory Visit
Admission: RE | Admit: 2018-06-25 | Discharge: 2018-06-25 | Disposition: A | Discharge status: Home / Self Care | Payer: Medicare HMO | Source: Ambulatory Visit | Attending: Adult Health | Admitting: Adult Health

## 2018-06-25 DIAGNOSIS — Z1239 Encounter for other screening for malignant neoplasm of breast: Secondary | ICD-10-CM

## 2018-06-25 DIAGNOSIS — Z0000 Encounter for general adult medical examination without abnormal findings: Secondary | ICD-10-CM

## 2018-06-27 ENCOUNTER — Other Ambulatory Visit: Payer: Self-pay | Admitting: Adult Health

## 2018-06-27 DIAGNOSIS — R928 Other abnormal and inconclusive findings on diagnostic imaging of breast: Secondary | ICD-10-CM

## 2018-07-01 ENCOUNTER — Other Ambulatory Visit (INDEPENDENT_AMBULATORY_CARE_PROVIDER_SITE_OTHER): Payer: Medicare HMO

## 2018-07-01 ENCOUNTER — Encounter: Payer: Self-pay | Admitting: Gastroenterology

## 2018-07-01 ENCOUNTER — Ambulatory Visit (INDEPENDENT_AMBULATORY_CARE_PROVIDER_SITE_OTHER): Payer: Medicare HMO | Admitting: Gastroenterology

## 2018-07-01 ENCOUNTER — Telehealth: Payer: Self-pay

## 2018-07-01 VITALS — BP 130/82 | HR 84 | Ht 60.0 in | Wt 154.4 lb

## 2018-07-01 DIAGNOSIS — K5900 Constipation, unspecified: Secondary | ICD-10-CM

## 2018-07-01 DIAGNOSIS — K219 Gastro-esophageal reflux disease without esophagitis: Secondary | ICD-10-CM | POA: Diagnosis not present

## 2018-07-01 DIAGNOSIS — Z1211 Encounter for screening for malignant neoplasm of colon: Secondary | ICD-10-CM

## 2018-07-01 LAB — COMPREHENSIVE METABOLIC PANEL
ALT: 13 U/L (ref 0–35)
AST: 15 U/L (ref 0–37)
Albumin: 4.2 g/dL (ref 3.5–5.2)
Alkaline Phosphatase: 78 U/L (ref 39–117)
BILIRUBIN TOTAL: 0.6 mg/dL (ref 0.2–1.2)
BUN: 10 mg/dL (ref 6–23)
CO2: 28 mEq/L (ref 19–32)
Calcium: 9.5 mg/dL (ref 8.4–10.5)
Chloride: 106 mEq/L (ref 96–112)
Creatinine, Ser: 0.6 mg/dL (ref 0.40–1.20)
GFR: 105.05 mL/min (ref >60.00)
Glucose, Bld: 84 mg/dL (ref 70–99)
Potassium: 4.7 mEq/L (ref 3.5–5.1)
Sodium: 140 mEq/L (ref 135–145)
Total Protein: 6.8 g/dL (ref 6.0–8.3)

## 2018-07-01 LAB — TSH: TSH: 0.8 u[IU]/mL (ref 0.35–4.50)

## 2018-07-01 LAB — CBC WITH DIFFERENTIAL/PLATELET
Basophils Absolute: 0 10*3/uL (ref 0.0–0.1)
Basophils Relative: 0.5 % (ref 0.0–3.0)
Eosinophils Absolute: 0.1 10*3/uL (ref 0.0–0.7)
Eosinophils Relative: 1.9 % (ref 0.0–5.0)
HCT: 38.9 % (ref 36.0–46.0)
Hemoglobin: 13.3 g/dL (ref 12.0–15.0)
Lymphocytes Relative: 24.2 % (ref 12.0–46.0)
Lymphs Abs: 1.4 10*3/uL (ref 0.7–4.0)
MCHC: 34.3 g/dL (ref 30.0–36.0)
MCV: 99.7 fl (ref 78.0–100.0)
Monocytes Absolute: 0.3 10*3/uL (ref 0.1–1.0)
Monocytes Relative: 5.8 % (ref 3.0–12.0)
Neutro Abs: 3.9 10*3/uL (ref 1.4–7.7)
Neutrophils Relative %: 67.6 % (ref 43.0–77.0)
Platelets: 119 10*3/uL — ABNORMAL LOW (ref 150.0–400.0)
RBC: 3.9 Mil/uL (ref 3.87–5.11)
RDW: 12.7 % (ref 11.5–15.5)
WBC: 5.8 10*3/uL (ref 4.0–10.5)

## 2018-07-01 LAB — PROTIME-INR
INR: 1.1 ratio — ABNORMAL HIGH (ref 0.8–1.0)
Prothrombin Time: 12.3 s (ref 9.6–13.1)

## 2018-07-01 LAB — HEMOGLOBIN A1C: Hgb A1c MFr Bld: 4.7 % (ref 4.6–6.5)

## 2018-07-01 LAB — AMMONIA: Ammonia: 30 umol/L (ref 11–35)

## 2018-07-01 MED ORDER — PLECANATIDE 3 MG PO TABS: 3.0000 mg | ORAL_TABLET | Freq: Every day | ORAL | 0 refills | Status: DC

## 2018-07-01 MED ORDER — SUPREP BOWEL PREP KIT 17.5-3.13-1.6 GM/177ML PO SOLN: 1.0000 | ORAL | 0 refills | Status: DC

## 2018-07-01 NOTE — Telephone Encounter (Signed)
   Primary Cardiologist: Jenne Campus, MD  68 yo female with chest pain, HTN, GERD, cryptogenic early liver cirrhosis.  She was last seen by Dr. Agustin Cree in 02/2018.  Echo in 5/19 demonstrated normal EF.  Myoview in 6/19 was low risk.  The patient was contacted at home by phone today.  She has not had any change in her baseline symptoms since last seen.  Overall, she feels she is doing better.  She has not had any limitations to most activities.    The Revised Cardiac Risk Index indicates that her perioperative risk of major cardiac event is low at 0.4%.  Therefore, she does not require any further cardiac testing prior to her non cardiac procedure and may proceed at acceptable risk.     She was able to hold Plavix for her EGD in 02/2018.  Therefore, she may hold Plavix for 5 days prior to her procedure and resume post op when felt to be safe.  Richardson Dopp, PA-C 07/01/2018, 4:39 PM

## 2018-07-01 NOTE — Progress Notes (Signed)
IMPRESSION and PLAN:    #1. GERD with  dysphagia, GdA EE, 2 cm HH and Schatzki ring s/p EGD with dil 52Fr 03/2018 with resolution of dysphagia. H/O food impaction 2003 s/p endoscopic disimpaction. Has component of oropharyngeal dysphagia d/t ?  Parkinson's.  Had PEJ 2012 (removed 2017). Has postprandial epigastric discomfort and nausea.  #2. Colorectal cancer screening.  #3. Cryptogenic early liver cirrhosis (Dx on [place] 02/2018, alb 4.6 2019) with borderline splenomegaly with borderline platelet count. Likely d/t NASH. No ETOH, neg Heb C. No varices on EGD 03/2018 or ascites on [place] 02/2018  #4. Chronic constipation. Failed miralax, colace.  #5. Asymptomatic cholelithiasis.   Plan: - Omeprazole 20mg  bid to continue - Solid-phase gastric emptying scan to rule out gastroparesis. - CBC, CMP, AFP, PT, TSH, HBA1c and ammonia today. (Did not order lipid profile as she was not fasting) - Cardiology clearence to hold plavix 5 days before and ranexa for  Colon after 2-day prep (Had recent 2DE and stress test with Dr Raliegh Ip) - Trulance 1/day.  Samples given. - If still with problems and the above work-up is negative, proceed with CT abdo/pelvis. - Encouraged her to lose weight.   HPI:    Chief Complaint:   Amy Moon is a 68 y.o. female  For follow-up visit Status post EGD with esophageal dilatation 04/05/2018 with complete resolution of dysphagia. Has postprandial epigastric discomfort-feels like food stays in the stomach for a longer time.  She would sometimes burp up food eaten 24 hours before.  No gastric outlet obstruction on EGD. Also complains of nausea especially at night.  No vomiting. Very nice patient brings in cake for the staff today Has longstanding history of chronic constipation-bowel bowel movements at the frequency of 1 and 2 to 3 days.  Has tried MiraLAX and Colace without any significant relief.  Never had colonoscopy.  She is willing to get her colonoscopy  performed.  No fever chills or night sweats Denies having any melena or hematochezia.  No change in mental status.  Has history of easy bruisability and has been on Plavix and Ranexa.  Being followed by Dr. Raliegh Ip.  No family history of colon cancer in a first-degree relative.   Past Medical History:  Diagnosis Date  . Angina pectoris ([place])   . Chronic kidney disease   . Drug-induced low platelet count   . Duodenal ulcer   . Granulomatous disease ([place])   . Hepatic disease   . Hypertension   . Low vitamin D level   . Neuropathy   . Osteopenia   . Parkinson's disease ([place])       . Splenomegaly a    Current Outpatient Medications  Medication Sig Dispense Refill  . albuterol (PROVENTIL HFA;VENTOLIN HFA) 108 (90 Base) MCG/ACT inhaler Inhale 2 puffs into the lungs every 6 (six) hours as needed for wheezing or shortness of breath. 1 Inhaler 0  . benzonatate (TESSALON) 100 MG capsule Take 1 capsule (100 mg total) by mouth 2 (two) times daily as needed for cough. 20 capsule 0  . clopidogrel (PLAVIX) 75 MG tablet TAKE 1 TABLET BY MOUTH DAILY. 90 tablet 2  . diphenhydrAMINE (BENADRYL) 50 MG tablet Take 50 mg by mouth at bedtime as needed for itching.    Marland Kitchen doxycycline (VIBRA-TABS) 100 MG tablet Take 1 tablet (100 mg total) by mouth 2 (two) times daily. 20 tablet 0  . gabapentin (NEURONTIN) 100 MG capsule Take 100 mg by mouth 3 (three)  times daily.    Marland Kitchen gabapentin (NEURONTIN) 300 MG capsule TAKE 1 CAPSULE BY MOUTH EVERY MORNING AND 2 CAPSULES AT BEDTIME (Patient taking differently: 600 mg at bedtime. Only does 2 capsule at night time) 60 capsule 0  . nitroGLYCERIN (NITROSTAT) 0.4 MG SL tablet Place 0.4 mg under the tongue as needed for chest pain.    Marland Kitchen omeprazole (PRILOSEC) 20 MG capsule Take 1 capsule (20 mg total) by mouth 2 (two) times daily. 60 capsule 3  . ranolazine (RANEXA) 500 MG 12 hr tablet TAKE 1 TABLET BY MOUTH 2 TIMES DAILY. 180 tablet 2  . traMADol (ULTRAM) 50 MG tablet TAKE 1  TABLET BY MOUTH EVERY 6 HOURS 30 tablet 0  . Vitamin D, Ergocalciferol, (DRISDOL) 50000 units CAPS capsule Take 1 capsule (50,000 Units total) by mouth every 7 (seven) days. 16 capsule 0   Current Facility-Administered Medications  Medication Dose Route Frequency Provider Last Rate Last Dose  . bupivacaine (MARCAINE) 0.25 % (with pres) injection 2 mL  2 mL Other Once Magnus Sinning, MD      . methylPREDNISolone acetate (DEPO-MEDROL) injection 80 mg  80 mg Other Once Magnus Sinning, MD        Past Surgical History:  Procedure Laterality Date  . ABDOMINAL HYSTERECTOMY    . APPENDECTOMY    . BACK SURGERY    . ESOPHAGOGASTRODUODENOSCOPY  05/20/2008   Mild to moderate esophagitis. Otherwise normal EGD.   Marland Kitchen GASTROSTOMY     peptic ulcer    Family History  Problem Relation Age of Onset  . Cancer Mother        ovarian  . Hyperlipidemia Mother   . Alzheimer's disease Mother   . Arthritis Mother        rheumatoid  . Cancer Father        lung  . Parkinson's disease Father   . Alzheimer's disease Father   . ALS Sister   . Cancer Maternal Aunt        pancreatic, lung, liver,breast  . Breast cancer Maternal Aunt   . Cancer Maternal Uncle        pancreatic, liver, brain  . Heart attack Paternal Aunt   . Stroke Paternal Aunt   . Heart attack Paternal Uncle   . Stroke Paternal Grandmother   . Heart attack Paternal Grandfather   . Arthritis Sister        rheumatoid  . Huntington's disease Daughter        presumed inherited from father per patient  . Colon cancer Maternal Uncle   . Breast cancer Maternal Aunt   . Breast cancer Maternal Aunt     Social History   Tobacco Use  . Smoking status: Former Smoker    Packs/day: 1.00    Years: 22.00    Pack years: 22.00    Types: Cigarettes    Last attempt to quit: 06/26/1986    Years since quitting: 32.0  . Smokeless tobacco: Never Used  Substance Use Topics  . Alcohol use: No  . Drug use: No    Allergies  Allergen Reactions   . Nsaids Other (See Comments)    History of bleeding ulcers  . Inderal [Propranolol]   . Indomethacin Other (See Comments)    Unknown  . Penicillin G Other (See Comments)    Unknown  . Pneumococcal Vaccines Other (See Comments)    Caused "pneumonia"  . Scopolamine     The patch, caused her to pass out / change in mental  status  . Sinemet [Carbidopa W-Levodopa] Nausea And Vomiting  . Tape   . Latex Hives and Rash     Review of Systems: All systems reviewed and negative except where noted in HPI.    Physical Exam:     BP 130/82   Pulse 84   Ht 5' (1.524 m)   Wt 154 lb 6 oz (70 kg)   BMI 30.15 kg/m  @WEIGHTLAST3 @ GENERAL:  Alert, oriented, cooperative, not in acute distress. PSYCH: :Pleasant, normal mood and affect. HEENT:  conjunctiva pink, mucous membranes moist, neck supple without masses. No jaundice. CARDIAC:  S1 S2 normal. No murmers. PULM: Normal respiratory effort, lungs CTA bilaterally, no wheezing. ABDOMEN: Inspection: No visible peristalsis, no abnormal pulsations, skin normal.  Palpation/percussion: Soft, nontender, nondistended, no rigidity, no abnormal dullness to percussion, no hepatosplenomegaly and no palpable abdominal masses.  Auscultation: Normal bowel sounds, no abdominal bruits. Rectal exam: Deferred SKIN:  turgor, no lesions seen. Musculoskeletal:  Normal muscle tone, normal strength. NEURO: Alert and oriented x 3, no focal neurologic deficits. . CBC Latest Ref Rng & Units 04/17/2018 11/05/2017 08/06/2017  WBC 3.4 - 10.8 x10E3/uL 4.2 4.8 4.6  Hemoglobin 11.1 - 15.9 g/dL 12.9 12.2 13.2  Hematocrit 34.0 - 46.6 % 37.1 35.2 37.4  Platelets 150 - 450 x10E3/uL 126(L) 134(L) 134(L)   CMP Latest Ref Rng & Units 08/06/2017 11/26/2015 10/30/2015  Glucose 65 - 99 mg/dL 104(H) 88 99  BUN 8 - 27 mg/dL 7(L) 11 15  Creatinine 0.57 - 1.00 mg/dL 0.60 0.57 0.60  Sodium 134 - 144 mmol/L 143 140 142  Potassium 3.5 - 5.2 mmol/L 4.2 3.8 3.7  Chloride 96 - 106 mmol/L 104  105 104  CO2 20 - 29 mmol/L 24 27 29   Calcium 8.7 - 10.3 mg/dL 10.0 9.0 9.3  Total Protein 6.0 - 8.5 g/dL 7.5 7.2 7.7  Total Bilirubin 0.0 - 1.2 mg/dL 0.9 1.1 1.5(H)  Alkaline Phos 39 - 117 IU/L 94 80 96  AST 0 - 40 IU/L 17 30 54(H)  ALT 0 - 32 IU/L 17 25 30  25  minutes spent with the patient today. Greater than 50% was spent in counseling and coordination of care with the patient     Seerat Peaden,MD 07/01/2018, 11:41 AM   CC Danford, Berna Spare, NP

## 2018-07-01 NOTE — Telephone Encounter (Signed)
[facility] Pre-operative Risk Assessment     Request for surgical clearance:     Endoscopy Procedure  What type of surgery is being performed?     Colon  When is this surgery scheduled?     07/16/18 2:30pm   What type of clearance is required ?   Pharmacy  Are there any medications that need to be held prior to surgery and how long? Plavix 5 days   Practice name and name of physician performing surgery?      [facility]          Jackquline Denmark   What is your office phone and fax number?      Phone- [phone]  [phone]  Anesthesia type (None, local, MAC, general) ?       MAC

## 2018-07-01 NOTE — Patient Instructions (Addendum)
If you are age 70 or older, your body mass index should be between 23-30. Your Body mass index is 30.15 kg/m. If this is out of the aforementioned range listed, please consider follow up with your Primary Care Provider.  If you are age 54 or younger, your body mass index should be between 19-25. Your Body mass index is 30.15 kg/m. If this is out of the aformentioned range listed, please consider follow up with your Primary Care Provider.   We have sent the following medications to your pharmacy for you to pick up at your convenience: Suprep  We have given you samples of the following medication to take: Trulance once daily.   Please go to the lab on the 2nd floor [address] before you leave the office today.   You have been scheduled for a gastric emptying scan at [facility] on 07/08/2018 at [place]. Please arrive at least 15 minutes prior to your appointment for registration. Please make certain not to have anything to eat or drink after midnight the night before your test. Hold all stomach medications (ex: Zofran, phenergan, Reglan) 48 hours prior to your test. If you need to reschedule your appointment, please contact radiology scheduling at [phone]. _____________________________________________________________________ A gastric-emptying study measures how long it takes for food to move through your stomach. There are several ways to measure stomach emptying. In the most common test, you eat food that contains a small amount of radioactive material. A scanner that detects the movement of the radioactive material is placed over your abdomen to monitor the rate at which food leaves your stomach. This test normally takes about 4 hours to complete. _____________________________________________________________________  Amy Moon have been scheduled for a colonoscopy. Please follow written instructions given to you at your visit today.  Please pick up your prep supplies at the pharmacy within  the next 1-3 days. If you use inhalers (even only as needed), please bring them with you on the day of your procedure. Your physician has requested that you go to www.startemmi.com and enter the access code given to you at your visit today. This web site gives a general overview about your procedure. However, you should still follow specific instructions given to you by our office regarding your preparation for the procedure.  Thank you,  Dr. Jackquline Denmark

## 2018-07-02 ENCOUNTER — Ambulatory Visit
Admission: RE | Admit: 2018-07-02 | Discharge: 2018-07-02 | Disposition: A | Discharge status: Home / Self Care | Payer: Medicare HMO | Source: Ambulatory Visit | Attending: Adult Health | Admitting: Adult Health

## 2018-07-02 ENCOUNTER — Encounter: Payer: Self-pay | Admitting: Gastroenterology

## 2018-07-02 ENCOUNTER — Other Ambulatory Visit: Payer: Self-pay | Admitting: Adult Health

## 2018-07-02 DIAGNOSIS — N6489 Other specified disorders of breast: Secondary | ICD-10-CM

## 2018-07-02 DIAGNOSIS — R928 Other abnormal and inconclusive findings on diagnostic imaging of breast: Secondary | ICD-10-CM

## 2018-07-02 LAB — AFP TUMOR MARKER: AFP-Tumor Marker: 4.4 ng/mL

## 2018-07-02 NOTE — Telephone Encounter (Signed)
I have called the patient and informed her to hold her Plavix for 5 days before her procedure. Patient voiced understanding.

## 2018-07-03 ENCOUNTER — Ambulatory Visit
Admission: RE | Admit: 2018-07-03 | Discharge: 2018-07-03 | Disposition: A | Discharge status: Home / Self Care | Payer: Medicare HMO | Source: Ambulatory Visit | Attending: Adult Health | Admitting: Adult Health

## 2018-07-03 DIAGNOSIS — N6489 Other specified disorders of breast: Secondary | ICD-10-CM

## 2018-07-08 ENCOUNTER — Encounter (HOSPITAL_COMMUNITY): Payer: Self-pay

## 2018-07-08 ENCOUNTER — Encounter (HOSPITAL_COMMUNITY): Payer: Medicare HMO

## 2018-07-12 ENCOUNTER — Encounter (INDEPENDENT_AMBULATORY_CARE_PROVIDER_SITE_OTHER): Payer: Self-pay | Admitting: Specialist

## 2018-07-12 ENCOUNTER — Ambulatory Visit (INDEPENDENT_AMBULATORY_CARE_PROVIDER_SITE_OTHER): Payer: Medicare HMO | Admitting: Specialist

## 2018-07-12 VITALS — BP 147/85 | HR 92 | Ht 60.0 in | Wt 154.0 lb

## 2018-07-12 DIAGNOSIS — M47812 Spondylosis without myelopathy or radiculopathy, cervical region: Secondary | ICD-10-CM

## 2018-07-12 MED ORDER — CYCLOBENZAPRINE HCL 10 MG PO TABS: 10.0000 mg | ORAL_TABLET | Freq: Three times a day (TID) | ORAL | 0 refills | Status: DC | PRN

## 2018-07-12 MED ORDER — GABAPENTIN 300 MG PO CAPS: 600.0000 mg | ORAL_CAPSULE | Freq: Every day | ORAL | 3 refills | Status: DC

## 2018-07-12 MED ORDER — GABAPENTIN 100 MG PO CAPS: ORAL_CAPSULE | ORAL | 3 refills | Status: DC

## 2018-07-12 NOTE — Patient Instructions (Signed)
Avoid overhead lifting and overhead use of the arms. Do not lift greater than 5 lbs. Adjust head rest in vehicle to prevent hyperextension if rear ended. Take extra precautions to avoid falling. I recommend having Dr. Ernestina Patches consider RFA of the facets in the cervical spine as the results of block suggest that this procedure may give long term relief of the pain you are experiencing.

## 2018-07-12 NOTE — Progress Notes (Addendum)
Office Visit Note   Patient: Amy Moon           Date of Birth: 07-20-1950           MRN: [id number] Visit Date: 07/12/2018              Requested by: [name], NP [place], [place] [address] PCP: [name], NP   Assessment & Plan: Visit Diagnoses:  1. Spondylosis without myelopathy or radiculopathy, cervical region     Plan: Avoid overhead lifting and overhead use of the arms. Do not lift greater than 5 lbs. Adjust head rest in vehicle to prevent hyperextension if rear ended. Take extra precautions to avoid falling. I recommend having Dr. Ernestina Patches consider RFA of the facets in the cervical spine as the results of block suggest that this procedure may give long term relief of the pain you are experiencing.   Follow-Up Instructions: Return in about 4 weeks (around 08/09/2018).   Orders:  Orders Placed This Encounter  Procedures  . Ambulatory referral to Physical Medicine Rehab   Meds ordered this encounter  Medications  . gabapentin (NEURONTIN) 100 MG capsule    Sig: Take one capsule q AM and q Noon.    Dispense:  180 capsule    Refill:  3  . gabapentin (NEURONTIN) 300 MG capsule    Sig: Take 2 capsules (600 mg total) by mouth at bedtime.    Dispense:  180 capsule    Refill:  3  . cyclobenzaprine (FLEXERIL) 10 MG tablet    Sig: Take 1 tablet (10 mg total) by mouth 3 (three) times daily as needed for muscle spasms.    Dispense:  30 tablet    Refill:  0      Procedures: No procedures performed   Clinical Data: No additional findings.   Subjective: Chief Complaint  Patient presents with  . Neck - Follow-up    68 year old female with history of neck pain and radiation in to the shoulder and shoulder blade with popping and cracking. She took clonapine, it helped but was stopped. The pain recurred with stopping the clonapine.  She had the injection by Dr.Newton and it helped relieve the pain but did not last more than a couple  of hours. The pain after the local anesthetic Wore off it became severe. She had good to excellent relief prior to the local anesthestic wearing off. She has pain in hands and other joints as well. Pain with ROM of the cervical spine.    Review of Systems  Constitutional: Negative.   HENT: Negative.   Eyes: Negative.   Respiratory: Negative.   Cardiovascular: Negative.   Gastrointestinal: Negative.   Endocrine: Negative.   Genitourinary: Negative.   Musculoskeletal: Negative.   Skin: Negative.   Allergic/Immunologic: Negative.   Neurological: Negative.   Hematological: Negative.   Psychiatric/Behavioral: Negative.      Objective: Vital Signs: BP (!) 147/85 (BP Location: Left Arm, Patient Position: Sitting)   Pulse 92   Ht 5' (1.524 m)   Wt 154 lb (69.9 kg)   BMI 30.08 kg/m   Physical Exam Constitutional:      Appearance: She is well-developed.  HENT:     Head: Normocephalic and atraumatic.  Eyes:     Pupils: Pupils are equal, round, and reactive to light.  Neck:     Musculoskeletal: Normal range of motion and neck supple.  Pulmonary:     Effort: Pulmonary effort  is normal.     Breath sounds: Normal breath sounds.  Abdominal:     General: Bowel sounds are normal.     Palpations: Abdomen is soft.  Skin:    General: Skin is warm and dry.  Neurological:     Mental Status: She is alert and oriented to person, place, and time.  Psychiatric:        Behavior: Behavior normal.        Thought Content: Thought content normal.        Judgment: Judgment normal.     Back Exam   Tenderness  The patient is experiencing tenderness in the cervical.  Range of Motion  Extension: abnormal  Flexion: abnormal  Lateral bend right: abnormal  Lateral bend left: abnormal  Rotation right: abnormal  Rotation left: abnormal   Muscle Strength  Right Quadriceps:  5/5  Left Quadriceps:  5/5  Right Hamstrings:  5/5  Left Hamstrings:  5/5   Tests  Straight leg raise right:  negative Straight leg raise left: negative  Reflexes  Patellar: normal Achilles: normal Biceps: normal Babinski's sign: normal   Other  Toe walk: normal Heel walk: normal Sensation: normal Gait: normal  Erythema: no back redness Scars: absent      Specialty Comments:  No specialty comments available.  Imaging: No results found.   PMFS History: Patient Active Problem List   Diagnosis Date Noted  . Bronchitis 06/03/2018  . Encounter for Medicare annual wellness exam 05/09/2018  . Vitamin D deficiency 11/05/2017  . Encounter for hepatitis C virus screening test for high risk patient 11/05/2017  . Dysphagia 09/03/2017  . Thrombocytopenia ([place]) 09/03/2017  . Healthcare maintenance 07/30/2017  . Chronic pain syndrome 07/30/2017  . Chest pain 07/30/2017  . Parkinson's disease ([place]) 07/30/2017  . Hypertension 07/30/2017  . Severe obesity (BMI >= 40) ([place]) 05/15/2016  . Severe episode of recurrent major depressive disorder, without psychotic features ([place]) 05/15/2016  . Tremor of both hands 05/15/2016  . Irritant contact dermatitis 08/25/2015   Past Medical History:  Diagnosis Date  . Anemia   . Angina pectoris ([place])   . Arthritis   . Cancer (HCC)    squamous cell carcinoma, nose  . Cataract   . Chronic kidney disease   . Clotting disorder ([place])   . Drug-induced low platelet count   . Duodenal ulcer   . GERD (gastroesophageal reflux disease)   . Granulomatous disease ([place])   . Hepatic disease   . Hiatal hernia   . Hypertension   . Low vitamin D level   . Neuropathy   . Osteopenia   . Osteoporosis   . Parkinson's disease ([place])   . Portal hypertension ([place])   . Splenomegaly a    Family History  Problem Relation Age of Onset  . Cancer Mother        ovarian  . Hyperlipidemia Mother   . Alzheimer's disease Mother   . Arthritis Mother        rheumatoid  . Cancer Father        lung  . Parkinson's disease Father   . Alzheimer's disease Father   . ALS  Sister   . Cancer Maternal Aunt        pancreatic, lung, liver,breast  . Breast cancer Maternal Aunt   . Cancer Maternal Uncle        pancreatic, liver, brain  . Heart attack Paternal Aunt   . Stroke Paternal Aunt   . Heart attack Paternal Uncle   .  Stroke Paternal Grandmother   . Heart attack Paternal Grandfather   . Arthritis Sister        rheumatoid  . Huntington's disease Daughter        presumed inherited from father per patient  . Colon cancer Maternal Uncle   . Breast cancer Maternal Aunt   . Breast cancer Maternal Aunt     Past Surgical History:  Procedure Laterality Date  . ABDOMINAL HYSTERECTOMY    . APPENDECTOMY    . BACK SURGERY    . ESOPHAGOGASTRODUODENOSCOPY  05/20/2008   Mild to moderate esophagitis. Otherwise normal EGD.   Marland Kitchen GASTROSTOMY     peptic ulcer   Social History   Occupational History  . Not on file  Tobacco Use  . Smoking status: Former Smoker    Packs/day: 1.00    Years: 22.00    Pack years: 22.00    Types: Cigarettes    Last attempt to quit: 06/26/1986    Years since quitting: 32.0  . Smokeless tobacco: Never Used  Substance and Sexual Activity  . Alcohol use: No  . Drug use: No  . Sexual activity: Not Currently    Birth control/protection: None

## 2018-07-15 ENCOUNTER — Other Ambulatory Visit: Payer: Self-pay | Admitting: Surgery

## 2018-07-15 DIAGNOSIS — N6310 Unspecified lump in the right breast, unspecified quadrant: Secondary | ICD-10-CM

## 2018-07-16 ENCOUNTER — Encounter: Payer: Medicare HMO | Admitting: Gastroenterology

## 2018-07-18 ENCOUNTER — Other Ambulatory Visit (INDEPENDENT_AMBULATORY_CARE_PROVIDER_SITE_OTHER): Payer: Self-pay | Admitting: Orthopaedic Surgery

## 2018-07-18 NOTE — Telephone Encounter (Signed)
Tramadol refill request 

## 2018-07-18 NOTE — Telephone Encounter (Signed)
Dr. Nitka patient. 

## 2018-07-25 ENCOUNTER — Encounter (HOSPITAL_COMMUNITY)
Admission: RE | Admit: 2018-07-25 | Discharge: 2018-07-25 | Disposition: A | Discharge status: Home / Self Care | Payer: Medicare HMO | Source: Ambulatory Visit | Attending: Surgery | Admitting: Surgery

## 2018-07-25 ENCOUNTER — Other Ambulatory Visit: Payer: Self-pay

## 2018-07-25 ENCOUNTER — Encounter (HOSPITAL_COMMUNITY): Payer: Self-pay

## 2018-07-25 DIAGNOSIS — Z01812 Encounter for preprocedural laboratory examination: Secondary | ICD-10-CM | POA: Diagnosis not present

## 2018-07-25 HISTORY — DX: Major depressive disorder, single episode, unspecified: F32.9

## 2018-07-25 HISTORY — DX: Personal history of urinary calculi: Z87.442

## 2018-07-25 HISTORY — DX: Unspecified cirrhosis of liver: K74.60

## 2018-07-25 HISTORY — DX: Family history of other specified conditions: Z84.89

## 2018-07-25 HISTORY — DX: Depression, unspecified: F32.A

## 2018-07-25 HISTORY — DX: Pneumonia, unspecified organism: J18.9

## 2018-07-25 HISTORY — DX: Unspecified asthma, uncomplicated: J45.909

## 2018-07-25 HISTORY — DX: Nontoxic goiter, unspecified: E04.9

## 2018-07-25 LAB — BASIC METABOLIC PANEL
Anion gap: 8 (ref 5–15)
BUN: 6 mg/dL — ABNORMAL LOW (ref 8–23)
CO2: 25 mmol/L (ref 22–32)
Calcium: 9.2 mg/dL (ref 8.9–10.3)
Chloride: 107 mmol/L (ref 98–111)
Creatinine, Ser: 0.69 mg/dL (ref 0.44–1.00)
GFR calc Af Amer: >60 mL/min (ref >60)
GFR calc non Af Amer: >60 mL/min (ref >60)
Glucose, Bld: 94 mg/dL (ref 70–99)
Potassium: 3.8 mmol/L (ref 3.5–5.1)
Sodium: 140 mmol/L (ref 135–145)

## 2018-07-25 LAB — CBC
HCT: 37.6 % (ref 36.0–46.0)
Hemoglobin: 12.5 g/dL (ref 12.0–15.0)
MCH: 33.1 pg (ref 26.0–34.0)
MCHC: 33.2 g/dL (ref 30.0–36.0)
MCV: 99.5 fL (ref 80.0–100.0)
PLATELETS: 122 10*3/uL — AB (ref 150–400)
RBC: 3.78 MIL/uL — ABNORMAL LOW (ref 3.87–5.11)
RDW: 12 % (ref 11.5–15.5)
WBC: 4.8 10*3/uL (ref 4.0–10.5)
nRBC: 0 % (ref 0.0–0.2)

## 2018-07-25 NOTE — Progress Notes (Addendum)
PCP - Mina Marble, NP Cardiologist - Dr. Agustin Cree (for angina) No CAD per pt.  Chest x-ray - n/a EKG - 10/11/17 Stress Test - 11/26/17 ECHO - 10/31/17 Cardiac Cath - n/a  Sleep Study - n/a CPAP - n/a  Fasting Blood Sugar - n/a Checks Blood Sugar _____ times a day  Blood Thinner Instructions: instructed to stop Clopidogrel 5 days prior (last dose to be 07/28/18) Aspirin Instructions: n/a  Anesthesia review:   Patient denies shortness of breath, fever, cough and chest pain at PAT appointment   Patient verbalized understanding of instructions that were given to them at the PAT appointment. Patient was also instructed that they will need to review over the PAT instructions again at home before surgery.

## 2018-07-25 NOTE — Pre-Procedure Instructions (Signed)
Amy Moon  07/25/2018    Your procedure is scheduled on Thursday, August 01, 2018 at 12:30 PM.   Report to [facility] Entrance "A" Admitting Office at 10:30 AM.   Call this number if you have problems the morning of surgery: [phone]   Questions prior to day of surgery, please call [phone] between 8 & 4 PM.   Remember:  Do not eat food after midnight Wednesday, 07/31/18.  You may drink clear liquids until 9:30 AM.  Clear liquids allowed are: Water, Juice (non-tric, no pulp), Carbonated beverages, clear Tea, black Coffee, plain Jello, plain Popsicles, Gatorade - No milk                   Take these medicines the morning of surgery with A SIP OF WATER: Gabapentin (Neurontin), Omeprazole (Prilosec), Ranolazine (Ranexa), Tramadol or Tylenol - if needed    Do not wear jewelry, make-up or nail polish.  Do not wear lotions, powders, perfumes or deodorant.  Do not shave 48 hours prior to surgery.    Do not bring valuables to the hospital.  [facility] is not responsible for any belongings or valuables.  Contacts, dentures or bridgework may not be worn into surgery.  Leave your suitcase in the car.  After surgery it may be brought to your room.  For patients admitted to the hospital, discharge time will be determined by your treatment team.  Patients discharged the day of surgery will not be allowed to drive home.   [place] - Preparing for Surgery  Before surgery, you can play an important role.  Because skin is not sterile, your skin needs to be as free of germs as possible.  You can reduce the number of germs on you skin by washing with CHG (chlorahexidine gluconate) soap before surgery.  CHG is an antiseptic cleaner which kills germs and bonds with the skin to continue killing germs even after washing.  Oral Hygiene is also important in reducing the risk of infection.  Remember to brush your teeth with your regular toothpaste the morning of  surgery.  Please DO NOT use if you have an allergy to CHG or antibacterial soaps.  If your skin becomes reddened/irritated stop using the CHG and inform your nurse when you arrive at Short Stay.  Do not shave (including legs and underarms) for at least 48 hours prior to the first CHG shower.  You may shave your face.  Please follow these instructions carefully:   1.  Shower with CHG Soap the night before surgery and the morning of Surgery.  2.  If you choose to wash your hair, wash your hair first as usual with your normal shampoo.  3.  After you shampoo, rinse your hair and body thoroughly to remove the shampoo. 4.  Use CHG as you would any other liquid soap.  You can apply chg directly to the skin and wash gently with a      scrungie or washcloth.           5.  Apply the CHG Soap to your body ONLY FROM THE NECK DOWN.   Do not use on open wounds or open sores. Avoid contact with your eyes, ears, mouth and genitals (private parts).  Wash genitals (private parts) with your normal soap.  6.  Wash thoroughly, paying special attention to the area where your surgery will be performed.  7.  Thoroughly rinse your body with warm water from the neck down.  8.  DO NOT shower/wash with your normal soap after using and rinsing off the CHG Soap.  9.  Pat yourself dry with a clean towel.            10.  Wear clean pajamas.            11.  Place clean sheets on your bed the night of your first shower and do not sleep with pets.  Day of Surgery   Shower as above. Do not apply any lotions/deodorants the morning of surgery.   Please wear clean clothes to the hospital. Remember to brush your teeth with toothpaste.   Please read over the fact sheets that you were given.

## 2018-07-26 NOTE — Progress Notes (Signed)
Anesthesia Chart Review:  Case:  [id number] Date/Time:  08/01/18 1215   Procedure:  RIGHT BREAST LUMPECTOMY WITH RADIOACTIVE SEED LOCALIZATION (Right Breast)   Anesthesia type:  General   Pre-op diagnosis:  right breast mass   Location:  [facility] ROOM 09 / [facility] OR   Surgeon:  Coralie Keens, MD      DISCUSSION: 68 yo female former smoker. 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   Pt follows with cardiology for atypical chest pain and risk factors for coronary artery disease. She is on Plavix for primary prevention due to a stated allergy to aspirin. Cardiac clearance 07/01/2018 states:  68 yo female with chest pain, HTN, GERD, cryptogenic early liver cirrhosis.  She was last seen by Dr. Agustin Cree in 02/2018.  Echo in 5/19 demonstrated normal EF.  Myoview in 6/19 was low risk.  The patient was contacted at home by phone today.  She has not had any change in her baseline symptoms since last seen.  Overall, she feels she is doing better.  She has not had any limitations to most activities.    The Revised Cardiac Risk Index indicates that her perioperative risk of major cardiac event is low at 0.4%.  Therefore, she does not require any further cardiac testing prior to her non cardiac procedure and may proceed at acceptable risk.     She was able to hold Plavix for her EGD in 02/2018.  Therefore, she may hold Plavix for 5 days prior to her procedure and resume post op when felt to be safe.  Follows with Dr. Jackquline Denmark for Cryptogenic early liver cirrhosis (Dx on [place] 02/2018, alb 4.6 2019) with borderline splenomegaly with borderline platelet count. Likely d/t NASH. No ETOH, neg Heb C. No varices on EGD 03/2018 or ascites on [place] 02/2018. She also has dysphagia that is improved after recent EGD with dilation 03/2018.  Anticipate she can proceed as planned  barring acute status change.   VS: BP (!) 146/72   Pulse 89   Temp 37.2 C   Resp 20   Ht 5' (1.524 m)   Wt 70.8 kg   SpO2 97%   BMI 30.49 kg/m   PROVIDERS: [name], NP is PCP  Jenne Campus, MD is Cardiologist  Jackquline Denmark, MD is Gastroenterologist  LABS: Labs reviewed: Acceptable for surgery. (all labs ordered are listed, but only abnormal results are displayed)  Labs Reviewed  BASIC METABOLIC PANEL - Abnormal; Notable for the following components:      Result Value   BUN 6 (*)    All other components within normal limits  CBC - Abnormal; Notable for the following components:   RBC 3.78 (*)    Platelets 122 (*)    All other components within normal limits    EKG: 10/11/2017: NSR. Rate 92. Septal infarct, age undetermined.  CV: Nuclear stress 11/26/2017:  Nuclear stress EF: 77%.  There was no ST segment deviation noted during stress.  This is a low risk study.  The left ventricular ejection fraction is hyperdynamic (>65%).   1. EF 77%, normal wall motion.  2. Fixed small, mild basal to mid inferior perfusion defect.  Given normal wall motion, this may be soft tissue attenuation.  No evidence for ischemia.   Low risk study.   Echo 10/31/2017: Study Conclusions  - Left ventricle: The cavity size was normal. Systolic function  was   normal. The estimated ejection fraction was in the range of 55%   to 60%. Wall motion was normal; there were no regional wall   motion abnormalities. Left ventricular diastolic function   parameters were normal.  Impressions:  - Normal LVEF.   Trace TR.   Past Medical History:  Diagnosis Date  . Anemia   . Angina pectoris ([place])   . Arthritis   . Asthma    years ago  . Cancer (HCC)    squamous cell carcinoma, nose  . Cataract   . Chronic kidney disease    possibly per pt  . Cirrhosis ([place])   . Clotting disorder (HCC)    platelets are low  . Depression   . Drug-induced low platelet count   .  Duodenal ulcer   . Enlarged thyroid   . Family history of adverse reaction to anesthesia    father had difficulty waking up and breathing on his own after surgery  . GERD (gastroesophageal reflux disease)   . Granulomatous disease ([place])   . Hepatic disease   . Hiatal hernia   . History of kidney stones   . Hypertension   . Low vitamin D level   . Neuropathy   . Osteopenia   . Osteoporosis   . Parkinson's disease ([place])   . Pneumonia   . Portal hypertension ([place])   . Splenomegaly a    Past Surgical History:  Procedure Laterality Date  . ABDOMINAL HYSTERECTOMY    . APPENDECTOMY    . BACK SURGERY     thoracic-fractured   . BACK SURGERY     lumbar - fractured  . ESOPHAGOGASTRODUODENOSCOPY  05/20/2008   Mild to moderate esophagitis. Otherwise normal EGD.   Marland Kitchen EYE SURGERY Left    cataract surgery with lens implant  . GASTROSTOMY     peptic ulcer - peg tube placement    MEDICATIONS: . acetaminophen (TYLENOL) 650 MG CR tablet  . albuterol (PROVENTIL HFA;VENTOLIN HFA) 108 (90 Base) MCG/ACT inhaler  . bisacodyl (DULCOLAX) 5 MG EC tablet  . clopidogrel (PLAVIX) 75 MG tablet  . cyclobenzaprine (FLEXERIL) 10 MG tablet  . diphenhydrAMINE (BENADRYL) 50 MG tablet  . gabapentin (NEURONTIN) 100 MG capsule  . gabapentin (NEURONTIN) 300 MG capsule  . nitroGLYCERIN (NITROSTAT) 0.4 MG SL tablet  . omeprazole (PRILOSEC) 20 MG capsule  . Plecanatide (TRULANCE) 3 MG TABS  . ranolazine (RANEXA) 500 MG 12 hr tablet  . Vitamin D, Ergocalciferol, (DRISDOL) 50000 units CAPS capsule   . bupivacaine (MARCAINE) 0.25 % (with pres) injection 2 mL  . methylPREDNISolone acetate (DEPO-MEDROL) injection 80 mg   Wynonia Musty [facility] Short Stay Center/Anesthesiology Phone [phone] 07/26/2018 12:43 PM

## 2018-07-30 ENCOUNTER — Ambulatory Visit
Admission: RE | Admit: 2018-07-30 | Discharge: 2018-07-30 | Disposition: A | Discharge status: Home / Self Care | Payer: Medicare HMO | Source: Ambulatory Visit | Attending: Surgery | Admitting: Surgery

## 2018-07-30 DIAGNOSIS — N6310 Unspecified lump in the right breast, unspecified quadrant: Secondary | ICD-10-CM

## 2018-07-31 NOTE — H&P (Signed)
Amy Moon Documented: 07/15/2018 9:56 AM Location: [facility] Patient #: [id number] DOB: 1951-04-22 Married / Language: English / Race: White Female   History of Present Illness ([name] MD; 07/15/2018 10:07 AM) The patient is a 68 year old female who presents with a breast mass. This is a pleasant 68 year old female referred by [name] at the recent diagnosis of a right breast mass. This was seen on screening mammography. She underwent a biopsy of the mass showing fibrocystic changes. This was felt to be discordant with the radiographic findings. She has had previous history of breast masses or breast cancer. There is a significant family history of multiple cancers including breast cancer in family members. She is otherwise without complaints. She has no nipple discharge.   Allergies [name], CMA; 07/15/2018 9:57 AM) Penicillins  NSAIDs  Adhesive Tape  Sinemet *ANTIPARKINSON AND RELATED THERAPY AGENTS*   Medication History ([name], CMA; 07/15/2018 9:58 AM) Ranolazine ER (500MG  Tablet ER 12HR, Oral) Active. Gabapentin (300MG  Capsule, Oral) Active. Vitamin D (Ergocalciferol) (50000UNIT Capsule, Oral) Active. Clopidogrel Bisulfate (75MG  Tablet, Oral) Active. traMADol HCl (50MG  Tablet, Oral) Active. Medications Reconciled  Vitals ([name] CMA; 07/15/2018 9:56 AM) 07/15/2018 9:56 AM Weight: 154 lb Height: 60.5in Body Surface Area: 1.68 m Body Mass Index: 29.58 kg/m  Pulse: 100 (Regular)  BP: 150/80 (Sitting, Left Arm, Standard)       Physical Exam ([name] A. Ninfa Linden MD; 07/15/2018 10:07 AM) General Mental Status-Alert. General Appearance-Consistent with stated age. Hydration-Well hydrated. Voice-Normal.  Head and Neck Head-normocephalic, atraumatic with no lesions or palpable masses. Trachea-midline. Thyroid Gland Characteristics - normal size and consistency.  Eye Eyeball -  Bilateral-Extraocular movements intact. Sclera/Conjunctiva - Bilateral-No scleral icterus.  Chest and Lung Exam Chest and lung exam reveals -quiet, even and easy respiratory effort with no use of accessory muscles and on auscultation, normal breath sounds, no adventitious sounds and normal vocal resonance. Inspection Chest Wall - Normal. Back - normal.  Breast Breast - Left-Symmetric, Non Tender, No Biopsy scars, no Dimpling, No Inflammation, No Lumpectomy scars, No Mastectomy scars, No Peau d' Orange. Breast - Right-Symmetric, Non Tender, No Biopsy scars, no Dimpling, No Inflammation, No Lumpectomy scars, No Mastectomy scars, No Peau d' Orange. Breast Lump-No Palpable Breast Mass.  Cardiovascular Cardiovascular examination reveals -normal heart sounds, regular rate and rhythm with no murmurs and normal pedal pulses bilaterally.  Abdomen Inspection Inspection of the abdomen reveals - No Hernias. Skin - Scar - no surgical scars. Palpation/Percussion Palpation and Percussion of the abdomen reveal - Soft, Non Tender, No Rebound tenderness, No Rigidity (guarding) and No hepatosplenomegaly. Auscultation Auscultation of the abdomen reveals - Bowel sounds normal.  Neurologic - Did not examine.  Musculoskeletal - Did not examine.  Lymphatic Head & Neck  General Head & Neck Lymphatics: Bilateral - Description - Normal. Axillary  General Axillary Region: Bilateral - Description - Normal. Tenderness - Non Tender. Femoral & Inguinal - Did not examine.    Assessment & Plan ([name] A. Ninfa Linden MD; 07/15/2018 10:08 AM) BREAST MASS, RIGHT (N63.10) Impression: This is a patient with a radiographically found right breast mass. Again, the final pathology showed only fibrocystic changes but this again was found to be discoordinate so excision of the mass is recommended for complete histologic evaluation to rule out malignancy. I discussed the reasons for this with the patient in  detail. I gave her a copy of the pathology results. We discussed proceeding with a radioactive seed guided right breast lumpectomy.  I discussed the procedure in detail. I discussed the risk which includes but is not limited to bleeding, infection, injury to surrounding structures, the need for further procedures if malignancy is found, cardiopulmonary issues, postoperative recovery, etc. She will stop her Plavix 5 days before surgery. Surgery will be scheduled

## 2018-08-01 ENCOUNTER — Ambulatory Visit (HOSPITAL_COMMUNITY)
Admission: RE | Admit: 2018-08-01 | Discharge: 2018-08-01 | Disposition: A | Discharge status: Home / Self Care | Payer: Medicare HMO | Attending: Surgery | Admitting: Surgery

## 2018-08-01 ENCOUNTER — Ambulatory Visit (HOSPITAL_COMMUNITY): Payer: Medicare HMO | Admitting: Physician Assistant

## 2018-08-01 ENCOUNTER — Encounter (HOSPITAL_COMMUNITY)
Admission: RE | Disposition: A | Discharge status: Home / Self Care | Payer: Self-pay | Source: Home / Self Care | Attending: Surgery

## 2018-08-01 ENCOUNTER — Ambulatory Visit
Admission: RE | Admit: 2018-08-01 | Discharge: 2018-08-01 | Disposition: A | Discharge status: Home / Self Care | Payer: Medicare HMO | Source: Ambulatory Visit | Attending: Surgery | Admitting: Surgery

## 2018-08-01 ENCOUNTER — Encounter (HOSPITAL_COMMUNITY): Payer: Self-pay

## 2018-08-01 ENCOUNTER — Ambulatory Visit (HOSPITAL_COMMUNITY): Payer: Medicare HMO | Admitting: Certified Registered Nurse Anesthetist

## 2018-08-01 DIAGNOSIS — Z79891 Long term (current) use of opiate analgesic: Secondary | ICD-10-CM | POA: Insufficient documentation

## 2018-08-01 DIAGNOSIS — Z7902 Long term (current) use of antithrombotics/antiplatelets: Secondary | ICD-10-CM | POA: Insufficient documentation

## 2018-08-01 DIAGNOSIS — I10 Essential (primary) hypertension: Secondary | ICD-10-CM | POA: Insufficient documentation

## 2018-08-01 DIAGNOSIS — Z888 Allergy status to other drugs, medicaments and biological substances status: Secondary | ICD-10-CM | POA: Insufficient documentation

## 2018-08-01 DIAGNOSIS — Z79899 Other long term (current) drug therapy: Secondary | ICD-10-CM | POA: Diagnosis not present

## 2018-08-01 DIAGNOSIS — N641 Fat necrosis of breast: Secondary | ICD-10-CM | POA: Insufficient documentation

## 2018-08-01 DIAGNOSIS — N6310 Unspecified lump in the right breast, unspecified quadrant: Secondary | ICD-10-CM

## 2018-08-01 DIAGNOSIS — K219 Gastro-esophageal reflux disease without esophagitis: Secondary | ICD-10-CM | POA: Insufficient documentation

## 2018-08-01 DIAGNOSIS — Z87891 Personal history of nicotine dependence: Secondary | ICD-10-CM | POA: Insufficient documentation

## 2018-08-01 DIAGNOSIS — Z880 Allergy status to penicillin: Secondary | ICD-10-CM | POA: Insufficient documentation

## 2018-08-01 DIAGNOSIS — Z886 Allergy status to analgesic agent status: Secondary | ICD-10-CM | POA: Diagnosis not present

## 2018-08-01 HISTORY — PX: BREAST LUMPECTOMY WITH RADIOACTIVE SEED LOCALIZATION: SHX6424

## 2018-08-01 SURGERY — BREAST LUMPECTOMY WITH RADIOACTIVE SEED LOCALIZATION
Anesthesia: General | Site: Breast | Laterality: Right

## 2018-08-01 MED ORDER — CHLORHEXIDINE GLUCONATE CLOTH 2 % EX PADS: 6.0000 | MEDICATED_PAD | Freq: Once | CUTANEOUS | Status: DC

## 2018-08-01 MED ORDER — ONDANSETRON HCL 4 MG/2ML IJ SOLN
INTRAMUSCULAR | Status: AC
  Filled 2018-08-01: qty 2

## 2018-08-01 MED ORDER — DEXAMETHASONE SODIUM PHOSPHATE 10 MG/ML IJ SOLN
INTRAMUSCULAR | Status: AC
  Filled 2018-08-01: qty 1

## 2018-08-01 MED ORDER — TRAMADOL HCL 50 MG PO TABS: 50.0000 mg | ORAL_TABLET | Freq: Four times a day (QID) | ORAL | 1 refills | Status: DC | PRN

## 2018-08-01 MED ORDER — MIDAZOLAM HCL 5 MG/5ML IJ SOLN
INTRAMUSCULAR | Status: DC | PRN
  Administered 2018-08-01 (×2): 1 mg via INTRAVENOUS

## 2018-08-01 MED ORDER — OXYCODONE HCL 5 MG PO TABS
ORAL_TABLET | ORAL | Status: AC
  Filled 2018-08-01: qty 1

## 2018-08-01 MED ORDER — DEXAMETHASONE SODIUM PHOSPHATE 10 MG/ML IJ SOLN
INTRAMUSCULAR | Status: DC | PRN
  Administered 2018-08-01: 10 mg via INTRAVENOUS

## 2018-08-01 MED ORDER — PROPOFOL 10 MG/ML IV BOLUS
INTRAVENOUS | Status: DC | PRN
  Administered 2018-08-01: 150 mg via INTRAVENOUS

## 2018-08-01 MED ORDER — OXYCODONE HCL 5 MG/5ML PO SOLN: 5.0000 mg | Freq: Once | ORAL | Status: AC | PRN

## 2018-08-01 MED ORDER — GABAPENTIN 300 MG PO CAPS
300.0000 mg | ORAL_CAPSULE | ORAL | Status: AC
  Administered 2018-08-01: 300 mg via ORAL
  Filled 2018-08-01: qty 1

## 2018-08-01 MED ORDER — ONDANSETRON HCL 4 MG/2ML IJ SOLN: 4.0000 mg | Freq: Once | INTRAMUSCULAR | Status: DC | PRN

## 2018-08-01 MED ORDER — ONDANSETRON HCL 4 MG/2ML IJ SOLN
INTRAMUSCULAR | Status: DC | PRN
  Administered 2018-08-01: 4 mg via INTRAVENOUS

## 2018-08-01 MED ORDER — LIDOCAINE 2% (20 MG/ML) 5 ML SYRINGE
INTRAMUSCULAR | Status: AC
  Filled 2018-08-01: qty 5

## 2018-08-01 MED ORDER — 0.9 % SODIUM CHLORIDE (POUR BTL) OPTIME
TOPICAL | Status: DC | PRN
  Administered 2018-08-01: 1000 mL

## 2018-08-01 MED ORDER — FENTANYL CITRATE (PF) 100 MCG/2ML IJ SOLN
INTRAMUSCULAR | Status: DC | PRN
  Administered 2018-08-01: 25 ug via INTRAVENOUS

## 2018-08-01 MED ORDER — BUPIVACAINE-EPINEPHRINE 0.5% -1:200000 IJ SOLN
INTRAMUSCULAR | Status: AC
  Filled 2018-08-01: qty 1

## 2018-08-01 MED ORDER — ACETAMINOPHEN 500 MG PO TABS
1000.0000 mg | ORAL_TABLET | ORAL | Status: AC
  Administered 2018-08-01: 1000 mg via ORAL
  Filled 2018-08-01: qty 2

## 2018-08-01 MED ORDER — OXYCODONE HCL 5 MG PO TABS
5.0000 mg | ORAL_TABLET | Freq: Once | ORAL | Status: AC | PRN
  Administered 2018-08-01: 5 mg via ORAL

## 2018-08-01 MED ORDER — MIDAZOLAM HCL 2 MG/2ML IJ SOLN
INTRAMUSCULAR | Status: AC
  Filled 2018-08-01: qty 2

## 2018-08-01 MED ORDER — FENTANYL CITRATE (PF) 250 MCG/5ML IJ SOLN
INTRAMUSCULAR | Status: AC
  Filled 2018-08-01: qty 5

## 2018-08-01 MED ORDER — PROPOFOL 10 MG/ML IV BOLUS
INTRAVENOUS | Status: AC
  Filled 2018-08-01: qty 20

## 2018-08-01 MED ORDER — CIPROFLOXACIN IN D5W 400 MG/200ML IV SOLN
400.0000 mg | INTRAVENOUS | Status: AC
  Administered 2018-08-01: 400 mg via INTRAVENOUS
  Filled 2018-08-01: qty 200

## 2018-08-01 MED ORDER — BUPIVACAINE-EPINEPHRINE 0.5% -1:200000 IJ SOLN
INTRAMUSCULAR | Status: DC | PRN
  Administered 2018-08-01: 18 mL

## 2018-08-01 MED ORDER — FENTANYL CITRATE (PF) 100 MCG/2ML IJ SOLN
25.0000 ug | INTRAMUSCULAR | Status: DC | PRN
  Administered 2018-08-01: 25 ug via INTRAVENOUS

## 2018-08-01 MED ORDER — LIDOCAINE 2% (20 MG/ML) 5 ML SYRINGE
INTRAMUSCULAR | Status: DC | PRN
  Administered 2018-08-01: 80 mg via INTRAVENOUS

## 2018-08-01 MED ORDER — LACTATED RINGERS IV SOLN
INTRAVENOUS | Status: DC
  Administered 2018-08-01: 09:00:00 via INTRAVENOUS

## 2018-08-01 MED ORDER — FENTANYL CITRATE (PF) 100 MCG/2ML IJ SOLN
INTRAMUSCULAR | Status: AC
  Filled 2018-08-01: qty 2

## 2018-08-01 SURGICAL SUPPLY — 31 items
APPLIER CLIP 9.375 MED OPEN (MISCELLANEOUS) ×3
BINDER BREAST LRG (GAUZE/BANDAGES/DRESSINGS) IMPLANT
BINDER BREAST XLRG (GAUZE/BANDAGES/DRESSINGS) IMPLANT
CANISTER SUCT 3000ML PPV (MISCELLANEOUS) ×3 IMPLANT
CHLORAPREP W/TINT 26ML (MISCELLANEOUS) ×3 IMPLANT
CLIP APPLIE 9.375 MED OPEN (MISCELLANEOUS) ×1 IMPLANT
COVER PROBE W GEL 5X96 (DRAPES) ×3 IMPLANT
COVER SURGICAL LIGHT HANDLE (MISCELLANEOUS) ×3 IMPLANT
COVER WAND RF STERILE (DRAPES) ×3 IMPLANT
DERMABOND ADVANCED (GAUZE/BANDAGES/DRESSINGS) ×2
DERMABOND ADVANCED .7 DNX12 (GAUZE/BANDAGES/DRESSINGS) ×1 IMPLANT
DEVICE DUBIN SPECIMEN MAMMOGRA (MISCELLANEOUS) ×3 IMPLANT
DRAPE CHEST BREAST 15X10 FENES (DRAPES) ×3 IMPLANT
ELECT CAUTERY BLADE 6.4 (BLADE) ×3 IMPLANT
ELECT REM PT RETURN 9FT ADLT (ELECTROSURGICAL) ×3
ELECTRODE REM PT RTRN 9FT ADLT (ELECTROSURGICAL) ×1 IMPLANT
GLOVE SURG SIGNA 7.5 PF LTX (GLOVE) ×3 IMPLANT
GOWN STRL REUS W/ TWL LRG LVL3 (GOWN DISPOSABLE) ×1 IMPLANT
GOWN STRL REUS W/ TWL XL LVL3 (GOWN DISPOSABLE) ×1 IMPLANT
GOWN STRL REUS W/TWL LRG LVL3 (GOWN DISPOSABLE) ×2
GOWN STRL REUS W/TWL XL LVL3 (GOWN DISPOSABLE) ×2
KIT BASIN OR (CUSTOM PROCEDURE TRAY) ×3 IMPLANT
KIT MARKER MARGIN INK (KITS) ×3 IMPLANT
NEEDLE HYPO 25GX1X1/2 BEV (NEEDLE) ×3 IMPLANT
NS IRRIG 1000ML POUR BTL (IV SOLUTION) IMPLANT
PACK GENERAL/GYN (CUSTOM PROCEDURE TRAY) ×3 IMPLANT
SUT MNCRL AB 4-0 PS2 18 (SUTURE) ×3 IMPLANT
SUT VIC AB 3-0 SH 18 (SUTURE) ×3 IMPLANT
SYR CONTROL 10ML LL (SYRINGE) ×3 IMPLANT
TOWEL OR 17X24 6PK STRL BLUE (TOWEL DISPOSABLE) ×3 IMPLANT
TOWEL OR 17X26 10 PK STRL BLUE (TOWEL DISPOSABLE) ×3 IMPLANT

## 2018-08-01 NOTE — Op Note (Signed)
RADIOACTIVE SEED GUIDED RIGHT BREAST LUMPECTOMY  Procedure Note  Amy Moon 08/01/2018   Pre-op Diagnosis: RIGHT BREAST MASS     Post-op Diagnosis: same  Procedure(s): RADIOACTIVE SEED GUIDED RIGHT BREAST LUMPECTOMY  Surgeon(s): Coralie Keens, MD  Anesthesia: General  Staff:  Circulator: Paulette Blanch, RN Scrub Person: Dollene Cleveland T  Estimated Blood Loss: Minimal               Specimens: sent to path  Indications: This is a 68 year old female with an abnormality in the right breast seen on recent screening mammography.  Stereotactic biopsy performed showing fibrocystic changes.  This was found to be discordant by the radiologist so surgical excision of this area is been recommended for complete histologic evaluation given her family history of breast cancer.  Procedure: The patient was brought to the operating room and identifies correct patient.  She is placed upon the operating room table and general anesthesia was induced.  Her right breast was then prepped and draped in usual sterile fashion.  I located the radioactive seed in the lateral right breast with the neoprobe.  I anesthetized the edge of the areola with Marcaine.  I made incision with a scalpel.  I then using the neoprobe tunneled laterally toward the radioactive seed.  The seed and previous marker were known to be slightly posterior to the abnormality.  I performed a wide lumpectomy taking all the anterior tissue over the seed and marker with a neoprobe and then completed the lumpectomy.  Once the specimen was removed, I marked all margins with marker paint, and then x-rayed the specimen.  X-ray confirmed that the previous marker and seed were in the specimen along with what appeared to be the abnormality.  This was then sent to pathology for evaluation.  I achieved hemostasis with the cautery.  I anesthetized the incision further with Marcaine.  I then closed the incision with interrupted 3-0 Vicryl sutures and  a running 4-0 Monocryl suture.  Dermabond and a binder were then applied.  The patient tolerated procedure well.  All the counts were correct at the end of the procedure.  The patient was then extubated in the operating room and taken in a stable condition to the recovery room.          Coralie Keens   Date: 08/01/2018  Time: 11:49 AM

## 2018-08-01 NOTE — Discharge Instructions (Signed)
[facility] Office Phone Number [phone]  BREAST BIOPSY/ PARTIAL MASTECTOMY: POST OP INSTRUCTIONS  Always review your discharge instruction sheet given to you by the facility where your surgery was performed.  IF YOU HAVE DISABILITY OR FAMILY LEAVE FORMS, YOU MUST BRING THEM TO THE OFFICE FOR PROCESSING.  DO NOT GIVE THEM TO YOUR DOCTOR.  1. A prescription for pain medication may be given to you upon discharge.  Take your pain medication as prescribed, if needed.  If narcotic pain medicine is not needed, then you may take acetaminophen (Tylenol) or ibuprofen (Advil) as needed. 2. Take your usually prescribed medications unless otherwise directed 3. If you need a refill on your pain medication, please contact your pharmacy.  They will contact our office to request authorization.  Prescriptions will not be filled after 5pm or on week-ends. 4. You should eat very light the first 24 hours after surgery, such as soup, crackers, pudding, etc.  Resume your normal diet the day after surgery. 5. Most patients will experience some swelling and bruising in the breast.  Ice packs and a good support bra will help.  Swelling and bruising can take several days to resolve.  6. It is common to experience some constipation if taking pain medication after surgery.  Increasing fluid intake and taking a stool softener will usually help or prevent this problem from occurring.  A mild laxative (Milk of Magnesia or Miralax) should be taken according to package directions if there are no bowel movements after 48 hours. 7. Unless discharge instructions indicate otherwise, you may remove your bandages 24-48 hours after surgery, and you may shower at that time.  You may have steri-strips (small skin tapes) in place directly over the incision.  These strips should be left on the skin for 7-10 days.  If your surgeon used skin glue on the incision, you may shower in 24 hours.  The glue will flake off over the  next 2-3 weeks.  Any sutures or staples will be removed at the office during your follow-up visit. 8. ACTIVITIES:  You may resume regular daily activities (gradually increasing) beginning the next day.  Wearing a good support bra or sports bra minimizes pain and swelling.  You may have sexual intercourse when it is comfortable. a. You may drive when you no longer are taking prescription pain medication, you can comfortably wear a seatbelt, and you can safely maneuver your car and apply brakes. b. RETURN TO WORK:  ______________________________________________________________________________________ 9. You should see your doctor in the office for a follow-up appointment approximately two weeks after your surgery.  Your doctors nurse will typically make your follow-up appointment when she calls you with your pathology report.  Expect your pathology report 2-3 business days after your surgery.  You may call to check if you do not hear from [place] after three days. 10. OTHER INSTRUCTIONS:OK TO SHOWER STARTING TOMORROW 11. WEAR BINDER ONLY FOR COMFORT 12. ICE PACK, TYLENOL, IBUPROFEN ALSO FOR PAIN _______________________________________________________________________________________________ _____________________________________________________________________________________________________________________________________ _____________________________________________________________________________________________________________________________________ _____________________________________________________________________________________________________________________________________  WHEN TO CALL YOUR DOCTOR: 1. Fever over 101.0 2. Nausea and/or vomiting. 3. Extreme swelling or bruising. 4. Continued bleeding from incision. 5. Increased pain, redness, or drainage from the incision.  The clinic staff is available to answer your questions during regular business hours.  Please dont hesitate to call and  ask to speak to one of the nurses for clinical concerns.  If you have a medical emergency, go to the nearest emergency room or call 911.  A surgeon from  [facility] is always on call at the hospital.  For further questions, please visit [email or web address]

## 2018-08-01 NOTE — Anesthesia Procedure Notes (Signed)
Procedure Name: LMA Insertion Date/Time: 08/01/2018 11:16 AM Performed by: Genelle Bal, CRNA Pre-anesthesia Checklist: Patient identified, Emergency Drugs available, Suction available and Patient being monitored Patient Re-evaluated:Patient Re-evaluated prior to induction Oxygen Delivery Method: Circle system utilized Preoxygenation: Pre-oxygenation with 100% oxygen Induction Type: IV induction Ventilation: Mask ventilation without difficulty LMA: LMA inserted LMA Size: 4.0 Number of attempts: 1 Airway Equipment and Method: Bite block Placement Confirmation: positive ETCO2 Tube secured with: Tape Dental Injury: Teeth and Oropharynx as per pre-operative assessment

## 2018-08-01 NOTE — Interval H&P Note (Signed)
History and Physical Interval Note:no change in H and P  08/01/2018 10:35 AM  Amy Moon  has presented today for surgery, with the diagnosis of RIGHT BREAST MASS  The various methods of treatment have been discussed with the patient and family. After consideration of risks, benefits and other options for treatment, the patient has consented to  Procedure(s): RIGHT BREAST LUMPECTOMY WITH RADIOACTIVE SEED LOCALIZATION (Right) as a surgical intervention .  The patient's history has been reviewed, patient examined, no change in status, stable for surgery.  I have reviewed the patient's chart and labs.  Questions were answered to the patient's satisfaction.     Coralie Keens

## 2018-08-01 NOTE — Anesthesia Postprocedure Evaluation (Signed)
Anesthesia Post Note  Patient: Amy Moon  Procedure(s) Performed: RADIOACTIVE SEED GUIDED RIGHT BREAST LUMPECTOMY (Right Breast)     Patient location during evaluation: PACU Anesthesia Type: General Level of consciousness: awake and alert Pain management: pain level controlled Vital Signs Assessment: post-procedure vital signs reviewed and stable Respiratory status: spontaneous breathing, nonlabored ventilation and respiratory function stable Cardiovascular status: blood pressure returned to baseline and stable Postop Assessment: no apparent nausea or vomiting Anesthetic complications: no    Last Vitals:  Vitals:   08/01/18 1215 08/01/18 1222  BP: 123/63 121/69  Pulse:  82  Resp:    Temp:    SpO2:  97%    Last Pain:  Vitals:   08/01/18 0911  TempSrc:   PainSc: 0-No pain                 Lidia Collum

## 2018-08-01 NOTE — Transfer of Care (Signed)
Immediate Anesthesia Transfer of Care Note  Patient: Amy Moon  Procedure(s) Performed: RADIOACTIVE SEED GUIDED RIGHT BREAST LUMPECTOMY (Right Breast)  Patient Location: PACU  Anesthesia Type:General  Level of Consciousness: awake, alert  and oriented  Airway & Oxygen Therapy: Patient Spontanous Breathing  Post-op Assessment: Report given to RN and Post -op Vital signs reviewed and stable  Post vital signs: Reviewed and stable  Last Vitals:  Vitals Value Taken Time  BP 141/82 08/01/2018 11:55 AM  Temp    Pulse 90 08/01/2018 11:56 AM  Resp 14 08/01/2018 11:56 AM  SpO2 100 % 08/01/2018 11:56 AM  Vitals shown include unvalidated device data.  Last Pain:  Vitals:   08/01/18 0911  TempSrc:   PainSc: 0-No pain      Patients Stated Pain Goal: 3 (81/85/63 1497)  Complications: No apparent anesthesia complications

## 2018-08-01 NOTE — Anesthesia Preprocedure Evaluation (Addendum)
Anesthesia Evaluation  Patient identified by MRN, date of birth, ID band Patient awake    Reviewed: Allergy & Precautions, NPO status , Patient's Chart, lab work & pertinent test results  History of Anesthesia Complications Negative for: history of anesthetic complications  Airway Mallampati: III  TM Distance: >3 FB Neck ROM: Full    Dental  (+) Upper Dentures, Partial Lower   Pulmonary neg pulmonary ROS, former smoker,    Pulmonary exam normal        Cardiovascular hypertension, Normal cardiovascular exam     Neuro/Psych Parkinson's negative psych ROS   GI/Hepatic hiatal hernia, PUD, GERD  ,(+) Cirrhosis       ,   Endo/Other  negative endocrine ROS  Renal/GU negative Renal ROS  negative genitourinary   Musculoskeletal negative musculoskeletal ROS (+)   Abdominal   Peds  Hematology negative hematology ROS (+)   Anesthesia Other Findings 68 yo F for right lumpectomy - Parkinson's disease, HTN, GERD/PUD/HH, NASH cirrhosis w/ thrombocytopenia (plts 122) - From Cardiology: "68 yo female withchest pain, HTN, GERD, cryptogenic early liver cirrhosis. She was last seen by Dr. Agustin Cree in 02/2018. Echo in 5/19 demonstrated normal EF. Myoview in 6/19 was low risk. The patient was contacted at home by phone today. She has not had any change in her baseline symptoms since last seen. Overall, she feels she is doing better. She has not had any limitations to most activities. The Revised Cardiac Risk Index indicates thather perioperative risk of major cardiac event is low at 0.4%. Therefore, she does not require any further cardiac testing prior to her non cardiac procedure and may proceed at acceptable risk."  Reproductive/Obstetrics                            Anesthesia Physical Anesthesia Plan  ASA: III  Anesthesia Plan: General   Post-op Pain Management:    Induction:  Intravenous  PONV Risk Score and Plan: 3 and Ondansetron, Dexamethasone and Treatment may vary due to age or medical condition  Airway Management Planned: LMA and Oral ETT  Additional Equipment: None  Intra-op Plan:   Post-operative Plan: Extubation in OR  Informed Consent: I have reviewed the patients History and Physical, chart, labs and discussed the procedure including the risks, benefits and alternatives for the proposed anesthesia with the patient or authorized representative who has indicated his/her understanding and acceptance.     Dental advisory given  Plan Discussed with:   Anesthesia Plan Comments:        Anesthesia Quick Evaluation

## 2018-08-02 ENCOUNTER — Other Ambulatory Visit (INDEPENDENT_AMBULATORY_CARE_PROVIDER_SITE_OTHER): Payer: Self-pay | Admitting: Specialist

## 2018-08-02 ENCOUNTER — Telehealth (INDEPENDENT_AMBULATORY_CARE_PROVIDER_SITE_OTHER): Payer: Self-pay | Admitting: Radiology

## 2018-08-02 ENCOUNTER — Telehealth: Payer: Self-pay | Admitting: Gastroenterology

## 2018-08-02 ENCOUNTER — Encounter (HOSPITAL_COMMUNITY): Payer: Self-pay | Admitting: Surgery

## 2018-08-02 NOTE — Telephone Encounter (Signed)
Patient left voicemail wanted to let someone know she had surgery and that's why she missed an appointment.  I did find a referral placed on 07/22/18 but did not see a missed appointment.  Call back # [phone]

## 2018-08-02 NOTE — Telephone Encounter (Signed)
PT resch her colon for 09-23-18 @ 11:00am and advised that she needs the suprep and instructions for the colon.  PT states that the med TRULANCE was given to her from Dr. Lyndel Safe but was not working very well and is now taking bisacavyl 5mg  and would like to know if that is okay.Amy Moon

## 2018-08-02 NOTE — Telephone Encounter (Signed)
Spoke with patient and she states at this time she will not be able to lay on her stomach due to recent surgery on her breast and would like to have RFA scheduled far out as possible once insurance approves pa.

## 2018-08-05 NOTE — Telephone Encounter (Signed)
Cyclobenzaprine refill request 

## 2018-08-05 NOTE — Telephone Encounter (Signed)
Please advise 

## 2018-08-06 ENCOUNTER — Other Ambulatory Visit: Payer: Self-pay | Admitting: Surgery

## 2018-08-08 ENCOUNTER — Ambulatory Visit: Payer: Medicare HMO | Admitting: Adult Health

## 2018-08-12 NOTE — Telephone Encounter (Signed)
Instructed patient, patient voiced understanding.

## 2018-08-12 NOTE — Telephone Encounter (Signed)
Attempted to call the patient, she has a voicemail that has not been setup. I will try to call her back.

## 2018-08-12 NOTE — Telephone Encounter (Signed)
Is fine for now We will figure out after the colonoscopy

## 2018-08-13 ENCOUNTER — Ambulatory Visit: Payer: Medicare HMO | Admitting: Cardiology

## 2018-08-22 ENCOUNTER — Other Ambulatory Visit (INDEPENDENT_AMBULATORY_CARE_PROVIDER_SITE_OTHER): Payer: Self-pay | Admitting: Specialist

## 2018-08-22 NOTE — Telephone Encounter (Signed)
Cyclobenzaprine refill request 

## 2018-08-27 ENCOUNTER — Other Ambulatory Visit: Payer: Self-pay | Admitting: Gastroenterology

## 2018-09-09 ENCOUNTER — Other Ambulatory Visit (INDEPENDENT_AMBULATORY_CARE_PROVIDER_SITE_OTHER): Payer: Self-pay | Admitting: Physical Medicine and Rehabilitation

## 2018-09-09 ENCOUNTER — Telehealth (INDEPENDENT_AMBULATORY_CARE_PROVIDER_SITE_OTHER): Payer: Self-pay | Admitting: Physical Medicine and Rehabilitation

## 2018-09-09 MED ORDER — CYCLOBENZAPRINE HCL 10 MG PO TABS: ORAL_TABLET | ORAL | 0 refills | Status: DC

## 2018-09-09 MED ORDER — TRAMADOL HCL 50 MG PO TABS: 50.0000 mg | ORAL_TABLET | Freq: Four times a day (QID) | ORAL | 1 refills | Status: DC | PRN

## 2018-09-09 NOTE — Telephone Encounter (Signed)
Done

## 2018-09-10 ENCOUNTER — Encounter (INDEPENDENT_AMBULATORY_CARE_PROVIDER_SITE_OTHER): Payer: Self-pay | Admitting: Physical Medicine and Rehabilitation

## 2018-09-16 ENCOUNTER — Encounter (INDEPENDENT_AMBULATORY_CARE_PROVIDER_SITE_OTHER): Payer: Self-pay | Admitting: Physical Medicine and Rehabilitation

## 2018-09-23 ENCOUNTER — Encounter: Payer: Medicare HMO | Admitting: Gastroenterology

## 2018-09-27 ENCOUNTER — Other Ambulatory Visit (INDEPENDENT_AMBULATORY_CARE_PROVIDER_SITE_OTHER): Payer: Self-pay | Admitting: Physical Medicine and Rehabilitation

## 2018-09-27 NOTE — Telephone Encounter (Signed)
Please advise 

## 2018-10-07 ENCOUNTER — Encounter (INDEPENDENT_AMBULATORY_CARE_PROVIDER_SITE_OTHER): Payer: Self-pay | Admitting: Physical Medicine and Rehabilitation

## 2018-10-14 ENCOUNTER — Encounter (INDEPENDENT_AMBULATORY_CARE_PROVIDER_SITE_OTHER): Payer: Self-pay | Admitting: Physical Medicine and Rehabilitation

## 2018-10-17 ENCOUNTER — Encounter (INDEPENDENT_AMBULATORY_CARE_PROVIDER_SITE_OTHER): Payer: Self-pay | Admitting: Physical Medicine and Rehabilitation

## 2018-10-21 ENCOUNTER — Other Ambulatory Visit (INDEPENDENT_AMBULATORY_CARE_PROVIDER_SITE_OTHER): Payer: Self-pay | Admitting: Physical Medicine and Rehabilitation

## 2018-10-22 NOTE — Telephone Encounter (Signed)
Please advise 

## 2018-10-24 ENCOUNTER — Encounter (INDEPENDENT_AMBULATORY_CARE_PROVIDER_SITE_OTHER): Payer: Self-pay | Admitting: Physical Medicine and Rehabilitation

## 2018-11-11 ENCOUNTER — Encounter: Payer: Self-pay | Admitting: Physical Medicine and Rehabilitation

## 2018-11-15 ENCOUNTER — Other Ambulatory Visit (INDEPENDENT_AMBULATORY_CARE_PROVIDER_SITE_OTHER): Payer: Self-pay | Admitting: Physical Medicine and Rehabilitation

## 2018-11-19 NOTE — Telephone Encounter (Signed)
Please advise 

## 2018-11-20 ENCOUNTER — Ambulatory Visit (INDEPENDENT_AMBULATORY_CARE_PROVIDER_SITE_OTHER): Payer: Medicare HMO | Admitting: Physical Medicine and Rehabilitation

## 2018-11-20 ENCOUNTER — Ambulatory Visit: Payer: Self-pay

## 2018-11-20 ENCOUNTER — Encounter: Payer: Self-pay | Admitting: Physical Medicine and Rehabilitation

## 2018-11-20 ENCOUNTER — Other Ambulatory Visit (INDEPENDENT_AMBULATORY_CARE_PROVIDER_SITE_OTHER): Payer: Self-pay | Admitting: Physical Medicine and Rehabilitation

## 2018-11-20 ENCOUNTER — Other Ambulatory Visit: Payer: Self-pay

## 2018-11-20 VITALS — BP 148/69 | HR 98

## 2018-11-20 DIAGNOSIS — M47812 Spondylosis without myelopathy or radiculopathy, cervical region: Secondary | ICD-10-CM | POA: Diagnosis not present

## 2018-11-20 DIAGNOSIS — M542 Cervicalgia: Secondary | ICD-10-CM

## 2018-11-20 MED ORDER — CYCLOBENZAPRINE HCL 10 MG PO TABS: ORAL_TABLET | ORAL | 0 refills | Status: DC

## 2018-11-20 MED ORDER — METHYLPREDNISOLONE ACETATE 80 MG/ML IJ SUSP
80.0000 mg | Freq: Once | INTRAMUSCULAR | Status: AC
  Administered 2018-11-20: 80 mg

## 2018-11-20 NOTE — Progress Notes (Signed)
.     Numeric Pain Rating Scale and Functional Assessment Average Pain 10   In the last MONTH (on 0-10 scale) has pain interfered with the following?  1. General activity like being  able to carry out your everyday physical activities such as walking, climbing stairs, carrying groceries, or moving a chair?  Rating(7)   +Driver, -BT, -Dye Allergies.  

## 2018-11-21 NOTE — Progress Notes (Signed)
Amy Moon - 68 y.o. female MRN [id number]  Date of birth: [birth year]  Office Visit Note: Visit Date: 11/20/2018 PCP: [name], NP Referred by: [name], NP  Subjective: Chief Complaint  Patient presents with  . Neck - Follow-up   HPI: Amy Moon is a 68 y.o. female who comes in today For planned radiofrequency ablation of the left C2-3 and C3-4 facet joints for worsening severe axial left more than right neck pain with some referral to the upper shoulder region.  She is failed conservative care with physical therapy and medication management.  She has a lot of intolerances to medications.  She is followed by Dr. Basil Dess from a spine surgery standpoint and his notes can be reviewed as well.  We have completed double diagnostic medial branch blocks with good relief and those are documented on pain diary.  She got more than 67% relief.  We are going to complete ablation of the left C2-3 and C3-4 facet joints.  We will consider right side depending on how she does.  She has no radicular complaints no paresthesia.  She does take Plavix which we did go ahead today and maintain her on that from a safety standpoint.  She currently takes tramadol and gabapentin as well as cyclobenzaprine.  We are going to refill the cyclobenzaprine today at her request.  ROS Otherwise per HPI.  Assessment & Plan: Visit Diagnoses:  1. Cervical spondylosis without myelopathy   2. Cervicalgia     Plan: No additional findings.   Meds & Orders:  Meds ordered this encounter  Medications  . methylPREDNISolone acetate (DEPO-MEDROL) injection 80 mg  . cyclobenzaprine (FLEXERIL) 10 MG tablet    Sig: Take 1 by mouth every 8 hrs as needed    Dispense:  30 tablet    Refill:  0    Orders Placed This Encounter  Procedures  . Radiofrequency,Cervical  . XR C-ARM NO REPORT    Follow-up: Return if symptoms worsen or fail to improve.   Procedures: No procedures performed  Cervical Facet Nerve  Denervation  Patient: Amy Moon      Date of Birth: 23-Jul-1950 MRN: [id number] PCP: [name], NP      Visit Date: 11/20/2018   Universal Protocol:    Date/Time: 11/20/2008:55 AM  Consent Given By: the patient  Position: Side Lying  Additional Comments: Vital signs were monitored before and after the procedure. Patient was prepped and draped in the usual sterile fashion. The correct patient, procedure, and site was verified.   Injection Procedure Details:  Procedure Site One Meds Administered:  Meds ordered this encounter  Medications  . methylPREDNISolone acetate (DEPO-MEDROL) injection 80 mg  . cyclobenzaprine (FLEXERIL) 10 MG tablet    Sig: Take 1 by mouth every 8 hrs as needed    Dispense:  30 tablet    Refill:  0     Laterality: Left  Location/Site:  C2-3 C3-4  Needle size: 20 G  Needle type: RF cannula, 5 mm active tip  Findings:  -Comments:   Procedure Details: The fluoroscope beam was positioned to square off the endplates of the desired vertebral level to achieve a true LATERAL position with care to align facet joints. For each target described below the skin was anesthetized with 1 ml of 1% Lidocaine without epinephrine.  To denervate the facet joint nerve to C2 (third occipital nerve), the cannula was advanced under fluoroscopic guidance and positioned over the inferior lateral  portion of the C2/3 facet joint nerve where the third occipital nerve lies.  A minimum of three lesions were made along the location of the nerve.  To denervate the facet joint nerves from C3 through C7, the lateral masses of these respective levels were localized under fluoroscopic visualization.  An outer 18 gauge, 37mm active tip cannula was inserted with an initial target area a few centimeters dorsal to the main target area which is the center point of the trapezoidal area of the articular pillar.  The cannula was then positioned using biplanar imaging to touchdown had  an oblique angle into the center of this trapezoidal area.  To denervate the C8 facet joint nerve, the cannula was fluoroscopically introduced onto the Tl transverse process at its most medial superior end.  For all of these levels, AP and lateral images were used to confirm location.  The radiofrequency probe was inserted into the cannula and stimulation was carried out at both sensory and motor levels to make sure there was expected stimulation without a radicular pattern. Subsequently, this was removed and then 0.5 to 1 ml. of 1% Lidocaine was injected. The radiofrequency probe was re-inserted and denervation of the facet nerves (medial branches of the dorsal rami innervating the facet joints) was carried out at 80-85 degrees Celsius for 60 seconds. Then the cannulas were repositioned to get one additional lesion at each level (total of two) for better efficacy. The above procedure was repeated for each facet joint nerve mentioned above. Radiographs were obtained at each level (unless otherwise noted) to verify probe placement during the neurotomy.   Additional Comments:  The patient tolerated the procedure well No complications occurred Dressing: Band-Aid    Post-procedure details: Patient was observed during the procedure. Post-procedure instructions were reviewed. Patient left the clinic in stable condition.      Clinical History: MRI CERVICAL SPINE WITHOUT CONTRAST  TECHNIQUE: Multiplanar, multisequence MR imaging of the cervical spine was performed. No intravenous contrast was administered.  COMPARISON:  Cervical spine radiographs 10/03/2017.  FINDINGS: Alignment: Improved cervical lordosis compared to the radiographs last month. Mild straightening of lordosis. No spondylolisthesis.  Vertebrae: Heterogeneous endplate marrow signal appears to be degenerative in nature throughout much of the cervical spine. There are areas of mild endplate marrow edema which appears  degenerative (posteriorly at C3-C4). Furthermore, there is confluent marrow edema in the left C2-C3 and right C3-C4 facets (series 4, image 1 on the right). These facets are chronically degenerated.  No other acute osseous abnormality identified.  Cord: Spinal cord signal is within normal limits at all visualized levels.  Posterior Fossa, vertebral arteries, paraspinal tissues: Negative visible brain parenchyma. Cervicomedullary junction is within normal limits. Preserved major vascular flow voids in the neck. Several small T2 hyperintense thyroid nodules measure up to 11 mm and do not meet size criteria for ultrasound follow-up. Negative visible lung apices.  Disc levels:  C2-C3: Broad-based mild disc bulging. Moderate facet hypertrophy greater on the left, which corresponds to the site of facet marrow edema. Mild to moderate ligament flavum hypertrophy. Mild spinal stenosis. Mild cord mass effect. Mild left C3 foraminal stenosis.  C3-C4: Disc space loss with circumferential disc osteophyte complex. Broad-based posterior component. Moderate facet and ligament flavum hypertrophy greater on the right, where there is associated degenerative marrow edema. Spinal stenosis with mild cord mass effect. Moderate to severe bilateral C4 foraminal stenosis greater on the right.  C4-C5: Circumferential disc bulge and endplate spurring with broad-based posterior and biforaminal involvement. Mild  facet and ligament flavum hypertrophy. Mild spinal stenosis with no cord mass effect. Moderate bilateral C5 foraminal stenosis.  C5-C6: Circumferential disc bulge with mild endplate spurring. Broad-based left foraminal involvement. Mild facet hypertrophy greater on the left. No spinal stenosis. Mild to moderate left and mild right C6 foraminal stenosis.  C6-C7: Circumferential disc bulge with broad-based left foraminal component (series 2, image 10). Endplate spurring. Mild facet and  ligament flavum hypertrophy. No spinal stenosis. Moderate to severe left C7 neural foraminal stenosis.  C7-T1:  Mild facet hypertrophy.  No stenosis.  Thoracic findings are reported separately today.  IMPRESSION: 1. Marrow edema suggesting acute exacerbation of chronic facet joint arthritis at the left C2-C3 and right C3-C4 levels. There is some associated posterior and rightward degenerative endplate marrow edema at C3-C4. 2. Multifactorial spinal stenosis with up to mild spinal cord mass effect C2-C3 through C4-C5. No cord signal abnormality. 3. Moderate or severe neural foraminal stenosis at the bilateral C4, left C7, and to a lesser extent bilateral C5 nerve levels.   Electronically Signed   By: Genevie Ann M.D.   On: 10/24/2017 09:24   She reports that she quit smoking about 32 years ago. Her smoking use included cigarettes. She has a 22.00 pack-year smoking history. She has never used smokeless tobacco.  Recent Labs    07/01/18 1234  HGBA1C 4.7    Objective:  VS:  HT:    WT:   BMI:     BP:(!) 148/69  HR:98bpm  TEMP: ( )  RESP:99 % Physical Exam  Ortho Exam Imaging: Xr C-arm No Report  Result Date: 11/20/2018 Please see Notes tab for imaging impression.   Past Medical/Family/Surgical/Social History: Medications & Allergies reviewed per EMR, new medications updated. Patient Active Problem List   Diagnosis Date Noted  . Bronchitis 06/03/2018  . Encounter for Medicare annual wellness exam 05/09/2018  . Vitamin D deficiency 11/05/2017  . Encounter for hepatitis C virus screening test for high risk patient 11/05/2017  . Dysphagia 09/03/2017  . Thrombocytopenia ([place]) 09/03/2017  . Healthcare maintenance 07/30/2017  . Chronic pain syndrome 07/30/2017  . Chest pain 07/30/2017  . Parkinson's disease ([place]) 07/30/2017  . Hypertension 07/30/2017  . Severe obesity (BMI >= 40) ([place]) 05/15/2016  . Severe episode of recurrent major depressive disorder, without  psychotic features ([place]) 05/15/2016  . Tremor of both hands 05/15/2016  . Irritant contact dermatitis 08/25/2015   Past Medical History:  Diagnosis Date  . Anemia   . Angina pectoris ([place])   . Arthritis   . Asthma    years ago  . Cancer (HCC)    squamous cell carcinoma, nose  . Cataract   . Chronic kidney disease    possibly per pt  . Cirrhosis ([place])   . Clotting disorder (H[facility])    platelets are low  . Depression   . Drug-induced low platelet count   . Duodenal ulcer   . Enlarged thyroid   . Family history of adverse reaction to anesthesia    father had difficulty waking up and breathing on his own after surgery  . GERD (gastroesophageal reflux disease)   . Granulomatous disease ([place])   . Hepatic disease   . Hiatal hernia   . History of kidney stones   . Hypertension   . Low vitamin D level   . Neuropathy   . Osteopenia   . Osteoporosis   . Parkinson's disease ([place])   . Pneumonia   . Portal hypertension ([place])   .  Splenomegaly a   Family History  Problem Relation Age of Onset  . Cancer Mother        ovarian  . Hyperlipidemia Mother   . Alzheimer's disease Mother   . Arthritis Mother        rheumatoid  . Cancer Father        lung  . Parkinson's disease Father   . Alzheimer's disease Father   . ALS Sister   . Cancer Maternal Aunt        pancreatic, lung, liver,breast  . Breast cancer Maternal Aunt   . Cancer Maternal Uncle        pancreatic, liver, brain  . Heart attack Paternal Aunt   . Stroke Paternal Aunt   . Heart attack Paternal Uncle   . Stroke Paternal Grandmother   . Heart attack Paternal Grandfather   . Arthritis Sister        rheumatoid  . Huntington's disease Daughter        presumed inherited from father per patient  . Colon cancer Maternal Uncle   . Breast cancer Maternal Aunt   . Breast cancer Maternal Aunt    Past Surgical History:  Procedure Laterality Date  . ABDOMINAL HYSTERECTOMY    . APPENDECTOMY    . BACK SURGERY      thoracic-fractured   . BACK SURGERY     lumbar - fractured  . BREAST LUMPECTOMY WITH RADIOACTIVE SEED LOCALIZATION Right 08/01/2018   Procedure: RADIOACTIVE SEED GUIDED RIGHT BREAST LUMPECTOMY;  Surgeon: Coralie Keens, MD;  Location: [facility];  Service: General;  Laterality: Right;  . ESOPHAGOGASTRODUODENOSCOPY  05/20/2008   Mild to moderate esophagitis. Otherwise normal EGD.   Marland Kitchen EYE SURGERY Left    cataract surgery with lens implant  . GASTROSTOMY     peptic ulcer - peg tube placement   Social History   Occupational History  . Not on file  Tobacco Use  . Smoking status: Former Smoker    Packs/day: 1.00    Years: 22.00    Pack years: 22.00    Types: Cigarettes    Last attempt to quit: 06/26/1986    Years since quitting: 32.4  . Smokeless tobacco: Never Used  Substance and Sexual Activity  . Alcohol use: No  . Drug use: No  . Sexual activity: Not Currently    Birth control/protection: None

## 2018-11-21 NOTE — Procedures (Signed)
Cervical Facet Nerve Denervation  Patient: Amy Moon      Date of Birth: Oct 30, 1950 MRN: [id number] PCP: [name], NP      Visit Date: 11/20/2018   Universal Protocol:    Date/Time: 11/20/2008:55 AM  Consent Given By: the patient  Position: Side Lying  Additional Comments: Vital signs were monitored before and after the procedure. Patient was prepped and draped in the usual sterile fashion. The correct patient, procedure, and site was verified.   Injection Procedure Details:  Procedure Site One Meds Administered:  Meds ordered this encounter  Medications  . methylPREDNISolone acetate (DEPO-MEDROL) injection 80 mg  . cyclobenzaprine (FLEXERIL) 10 MG tablet    Sig: Take 1 by mouth every 8 hrs as needed    Dispense:  30 tablet    Refill:  0     Laterality: Left  Location/Site:  C2-3 C3-4  Needle size: 20 G  Needle type: RF cannula, 5 mm active tip  Findings:  -Comments:   Procedure Details: The fluoroscope beam was positioned to square off the endplates of the desired vertebral level to achieve a true LATERAL position with care to align facet joints. For each target described below the skin was anesthetized with 1 ml of 1% Lidocaine without epinephrine.  To denervate the facet joint nerve to C2 (third occipital nerve), the cannula was advanced under fluoroscopic guidance and positioned over the inferior lateral portion of the C2/3 facet joint nerve where the third occipital nerve lies.  A minimum of three lesions were made along the location of the nerve.  To denervate the facet joint nerves from C3 through C7, the lateral masses of these respective levels were localized under fluoroscopic visualization.  An outer 18 gauge, 45mm active tip cannula was inserted with an initial target area a few centimeters dorsal to the main target area which is the center point of the trapezoidal area of the articular pillar.  The cannula was then positioned using biplanar  imaging to touchdown had an oblique angle into the center of this trapezoidal area.  To denervate the C8 facet joint nerve, the cannula was fluoroscopically introduced onto the Tl transverse process at its most medial superior end.  For all of these levels, AP and lateral images were used to confirm location.  The radiofrequency probe was inserted into the cannula and stimulation was carried out at both sensory and motor levels to make sure there was expected stimulation without a radicular pattern. Subsequently, this was removed and then 0.5 to 1 ml. of 1% Lidocaine was injected. The radiofrequency probe was re-inserted and denervation of the facet nerves (medial branches of the dorsal rami innervating the facet joints) was carried out at 80-85 degrees Celsius for 60 seconds. Then the cannulas were repositioned to get one additional lesion at each level (total of two) for better efficacy. The above procedure was repeated for each facet joint nerve mentioned above. Radiographs were obtained at each level (unless otherwise noted) to verify probe placement during the neurotomy.   Additional Comments:  The patient tolerated the procedure well No complications occurred Dressing: Band-Aid    Post-procedure details: Patient was observed during the procedure. Post-procedure instructions were reviewed. Patient left the clinic in stable condition.

## 2018-11-23 ENCOUNTER — Telehealth: Payer: Self-pay

## 2018-11-23 DIAGNOSIS — Z20822 Contact with and (suspected) exposure to covid-19: Secondary | ICD-10-CM

## 2018-11-23 NOTE — Telephone Encounter (Signed)
Pt already scheduled for covid testing on Monday.

## 2018-11-23 NOTE — Telephone Encounter (Signed)
Patient called and advised of potential exposure at last OV on 11/20/18 at [facility] and that free testing is being offered if agreeable. Patient agrees, appointment scheduled for Monday, 11/25/18 at [facility], advised of location and to wear a mask for everyone in the vehicle, she verbalized understanding.

## 2018-11-25 ENCOUNTER — Other Ambulatory Visit: Payer: Medicare HMO

## 2018-11-25 ENCOUNTER — Ambulatory Visit: Payer: Medicare HMO | Admitting: Specialist

## 2018-11-25 DIAGNOSIS — Z20822 Contact with and (suspected) exposure to covid-19: Secondary | ICD-10-CM

## 2018-11-26 LAB — NOVEL CORONAVIRUS, NAA: SARS-CoV-2, NAA: NOT DETECTED

## 2018-12-06 ENCOUNTER — Other Ambulatory Visit: Payer: Self-pay | Admitting: Physical Medicine and Rehabilitation

## 2018-12-06 NOTE — Telephone Encounter (Signed)
Please advise 

## 2018-12-10 ENCOUNTER — Ambulatory Visit (INDEPENDENT_AMBULATORY_CARE_PROVIDER_SITE_OTHER): Payer: Medicare HMO | Admitting: Physical Medicine and Rehabilitation

## 2018-12-10 ENCOUNTER — Encounter: Payer: Self-pay | Admitting: Physical Medicine and Rehabilitation

## 2018-12-10 ENCOUNTER — Ambulatory Visit: Payer: Self-pay

## 2018-12-10 ENCOUNTER — Other Ambulatory Visit: Payer: Self-pay

## 2018-12-10 VITALS — BP 136/76 | HR 93

## 2018-12-10 DIAGNOSIS — M47812 Spondylosis without myelopathy or radiculopathy, cervical region: Secondary | ICD-10-CM

## 2018-12-10 MED ORDER — METHYLPREDNISOLONE ACETATE 80 MG/ML IJ SUSP
80.0000 mg | Freq: Once | INTRAMUSCULAR | Status: AC
  Administered 2018-12-10: 80 mg

## 2018-12-10 NOTE — Progress Notes (Signed)
 .  Numeric Pain Rating Scale and Functional Assessment Average Pain 7   In the last MONTH (on 0-10 scale) has pain interfered with the following?  1. General activity like being  able to carry out your everyday physical activities such as walking, climbing stairs, carrying groceries, or moving a chair?  Rating(7)   +Driver, +BT(plavix, ok for inj), -Dye Allergies.

## 2018-12-19 ENCOUNTER — Other Ambulatory Visit: Payer: Self-pay | Admitting: Radiology

## 2018-12-19 MED ORDER — CYCLOBENZAPRINE HCL 10 MG PO TABS: ORAL_TABLET | ORAL | 0 refills | Status: DC

## 2018-12-20 ENCOUNTER — Ambulatory Visit (INDEPENDENT_AMBULATORY_CARE_PROVIDER_SITE_OTHER): Payer: Medicare HMO

## 2018-12-20 ENCOUNTER — Encounter: Payer: Self-pay | Admitting: Specialist

## 2018-12-20 ENCOUNTER — Other Ambulatory Visit: Payer: Self-pay

## 2018-12-20 ENCOUNTER — Ambulatory Visit (INDEPENDENT_AMBULATORY_CARE_PROVIDER_SITE_OTHER): Payer: Medicare HMO | Admitting: Specialist

## 2018-12-20 VITALS — BP 178/89 | HR 86 | Ht 60.0 in | Wt 154.0 lb

## 2018-12-20 DIAGNOSIS — M4807 Spinal stenosis, lumbosacral region: Secondary | ICD-10-CM

## 2018-12-20 DIAGNOSIS — M1712 Unilateral primary osteoarthritis, left knee: Secondary | ICD-10-CM | POA: Diagnosis not present

## 2018-12-20 DIAGNOSIS — M51369 Other intervertebral disc degeneration, lumbar region without mention of lumbar back pain or lower extremity pain: Secondary | ICD-10-CM

## 2018-12-20 DIAGNOSIS — M25562 Pain in left knee: Secondary | ICD-10-CM

## 2018-12-20 DIAGNOSIS — M5136 Other intervertebral disc degeneration, lumbar region: Secondary | ICD-10-CM

## 2018-12-20 MED ORDER — METHYLPREDNISOLONE 4 MG PO TABS: ORAL_TABLET | ORAL | 0 refills | Status: AC

## 2018-12-20 MED ORDER — BUPIVACAINE HCL 0.25 % IJ SOLN
4.0000 mL | INTRAMUSCULAR | Status: AC | PRN
  Administered 2018-12-20: 4 mL via INTRA_ARTICULAR

## 2018-12-20 MED ORDER — METHYLPREDNISOLONE ACETATE 40 MG/ML IJ SUSP
40.0000 mg | INTRAMUSCULAR | Status: AC | PRN
  Administered 2018-12-20: 40 mg via INTRA_ARTICULAR

## 2018-12-20 MED ORDER — HYDROCODONE-ACETAMINOPHEN 5-325 MG PO TABS: 1.0000 | ORAL_TABLET | Freq: Four times a day (QID) | ORAL | 0 refills | Status: AC | PRN

## 2018-12-20 NOTE — Patient Instructions (Addendum)
Avoid bending, stooping and avoid lifting weights greater than 10 lbs. Avoid prolong standing and walking. Avoid frequent bending and stooping  No lifting greater than 10 lbs. May use ice or moist heat for pain. Weight loss is of benefit. Handicap license is approved. Knee is suffering from osteoarthritis, only real proven treatments are Weight loss, steroid to decrease inflamation and exercise. Well padded shoes help. Ice the knee 2-3 times a day 15-20 mins at a time.

## 2018-12-20 NOTE — Progress Notes (Signed)
Office Visit Note   Patient: Amy Moon           Date of Birth: October 10, 1950           MRN: [id number] Visit Date: 12/20/2018              Requested by: [name], NP [place],   [id number] PCP: [name], NP   Assessment & Plan: Visit Diagnoses:  1. Degenerative disc disease, lumbar   2. Left knee pain, unspecified chronicity   3. Other intervertebral disc degeneration, lumbar region   4. Spinal stenosis of lumbosacral region     Plan: Avoid bending, stooping and avoid lifting weights greater than 10 lbs. Avoid prolong standing and walking. Avoid frequent bending and stooping  No lifting greater than 10 lbs. May use ice or moist heat for pain. Weight loss is of benefit. Handicap license is approved.  Knee is suffering from osteoarthritis, only real proven treatments are Weight loss, steroid to decrease inflamation and exercise. Well padded shoes help. Ice the knee 2-3 times a day 15-20 mins at a time.  Follow-Up Instructions: Return in about 3 weeks (around 01/10/2019) for overbook appt on a Thurs..   Orders:  Orders Placed This Encounter  Procedures  . Large Joint Inj  . XR KNEE 3 VIEW LEFT  . XR Lumbar Spine 2-3 Views   No orders of the defined types were placed in this encounter.     Procedures: Large Joint Inj: L knee on 12/20/2018 11:56 AM Indications: pain Details: 25 G 1.5 in needle, anterolateral approach  Arthrogram: No  Medications: 40 mg methylPREDNISolone acetate 40 MG/ML; 4 mL bupivacaine 0.25 % Outcome: tolerated well, no immediate complications  Bandaid applied. Procedure, treatment alternatives, risks and benefits explained, specific risks discussed. Consent was given by the patient. Immediately prior to procedure a time out was called to verify the correct patient, procedure, equipment, support staff and site/side marked as required. Patient was prepped and draped in the usual sterile fashion.        Clinical Data: No additional findings.   Subjective: Chief Complaint  Patient presents with  . Left Knee - Pain, Edema  . Lower Back - Pain    68 year old female with pain in the back and left knee. Feels like electricity shooting into the feet with cramping in the Legs. Complains of left knee giving away and stiffness in the AM in the left knee. Does not want to stay in bed so long due to the back being stiff with electric feeling into the legs. She is not able to walk more than from here to the front desk and has to go out the side door. Not able to grocery shop well, just a quick directed trip for milk of or fruit. No bowel or bladder difficulty with thin not steady urine and a lot of problems with constipation. Right sided feet electrical pain seems to be worse than the left.    Review of Systems  Constitutional: Positive for activity change. Negative for appetite change, chills, diaphoresis, fatigue, fever and unexpected weight change.  HENT: Negative.  Negative for congestion, dental problem, drooling, ear discharge, ear pain, facial swelling, hearing loss, mouth sores, nosebleeds, postnasal drip, rhinorrhea, sinus pressure, sinus pain, sneezing, sore throat, tinnitus, trouble swallowing and voice change.   Eyes: Negative for photophobia, pain, discharge, redness, itching and visual disturbance.  Respiratory: Positive for wheezing. Negative for apnea, cough, choking, chest tightness,  shortness of breath and stridor.   Cardiovascular: Positive for leg swelling. Negative for chest pain and palpitations.  Gastrointestinal: Negative for abdominal distention, abdominal pain, anal bleeding, blood in stool, constipation, diarrhea, nausea, rectal pain and vomiting.  Endocrine: Negative for cold intolerance, heat intolerance, polydipsia, polyphagia and polyuria.  Genitourinary: Positive for difficulty urinating and flank pain. Negative for dyspareunia, dysuria, enuresis, frequency and  hematuria.  Musculoskeletal: Positive for back pain, neck pain and neck stiffness. Negative for arthralgias, gait problem, joint swelling and myalgias.  Allergic/Immunologic: Positive for environmental allergies.  Neurological: Positive for weakness and numbness. Negative for dizziness, tremors, seizures, syncope, facial asymmetry, speech difficulty, light-headedness and headaches.  Hematological: Negative for adenopathy. Does not bruise/bleed easily.  Psychiatric/Behavioral: Negative for agitation, behavioral problems, confusion, decreased concentration, dysphoric mood, hallucinations, self-injury, sleep disturbance and suicidal ideas. The patient is not nervous/anxious and is not hyperactive.      Objective: Vital Signs: BP (!) 178/89 (BP Location: Left Arm, Patient Position: Sitting)   Pulse 86   Ht 5' (1.524 m)   Wt 154 lb (69.9 kg)   BMI 30.08 kg/m   Physical Exam  Ortho Exam  Specialty Comments:  No specialty comments available.  Imaging: Xr Knee 3 View Left  Result Date: 12/20/2018 Left knee standing AP and lateral with patella sunrise view shows varus deformity of the left knee with medial subluxation of the left knee about 4 mm. There is bone on bone appearance the medial joint line and subchondral sclerosis affecting the medial tibial plateau. There is mild irregular posterior joint surface to the patella with mild superior and inferior pole osteophytes. Varus deformity of about 7-8 degrees.   Xr Lumbar Spine 2-3 Views  Result Date: 12/20/2018 Ap and lateral flexion and extension radiographs of the lumbar spine with diffuse DDD worst at L2-3 and L3-4 with disc narrowing, endplate sclerosis present there is straightening of the normal lordotic curve. No fracture or dislocation or subluxation.     PMFS History: Patient Active Problem List   Diagnosis Date Noted  . Bronchitis 06/03/2018  . Encounter for Medicare annual wellness exam 05/09/2018  . Vitamin D deficiency  11/05/2017  . Encounter for hepatitis C virus screening test for high risk patient 11/05/2017  . Dysphagia 09/03/2017  . Thrombocytopenia ([place]) 09/03/2017  . Healthcare maintenance 07/30/2017  . Chronic pain syndrome 07/30/2017  . Chest pain 07/30/2017  . Parkinson's disease ([place]) 07/30/2017  . Hypertension 07/30/2017  . Severe obesity (BMI >= 40) ([place]) 05/15/2016  . Severe episode of recurrent major depressive disorder, without psychotic features ([place]) 05/15/2016  . Tremor of both hands 05/15/2016  . Irritant contact dermatitis 08/25/2015   Past Medical History:  Diagnosis Date  . Anemia   . Angina pectoris ([place])   . Arthritis   . Asthma    years ago  . Cancer (HCC)    squamous cell carcinoma, nose  . Cataract   . Chronic kidney disease    possibly per pt  . Cirrhosis ([place])   . Clotting disorder (HCC)    platelets are low  . Depression   . Drug-induced low platelet count   . Duodenal ulcer   . Enlarged thyroid   . Family history of adverse reaction to anesthesia    father had difficulty waking up and breathing on his own after surgery  . GERD (gastroesophageal reflux disease)   . Granulomatous disease ([place])   . Hepatic disease   . Hiatal hernia   . History of kidney  stones   . Hypertension   . Low vitamin D level   . Neuropathy   . Osteopenia   . Osteoporosis   . Parkinson's disease ([place])   . Pneumonia   . Portal hypertension ([place])   . Splenomegaly a    Family History  Problem Relation Age of Onset  . Cancer Mother        ovarian  . Hyperlipidemia Mother   . Alzheimer's disease Mother   . Arthritis Mother        rheumatoid  . Cancer Father        lung  . Parkinson's disease Father   . Alzheimer's disease Father   . ALS Sister   . Cancer Maternal Aunt        pancreatic, lung, liver,breast  . Breast cancer Maternal Aunt   . Cancer Maternal Uncle        pancreatic, liver, brain  . Heart attack Paternal Aunt   . Stroke Paternal Aunt   . Heart attack  Paternal Uncle   . Stroke Paternal Grandmother   . Heart attack Paternal Grandfather   . Arthritis Sister        rheumatoid  . Huntington's disease Daughter        presumed inherited from father per patient  . Colon cancer Maternal Uncle   . Breast cancer Maternal Aunt   . Breast cancer Maternal Aunt     Past Surgical History:  Procedure Laterality Date  . ABDOMINAL HYSTERECTOMY    . APPENDECTOMY    . BACK SURGERY     thoracic-fractured   . BACK SURGERY     lumbar - fractured  . BREAST LUMPECTOMY WITH RADIOACTIVE SEED LOCALIZATION Right 08/01/2018   Procedure: RADIOACTIVE SEED GUIDED RIGHT BREAST LUMPECTOMY;  Surgeon: Coralie Keens, MD;  Location: [facility];  Service: General;  Laterality: Right;  . ESOPHAGOGASTRODUODENOSCOPY  05/20/2008   Mild to moderate esophagitis. Otherwise normal EGD.   Marland Kitchen EYE SURGERY Left    cataract surgery with lens implant  . GASTROSTOMY     peptic ulcer - peg tube placement   Social History   Occupational History  . Not on file  Tobacco Use  . Smoking status: Former Smoker    Packs/day: 1.00    Years: 22.00    Pack years: 22.00    Types: Cigarettes    Quit date: 06/26/1986    Years since quitting: 32.5  . Smokeless tobacco: Never Used  Substance and Sexual Activity  . Alcohol use: No  . Drug use: No  . Sexual activity: Not Currently    Birth control/protection: None

## 2019-01-02 ENCOUNTER — Ambulatory Visit
Admission: RE | Admit: 2019-01-02 | Discharge: 2019-01-02 | Disposition: A | Discharge status: Home / Self Care | Payer: Medicare HMO | Source: Ambulatory Visit | Attending: Specialist | Admitting: Specialist

## 2019-01-02 ENCOUNTER — Other Ambulatory Visit: Payer: Self-pay

## 2019-01-02 DIAGNOSIS — M4807 Spinal stenosis, lumbosacral region: Secondary | ICD-10-CM

## 2019-01-02 DIAGNOSIS — M5136 Other intervertebral disc degeneration, lumbar region: Secondary | ICD-10-CM

## 2019-01-06 ENCOUNTER — Other Ambulatory Visit: Payer: Self-pay | Admitting: Specialist

## 2019-01-06 NOTE — Telephone Encounter (Signed)
Cyclobenzaprine refill request

## 2019-01-09 ENCOUNTER — Encounter: Payer: Self-pay | Admitting: Specialist

## 2019-01-09 ENCOUNTER — Ambulatory Visit (INDEPENDENT_AMBULATORY_CARE_PROVIDER_SITE_OTHER): Payer: Medicare HMO | Admitting: Specialist

## 2019-01-09 ENCOUNTER — Other Ambulatory Visit: Payer: Self-pay

## 2019-01-09 VITALS — BP 138/81 | HR 108 | Ht 60.0 in | Wt 154.0 lb

## 2019-01-09 DIAGNOSIS — M792 Neuralgia and neuritis, unspecified: Secondary | ICD-10-CM | POA: Diagnosis not present

## 2019-01-09 DIAGNOSIS — M79605 Pain in left leg: Secondary | ICD-10-CM | POA: Diagnosis not present

## 2019-01-09 DIAGNOSIS — M1712 Unilateral primary osteoarthritis, left knee: Secondary | ICD-10-CM

## 2019-01-09 MED ORDER — HYDROCODONE-ACETAMINOPHEN 5-325 MG PO TABS: 1.0000 | ORAL_TABLET | Freq: Four times a day (QID) | ORAL | 0 refills | Status: DC | PRN

## 2019-01-09 NOTE — Patient Instructions (Addendum)
  Knee is suffering from osteoarthritis, only real proven treatments are Weight loss, do not take arthrits medications due to ulcer disease. and exercise. Well padded shoes help. Ice the knee 2-3 times a day 15-20 mins at a time. Avoid frequent bending and stooping  No lifting greater than 10 lbs. May use ice or moist heat for pain. Weight loss is of benefit. Exercise is important to improve your indurance and does allow people to function better inspite of back pain.

## 2019-01-09 NOTE — Progress Notes (Signed)
Office Visit Note   Patient: Amy Moon           Date of Birth: 21-Feb-1951           MRN: [id number] Visit Date: 01/09/2019              Requested by: [name], NP [place],  [place] [address] PCP: [name], NP   Assessment & Plan: Visit Diagnoses:  1. Pain in left leg   2. Unilateral primary osteoarthritis, left knee   3. Leg neuralgia, left     Plan: Knee is suffering from osteoarthritis, only real proven treatments are Weight loss, do not take arthrits medications due to ulcer disease. and exercise. Well padded shoes help. Ice the knee 2-3 times a day 15-20 mins at a time. Avoid frequent bending and stooping  No lifting greater than 10 lbs. May use ice or moist heat for pain. Weight loss is of benefit. Exercise is important to improve your indurance and does allow people to function better inspite of back pain.  Follow-Up Instructions: Return in about 3 weeks (around 01/30/2019).   Orders:  Orders Placed This Encounter  Procedures  . Ambulatory referral to Physical Medicine Rehab   Meds ordered this encounter  Medications  . HYDROcodone-acetaminophen (NORCO/VICODIN) 5-325 MG tablet    Sig: Take 1 tablet by mouth every 6 (six) hours as needed for moderate pain.    Dispense:  30 tablet    Refill:  0      Procedures: No procedures performed   Clinical Data: No additional findings.   Subjective: Chief Complaint  Patient presents with  . Lower Back - Follow-up  . MRI Review    68 year old female with history of UGI Ulcer disease and is unable to tolerate NSAIDs she has severe back pain with electrical burning pain into the left leg laterally and into the left great toe. It is a shooting pain that is getting worse. No bowel or bladder difficulty, with constipation. She has pain with sitting for an hour an she has to lie down. She is taking flexeril and hydrocodone. The hydrocodone once a day before bed. She is not able to do  more than a  quick trip to the grocery show.    Review of Systems  Constitutional: Negative.   HENT: Negative.   Eyes: Negative.   Respiratory: Negative.   Cardiovascular: Negative.   Gastrointestinal: Negative.   Endocrine: Negative.   Genitourinary: Negative.   Musculoskeletal: Positive for back pain and gait problem. Negative for joint swelling, myalgias, neck pain and neck stiffness.  Skin: Negative.   Allergic/Immunologic: Negative.   Neurological: Negative for dizziness, tremors, seizures, syncope, facial asymmetry, speech difficulty, weakness, light-headedness, numbness and headaches.  Hematological: Negative.  Negative for adenopathy. Does not bruise/bleed easily.  Psychiatric/Behavioral: Negative.  Negative for agitation, behavioral problems, confusion, decreased concentration, dysphoric mood, hallucinations, self-injury, sleep disturbance and suicidal ideas. The patient is not nervous/anxious and is not hyperactive.      Objective: Vital Signs: BP 138/81   Pulse (!) 108   Ht 5' (1.524 m)   Wt 154 lb (69.9 kg)   BMI 30.08 kg/m   Physical Exam  Back Exam   Tenderness  The patient is experiencing tenderness in the lumbar.  Range of Motion  Extension: abnormal  Flexion: abnormal  Lateral bend right: abnormal  Lateral bend left: abnormal  Rotation right: abnormal  Rotation left: abnormal   Muscle Strength  Right Quadriceps:  5/5  Left Quadriceps:  5/5  Right Hamstrings:  5/5  Left Hamstrings:  5/5   Tests  Straight leg raise right: negative Straight leg raise left: negative  Reflexes  Patellar: 2/4 Achilles: 2/4 Biceps: normal Babinski's sign: normal   Other  Toe walk: normal Heel walk: normal Sensation: decreased Gait: normal  Erythema: no back redness Scars: absent  Comments:  Left EHL and hip adductor weakness.       Specialty Comments:  No specialty comments available.  Imaging: No results found.   PMFS History: Patient  Active Problem List   Diagnosis Date Noted  . Bronchitis 06/03/2018  . Encounter for Medicare annual wellness exam 05/09/2018  . Vitamin D deficiency 11/05/2017  . Encounter for hepatitis C virus screening test for high risk patient 11/05/2017  . Dysphagia 09/03/2017  . Thrombocytopenia ([place]) 09/03/2017  . Healthcare maintenance 07/30/2017  . Chronic pain syndrome 07/30/2017  . Chest pain 07/30/2017  . Parkinson's disease ([place]) 07/30/2017  . Hypertension 07/30/2017  . Severe obesity (BMI >= 40) ([place]) 05/15/2016  . Severe episode of recurrent major depressive disorder, without psychotic features ([place]) 05/15/2016  . Tremor of both hands 05/15/2016  . Irritant contact dermatitis 08/25/2015   Past Medical History:  Diagnosis Date  . Anemia   . Angina pectoris ([place])   . Arthritis   . Asthma    years ago  . Cancer (HCC)    squamous cell carcinoma, nose  . Cataract   . Chronic kidney disease    possibly per pt  . Cirrhosis ([place])   . Clotting disorder (HCC)    platelets are low  . Depression   . Drug-induced low platelet count   . Duodenal ulcer   . Enlarged thyroid   . Family history of adverse reaction to anesthesia    father had difficulty waking up and breathing on his own after surgery  . GERD (gastroesophageal reflux disease)   . Granulomatous disease ([place])   . Hepatic disease   . Hiatal hernia   . History of kidney stones   . Hypertension   . Low vitamin D level   . Neuropathy   . Osteopenia   . Osteoporosis   . Parkinson's disease ([place])   . Pneumonia   . Portal hypertension ([place])   . Splenomegaly a    Family History  Problem Relation Age of Onset  . Cancer Mother        ovarian  . Hyperlipidemia Mother   . Alzheimer's disease Mother   . Arthritis Mother        rheumatoid  . Cancer Father        lung  . Parkinson's disease Father   . Alzheimer's disease Father   . ALS Sister   . Cancer Maternal Aunt        pancreatic, lung, liver,breast  . Breast  cancer Maternal Aunt   . Cancer Maternal Uncle        pancreatic, liver, brain  . Heart attack Paternal Aunt   . Stroke Paternal Aunt   . Heart attack Paternal Uncle   . Stroke Paternal Grandmother   . Heart attack Paternal Grandfather   . Arthritis Sister        rheumatoid  . Huntington's disease Daughter        presumed inherited from father per patient  . Colon cancer Maternal Uncle   . Breast cancer Maternal Aunt   . Breast cancer Maternal Aunt  Past Surgical History:  Procedure Laterality Date  . ABDOMINAL HYSTERECTOMY    . APPENDECTOMY    . BACK SURGERY     thoracic-fractured   . BACK SURGERY     lumbar - fractured  . BREAST LUMPECTOMY WITH RADIOACTIVE SEED LOCALIZATION Right 08/01/2018   Procedure: RADIOACTIVE SEED GUIDED RIGHT BREAST LUMPECTOMY;  Surgeon: Coralie Keens, MD;  Location: [facility];  Service: General;  Laterality: Right;  . ESOPHAGOGASTRODUODENOSCOPY  05/20/2008   Mild to moderate esophagitis. Otherwise normal EGD.   Marland Kitchen EYE SURGERY Left    cataract surgery with lens implant  . GASTROSTOMY     peptic ulcer - peg tube placement   Social History   Occupational History  . Not on file  Tobacco Use  . Smoking status: Former Smoker    Packs/day: 1.00    Years: 22.00    Pack years: 22.00    Types: Cigarettes    Quit date: 06/26/1986    Years since quitting: 32.5  . Smokeless tobacco: Never Used  Substance and Sexual Activity  . Alcohol use: No  . Drug use: No  . Sexual activity: Not Currently    Birth control/protection: None

## 2019-01-18 ENCOUNTER — Other Ambulatory Visit: Payer: Medicare HMO

## 2019-01-20 ENCOUNTER — Other Ambulatory Visit: Payer: Self-pay | Admitting: Specialist

## 2019-01-20 NOTE — Telephone Encounter (Signed)
Please advise 

## 2019-01-23 ENCOUNTER — Other Ambulatory Visit: Payer: Self-pay | Admitting: Specialist

## 2019-01-23 NOTE — Telephone Encounter (Signed)
norco refill request 

## 2019-01-24 NOTE — Procedures (Signed)
Cervical Facet Nerve Denervation  Patient: Amy Moon      Date of Birth: [birth year] MRN: [id number] PCP: [name], NP      Visit Date: 12/10/2018   Universal Protocol:    Date/Time: 07/31/204:37 PM  Consent Given By: the patient  Position: Side Lying  Additional Comments: Vital signs were monitored before and after the procedure. Patient was prepped and draped in the usual sterile fashion. The correct patient, procedure, and site was verified.   Injection Procedure Details:  Procedure Site One Meds Administered:  Meds ordered this encounter  Medications  . methylPREDNISolone acetate (DEPO-MEDROL) injection 80 mg     Laterality: Right  Location/Site:  C2-3 C3-4  Needle size: 20 G  Needle type: RF cannula, 5 mm active tip  Findings:  -Comments:   Procedure Details: The fluoroscope beam was positioned to square off the endplates of the desired vertebral level to achieve a true LATERAL position with care to align facet joints. For each target described below the skin was anesthetized with 1 ml of 1% Lidocaine without epinephrine.  To denervate the facet joint nerve to C2 (third occipital nerve), the cannula was advanced under fluoroscopic guidance and positioned over the inferior lateral portion of the C2/3 facet joint nerve where the third occipital nerve lies.  A minimum of three lesions were made along the location of the nerve.  To denervate the facet joint nerves from C3 through C7, the lateral masses of these respective levels were localized under fluoroscopic visualization.  An outer 18 gauge, 55mm active tip cannula was inserted with an initial target area a few centimeters dorsal to the main target area which is the center point of the trapezoidal area of the articular pillar.  The cannula was then positioned using biplanar imaging to touchdown had an oblique angle into the center of this trapezoidal area.  For all of these levels, AP and lateral images  were used to confirm location.  The radiofrequency probe was inserted into the cannula and stimulation was carried out at both sensory and motor levels to make sure there was expected stimulation without a radicular pattern. Subsequently, this was removed and then 0.5 to 1 ml. of 1% Lidocaine was injected. The radiofrequency probe was re-inserted and denervation of the facet nerves (medial branches of the dorsal rami innervating the facet joints) was carried out at 80-85 degrees Celsius for 60 seconds. Then the cannulas were repositioned to get one additional lesion at each level (total of two) for better efficacy. The above procedure was repeated for each facet joint nerve mentioned above. Radiographs were obtained at each level (unless otherwise noted) to verify probe placement during the neurotomy.   Additional Comments:  The patient tolerated the procedure well Dressing: Band-Aid    Post-procedure details: Patient was observed during the procedure. Post-procedure instructions were reviewed. Patient left the clinic in stable condition.

## 2019-01-24 NOTE — Progress Notes (Signed)
JAMELLE NOY - 68 y.o. female MRN [id number]  Date of birth: 10/28/1950  Office Visit Note: Visit Date: 12/10/2018 PCP: [name], NP Referred by: [name], NP  Subjective: Chief Complaint  Patient presents with  . Neck - Pain   HPI:  SHELA ESSES is a 68 y.o. female who comes in today For right sided radiofrequency ablation of the C2-3 and C3-4 facet joints.  Prior left sided ablation has helped her a great deal.  We have completed double diagnostic blocks and a double block paradigm.  She is failed conservative care including medication management time and physical therapy and home exercises.  Notes can be reviewed as she is followed by Dr. Basil Dess in the office.  Our notes can also be reviewed.  Imaging is reviewed below.  Exam is consistent with facet mediated neck pain with pain on rotation and extension without radicular pain.  ROS Otherwise per HPI.  Assessment & Plan: Visit Diagnoses:  1. Cervical spondylosis without myelopathy     Plan: No additional findings.   Meds & Orders:  Meds ordered this encounter  Medications  . methylPREDNISolone acetate (DEPO-MEDROL) injection 80 mg    Orders Placed This Encounter  Procedures  . Radiofrequency,Cervical  . XR C-ARM NO REPORT    Follow-up: No follow-ups on file.   Procedures: No procedures performed  Cervical Facet Nerve Denervation  Patient: TYARRA NOLTON      Date of Birth: 1951-05-19 MRN: [id number] PCP: [name], NP      Visit Date: 12/10/2018   Universal Protocol:    Date/Time: 07/31/204:37 PM  Consent Given By: the patient  Position: Side Lying  Additional Comments: Vital signs were monitored before and after the procedure. Patient was prepped and draped in the usual sterile fashion. The correct patient, procedure, and site was verified.   Injection Procedure Details:  Procedure Site One Meds Administered:  Meds ordered this encounter  Medications  . methylPREDNISolone  acetate (DEPO-MEDROL) injection 80 mg     Laterality: Right  Location/Site:  C2-3 C3-4  Needle size: 20 G  Needle type: RF cannula, 5 mm active tip  Findings:  -Comments:   Procedure Details: The fluoroscope beam was positioned to square off the endplates of the desired vertebral level to achieve a true LATERAL position with care to align facet joints. For each target described below the skin was anesthetized with 1 ml of 1% Lidocaine without epinephrine.  To denervate the facet joint nerve to C2 (third occipital nerve), the cannula was advanced under fluoroscopic guidance and positioned over the inferior lateral portion of the C2/3 facet joint nerve where the third occipital nerve lies.  A minimum of three lesions were made along the location of the nerve.  To denervate the facet joint nerves from C3 through C7, the lateral masses of these respective levels were localized under fluoroscopic visualization.  An outer 18 gauge, 33mm active tip cannula was inserted with an initial target area a few centimeters dorsal to the main target area which is the center point of the trapezoidal area of the articular pillar.  The cannula was then positioned using biplanar imaging to touchdown had an oblique angle into the center of this trapezoidal area.  For all of these levels, AP and lateral images were used to confirm location.  The radiofrequency probe was inserted into the cannula and stimulation was carried out at both sensory and motor levels to make sure there was expected  stimulation without a radicular pattern. Subsequently, this was removed and then 0.5 to 1 ml. of 1% Lidocaine was injected. The radiofrequency probe was re-inserted and denervation of the facet nerves (medial branches of the dorsal rami innervating the facet joints) was carried out at 80-85 degrees Celsius for 60 seconds. Then the cannulas were repositioned to get one additional lesion at each level (total of two) for better  efficacy. The above procedure was repeated for each facet joint nerve mentioned above. Radiographs were obtained at each level (unless otherwise noted) to verify probe placement during the neurotomy.   Additional Comments:  The patient tolerated the procedure well Dressing: Band-Aid    Post-procedure details: Patient was observed during the procedure. Post-procedure instructions were reviewed. Patient left the clinic in stable condition.      Clinical History: MRI CERVICAL SPINE WITHOUT CONTRAST  TECHNIQUE: Multiplanar, multisequence MR imaging of the cervical spine was performed. No intravenous contrast was administered.  COMPARISON:  Cervical spine radiographs 10/03/2017.  FINDINGS: Alignment: Improved cervical lordosis compared to the radiographs last month. Mild straightening of lordosis. No spondylolisthesis.  Vertebrae: Heterogeneous endplate marrow signal appears to be degenerative in nature throughout much of the cervical spine. There are areas of mild endplate marrow edema which appears degenerative (posteriorly at C3-C4). Furthermore, there is confluent marrow edema in the left C2-C3 and right C3-C4 facets (series 4, image 1 on the right). These facets are chronically degenerated.  No other acute osseous abnormality identified.  Cord: Spinal cord signal is within normal limits at all visualized levels.  Posterior Fossa, vertebral arteries, paraspinal tissues: Negative visible brain parenchyma. Cervicomedullary junction is within normal limits. Preserved major vascular flow voids in the neck. Several small T2 hyperintense thyroid nodules measure up to 11 mm and do not meet size criteria for ultrasound follow-up. Negative visible lung apices.  Disc levels:  C2-C3: Broad-based mild disc bulging. Moderate facet hypertrophy greater on the left, which corresponds to the site of facet marrow edema. Mild to moderate ligament flavum hypertrophy. Mild  spinal stenosis. Mild cord mass effect. Mild left C3 foraminal stenosis.  C3-C4: Disc space loss with circumferential disc osteophyte complex. Broad-based posterior component. Moderate facet and ligament flavum hypertrophy greater on the right, where there is associated degenerative marrow edema. Spinal stenosis with mild cord mass effect. Moderate to severe bilateral C4 foraminal stenosis greater on the right.  C4-C5: Circumferential disc bulge and endplate spurring with broad-based posterior and biforaminal involvement. Mild facet and ligament flavum hypertrophy. Mild spinal stenosis with no cord mass effect. Moderate bilateral C5 foraminal stenosis.  C5-C6: Circumferential disc bulge with mild endplate spurring. Broad-based left foraminal involvement. Mild facet hypertrophy greater on the left. No spinal stenosis. Mild to moderate left and mild right C6 foraminal stenosis.  C6-C7: Circumferential disc bulge with broad-based left foraminal component (series 2, image 10). Endplate spurring. Mild facet and ligament flavum hypertrophy. No spinal stenosis. Moderate to severe left C7 neural foraminal stenosis.  C7-T1:  Mild facet hypertrophy.  No stenosis.  Thoracic findings are reported separately today.  IMPRESSION: 1. Marrow edema suggesting acute exacerbation of chronic facet joint arthritis at the left C2-C3 and right C3-C4 levels. There is some associated posterior and rightward degenerative endplate marrow edema at C3-C4. 2. Multifactorial spinal stenosis with up to mild spinal cord mass effect C2-C3 through C4-C5. No cord signal abnormality. 3. Moderate or severe neural foraminal stenosis at the bilateral C4, left C7, and to a lesser extent bilateral C5 nerve levels.   Electronically  Signed   By: Genevie Ann M.D.   On: 10/24/2017 09:24     Objective:  VS:  HT:    WT:   BMI:     BP:136/76  HR:93bpm  TEMP: ( )  RESP:  Physical Exam  Ortho Exam Imaging:  No results found.

## 2019-01-29 ENCOUNTER — Other Ambulatory Visit: Payer: Self-pay | Admitting: Specialist

## 2019-01-31 ENCOUNTER — Other Ambulatory Visit: Payer: Self-pay

## 2019-01-31 ENCOUNTER — Ambulatory Visit (INDEPENDENT_AMBULATORY_CARE_PROVIDER_SITE_OTHER): Payer: Medicare HMO | Admitting: Physical Medicine and Rehabilitation

## 2019-01-31 DIAGNOSIS — R202 Paresthesia of skin: Secondary | ICD-10-CM

## 2019-01-31 NOTE — Progress Notes (Signed)
.  Numeric Pain Rating Scale and Functional Assessment Average Pain 10   In the last MONTH (on 0-10 scale) has pain interfered with the following?  1. General activity like being  able to carry out your everyday physical activities such as walking, climbing stairs, carrying groceries, or moving a chair?  Rating(8)    

## 2019-02-03 NOTE — Procedures (Signed)
EMG & NCV Findings: Evaluation of the left tibial motor and the left superficial fibular sensory nerves showed reduced amplitude (L1.8, L2.6 V).  All remaining nerves (as indicated in the following tables) were within normal limits.    All examined muscles (as indicated in the following table) showed no evidence of electrical instability.    Impression: Essentially NORMAL electrodiagnostic study of the left lower limb.  There is no significant electrodiagnostic evidence of nerve entrapment, brachial plexopathy or cervical radiculopathy.   **As you know, purely sensory or demyelinating radiculopathies and chemical radiculitis may not be detected with this particular electrodiagnostic study.  Recommendations: 1.  Follow-up with referring physician. 2.  Continue current management of symptoms.  ___________________________ Laurence Spates FAAPMR Board Certified, American Board of Physical Medicine and Rehabilitation    Nerve Conduction Studies Anti Sensory Summary Table   Stim Site NR Peak (ms) Norm Peak (ms) P-T Amp (V) Norm P-T Amp Site1 Site2 Delta-P (ms) Dist (cm) Vel (m/s) Norm Vel (m/s)  Left Saphenous Anti Sensory (Ant Med Mall)  30.5C  14cm    3.9 <4.4 6.3 >2 14cm Ant Med Mall 3.9 0.0  >32  Left Sup Fibular Anti Sensory (Ant Lat Mall)  30.5C  14 cm    3.5 <4.4 *2.6 >5.0 14 cm Ant Lat Mall 3.5 14.0 40 >32  Left Sural Anti Sensory (Lat Mall)  30.7C  Calf    3.8 <4.0 7.4 >5.0 Calf Lat Mall 3.8 14.0 37 >35   Motor Summary Table   Stim Site NR Onset (ms) Norm Onset (ms) O-P Amp (mV) Norm O-P Amp Site1 Site2 Delta-0 (ms) Dist (cm) Vel (m/s) Norm Vel (m/s)  Left Fibular Motor (Ext Dig Brev)  29.9C  Ankle    4.1 <6.1 3.7 >2.5 B Fib Ankle 6.0 33.0 55 >38  B Fib    10.1  3.5  Poplt B Fib 1.9 10.0 53 >40  Poplt    12.0  3.9         Left Tibial Motor (Abd Hall Brev)  30.4C  Ankle    5.0 <6.1 *1.8 >3.0 Knee Ankle 6.5 34.0 52 >35  Knee    11.5  6.5          EMG   Side Muscle  Nerve Root Ins Act Fibs Psw Amp Dur Poly Recrt Int Fraser Din Comment  Left AntTibialis Dp Br Peron L4-5 Nml Nml Nml Nml Nml 0 Nml Nml   Left Fibularis Longus  Sup Br Peron L5-S1 Nml Nml Nml Nml Nml 0 Nml Nml   Left MedGastroc Tibial S1-2 Nml Nml Nml Nml Nml 0 Nml Nml   Left VastusMed Femoral L2-4 Nml Nml Nml Nml Nml 0 Nml Nml   Left BicepsFemS Sciatic L5-S1 Nml Nml Nml Nml Nml 0 Nml Nml     Nerve Conduction Studies Anti Sensory Left/Right Comparison   Stim Site L Lat (ms) R Lat (ms) L-R Lat (ms) L Amp (V) R Amp (V) L-R Amp (%) Site1 Site2 L Vel (m/s) R Vel (m/s) L-R Vel (m/s)  Saphenous Anti Sensory (Ant Med Mall)  30.5C  14cm 3.9   6.3   14cm Ant Med Mall     Sup Fibular Anti Sensory (Ant Lat Mall)  30.5C  14 cm 3.5   *2.6   14 cm Ant Lat Mall 40    Sural Anti Sensory (Lat Mall)  30.7C  Calf 3.8   7.4   Calf Lat Mall 37     Motor Left/Right Comparison  Stim Site L Lat (ms) R Lat (ms) L-R Lat (ms) L Amp (mV) R Amp (mV) L-R Amp (%) Site1 Site2 L Vel (m/s) R Vel (m/s) L-R Vel (m/s)  Fibular Motor (Ext Dig Brev)  29.9C  Ankle 4.1   3.7   B Fib Ankle 55    B Fib 10.1   3.5   Poplt B Fib 53    Poplt 12.0   3.9         Tibial Motor (Abd Hall Brev)  30.4C  Ankle 5.0   *1.8   Knee Ankle 52    Knee 11.5   6.5            Waveforms:

## 2019-02-03 NOTE — Progress Notes (Signed)
Amy Moon - 68 y.o. female MRN [id number]  Date of birth: 1950-07-20  Office Visit Note: Visit Date: 01/31/2019 PCP: [name], NP Referred by: [name], NP  Subjective: Chief Complaint  Patient presents with  . Left Leg - Tingling, Pain   HPI:  Amy Moon is a 68 y.o. female who comes in today At the request of Dr. Basil Dess for electrodiagnostic study of the left lower limb.  She reports 8 months of pain and tingling in the left leg in a fairly nondermatomal distribution and she is very difficult to trying to tell me where she feels it.  She actually does get some symptoms to the great toe which could indicate an L5 symptomatology.  She has had MRI of the lumbar spine which is reviewed below which does not show any focal nerve compression although there is a left-sided pars defect.  She reports that increased moving seems to help and medication seems to help.  She has had no prior lumbar surgery.  Her case is complicated by multiple medical problems including Parkinson's disease.  She denies right-sided complaints or specific trauma.  She does rate her pain as a 10 out of 10.  ROS Otherwise per HPI.  Assessment & Plan: Visit Diagnoses:  1. Paresthesia of skin     Plan:  Impression: Essentially NORMAL electrodiagnostic study of the left lower limb.  There is no significant electrodiagnostic evidence of nerve entrapment, brachial plexopathy or cervical radiculopathy.   **As you know, purely sensory or demyelinating radiculopathies and chemical radiculitis may not be detected with this particular electrodiagnostic study.  Recommendations: 1.  Follow-up with referring physician. 2.  Continue current management of symptoms.  Meds & Orders: No orders of the defined types were placed in this encounter.   Orders Placed This Encounter  Procedures  . NCV with EMG (electromyography)    Follow-up: Return for Basil Dess, M.D..   Procedures: No procedures  performed  EMG & NCV Findings: Evaluation of the left tibial motor and the left superficial fibular sensory nerves showed reduced amplitude (L1.8, L2.6 V).  All remaining nerves (as indicated in the following tables) were within normal limits.    All examined muscles (as indicated in the following table) showed no evidence of electrical instability.    Impression: Essentially NORMAL electrodiagnostic study of the left lower limb.  There is no significant electrodiagnostic evidence of nerve entrapment, brachial plexopathy or cervical radiculopathy.   **As you know, purely sensory or demyelinating radiculopathies and chemical radiculitis may not be detected with this particular electrodiagnostic study.  Recommendations: 1.  Follow-up with referring physician. 2.  Continue current management of symptoms.  ___________________________ Laurence Spates FAAPMR Board Certified, American Board of Physical Medicine and Rehabilitation    Nerve Conduction Studies Anti Sensory Summary Table   Stim Site NR Peak (ms) Norm Peak (ms) P-T Amp (V) Norm P-T Amp Site1 Site2 Delta-P (ms) Dist (cm) Vel (m/s) Norm Vel (m/s)  Left Saphenous Anti Sensory (Ant Med Mall)  30.5C  14cm    3.9 <4.4 6.3 >2 14cm Ant Med Mall 3.9 0.0  >32  Left Sup Fibular Anti Sensory (Ant Lat Mall)  30.5C  14 cm    3.5 <4.4 *2.6 >5.0 14 cm Ant Lat Mall 3.5 14.0 40 >32  Left Sural Anti Sensory (Lat Mall)  30.7C  Calf    3.8 <4.0 7.4 >5.0 Calf Lat Mall 3.8 14.0 37 >35   Motor Summary Table  Stim Site NR Onset (ms) Norm Onset (ms) O-P Amp (mV) Norm O-P Amp Site1 Site2 Delta-0 (ms) Dist (cm) Vel (m/s) Norm Vel (m/s)  Left Fibular Motor (Ext Dig Brev)  29.9C  Ankle    4.1 <6.1 3.7 >2.5 B Fib Ankle 6.0 33.0 55 >38  B Fib    10.1  3.5  Poplt B Fib 1.9 10.0 53 >40  Poplt    12.0  3.9         Left Tibial Motor (Abd Hall Brev)  30.4C  Ankle    5.0 <6.1 *1.8 >3.0 Knee Ankle 6.5 34.0 52 >35  Knee    11.5  6.5          EMG   Side  Muscle Nerve Root Ins Act Fibs Psw Amp Dur Poly Recrt Int Fraser Din Comment  Left AntTibialis Dp Br Peron L4-5 Nml Nml Nml Nml Nml 0 Nml Nml   Left Fibularis Longus  Sup Br Peron L5-S1 Nml Nml Nml Nml Nml 0 Nml Nml   Left MedGastroc Tibial S1-2 Nml Nml Nml Nml Nml 0 Nml Nml   Left VastusMed Femoral L2-4 Nml Nml Nml Nml Nml 0 Nml Nml   Left BicepsFemS Sciatic L5-S1 Nml Nml Nml Nml Nml 0 Nml Nml     Nerve Conduction Studies Anti Sensory Left/Right Comparison   Stim Site L Lat (ms) R Lat (ms) L-R Lat (ms) L Amp (V) R Amp (V) L-R Amp (%) Site1 Site2 L Vel (m/s) R Vel (m/s) L-R Vel (m/s)  Saphenous Anti Sensory (Ant Med Mall)  30.5C  14cm 3.9   6.3   14cm Ant Med Mall     Sup Fibular Anti Sensory (Ant Lat Mall)  30.5C  14 cm 3.5   *2.6   14 cm Ant Lat Mall 40    Sural Anti Sensory (Lat Mall)  30.7C  Calf 3.8   7.4   Calf Lat Mall 37     Motor Left/Right Comparison   Stim Site L Lat (ms) R Lat (ms) L-R Lat (ms) L Amp (mV) R Amp (mV) L-R Amp (%) Site1 Site2 L Vel (m/s) R Vel (m/s) L-R Vel (m/s)  Fibular Motor (Ext Dig Brev)  29.9C  Ankle 4.1   3.7   B Fib Ankle 55    B Fib 10.1   3.5   Poplt B Fib 53    Poplt 12.0   3.9         Tibial Motor (Abd Hall Brev)  30.4C  Ankle 5.0   *1.8   Knee Ankle 52    Knee 11.5   6.5            Waveforms:            Clinical History: MRI LUMBAR SPINE WITHOUT CONTRAST  TECHNIQUE: Multiplanar, multisequence MR imaging of the lumbar spine was performed. No intravenous contrast was administered.  COMPARISON:  Office radiographs 12/20/2018, thoracic MRI 10/23/2017 and abdominal CT 11/26/2015.  FINDINGS: Segmentation: Conventional anatomy assumed, with the last open disc space designated L5-S1.  Alignment: There is a mild convex left scoliosis centered at L2-3. The lateral alignment is anatomic.  Vertebrae: There is a chronic left-sided pars defect at L5. No evidence of acute fracture or suspicious marrow lesion. There are endplate  degenerative changes throughout the lumbar spine. The visualized sacroiliac joints appear unremarkable.  Conus medullaris: Extends to the L1 level and appears normal.  Paraspinal and other soft tissues: No significant paraspinal findings.  Disc levels:  T11-12: Mild disc bulging and small central disc protrusion. No spinal stenosis or nerve root encroachment.  T12-L1: Mild disc bulging. No spinal stenosis or nerve root encroachment.  L1-2: Chronic disc and endplate degeneration with Schmorl's node formation and a small chronic extruded disc fragment extending cephalad posterior to the L1 vertebral body. Appearance is similar to the previous studies, without mass effect on the conus medullaris or exiting nerve roots.  L2-3: Mild disc bulging and facet hypertrophy. No spinal stenosis or nerve root encroachment.  L3-4: Loss of disc height with disc bulging and endplate degeneration. Mild facet and ligamentous hypertrophy. No significant spinal stenosis or nerve root encroachment.  L4-5: Chronic loss of disc height with annular disc bulging and endplate osteophytes. The spinal canal is well-decompressed by a right-sided laminectomy. Mild bilateral facet hypertrophy. Mild narrowing of the foramina and left lateral recess without definite nerve root encroachment.  L5-S1: Chronic disc degeneration with annular disc bulging and endplate osteophytes. Mild bilateral facet hypertrophy and left sided pars defect noted. No spinal stenosis or nerve root encroachment.  IMPRESSION: 1. Chronic multilevel spondylosis without high-grade spinal stenosis or definite nerve root encroachment. 2. No acute findings or significant changes from available prior examinations.   Electronically Signed   By: Richardean Sale M.D.   On: 01/03/2019 08:03     Objective:  VS:  HT:    WT:   BMI:     BP:   HR: bpm  TEMP: ( )  RESP:  Physical Exam Vitals signs and nursing note  reviewed.  Musculoskeletal:        General: No swelling, tenderness, deformity or signs of injury.     Right lower leg: No edema.     Left lower leg: No edema.     Comments: Patient ambulates without aid without focal weakness or foot drop.  She has subjective decreased sensation or impaired sensation to light touch but it somewhat nondermatomal.  Again she has good EHL strength.  Negative Tinel's over the fibular heads.  She has a negative slump test.  She does have tight hamstring on the left compared to right.  Skin:    General: Skin is warm and dry.     Findings: No erythema, lesion or rash.  Neurological:     General: No focal deficit present.     Mental Status: She is oriented to person, place, and time.     Sensory: Sensory deficit present.     Motor: No weakness.     Gait: Gait normal.  Psychiatric:        Mood and Affect: Mood normal.        Behavior: Behavior normal.     Ortho Exam Imaging: No results found.

## 2019-02-05 ENCOUNTER — Ambulatory Visit: Payer: Medicare HMO | Admitting: Specialist

## 2019-02-05 ENCOUNTER — Telehealth: Payer: Self-pay | Admitting: *Deleted

## 2019-02-05 NOTE — Telephone Encounter (Signed)
R/s to 02/20/2019 @ 230pm

## 2019-02-11 ENCOUNTER — Other Ambulatory Visit: Payer: Self-pay | Admitting: Cardiology

## 2019-02-11 ENCOUNTER — Other Ambulatory Visit: Payer: Self-pay | Admitting: Specialist

## 2019-02-11 NOTE — Telephone Encounter (Signed)
Please advise 

## 2019-02-12 NOTE — Telephone Encounter (Signed)
Plavix and Ranolazine refills sent. Overdue for appt.

## 2019-02-20 ENCOUNTER — Ambulatory Visit (INDEPENDENT_AMBULATORY_CARE_PROVIDER_SITE_OTHER): Payer: Medicare HMO | Admitting: Specialist

## 2019-02-20 ENCOUNTER — Encounter: Payer: Self-pay | Admitting: Specialist

## 2019-02-20 VITALS — BP 151/82 | HR 91 | Ht 60.0 in | Wt 154.0 lb

## 2019-02-20 DIAGNOSIS — M4726 Other spondylosis with radiculopathy, lumbar region: Secondary | ICD-10-CM

## 2019-02-20 DIAGNOSIS — M48062 Spinal stenosis, lumbar region with neurogenic claudication: Secondary | ICD-10-CM

## 2019-02-20 DIAGNOSIS — M170 Bilateral primary osteoarthritis of knee: Secondary | ICD-10-CM

## 2019-02-20 NOTE — Patient Instructions (Addendum)
The main ways of treat osteoarthritis, that are found to be success. Weight loss helps to decrease pain. Exercise is important to maintaining cartilage and thickness and strengthening. NSAIDs like motrin, tylenol, alleve are meds decreasing the inflamation. Ice is okay  In afternoon and evening and hot shower in the am Please call a month ahead of follow up appt so we can order new Synvisc One material. Avoid bending, stooping and avoid lifting weights greater than 10 lbs. Avoid prolong standing and walking. Avoid frequent bending and stooping  No lifting greater than 10 lbs. May use ice or moist heat for pain. Weight loss is of benefit. Handicap license is approved. Dr. Romona Curls secretary/Assistant will call to arrange for epidural steroid injection

## 2019-02-20 NOTE — Progress Notes (Addendum)
Office Visit Note   Patient: Amy Moon           Date of Birth: [birth year]           MRN: [id number] Visit Date: 02/20/2019              Requested by: [name], NP [place],  [place] [id number] PCP: [name], NP   Assessment & Plan: Visit Diagnoses:  1. Other spondylosis with radiculopathy, lumbar region   2. Bilateral primary osteoarthritis of knee   3. Spinal stenosis of lumbar region with neurogenic claudication     Plan: The main ways of treat osteoarthritis, that are found to be success. Weight loss helps to decrease pain. Exercise is important to maintaining cartilage and thickness and strengthening. NSAIDs like motrin, tylenol, alleve are meds decreasing the inflamation. Ice is okay  In afternoon and evening and hot shower in the am Please call a month ahead of follow up appt so we can order new Synvisc One material. Avoid bending, stooping and avoid lifting weights greater than 10 lbs. Avoid prolong standing and walking. Avoid frequent bending and stooping  No lifting greater than 10 lbs. May use ice or moist heat for pain. Weight loss is of benefit. Handicap license is approved. Dr. Romona Curls secretary/Assistant will call to arrange for epidural steroid injection   Follow-Up Instructions: Return in about 4 weeks (around 03/20/2019).   Orders:  Orders Placed This Encounter  Procedures  . Ambulatory referral to Physical Medicine Rehab   No orders of the defined types were placed in this encounter.     Procedures: No procedures performed   Clinical Data: No additional findings.   Subjective: Chief Complaint  Patient presents with  . Lower Back - Follow-up    68 year old with left low back pain with radiation into the left leg. Pain is worse with sitting and lying down not any more so lying on the left side. Between a full turn and a half turn the pain in the left leg is worse. Left leg is weak in left knee extension, she  is using a cane with the left leg.    Review of Systems  Constitutional: Negative.   HENT: Negative.   Eyes: Negative.   Respiratory: Negative.   Cardiovascular: Negative.   Gastrointestinal: Negative.   Endocrine: Negative.   Genitourinary: Negative.   Musculoskeletal: Negative.   Skin: Negative.   Allergic/Immunologic: Negative.   Neurological: Negative.   Hematological: Negative.   Psychiatric/Behavioral: Negative.      Objective: Vital Signs: BP (!) 151/82 (BP Location: Left Arm, Patient Position: Sitting)   Pulse 91   Ht 5' (1.524 m)   Wt 154 lb (69.9 kg)   BMI 30.08 kg/m   Physical Exam Constitutional:      Appearance: She is well-developed.  HENT:     Head: Normocephalic and atraumatic.  Eyes:     Pupils: Pupils are equal, round, and reactive to light.  Neck:     Musculoskeletal: Normal range of motion and neck supple.  Pulmonary:     Effort: Pulmonary effort is normal.     Breath sounds: Normal breath sounds.  Abdominal:     General: Bowel sounds are normal.     Palpations: Abdomen is soft.  Skin:    General: Skin is warm and dry.  Neurological:     Mental Status: She is alert and oriented to person, place, and time.  Psychiatric:  Behavior: Behavior normal.        Thought Content: Thought content normal.        Judgment: Judgment normal.     Back Exam   Tenderness  The patient is experiencing tenderness in the lumbar.  Range of Motion  Extension: abnormal  Flexion: abnormal  Lateral bend right: normal  Lateral bend left: abnormal  Rotation right: normal  Rotation left: abnormal   Muscle Strength  Right Quadriceps:  4/5  Left Quadriceps:  5/5  Right Hamstrings:  5/5  Left Hamstrings:  5/5   Tests  Straight leg raise right: negative Straight leg raise left: negative  Other  Toe walk: normal Heel walk: normal Sensation: normal Gait: normal  Erythema: no back redness      Specialty Comments:  No specialty comments  available.  Imaging: No results found.   PMFS History: Patient Active Problem List   Diagnosis Date Noted  . Bronchitis 06/03/2018  . Encounter for Medicare annual wellness exam 05/09/2018  . Vitamin D deficiency 11/05/2017  . Encounter for hepatitis C virus screening test for high risk patient 11/05/2017  . Dysphagia 09/03/2017  . Thrombocytopenia ([place]) 09/03/2017  . Healthcare maintenance 07/30/2017  . Chronic pain syndrome 07/30/2017  . Chest pain 07/30/2017  . Parkinson's disease ([place]) 07/30/2017  . Hypertension 07/30/2017  . Severe obesity (BMI >= 40) ([place]) 05/15/2016  . Severe episode of recurrent major depressive disorder, without psychotic features ([place]) 05/15/2016  . Tremor of both hands 05/15/2016  . Irritant contact dermatitis 08/25/2015   Past Medical History:  Diagnosis Date  . Anemia   . Angina pectoris ([place])   . Arthritis   . Asthma    years ago  . Cancer (HCC)    squamous cell carcinoma, nose  . Cataract   . Chronic kidney disease    possibly per pt  . Cirrhosis ([place])   . Clotting disorder (HCC)    platelets are low  . Depression   . Drug-induced low platelet count   . Duodenal ulcer   . Enlarged thyroid   . Family history of adverse reaction to anesthesia    father had difficulty waking up and breathing on his own after surgery  . GERD (gastroesophageal reflux disease)   . Granulomatous disease ([place])   . Hepatic disease   . Hiatal hernia   . History of kidney stones   . Hypertension   . Low vitamin D level   . Neuropathy   . Osteopenia   . Osteoporosis   . Parkinson's disease ([place])   . Pneumonia   . Portal hypertension ([place])   . Splenomegaly a    Family History  Problem Relation Age of Onset  . Cancer Mother        ovarian  . Hyperlipidemia Mother   . Alzheimer's disease Mother   . Arthritis Mother        rheumatoid  . Cancer Father        lung  . Parkinson's disease Father   . Alzheimer's disease Father   . ALS Sister   .  Cancer Maternal Aunt        pancreatic, lung, liver,breast  . Breast cancer Maternal Aunt   . Cancer Maternal Uncle        pancreatic, liver, brain  . Heart attack Paternal Aunt   . Stroke Paternal Aunt   . Heart attack Paternal Uncle   . Stroke Paternal Grandmother   . Heart attack Paternal Grandfather   .  Arthritis Sister        rheumatoid  . Huntington's disease Daughter        presumed inherited from father per patient  . Colon cancer Maternal Uncle   . Breast cancer Maternal Aunt   . Breast cancer Maternal Aunt     Past Surgical History:  Procedure Laterality Date  . ABDOMINAL HYSTERECTOMY    . APPENDECTOMY    . BACK SURGERY     thoracic-fractured   . BACK SURGERY     lumbar - fractured  . BREAST LUMPECTOMY WITH RADIOACTIVE SEED LOCALIZATION Right 08/01/2018   Procedure: RADIOACTIVE SEED GUIDED RIGHT BREAST LUMPECTOMY;  Surgeon: Coralie Keens, MD;  Location: [facility];  Service: General;  Laterality: Right;  . ESOPHAGOGASTRODUODENOSCOPY  05/20/2008   Mild to moderate esophagitis. Otherwise normal EGD.   Marland Kitchen EYE SURGERY Left    cataract surgery with lens implant  . GASTROSTOMY     peptic ulcer - peg tube placement   Social History   Occupational History  . Not on file  Tobacco Use  . Smoking status: Former Smoker    Packs/day: 1.00    Years: 22.00    Pack years: 22.00    Types: Cigarettes    Quit date: 06/26/1986    Years since quitting: 32.6  . Smokeless tobacco: Never Used  Substance and Sexual Activity  . Alcohol use: No  . Drug use: No  . Sexual activity: Not Currently    Birth control/protection: None

## 2019-03-04 ENCOUNTER — Other Ambulatory Visit: Payer: Self-pay | Admitting: Specialist

## 2019-03-11 ENCOUNTER — Ambulatory Visit (INDEPENDENT_AMBULATORY_CARE_PROVIDER_SITE_OTHER): Payer: Medicare HMO | Admitting: Physical Medicine and Rehabilitation

## 2019-03-11 ENCOUNTER — Telehealth: Payer: Self-pay | Admitting: *Deleted

## 2019-03-11 ENCOUNTER — Encounter: Payer: Self-pay | Admitting: Physical Medicine and Rehabilitation

## 2019-03-11 ENCOUNTER — Ambulatory Visit: Payer: Self-pay

## 2019-03-11 VITALS — BP 136/79 | HR 93

## 2019-03-11 DIAGNOSIS — M5416 Radiculopathy, lumbar region: Secondary | ICD-10-CM

## 2019-03-11 MED ORDER — BETAMETHASONE SOD PHOS & ACET 6 (3-3) MG/ML IJ SUSP
12.0000 mg | Freq: Once | INTRAMUSCULAR | Status: AC
  Administered 2019-03-11: 12 mg

## 2019-03-11 NOTE — Progress Notes (Signed)
 .  Numeric Pain Rating Scale and Functional Assessment Average Pain 8   In the last MONTH (on 0-10 scale) has pain interfered with the following?  1. General activity like being  able to carry out your everyday physical activities such as walking, climbing stairs, carrying groceries, or moving a chair?  Rating(9)   +Driver, +BT(plavix, started it last month), -Dye Allergies.

## 2019-03-11 NOTE — Telephone Encounter (Signed)
[id number]. Approved for [id number] Auth No / Request ID  Status Auto-Approved Decision Approved Effective Date 03/11/2019 Expiration Date 04/25/2019

## 2019-03-20 ENCOUNTER — Other Ambulatory Visit: Payer: Self-pay | Admitting: Specialist

## 2019-03-20 ENCOUNTER — Other Ambulatory Visit: Payer: Self-pay | Admitting: Cardiology

## 2019-03-20 ENCOUNTER — Ambulatory Visit (INDEPENDENT_AMBULATORY_CARE_PROVIDER_SITE_OTHER): Payer: Medicare HMO | Admitting: Specialist

## 2019-03-20 ENCOUNTER — Encounter: Payer: Self-pay | Admitting: Specialist

## 2019-03-20 VITALS — BP 142/76 | HR 83 | Ht 60.0 in | Wt 154.0 lb

## 2019-03-20 DIAGNOSIS — M1712 Unilateral primary osteoarthritis, left knee: Secondary | ICD-10-CM | POA: Diagnosis not present

## 2019-03-20 DIAGNOSIS — M51369 Other intervertebral disc degeneration, lumbar region without mention of lumbar back pain or lower extremity pain: Secondary | ICD-10-CM

## 2019-03-20 DIAGNOSIS — M47816 Spondylosis without myelopathy or radiculopathy, lumbar region: Secondary | ICD-10-CM

## 2019-03-20 DIAGNOSIS — M5136 Other intervertebral disc degeneration, lumbar region: Secondary | ICD-10-CM | POA: Diagnosis not present

## 2019-03-20 DIAGNOSIS — M419 Scoliosis, unspecified: Secondary | ICD-10-CM | POA: Diagnosis not present

## 2019-03-20 NOTE — Progress Notes (Signed)
Office Visit Note   Patient: Amy Moon           Date of Birth: 02-Mar-1951           MRN: [id number] Visit Date: 03/20/2019              Requested by: [name], NP [place],  [place] [address] PCP: [name], NP   Assessment & Plan: Visit Diagnoses:  1. Unilateral primary osteoarthritis, left knee   2. Degenerative disc disease, lumbar   3. Spondylosis without myelopathy or radiculopathy, lumbar region   4. Scoliosis of thoracolumbar spine, unspecified scoliosis type     Plan: Avoid frequent bending and stooping  No lifting greater than 10 lbs. May use ice or moist heat for pain. Weight loss is of benefit. Best medication for lumbar disc disease is arthritis medications like motrin, celebrex and naprosyn. Exercise is important to improve your indurance and does allow people to function better inspite of back pain.  Tylenol ES one every 6-8 hours for pain and inflamation. Call if you are having left knee pain and need to consider a synvisc injection into the left knee.  Follow-Up Instructions: No follow-ups on file.   Orders:  No orders of the defined types were placed in this encounter.  No orders of the defined types were placed in this encounter.     Procedures: No procedures performed   Clinical Data: No additional findings.   Subjective: Chief Complaint  Patient presents with  . Lower Back - Follow-up    Had left L% TF with Dr. Ernestina Patches on 03/11/2019, and did not get any relief.    68 year old female with back pain and radiation into the left leg with pain that starts with a tingling sensation and then radiates into the left foot. She also has osteoarthritis of the left knee. She had left leg EMG and NCV and also a left L5-S1 transforamenal ESI without much help. She has some bladder difficulties and has been taking hydrocodone and gabapentin for pain. Takes about 1-2 hydrocodone per day to relieve the pain. She is having pain  that is a 10 and she is uncomfortable and having difficulty sleeping only got 3 hours of sleep over the last 24 hours.    Review of Systems   Objective: Vital Signs: BP (!) 142/76 (BP Location: Left Arm, Patient Position: Sitting)   Pulse 83   Ht 5' (1.524 m)   Wt 154 lb (69.9 kg)   BMI 30.08 kg/m   Physical Exam Constitutional:      Appearance: She is well-developed.  HENT:     Head: Normocephalic and atraumatic.  Eyes:     Pupils: Pupils are equal, round, and reactive to light.  Neck:     Musculoskeletal: Normal range of motion and neck supple.  Pulmonary:     Effort: Pulmonary effort is normal.     Breath sounds: Normal breath sounds.  Abdominal:     General: Bowel sounds are normal.     Palpations: Abdomen is soft.  Skin:    General: Skin is warm and dry.  Neurological:     Mental Status: She is alert and oriented to person, place, and time.  Psychiatric:        Behavior: Behavior normal.        Thought Content: Thought content normal.        Judgment: Judgment normal.     Back Exam  Range of Motion  Extension: abnormal  Flexion: abnormal  Lateral bend right: abnormal  Lateral bend left: abnormal  Rotation right: abnormal  Rotation left: abnormal   Muscle Strength  Right Quadriceps:  5/5  Left Quadriceps:  5/5  Right Hamstrings:  5/5  Left Hamstrings:  5/5   Tests  Straight leg raise right: negative Straight leg raise left: negative  Reflexes  Patellar: 2/4 Achilles: 2/4 Biceps: 2/4 Babinski's sign: normal   Other  Toe walk: normal Heel walk: normal Sensation: normal Gait: normal  Erythema: no back redness Scars: absent      Specialty Comments:  No specialty comments available.  Imaging: No results found.   PMFS History: Patient Active Problem List   Diagnosis Date Noted  . Bronchitis 06/03/2018  . Encounter for Medicare annual wellness exam 05/09/2018  . Vitamin D deficiency 11/05/2017  . Encounter for hepatitis C virus  screening test for high risk patient 11/05/2017  . Dysphagia 09/03/2017  . Thrombocytopenia ([place]) 09/03/2017  . Healthcare maintenance 07/30/2017  . Chronic pain syndrome 07/30/2017  . Chest pain 07/30/2017  . Parkinson's disease ([place]) 07/30/2017  . Hypertension 07/30/2017  . Severe obesity (BMI >= 40) ([place]) 05/15/2016  . Severe episode of recurrent major depressive disorder, without psychotic features ([place]) 05/15/2016  . Tremor of both hands 05/15/2016  . Irritant contact dermatitis 08/25/2015   Past Medical History:  Diagnosis Date  . Anemia   . Angina pectoris ([place])   . Arthritis   . Asthma    years ago  . Cancer (HCC)    squamous cell carcinoma, nose  . Cataract   . Chronic kidney disease    possibly per pt  . Cirrhosis ([place])   . Clotting disorder (HCC)    platelets are low  . Depression   . Drug-induced low platelet count   . Duodenal ulcer   . Enlarged thyroid   . Family history of adverse reaction to anesthesia    father had difficulty waking up and breathing on his own after surgery  . GERD (gastroesophageal reflux disease)   . Granulomatous disease ([place])   . Hepatic disease   . Hiatal hernia   . History of kidney stones   . Hypertension   . Low vitamin D level   . Neuropathy   . Osteopenia   . Osteoporosis   . Parkinson's disease ([place])   . Pneumonia   . Portal hypertension ([place])   . Splenomegaly a    Family History  Problem Relation Age of Onset  . Cancer Mother        ovarian  . Hyperlipidemia Mother   . Alzheimer's disease Mother   . Arthritis Mother        rheumatoid  . Cancer Father        lung  . Parkinson's disease Father   . Alzheimer's disease Father   . ALS Sister   . Cancer Maternal Aunt        pancreatic, lung, liver,breast  . Breast cancer Maternal Aunt   . Cancer Maternal Uncle        pancreatic, liver, brain  . Heart attack Paternal Aunt   . Stroke Paternal Aunt   . Heart attack Paternal Uncle   . Stroke Paternal Grandmother    . Heart attack Paternal Grandfather   . Arthritis Sister        rheumatoid  . Huntington's disease Daughter        presumed inherited from father per patient  .  Colon cancer Maternal Uncle   . Breast cancer Maternal Aunt   . Breast cancer Maternal Aunt     Past Surgical History:  Procedure Laterality Date  . ABDOMINAL HYSTERECTOMY    . APPENDECTOMY    . BACK SURGERY     thoracic-fractured   . BACK SURGERY     lumbar - fractured  . BREAST LUMPECTOMY WITH RADIOACTIVE SEED LOCALIZATION Right 08/01/2018   Procedure: RADIOACTIVE SEED GUIDED RIGHT BREAST LUMPECTOMY;  Surgeon: Coralie Keens, MD;  Location: [facility];  Service: General;  Laterality: Right;  . ESOPHAGOGASTRODUODENOSCOPY  05/20/2008   Mild to moderate esophagitis. Otherwise normal EGD.   Marland Kitchen EYE SURGERY Left    cataract surgery with lens implant  . GASTROSTOMY     peptic ulcer - peg tube placement   Social History   Occupational History  . Not on file  Tobacco Use  . Smoking status: Former Smoker    Packs/day: 1.00    Years: 22.00    Pack years: 22.00    Types: Cigarettes    Quit date: 06/26/1986    Years since quitting: 32.7  . Smokeless tobacco: Never Used  Substance and Sexual Activity  . Alcohol use: No  . Drug use: No  . Sexual activity: Not Currently    Birth control/protection: None

## 2019-03-20 NOTE — Patient Instructions (Addendum)
  Avoid frequent bending and stooping  No lifting greater than 10 lbs. May use ice or moist heat for pain. Weight loss is of benefit. Best medication for lumbar disc disease is arthritis medications like motrin, celebrex and naprosyn. Exercise is important to improve your indurance and does allow people to function better inspite of back pain.  Tylenol ES one every 6-8 hours for pain and inflamation. Call if you are having left knee pain and need to consider a synvisc injection into the left knee.

## 2019-03-30 NOTE — Progress Notes (Signed)
Amy Moon - 68 y.o. female MRN [id number]  Date of birth: [birth year]  Office Visit Note: Visit Date: 03/11/2019 PCP: [name], NP Referred by: [name], NP  Subjective: Chief Complaint  Patient presents with  . Lower Back - Pain  . Left Leg - Pain   HPI:  MEREDETH MELLEMA is a 68 y.o. female who comes in today For planned left L4 transforaminal epidural steroid injection and at the request of Dr. Basil Dess for left radicular leg pain not resolved with with conservative care.  ROS Otherwise per HPI.  Assessment & Plan: Visit Diagnoses:  1. Lumbar radiculopathy     Plan: No additional findings.   Meds & Orders:  Meds ordered this encounter  Medications  . betamethasone acetate-betamethasone sodium phosphate (CELESTONE) injection 12 mg    Orders Placed This Encounter  Procedures  . XR C-ARM NO REPORT  . Epidural Steroid injection    Follow-up: Return if symptoms worsen or fail to improve.   Procedures: No procedures performed  Lumbosacral Transforaminal Epidural Steroid Injection - Sub-Pedicular Approach with Fluoroscopic Guidance  Patient: KENDAL JERVEY      Date of Birth: [birth year] MRN: [id number] PCP: [name], NP      Visit Date: 03/11/2019   Universal Protocol:    Date/Time: 03/11/2019  Consent Given By: the patient  Position: PRONE  Additional Comments: Vital signs were monitored before and after the procedure. Patient was prepped and draped in the usual sterile fashion. The correct patient, procedure, and site was verified.   Injection Procedure Details:  Procedure Site One Meds Administered:  Meds ordered this encounter  Medications  . betamethasone acetate-betamethasone sodium phosphate (CELESTONE) injection 12 mg    Laterality: Left  Location/Site:  L4-L5  Needle size: 22 G  Needle type: Spinal  Needle Placement: Transforaminal  Findings:    -Comments: Excellent flow of contrast along the nerve and into  the epidural space.  Procedure Details: After squaring off the end-plates to get a true AP view, the C-arm was positioned so that an oblique view of the foramen as noted above was visualized. The target area is just inferior to the "nose of the scotty dog" or sub pedicular. The soft tissues overlying this structure were infiltrated with 2-3 ml. of 1% Lidocaine without Epinephrine.  The spinal needle was inserted toward the target using a "trajectory" view along the fluoroscope beam.  Under AP and lateral visualization, the needle was advanced so it did not puncture dura and was located close the 6 O'Clock position of the pedical in AP tracterory. Biplanar projections were used to confirm position. Aspiration was confirmed to be negative for CSF and/or blood. A 1-2 ml. volume of Isovue-250 was injected and flow of contrast was noted at each level. Radiographs were obtained for documentation purposes.   After attaining the desired flow of contrast documented above, a 0.5 to 1.0 ml test dose of 0.25% Marcaine was injected into each respective transforaminal space.  The patient was observed for 90 seconds post injection.  After no sensory deficits were reported, and normal lower extremity motor function was noted,   the above injectate was administered so that equal amounts of the injectate were placed at each foramen (level) into the transforaminal epidural space.   Additional Comments:  The patient tolerated the procedure well Dressing: 2 x 2 sterile gauze and Band-Aid    Post-procedure details: Patient was observed during the procedure. Post-procedure instructions were reviewed.  Patient left the clinic in stable condition.    Clinical History: MRI LUMBAR SPINE WITHOUT CONTRAST  TECHNIQUE: Multiplanar, multisequence MR imaging of the lumbar spine was performed. No intravenous contrast was administered.  COMPARISON:  Office radiographs 12/20/2018, thoracic MRI 10/23/2017 and abdominal  CT 11/26/2015.  FINDINGS: Segmentation: Conventional anatomy assumed, with the last open disc space designated L5-S1.  Alignment: There is a mild convex left scoliosis centered at L2-3. The lateral alignment is anatomic.  Vertebrae: There is a chronic left-sided pars defect at L5. No evidence of acute fracture or suspicious marrow lesion. There are endplate degenerative changes throughout the lumbar spine. The visualized sacroiliac joints appear unremarkable.  Conus medullaris: Extends to the L1 level and appears normal.  Paraspinal and other soft tissues: No significant paraspinal findings.  Disc levels:  T11-12: Mild disc bulging and small central disc protrusion. No spinal stenosis or nerve root encroachment.  T12-L1: Mild disc bulging. No spinal stenosis or nerve root encroachment.  L1-2: Chronic disc and endplate degeneration with Schmorl's node formation and a small chronic extruded disc fragment extending cephalad posterior to the L1 vertebral body. Appearance is similar to the previous studies, without mass effect on the conus medullaris or exiting nerve roots.  L2-3: Mild disc bulging and facet hypertrophy. No spinal stenosis or nerve root encroachment.  L3-4: Loss of disc height with disc bulging and endplate degeneration. Mild facet and ligamentous hypertrophy. No significant spinal stenosis or nerve root encroachment.  L4-5: Chronic loss of disc height with annular disc bulging and endplate osteophytes. The spinal canal is well-decompressed by a right-sided laminectomy. Mild bilateral facet hypertrophy. Mild narrowing of the foramina and left lateral recess without definite nerve root encroachment.  L5-S1: Chronic disc degeneration with annular disc bulging and endplate osteophytes. Mild bilateral facet hypertrophy and left sided pars defect noted. No spinal stenosis or nerve root encroachment.  IMPRESSION: 1. Chronic multilevel  spondylosis without high-grade spinal stenosis or definite nerve root encroachment. 2. No acute findings or significant changes from available prior examinations.   Electronically Signed   By: Richardean Sale M.D.   On: 01/03/2019 08:03     Objective:  VS:  HT:    WT:   BMI:     BP:136/79  HR:93bpm  TEMP: ( )  RESP:  Physical Exam  Ortho Exam Imaging: No results found.

## 2019-03-30 NOTE — Procedures (Signed)
Lumbosacral Transforaminal Epidural Steroid Injection - Sub-Pedicular Approach with Fluoroscopic Guidance  Patient: Amy Moon      Date of Birth: 04-20-1951 MRN: [id number] PCP: [name], NP      Visit Date: 03/11/2019   Universal Protocol:    Date/Time: 03/11/2019  Consent Given By: the patient  Position: PRONE  Additional Comments: Vital signs were monitored before and after the procedure. Patient was prepped and draped in the usual sterile fashion. The correct patient, procedure, and site was verified.   Injection Procedure Details:  Procedure Site One Meds Administered:  Meds ordered this encounter  Medications  . betamethasone acetate-betamethasone sodium phosphate (CELESTONE) injection 12 mg    Laterality: Left  Location/Site:  L4-L5  Needle size: 22 G  Needle type: Spinal  Needle Placement: Transforaminal  Findings:    -Comments: Excellent flow of contrast along the nerve and into the epidural space.  Procedure Details: After squaring off the end-plates to get a true AP view, the C-arm was positioned so that an oblique view of the foramen as noted above was visualized. The target area is just inferior to the "nose of the scotty dog" or sub pedicular. The soft tissues overlying this structure were infiltrated with 2-3 ml. of 1% Lidocaine without Epinephrine.  The spinal needle was inserted toward the target using a "trajectory" view along the fluoroscope beam.  Under AP and lateral visualization, the needle was advanced so it did not puncture dura and was located close the 6 O'Clock position of the pedical in AP tracterory. Biplanar projections were used to confirm position. Aspiration was confirmed to be negative for CSF and/or blood. A 1-2 ml. volume of Isovue-250 was injected and flow of contrast was noted at each level. Radiographs were obtained for documentation purposes.   After attaining the desired flow of contrast documented above, a 0.5 to 1.0  ml test dose of 0.25% Marcaine was injected into each respective transforaminal space.  The patient was observed for 90 seconds post injection.  After no sensory deficits were reported, and normal lower extremity motor function was noted,   the above injectate was administered so that equal amounts of the injectate were placed at each foramen (level) into the transforaminal epidural space.   Additional Comments:  The patient tolerated the procedure well Dressing: 2 x 2 sterile gauze and Band-Aid    Post-procedure details: Patient was observed during the procedure. Post-procedure instructions were reviewed.  Patient left the clinic in stable condition.

## 2019-04-07 ENCOUNTER — Other Ambulatory Visit: Payer: Self-pay | Admitting: Specialist

## 2019-04-08 ENCOUNTER — Telehealth: Payer: Self-pay | Admitting: Adult Health

## 2019-04-08 NOTE — Telephone Encounter (Signed)
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  ---Patient wants to know if Valetta Fuller can order testing for her or does she need to have a blood specialist doctor that can order it.  ----Forwarding message to medical assistant for review w/provider .  --Pt also worried she hasn't seen Valetta Fuller since Pandemic started & knows needs OV w/poss labwork (told her provider will need to review her last office notes to decide & we will let her know if OV or Telehealth applicable . --glh

## 2019-04-09 NOTE — Telephone Encounter (Signed)
Please advise.  T. Jaylyn Iyer, CMA 

## 2019-04-11 ENCOUNTER — Encounter: Payer: Self-pay | Admitting: Family

## 2019-04-11 ENCOUNTER — Ambulatory Visit (INDEPENDENT_AMBULATORY_CARE_PROVIDER_SITE_OTHER): Payer: Medicare HMO | Admitting: Family

## 2019-04-11 ENCOUNTER — Other Ambulatory Visit: Payer: Self-pay

## 2019-04-11 VITALS — BP 112/70 | HR 93 | Resp 18 | Ht 60.0 in | Wt 165.5 lb

## 2019-04-11 DIAGNOSIS — R072 Precordial pain: Secondary | ICD-10-CM | POA: Diagnosis not present

## 2019-04-11 DIAGNOSIS — R0600 Dyspnea, unspecified: Secondary | ICD-10-CM | POA: Diagnosis not present

## 2019-04-11 DIAGNOSIS — K219 Gastro-esophageal reflux disease without esophagitis: Secondary | ICD-10-CM

## 2019-04-11 DIAGNOSIS — R0609 Other forms of dyspnea: Secondary | ICD-10-CM

## 2019-04-11 MED ORDER — OMEPRAZOLE 20 MG PO CPDR: DELAYED_RELEASE_CAPSULE | ORAL | 3 refills | Status: DC

## 2019-04-11 MED ORDER — CLOPIDOGREL BISULFATE 75 MG PO TABS: 75.0000 mg | ORAL_TABLET | Freq: Every day | ORAL | 2 refills | Status: DC

## 2019-04-11 MED ORDER — RANOLAZINE ER 500 MG PO TB12: 500.0000 mg | ORAL_TABLET | Freq: Two times a day (BID) | ORAL | 2 refills | Status: DC

## 2019-04-11 NOTE — Progress Notes (Signed)
Office Visit    Patient Name: Amy Moon Date of Encounter: 04/11/2019  Primary Care Provider:  [name], NP Primary Cardiologist:  Jenne Campus, MD Electrophysiologist:  None   Chief Complaint    Amy Moon is a 68 y.o. female with a hx of spondylosis, cervicalgia, precordial chest pain presents today for follow up of cardiac conditions and medication management.  Past Medical History    Past Medical History:  Diagnosis Date  . Anemia   . Angina pectoris ([place])   . Arthritis   . Asthma    years ago  . Cancer (HCC)    squamous cell carcinoma, nose  . Cataract   . Chronic kidney disease    possibly per pt  . Cirrhosis ([place])   . Clotting disorder (HCC)    platelets are low  . Depression   . Drug-induced low platelet count   . Duodenal ulcer   . Enlarged thyroid   . Family history of adverse reaction to anesthesia    father had difficulty waking up and breathing on his own after surgery  . GERD (gastroesophageal reflux disease)   . Granulomatous disease ([place])   . Hepatic disease   . Hiatal hernia   . History of kidney stones   . Hypertension   . Low vitamin D level   . Neuropathy   . Osteopenia   . Osteoporosis   . Parkinson's disease ([place])   . Pneumonia   . Portal hypertension ([place])   . Splenomegaly a   Past Surgical History:  Procedure Laterality Date  . ABDOMINAL HYSTERECTOMY    . APPENDECTOMY    . BACK SURGERY     thoracic-fractured   . BACK SURGERY     lumbar - fractured  . BREAST LUMPECTOMY WITH RADIOACTIVE SEED LOCALIZATION Right 08/01/2018   Procedure: RADIOACTIVE SEED GUIDED RIGHT BREAST LUMPECTOMY;  Surgeon: Coralie Keens, MD;  Location: [facility];  Service: General;  Laterality: Right;  . ESOPHAGOGASTRODUODENOSCOPY  05/20/2008   Mild to moderate esophagitis. Otherwise normal EGD.   Marland Kitchen EYE SURGERY Left    cataract surgery with lens implant  . GASTROSTOMY     peptic ulcer - peg tube placement    Allergies  Allergies   Allergen Reactions  . Nsaids Other (See Comments)    History of bleeding ulcers  . Latex Hives and Rash  . Sinemet [Carbidopa W-Levodopa] Nausea And Vomiting  . Inderal [Propranolol] Other (See Comments)    Unknown  . Indomethacin Other (See Comments)    Unknown  . Penicillin G Other (See Comments)    Unknown  . Pneumococcal Vaccines Other (See Comments)    Caused "pneumonia"  . Scopolamine Other (See Comments)    The patch, caused her to pass out / change in mental status  . Tape Other (See Comments)    Unknown    History of Present Illness    Amy Moon is a 68 y.o. female with a hx of cervical spondylosis, cervicalgia, precordial chest pain, GERD last seen by Dr. Agustin Cree 02/2018.  07/2018 had radioactive seed guided R breast lumpectomy. Follows closely with orthopedics for cervical spondylosis and cervicalgia.  She reports feeling overall well. The only time she recalls chest pain is when she had to stop her Plavix 07/2018 prior to surgery. No recurrent episodes since. She has no SOB at rest, no palpitations, no pre-syncope. Does not check BP routinely at home.  Tells me she "holds onto fluid". She notices  some intermittent swelling in her lower extremities. We discussed avoidance of salt, elevating lower extremities, and compression stockings. She has been wearing compression stockings and does notice her legs are better when she sits with her feet up.  Reports recent stress as her newest great granddaughter born in August has a genetic mutation that has made her development and feeding difficult. She does enjoy spending time gardening with her other 53 year old granddaughter and is excited to get to hold her newest granddaughter when she is able to be discharged from the hospital.   EKGs/Labs/Other Studies Reviewed:   The following studies were reviewed today: Lexiscan 11/2017  Nuclear stress EF: 77%.  There was no ST segment deviation noted during stress.  This is a  low risk study.  The left ventricular ejection fraction is hyperdynamic (>65%).   1. EF 77%, normal wall motion.  2. Fixed small, mild basal to mid inferior perfusion defect.  Given normal wall motion, this may be soft tissue attenuation.  No evidence for ischemia.    Low risk study.   Echo 10/2017 ------------------------------------------------------------------- Study Conclusions   - Left ventricle: The cavity size was normal. Systolic function was   normal. The estimated ejection fraction was in the range of 55%   to 60%. Wall motion was normal; there were no regional wall   motion abnormalities. Left ventricular diastolic function   parameters were normal.   Impressions:   - Normal LVEF.   Trace TR.   EKG:  EKG is ordered today.  The ekg ordered today demonstrates SR rate 92 bpm with no acute ST/T wave changes.   Recent Labs: 07/01/2018: ALT 13; TSH 0.80 07/25/2018: BUN 6; Creatinine, Ser 0.69; Hemoglobin 12.5; Platelets 122; Potassium 3.8; Sodium 140  Recent Lipid Panel    Component Value Date/Time   CHOL 164 08/06/2017 0907   TRIG 72 08/06/2017 0907   HDL 64 08/06/2017 0907   CHOLHDL 2.6 08/06/2017 0907   LDLCALC 86 08/06/2017 0907    Home Medications   Current Meds  Medication Sig  . acetaminophen (TYLENOL) 650 MG CR tablet Take 650 mg by mouth every 6 (six) hours as needed for pain.  . bisacodyl (DULCOLAX) 5 MG EC tablet Take 15 mg by mouth at bedtime as needed for moderate constipation.  . clopidogrel (PLAVIX) 75 MG tablet Take 1 tablet (75 mg total) by mouth daily.  . cyclobenzaprine (FLEXERIL) 10 MG tablet TAKE 1 TABLET BY MOUTH EVERY 8 HOURS AS NEEDED  . diphenhydrAMINE (BENADRYL) 50 MG tablet Take 25 mg by mouth daily as needed for allergies.   Marland Kitchen gabapentin (NEURONTIN) 100 MG capsule Take one capsule q AM and q Noon. (Patient taking differently: Take 100 mg by mouth 3 (three) times daily. )  . gabapentin (NEURONTIN) 300 MG capsule Take 2 capsules (600 mg  total) by mouth at bedtime.  Marland Kitchen HYDROcodone-acetaminophen (NORCO/VICODIN) 5-325 MG tablet TAKE 1 TABLET BY MOUTH EVERY 6 HOURS AS NEEDED FOR MODERATE PAIN.  . nitroGLYCERIN (NITROSTAT) 0.4 MG SL tablet Place 0.4 mg under the tongue every 5 (five) minutes as needed for chest pain.   Marland Kitchen omeprazole (PRILOSEC) 20 MG capsule TAKE 1 CAPSULE BY MOUTH 2 TIMES DAILY.  . ranolazine (RANEXA) 500 MG 12 hr tablet Take 1 tablet (500 mg total) by mouth 2 (two) times daily.  . Vitamin D, Ergocalciferol, (DRISDOL) 50000 units CAPS capsule Take 1 capsule (50,000 Units total) by mouth every 7 (seven) days. (Patient taking differently: Take 50,000 Units by  mouth every Friday. )  . [DISCONTINUED] clopidogrel (PLAVIX) 75 MG tablet TAKE 1 TABLET BY MOUTH DAILY.  . [DISCONTINUED] omeprazole (PRILOSEC) 20 MG capsule TAKE 1 CAPSULE BY MOUTH 2 TIMES DAILY.  . [DISCONTINUED] ranolazine (RANEXA) 500 MG 12 hr tablet TAKE 1 TABLET BY MOUTH 2 TIMES DAILY.      Review of Systems    Review of Systems  Constitution: Negative for chills, fever and malaise/fatigue.  Cardiovascular: Positive for dyspnea on exertion and leg swelling. Negative for chest pain, near-syncope, orthopnea and palpitations.  Respiratory: Negative for cough, shortness of breath and wheezing.   Musculoskeletal: Positive for back pain (chronic).  Gastrointestinal: Negative for nausea and vomiting.  Neurological: Negative for dizziness, light-headedness and weakness.   All other systems reviewed and are otherwise negative except as noted above.  Physical Exam    VS:  BP 112/70 (BP Location: Left Arm, Patient Position: Sitting, Cuff Size: Normal)   Pulse 93   Resp 18   Ht 5' (1.524 m)   Wt 165 lb 8 oz (75.1 kg)   SpO2 97%   BMI 32.32 kg/m  , BMI Body mass index is 32.32 kg/m. GEN: Well nourished, overweight, well developed, in no acute distress. HEENT: normal. Neck: Supple, no JVD, carotid bruits, or masses. Cardiac: RRR, no murmurs, rubs, or  gallops. No clubbing, cyanosis, edema.  Radials/DP/PT 2+ and equal bilaterally.  Respiratory:  Respirations regular and unlabored, clear to auscultation bilaterally. GI: Soft, nontender, nondistended, BS + x 4. MS: No deformity or atrophy. Skin: Warm and dry, no rash. Neuro:  Strength and sensation are intact. Psych: Normal affect.  Accessory Clinical Findings    ECG personally reviewed by me today - SR rate 92 bpm with no acute ST/T wave changes - no acute changes.  Assessment & Plan    1. Precordial chest pain  Known hx. Previous stress test low risk 11/2017. Since that time one recurrence of chest pain when she had to be off her cardiac medications prior to surgery. She was previously started on Plavix as she is intolerant of aspirin, continue for cardioprotective benefit. Continue Ranexa 500mg  BID as she has benefited with resolution of symptoms. Presentation is very consistent with INOCA (ischemia with no coronary artery disease). No indication for further ischemic evaluation as her EKG today is without acute ST/T wave changes and she has only had the 1 recurrent episode of chest pain.   2. DOE - Reports this is at her baseline. Echo 10/2017 without evidence of heart failure. Stress test 11/2017 without evidence of CAD. Likely etiology deconditioning. We discussed doing as much exercise as she can in the setting of her orthopedic issues.   3. LE edema - Echo 10/2017 with EF 55-60%, normal LV diastolic function. Discussed etiology of venous insufficiency. Trace pedal edema on exam today. Recommend continue compression stockings. Recommend low salt diet. Recommend elevating lower extremities.   4. GERD - Follows with Dr. Lyndel Safe. Presently takes Omeprazole BID at his Rx. Will inquire about switching to Pantoprazole to prevent interaction with Plavix.   Recommend annual check of lipid profile. She plans to contact her PCP for an upcoming visit.  Disposition: Follow up in 6 month(s) with Dr.  Moshe Cipro, NP 04/11/2019, 3:30 PM

## 2019-04-11 NOTE — Patient Instructions (Addendum)
Medication Instructions:   Your physician has recommended you make the following change in your medication:   STOP Omeprazole START Pantoprazole  This is to prevent the interaction between Omeprazole and Plavix.   *If you need a refill on your cardiac medications before your next appointment, please call your pharmacy*  Lab Work: No lab work today.   If you have labs (blood work) drawn today and your tests are completely normal, you will receive your results only by: Marland Kitchen MyChart Message (if you have MyChart) OR . A paper copy in the mail If you have any lab test that is abnormal or we need to change your treatment, we will call you to review the results.  Testing/Procedures: You had an EKG today.  Follow-Up: At [facility], you and your health needs are our priority.  As part of our continuing mission to provide you with exceptional heart care, we have created designated Provider Care Teams.  These Care Teams include your primary Cardiologist (physician) and Advanced Practice Providers (APPs -  Physician Assistants and Nurse Practitioners) who all work together to provide you with the care you need, when you need it.  Your next appointment:   6 months  The format for your next appointment:   In Person  Provider:   Jenne Campus, MD  Other Instructions    DASH Eating Plan DASH stands for "Dietary Approaches to Stop Hypertension." The DASH eating plan is a healthy eating plan that has been shown to reduce high blood pressure (hypertension). It may also reduce your risk for type 2 diabetes, heart disease, and stroke. The DASH eating plan may also help with weight loss. What are tips for following this plan?  General guidelines  Avoid eating more than 2,300 mg (milligrams) of salt (sodium) a day. If you have hypertension, you may need to reduce your sodium intake to 1,500 mg a day.  Limit alcohol intake to no more than 1 drink a day for nonpregnant women and 2 drinks a  day for men. One drink equals 12 oz of beer, 5 oz of wine, or 1 oz of hard liquor.  Work with your health care provider to maintain a healthy body weight or to lose weight. Ask what an ideal weight is for you.  Get at least 30 minutes of exercise that causes your heart to beat faster (aerobic exercise) most days of the week. Activities may include walking, swimming, or biking.  Work with your health care provider or diet and nutrition specialist (dietitian) to adjust your eating plan to your individual calorie needs. Reading food labels   Check food labels for the amount of sodium per serving. Choose foods with less than 5 percent of the Daily Value of sodium. Generally, foods with less than 300 mg of sodium per serving fit into this eating plan.  To find whole grains, look for the word "whole" as the first word in the ingredient list. Shopping  Buy products labeled as "low-sodium" or "no salt added."  Buy fresh foods. Avoid canned foods and premade or frozen meals. Cooking  Avoid adding salt when cooking. Use salt-free seasonings or herbs instead of table salt or sea salt. Check with your health care provider or pharmacist before using salt substitutes.  Do not fry foods. Cook foods using healthy methods such as baking, boiling, grilling, and broiling instead.  Cook with heart-healthy oils, such as olive, canola, soybean, or sunflower oil. Meal planning  Eat a balanced diet that includes: ? 5  or more servings of fruits and vegetables each day. At each meal, try to fill half of your plate with fruits and vegetables. ? Up to 6-8 servings of whole grains each day. ? Less than 6 oz of lean meat, poultry, or fish each day. A 3-oz serving of meat is about the same size as a deck of cards. One egg equals 1 oz. ? 2 servings of low-fat dairy each day. ? A serving of nuts, seeds, or beans 5 times each week. ? Heart-healthy fats. Healthy fats called Omega-3 fatty acids are found in foods such  as flaxseeds and coldwater fish, like sardines, salmon, and mackerel.  Limit how much you eat of the following: ? Canned or prepackaged foods. ? Food that is high in trans fat, such as fried foods. ? Food that is high in saturated fat, such as fatty meat. ? Sweets, desserts, sugary drinks, and other foods with added sugar. ? Full-fat dairy products.  Do not salt foods before eating.  Try to eat at least 2 vegetarian meals each week.  Eat more home-cooked food and less restaurant, buffet, and fast food.  When eating at a restaurant, ask that your food be prepared with less salt or no salt, if possible. What foods are recommended? The items listed may not be a complete list. Talk with your dietitian about what dietary choices are best for you. Grains Whole-grain or whole-wheat bread. Whole-grain or whole-wheat pasta. Brown rice. [place]. Bulgur. Whole-grain and low-sodium cereals. Pita bread. Low-fat, low-sodium crackers. Whole-wheat flour tortillas. Vegetables Fresh or frozen vegetables (raw, steamed, roasted, or grilled). Low-sodium or reduced-sodium tomato and vegetable juice. Low-sodium or reduced-sodium tomato sauce and tomato paste. Low-sodium or reduced-sodium canned vegetables. Fruits All fresh, dried, or frozen fruit. Canned fruit in natural juice (without added sugar). Meat and other protein foods Skinless chicken or [place]. Ground chicken or [place]. Pork with fat trimmed off. Fish and seafood. Egg whites. Dried beans, peas, or lentils. Unsalted nuts, nut butters, and seeds. Unsalted canned beans. Lean cuts of beef with fat trimmed off. Low-sodium, lean deli meat. Dairy Low-fat (1%) or fat-free (skim) milk. Fat-free, low-fat, or reduced-fat cheeses. Nonfat, low-sodium ricotta or cottage cheese. Low-fat or nonfat yogurt. Low-fat, low-sodium cheese. Fats and oils Soft margarine without trans fats. Vegetable oil. Low-fat, reduced-fat, or light mayonnaise and salad dressings  (reduced-sodium). Canola, safflower, olive, soybean, and sunflower oils. Avocado. Seasoning and other foods Herbs. Spices. Seasoning mixes without salt. Unsalted popcorn and pretzels. Fat-free sweets. What foods are not recommended? The items listed may not be a complete list. Talk with your dietitian about what dietary choices are best for you. Grains Baked goods made with fat, such as croissants, muffins, or some breads. Dry pasta or rice meal packs. Vegetables Creamed or fried vegetables. Vegetables in a cheese sauce. Regular canned vegetables (not low-sodium or reduced-sodium). Regular canned tomato sauce and paste (not low-sodium or reduced-sodium). Regular tomato and vegetable juice (not low-sodium or reduced-sodium). Angie Fava. Olives. Fruits Canned fruit in a light or heavy syrup. Fried fruit. Fruit in cream or butter sauce. Meat and other protein foods Fatty cuts of meat. Ribs. Fried meat. Berniece Salines. Sausage. Bologna and other processed lunch meats. Salami. Fatback. Hotdogs. Bratwurst. Salted nuts and seeds. Canned beans with added salt. Canned or smoked fish. Whole eggs or egg yolks. Chicken or [place] with skin. Dairy Whole or 2% milk, cream, and half-and-half. Whole or full-fat cream cheese. Whole-fat or sweetened yogurt. Full-fat cheese. Nondairy creamers. Whipped toppings. Processed cheese  and cheese spreads. Fats and oils Butter. Stick margarine. Lard. Shortening. Ghee. Bacon fat. Tropical oils, such as coconut, palm kernel, or palm oil. Seasoning and other foods Salted popcorn and pretzels. Onion salt, garlic salt, seasoned salt, table salt, and sea salt. Worcestershire sauce. Tartar sauce. Barbecue sauce. Teriyaki sauce. Soy sauce, including reduced-sodium. Steak sauce. Canned and packaged gravies. Fish sauce. Oyster sauce. Cocktail sauce. Horseradish that you find on the shelf. Ketchup. Mustard. Meat flavorings and tenderizers. Bouillon cubes. Hot sauce and Tabasco sauce. Premade or  packaged marinades. Premade or packaged taco seasonings. Relishes. Regular salad dressings. Where to find more information:  National Heart, Lung, and [place]: https://wilson-eaton.com/  American Heart Association: www.heart.org Summary  The DASH eating plan is a healthy eating plan that has been shown to reduce high blood pressure (hypertension). It may also reduce your risk for type 2 diabetes, heart disease, and stroke.  With the DASH eating plan, you should limit salt (sodium) intake to 2,300 mg a day. If you have hypertension, you may need to reduce your sodium intake to 1,500 mg a day.  When on the DASH eating plan, aim to eat more fresh fruits and vegetables, whole grains, lean proteins, low-fat dairy, and heart-healthy fats.  Work with your health care provider or diet and nutrition specialist (dietitian) to adjust your eating plan to your individual calorie needs. This information is not intended to replace advice given to you by your health care provider. Make sure you discuss any questions you have with your health care provider. Document Released: 06/01/2011 Document Revised: 05/25/2017 Document Reviewed: 06/05/2016 Elsevier Patient Education  2020 [employer].

## 2019-04-11 NOTE — Telephone Encounter (Signed)
LVM for pt to call to discuss.  T. Darreon Lutes, CMA  

## 2019-04-11 NOTE — Telephone Encounter (Signed)
That would be addressed by hematology- please ask if she would like a referral. Thanks! Valetta Fuller

## 2019-04-15 NOTE — Telephone Encounter (Signed)
Attempted to reach pt by phone again today.  Since I have been unable to reach her and she has not returned phone calls, MyChart message sent to pt.  Charyl Bigger, CMA

## 2019-04-21 ENCOUNTER — Other Ambulatory Visit: Payer: Self-pay | Admitting: Specialist

## 2019-05-09 ENCOUNTER — Ambulatory Visit (INDEPENDENT_AMBULATORY_CARE_PROVIDER_SITE_OTHER): Payer: Medicare HMO

## 2019-05-09 ENCOUNTER — Other Ambulatory Visit: Payer: Self-pay

## 2019-05-09 ENCOUNTER — Encounter: Payer: Self-pay | Admitting: Specialist

## 2019-05-09 ENCOUNTER — Ambulatory Visit (INDEPENDENT_AMBULATORY_CARE_PROVIDER_SITE_OTHER): Payer: Medicare HMO | Admitting: Specialist

## 2019-05-09 VITALS — BP 144/87 | HR 108 | Ht 60.0 in | Wt 165.0 lb

## 2019-05-09 DIAGNOSIS — M7581 Other shoulder lesions, right shoulder: Secondary | ICD-10-CM

## 2019-05-09 DIAGNOSIS — G8929 Other chronic pain: Secondary | ICD-10-CM | POA: Diagnosis not present

## 2019-05-09 DIAGNOSIS — M48062 Spinal stenosis, lumbar region with neurogenic claudication: Secondary | ICD-10-CM | POA: Diagnosis not present

## 2019-05-09 DIAGNOSIS — M4722 Other spondylosis with radiculopathy, cervical region: Secondary | ICD-10-CM

## 2019-05-09 DIAGNOSIS — M25511 Pain in right shoulder: Secondary | ICD-10-CM

## 2019-05-09 DIAGNOSIS — M170 Bilateral primary osteoarthritis of knee: Secondary | ICD-10-CM | POA: Diagnosis not present

## 2019-05-09 DIAGNOSIS — M1711 Unilateral primary osteoarthritis, right knee: Secondary | ICD-10-CM | POA: Diagnosis not present

## 2019-05-09 DIAGNOSIS — M1712 Unilateral primary osteoarthritis, left knee: Secondary | ICD-10-CM

## 2019-05-09 DIAGNOSIS — M7521 Bicipital tendinitis, right shoulder: Secondary | ICD-10-CM

## 2019-05-09 MED ORDER — CYCLOBENZAPRINE HCL 10 MG PO TABS: 10.0000 mg | ORAL_TABLET | Freq: Three times a day (TID) | ORAL | 0 refills | Status: DC | PRN

## 2019-05-09 MED ORDER — GABAPENTIN 100 MG PO CAPS: ORAL_CAPSULE | ORAL | 3 refills | Status: DC

## 2019-05-09 MED ORDER — BUPIVACAINE HCL 0.5 % IJ SOLN
3.0000 mL | INTRAMUSCULAR | Status: AC | PRN
  Administered 2019-05-09: 10:00:00 3 mL via INTRA_ARTICULAR

## 2019-05-09 MED ORDER — BUPIVACAINE HCL 0.25 % IJ SOLN
4.0000 mL | INTRAMUSCULAR | Status: AC | PRN
  Administered 2019-05-09: 4 mL via INTRA_ARTICULAR

## 2019-05-09 MED ORDER — GABAPENTIN 300 MG PO CAPS: 600.0000 mg | ORAL_CAPSULE | Freq: Every day | ORAL | 3 refills | Status: DC

## 2019-05-09 MED ORDER — HYDROCODONE-ACETAMINOPHEN 5-325 MG PO TABS: ORAL_TABLET | ORAL | 0 refills | Status: DC

## 2019-05-09 MED ORDER — METHYLPREDNISOLONE ACETATE 40 MG/ML IJ SUSP
40.0000 mg | INTRAMUSCULAR | Status: AC | PRN
  Administered 2019-05-09: 10:00:00 40 mg via INTRA_ARTICULAR

## 2019-05-09 NOTE — Patient Instructions (Signed)
Avoid bending, stooping and avoid lifting weights greater than 10 lbs. Avoid prolong standing and walking. Avoid frequent bending and stooping  No lifting greater than 10 lbs. May use ice or moist heat for pain. Weight loss is of benefit. Handicap license is approved.  Avoid overhead lifting and overhead use of the arms. Pillows to keep from sleeping directly on the shoulders Limited lifting to less than 10 lbs. Ice or heat for relief. You are not able to use NSAIDs are helpful, such as alleve or motrin, be careful not to use in excess as they place burdens on the kidney. Stretching exercise help and strengthening is helpful to build endurance.
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
## 2019-05-12 ENCOUNTER — Ambulatory Visit: Payer: Medicare HMO | Admitting: Gastroenterology

## 2019-05-13 ENCOUNTER — Other Ambulatory Visit: Payer: Self-pay

## 2019-05-13 ENCOUNTER — Other Ambulatory Visit: Payer: Medicare HMO

## 2019-05-13 DIAGNOSIS — Z0000 Encounter for general adult medical examination without abnormal findings: Secondary | ICD-10-CM

## 2019-05-13 DIAGNOSIS — I10 Essential (primary) hypertension: Secondary | ICD-10-CM

## 2019-05-13 DIAGNOSIS — D696 Thrombocytopenia, unspecified: Secondary | ICD-10-CM

## 2019-05-13 DIAGNOSIS — E559 Vitamin D deficiency, unspecified: Secondary | ICD-10-CM

## 2019-05-20 ENCOUNTER — Ambulatory Visit (INDEPENDENT_AMBULATORY_CARE_PROVIDER_SITE_OTHER): Payer: Medicare HMO | Admitting: Specialist

## 2019-05-20 ENCOUNTER — Other Ambulatory Visit: Payer: Self-pay

## 2019-05-20 ENCOUNTER — Encounter: Payer: Self-pay | Admitting: Specialist

## 2019-05-20 ENCOUNTER — Telehealth: Payer: Self-pay | Admitting: Specialist

## 2019-05-20 VITALS — BP 127/81 | HR 100 | Ht 60.0 in | Wt 160.0 lb

## 2019-05-20 DIAGNOSIS — M48062 Spinal stenosis, lumbar region with neurogenic claudication: Secondary | ICD-10-CM

## 2019-05-20 DIAGNOSIS — M4722 Other spondylosis with radiculopathy, cervical region: Secondary | ICD-10-CM | POA: Diagnosis not present

## 2019-05-20 DIAGNOSIS — M1711 Unilateral primary osteoarthritis, right knee: Secondary | ICD-10-CM | POA: Diagnosis not present

## 2019-05-20 DIAGNOSIS — M1712 Unilateral primary osteoarthritis, left knee: Secondary | ICD-10-CM

## 2019-05-20 MED ORDER — HYDROCODONE-ACETAMINOPHEN 5-325 MG PO TABS: ORAL_TABLET | ORAL | 0 refills | Status: DC

## 2019-05-20 MED ORDER — BUPIVACAINE HCL 0.25 % IJ SOLN
4.0000 mL | INTRAMUSCULAR | Status: AC | PRN
  Administered 2019-05-20: 4 mL via INTRA_ARTICULAR

## 2019-05-20 MED ORDER — METHYLPREDNISOLONE ACETATE 40 MG/ML IJ SUSP
40.0000 mg | INTRAMUSCULAR | Status: AC | PRN
  Administered 2019-05-20: 40 mg via INTRA_ARTICULAR

## 2019-05-20 MED ORDER — CYCLOBENZAPRINE HCL 10 MG PO TABS: 10.0000 mg | ORAL_TABLET | Freq: Three times a day (TID) | ORAL | 0 refills | Status: DC | PRN

## 2019-05-20 NOTE — Progress Notes (Addendum)
Office Visit Note   Patient: Amy Moon           Date of Birth: [birth year]           MRN: [id number] Visit Date: 05/20/2019              Requested by: [name], NP [place],  [place] [address] PCP: [name], NP   Assessment & Plan: Visit Diagnoses:  1. Other spondylosis with radiculopathy, cervical region   2. Spinal stenosis of lumbar region with neurogenic claudication   3. Unilateral primary osteoarthritis, left knee   4. Unilateral primary osteoarthritis, right knee   Persistent severe pain right neck and right shoulder and into the right thumb. Injection of the right shoulder doesn't Last more than a couple hours. Positive spurling sign and weak right shoulder girdle and right biceps. Numbness into the right Thumb. Radiographs are not impressive for right shoulder pathology and MRI in the past is suggestive of spondylosis multiple levels Will ask for selective nerve root blocks and if this relieves the pain consider right sided C4 and C5 foramenotomies.   Plan: Avoid overhead lifting and overhead use of the arms. Do not lift greater than 5 lbs. Adjust head rest in vehicle to prevent hyperextension if rear ended. Take extra precautions to avoid falling. Avoid bending, stooping and avoid lifting weights greater than 10 lbs. Avoid prolong standing and walking. Avoid frequent bending and stooping  No lifting greater than 10 lbs. May use ice or moist heat for pain. Weight loss is of benefit. Handicap license is approved. Dr. Romona Curls secretary/Assistant will call to arrange for selective block right side C4 and C5 nerves.  Knee is suffering from osteoarthritis, only real proven treatments are Weight loss and exercise. Well padded shoes help. Ice the knee 2-3 times a day 15-20 mins at a time.  Follow-Up Instructions: No follow-ups on file.   Orders:  No orders of the defined types were placed in this encounter.  No orders of the defined  types were placed in this encounter.     Procedures: Large Joint Inj: L knee on 05/20/2019 3:28 PM Indications: pain Details: 25 G 1.5 in needle, anterolateral approach  Arthrogram: No  Medications: 40 mg methylPREDNISolone acetate 40 MG/ML; 4 mL bupivacaine 0.25 % Outcome: tolerated well, no immediate complications  Bandaid applied. Procedure, treatment alternatives, risks and benefits explained, specific risks discussed. Consent was given by the patient. Immediately prior to procedure a time out was called to verify the correct patient, procedure, equipment, support staff and site/side marked as required. Patient was prepped and draped in the usual sterile fashion.       Clinical Data: No additional findings.   Subjective: Chief Complaint  Patient presents with  . Right Shoulder - Follow-up  . Left Knee - Follow-up    68 year old female with history of neck pain and right shoulder pain. The pain in the right shoulder is worsening and she underwent an injection of the right bicipital groove and the right anterior SAS. This helped for about one day and the pain has gradually returned. She has had MRI of the neck in the past  09/2017 with spondylosis of the right C5 and C4 neuroforamen. No bowel issues, but some weakness in bladder thought to be secondary to  Aging and weaken pelvic floor muscles. No pain with cough or sneeze.   Review of Systems  Constitutional: Positive for unexpected weight change. Negative for  activity change, appetite change, chills, diaphoresis, fatigue and fever.  HENT: Negative for congestion, dental problem, drooling, ear discharge, ear pain, facial swelling, hearing loss, mouth sores, nosebleeds, postnasal drip, rhinorrhea, sinus pressure, sinus pain, sneezing, sore throat, tinnitus, trouble swallowing and voice change.   Eyes: Negative.   Respiratory: Negative.  Negative for cough, choking, chest tightness, shortness of breath, wheezing and stridor.    Cardiovascular: Negative.  Negative for chest pain, palpitations and leg swelling.  Gastrointestinal: Negative.  Negative for abdominal distention, abdominal pain, anal bleeding, blood in stool, constipation, diarrhea, nausea, rectal pain and vomiting.  Endocrine: Negative.  Negative for cold intolerance, heat intolerance, polydipsia, polyphagia and polyuria.  Genitourinary: Negative.  Negative for decreased urine volume, difficulty urinating, dyspareunia, dysuria, enuresis, flank pain, frequency, genital sores, hematuria, menstrual problem, pelvic pain, urgency, vaginal bleeding and vaginal discharge.  Musculoskeletal: Negative.   Skin: Negative.   Allergic/Immunologic: Negative.  Negative for environmental allergies, food allergies and immunocompromised state.  Neurological: Positive for weakness and numbness.  Hematological: Negative.   Psychiatric/Behavioral: Negative.      Objective: Vital Signs: BP 127/81 (BP Location: Left Arm, Patient Position: Sitting)   Pulse 100   Ht 5' (1.524 m)   Wt 160 lb (72.6 kg)   BMI 31.25 kg/m   Physical Exam Constitutional:      Appearance: She is well-developed.  HENT:     Head: Normocephalic and atraumatic.  Eyes:     Pupils: Pupils are equal, round, and reactive to light.  Neck:     Musculoskeletal: Normal range of motion and neck supple.  Pulmonary:     Effort: Pulmonary effort is normal.     Breath sounds: Normal breath sounds.  Abdominal:     General: Bowel sounds are normal.     Palpations: Abdomen is soft.  Skin:    General: Skin is warm and dry.  Neurological:     Mental Status: She is alert and oriented to person, place, and time.  Psychiatric:        Behavior: Behavior normal.        Thought Content: Thought content normal.        Judgment: Judgment normal.     Back Exam   Tenderness  The patient is experiencing tenderness in the cervical.  Range of Motion  Extension: abnormal  Flexion: normal  Lateral bend right:  abnormal  Lateral bend left: normal  Rotation right: abnormal   Muscle Strength  Right Quadriceps:  5/5  Left Quadriceps:  5/5  Right Hamstrings:  5/5  Left Hamstrings:  5/5   Tests  Straight leg raise right: negative Straight leg raise left: negative  Reflexes  Patellar: 1/4 Achilles: 1/4 Biceps: 1/4 Babinski's sign: normal   Other  Toe walk: normal Heel walk: normal Sensation: normal Gait: normal  Erythema: no back redness Scars: absent      Specialty Comments:  No specialty comments available.  Imaging: No results found.   PMFS History: Patient Active Problem List   Diagnosis Date Noted  . Bronchitis 06/03/2018  . Encounter for Medicare annual wellness exam 05/09/2018  . Vitamin D deficiency 11/05/2017  . Encounter for hepatitis C virus screening test for high risk patient 11/05/2017  . Dysphagia 09/03/2017  . Thrombocytopenia ([place]) 09/03/2017  . Healthcare maintenance 07/30/2017  . Chronic pain syndrome 07/30/2017  . Chest pain 07/30/2017  . Parkinson's disease ([place]) 07/30/2017  . Hypertension 07/30/2017  . Severe obesity (BMI >= 40) ([place]) 05/15/2016  . Severe episode of  recurrent major depressive disorder, without psychotic features ([place]) 05/15/2016  . Tremor of both hands 05/15/2016  . Irritant contact dermatitis 08/25/2015   Past Medical History:  Diagnosis Date  . Anemia   . Angina pectoris ([place])   . Arthritis   . Asthma    years ago  . Cancer (HCC)    squamous cell carcinoma, nose  . Cataract   . Chronic kidney disease    possibly per pt  . Cirrhosis ([place])   . Clotting disorder (HCC)    platelets are low  . Depression   . Drug-induced low platelet count   . Duodenal ulcer   . Enlarged thyroid   . Family history of adverse reaction to anesthesia    father had difficulty waking up and breathing on his own after surgery  . GERD (gastroesophageal reflux disease)   . Granulomatous disease ([place])   . Hepatic disease   . Hiatal hernia    . History of kidney stones   . Hypertension   . Low vitamin D level   . Neuropathy   . Osteopenia   . Osteoporosis   . Parkinson's disease ([place])   . Pneumonia   . Portal hypertension ([place])   . Splenomegaly a    Family History  Problem Relation Age of Onset  . Cancer Mother        ovarian  . Hyperlipidemia Mother   . Alzheimer's disease Mother   . Arthritis Mother        rheumatoid  . Cancer Father        lung  . Parkinson's disease Father   . Alzheimer's disease Father   . ALS Sister   . Cancer Maternal Aunt        pancreatic, lung, liver,breast  . Breast cancer Maternal Aunt   . Cancer Maternal Uncle        pancreatic, liver, brain  . Heart attack Paternal Aunt   . Stroke Paternal Aunt   . Heart attack Paternal Uncle   . Stroke Paternal Grandmother   . Heart attack Paternal Grandfather   . Arthritis Sister        rheumatoid  . Huntington's disease Daughter        presumed inherited from father per patient  . Colon cancer Maternal Uncle   . Breast cancer Maternal Aunt   . Breast cancer Maternal Aunt     Past Surgical History:  Procedure Laterality Date  . ABDOMINAL HYSTERECTOMY    . APPENDECTOMY    . BACK SURGERY     thoracic-fractured   . BACK SURGERY     lumbar - fractured  . BREAST LUMPECTOMY WITH RADIOACTIVE SEED LOCALIZATION Right 08/01/2018   Procedure: RADIOACTIVE SEED GUIDED RIGHT BREAST LUMPECTOMY;  Surgeon: Coralie Keens, MD;  Location: [facility];  Service: General;  Laterality: Right;  . ESOPHAGOGASTRODUODENOSCOPY  05/20/2008   Mild to moderate esophagitis. Otherwise normal EGD.   Marland Kitchen EYE SURGERY Left    cataract surgery with lens implant  . GASTROSTOMY     peptic ulcer - peg tube placement   Social History   Occupational History  . Not on file  Tobacco Use  . Smoking status: Former Smoker    Packs/day: 1.00    Years: 22.00    Pack years: 22.00    Types: Cigarettes    Quit date: 06/26/1986    Years since quitting: 32.9  . Smokeless  tobacco: Never Used  Substance and Sexual Activity  . Alcohol use: No  .  Drug use: No  . Sexual activity: Not Currently    Birth control/protection: None

## 2019-05-20 NOTE — Patient Instructions (Signed)
Avoid overhead lifting and overhead use of the arms. Do not lift greater than 5 lbs. Adjust head rest in vehicle to prevent hyperextension if rear ended. Take extra precautions to avoid falling. Avoid bending, stooping and avoid lifting weights greater than 10 lbs. Avoid prolong standing and walking. Avoid frequent bending and stooping  No lifting greater than 10 lbs. May use ice or moist heat for pain. Weight loss is of benefit. Handicap license is approved. Dr. Romona Curls secretary/Assistant will call to arrange for selective block right side C4 and C5 nerves.  Knee is suffering from osteoarthritis, only real proven treatments are Weight loss and exercise. Well padded shoes help. Ice the knee 2-3 times a day 15-20 mins at a time.

## 2019-05-20 NOTE — Telephone Encounter (Signed)
Patient is needing a 3 week f/u with Dr. Louanne Skye from today.  His next available is not until December 30th.  Patient did not want to see Jeneen Rinks.  Would you put her on your cancellation list.  Thank you.

## 2019-05-21 ENCOUNTER — Telehealth: Payer: Self-pay | Admitting: *Deleted

## 2019-05-21 NOTE — Telephone Encounter (Signed)
I put her on the cancellation list 

## 2019-05-21 NOTE — Telephone Encounter (Signed)
Called pt and advised.  

## 2019-05-21 NOTE — Telephone Encounter (Signed)
Hi Tonisha,   Okay to hold Plavix 7 days prior to appointment 06/16/2019. Please have Dr. Ernestina Patches advise when she should resume Plavix after the steroid injection.   Best, Loel Dubonnet, NP

## 2019-05-28 ENCOUNTER — Ambulatory Visit: Payer: Medicare HMO | Admitting: Specialist

## 2019-06-02 ENCOUNTER — Other Ambulatory Visit: Payer: Self-pay | Admitting: Specialist

## 2019-06-02 ENCOUNTER — Telehealth: Payer: Self-pay | Admitting: Specialist

## 2019-06-02 NOTE — Telephone Encounter (Signed)
Patient called needing Rx refilled Flexeril and Hydrocodone. The number to contact patient is [phone]

## 2019-06-02 NOTE — Telephone Encounter (Signed)
This has been sent to Dr. Louanne Skye

## 2019-06-04 ENCOUNTER — Other Ambulatory Visit: Payer: Self-pay

## 2019-06-04 ENCOUNTER — Encounter: Payer: Self-pay | Admitting: Physical Medicine and Rehabilitation

## 2019-06-04 ENCOUNTER — Ambulatory Visit (INDEPENDENT_AMBULATORY_CARE_PROVIDER_SITE_OTHER): Payer: Medicare HMO | Admitting: Physical Medicine and Rehabilitation

## 2019-06-04 DIAGNOSIS — R202 Paresthesia of skin: Secondary | ICD-10-CM

## 2019-06-04 NOTE — Progress Notes (Signed)
 .  Numeric Pain Rating Scale and Functional Assessment Average Pain 10   In the last MONTH (on 0-10 scale) has pain interfered with the following?  1. General activity like being  able to carry out your everyday physical activities such as walking, climbing stairs, carrying groceries, or moving a chair?  Rating(9)     

## 2019-06-05 NOTE — Progress Notes (Signed)
Amy [place] - 68 y.o. female MRN [id number]  Date of birth: June 13, 1951  Office Visit Note: Visit Date: 06/04/2019 PCP: [name], NP Referred by: [name], NP  Subjective: Chief Complaint  Patient presents with  . Right Arm - Pain, Edema  . Right Hand - Pain   HPI: Amy Moon is a 68 y.o. female who comes in today For electrodiagnostic study of the right upper limb as requested by Dr. Basil Dess.  Patient reports 10 out of 10 right shoulder and arm pain.  She reports swelling in the right arm and sometimes right hand.  She does not endorse frank numbness or tingling.  She says the symptoms started about a month or so ago.  She reports moving the right arm makes her pain much worse.  Pain medication and muscle relaxer seem to help.  Her case is complicated by history of depression and anxiety.  Her case is also complicated by history of Parkinson's disease.  She is right-hand dominant has not had prior electrodiagnostic study of the upper limb but we have done electrodiagnostic study of the lower limb.  MRI of the cervical spine was performed last year.  She does have some level of foraminal stenosis at C4 and C5.  No high-grade central stenosis.  X-rays of the shoulder also performed reviewed below with Dr. Otho Ket interpretation.  In office injection by Dr. Louanne Skye of her shoulder was beneficial temporarily.  ROS Otherwise per HPI.  Assessment & Plan: Visit Diagnoses:  1. Paresthesia of skin     Plan: Impression: Essentially NORMAL electrodiagnostic study of the right upper limb.  There is no significant electrodiagnostic evidence of nerve entrapment, brachial plexopathy or cervical radiculopathy.    As you know, purely sensory or demyelinating radiculopathies and chemical radiculitis may not be detected with this particular electrodiagnostic study.  Recommendations: 1.  Follow-up with referring physician. 2.  Continue current management of symptoms.  Meds & Orders:  No orders of the defined types were placed in this encounter.   Orders Placed This Encounter  Procedures  . NCV with EMG (electromyography)    Follow-up: Return for Basil Dess, MD.   Procedures: No procedures performed  EMG & NCV Findings: All nerve conduction studies (as indicated in the following tables) were within normal limits.    All examined muscles (as indicated in the following table) showed no evidence of electrical instability.    Impression: Essentially NORMAL electrodiagnostic study of the right upper limb.  There is no significant electrodiagnostic evidence of nerve entrapment, brachial plexopathy or cervical radiculopathy.    As you know, purely sensory or demyelinating radiculopathies and chemical radiculitis may not be detected with this particular electrodiagnostic study.  Recommendations: 1.  Follow-up with referring physician. 2.  Continue current management of symptoms.  ___________________________ Laurence Spates FAAPMR Board Certified, American Board of Physical Medicine and Rehabilitation    Nerve Conduction Studies Anti Sensory Summary Table   Stim Site NR Peak (ms) Norm Peak (ms) P-T Amp (V) Norm P-T Amp Site1 Site2 Delta-P (ms) Dist (cm) Vel (m/s) Norm Vel (m/s)  Right Median Acr Palm Anti Sensory (2nd Digit)  30.9C  Wrist    3.3 <3.6 26.5 >10 Wrist Palm 1.4 0.0    Palm    1.9 <2.0 26.9         Right Radial Anti Sensory (Base 1st Digit)  31.4C  Wrist    2.0 <3.1 24.4  Wrist Base 1st Digit 2.0 0.0  Right Ulnar Anti Sensory (5th Digit)  31.5C  Wrist    3.5 <3.7 26.5 >15.0 Wrist 5th Digit 3.5 14.0 40 >38   Motor Summary Table   Stim Site NR Onset (ms) Norm Onset (ms) O-P Amp (mV) Norm O-P Amp Site1 Site2 Delta-0 (ms) Dist (cm) Vel (m/s) Norm Vel (m/s)  Right Median Motor (Abd Poll Brev)  31.6C  Wrist    3.1 <4.2 5.1 >5 Elbow Wrist 3.8 20.0 53 >50  Elbow    6.9  4.9         Right Ulnar Motor (Abd Dig Min)  31.9C  Wrist    2.7 <4.2 11.3 >3 B  Elbow Wrist 3.2 20.0 63 >53  B Elbow    5.9  10.1  A Elbow B Elbow 1.6 10.0 63 >53  A Elbow    7.5  9.8          EMG   Side Muscle Nerve Root Ins Act Fibs Psw Amp Dur Poly Recrt Int Fraser Din Comment  Right 1stDorInt Ulnar C8-T1 Nml Nml Nml Nml Nml 0 Nml Nml   Right Abd Poll Brev Median C8-T1 Nml Nml Nml Nml Nml 0 Nml Nml   Right ExtDigCom   Nml Nml Nml Nml Nml 0 Nml Nml   Right Triceps Radial C6-7-8 Nml Nml Nml Nml Nml 0 Nml Nml   Right Deltoid Axillary C5-6 Nml Nml Nml Nml Nml 0 Nml Nml     Nerve Conduction Studies Anti Sensory Left/Right Comparison   Stim Site L Lat (ms) R Lat (ms) L-R Lat (ms) L Amp (V) R Amp (V) L-R Amp (%) Site1 Site2 L Vel (m/s) R Vel (m/s) L-R Vel (m/s)  Median Acr Palm Anti Sensory (2nd Digit)  30.9C  Wrist  3.3   26.5  Wrist Palm     Palm  1.9   26.9        Radial Anti Sensory (Base 1st Digit)  31.4C  Wrist  2.0   24.4  Wrist Base 1st Digit     Ulnar Anti Sensory (5th Digit)  31.5C  Wrist  3.5   26.5  Wrist 5th Digit  40    Motor Left/Right Comparison   Stim Site L Lat (ms) R Lat (ms) L-R Lat (ms) L Amp (mV) R Amp (mV) L-R Amp (%) Site1 Site2 L Vel (m/s) R Vel (m/s) L-R Vel (m/s)  Median Motor (Abd Poll Brev)  31.6C  Wrist  3.1   5.1  Elbow Wrist  53   Elbow  6.9   4.9        Ulnar Motor (Abd Dig Min)  31.9C  Wrist  2.7   11.3  B Elbow Wrist  63   B Elbow  5.9   10.1  A Elbow B Elbow  63   A Elbow  7.5   9.8           Waveforms:             Clinical History: 05/09/2019 right shoulder plain film x-ray  AP, lateral and outlet views show minimal type 2 acromion process with  minimal AC arthrosis, G-H joint well maintained with only minimal DJD. The  SAS is 12.5 mm normal, mild cystic and sclerotic changes at the greater  tuberosity area of Cuff attachment.  --  MRI CERVICAL SPINE WITHOUT CONTRAST  TECHNIQUE: Multiplanar, multisequence MR imaging of the cervical spine was performed. No intravenous contrast was administered.   COMPARISON:  Cervical spine radiographs 10/03/2017.  FINDINGS: Alignment: Improved cervical lordosis compared to the radiographs last month. Mild straightening of lordosis. No spondylolisthesis.  Vertebrae: Heterogeneous endplate marrow signal appears to be degenerative in nature throughout much of the cervical spine. There are areas of mild endplate marrow edema which appears degenerative (posteriorly at C3-C4). Furthermore, there is confluent marrow edema in the left C2-C3 and right C3-C4 facets (series 4, image 1 on the right). These facets are chronically degenerated.  No other acute osseous abnormality identified.  Cord: Spinal cord signal is within normal limits at all visualized levels.  Posterior Fossa, vertebral arteries, paraspinal tissues: Negative visible brain parenchyma. Cervicomedullary junction is within normal limits. Preserved major vascular flow voids in the neck. Several small T2 hyperintense thyroid nodules measure up to 11 mm and do not meet size criteria for ultrasound follow-up. Negative visible lung apices.  Disc levels:  C2-C3: Broad-based mild disc bulging. Moderate facet hypertrophy greater on the left, which corresponds to the site of facet marrow edema. Mild to moderate ligament flavum hypertrophy. Mild spinal stenosis. Mild cord mass effect. Mild left C3 foraminal stenosis.  C3-C4: Disc space loss with circumferential disc osteophyte complex. Broad-based posterior component. Moderate facet and ligament flavum hypertrophy greater on the right, where there is associated degenerative marrow edema. Spinal stenosis with mild cord mass effect. Moderate to severe bilateral C4 foraminal stenosis greater on the right.  C4-C5: Circumferential disc bulge and endplate spurring with broad-based posterior and biforaminal involvement. Mild facet and ligament flavum hypertrophy. Mild spinal stenosis with no cord mass effect. Moderate bilateral C5  foraminal stenosis.  C5-C6: Circumferential disc bulge with mild endplate spurring. Broad-based left foraminal involvement. Mild facet hypertrophy greater on the left. No spinal stenosis. Mild to moderate left and mild right C6 foraminal stenosis.  C6-C7: Circumferential disc bulge with broad-based left foraminal component (series 2, image 10). Endplate spurring. Mild facet and ligament flavum hypertrophy. No spinal stenosis. Moderate to severe left C7 neural foraminal stenosis.  C7-T1:  Mild facet hypertrophy.  No stenosis.  Thoracic findings are reported separately today.  IMPRESSION: 1. Marrow edema suggesting acute exacerbation of chronic facet joint arthritis at the left C2-C3 and right C3-C4 levels. There is some associated posterior and rightward degenerative endplate marrow edema at C3-C4. 2. Multifactorial spinal stenosis with up to mild spinal cord mass effect C2-C3 through C4-C5. No cord signal abnormality. 3. Moderate or severe neural foraminal stenosis at the bilateral C4, left C7, and to a lesser extent bilateral C5 nerve levels.   Electronically Signed   By: Genevie Ann M.D.   On: 10/24/2017 09:24   She reports that she quit smoking about 32 years ago. Her smoking use included cigarettes. She has a 22.00 pack-year smoking history. She has never used smokeless tobacco.  Recent Labs    07/01/18 1234  HGBA1C 4.7    Objective:  VS:  HT:    WT:   BMI:     BP:   HR: bpm  TEMP: ( )  RESP:  Physical Exam Musculoskeletal:        General: No swelling, tenderness or deformity.     Comments: Patient will not let me move her shoulder and upper arm in any direction whether it be abduction flexion or extension for any degree without considerable pain.  Actively she can do this very slowly without as much pain.  Again even minute movements she is very apprehensive.  Inspection reveals no atrophy of the bilateral APB or FDI or hand intrinsics. There is no  swelling,  color changes, allodynia or dystrophic changes. There is 5 out of 5 strength in the bilateral wrist extension, finger abduction and long finger flexion. There is intact sensation to light touch in all dermatomal and peripheral nerve distributions. There is a negative Phalen's test bilaterally. There is a negative Hoffmann's test bilaterally.  Skin:    General: Skin is warm and dry.     Findings: No erythema or rash.  Neurological:     General: No focal deficit present.     Mental Status: She is alert and oriented to person, place, and time.     Motor: No weakness or abnormal muscle tone.     Coordination: Coordination normal.  Psychiatric:        Mood and Affect: Mood normal.        Behavior: Behavior normal.     Ortho Exam Imaging: No results found.  Past Medical/Family/Surgical/Social History: Medications & Allergies reviewed per EMR, new medications updated. Patient Active Problem List   Diagnosis Date Noted  . Bronchitis 06/03/2018  . Encounter for Medicare annual wellness exam 05/09/2018  . Vitamin D deficiency 11/05/2017  . Encounter for hepatitis C virus screening test for high risk patient 11/05/2017  . Dysphagia 09/03/2017  . Thrombocytopenia ([place]) 09/03/2017  . Healthcare maintenance 07/30/2017  . Chronic pain syndrome 07/30/2017  . Chest pain 07/30/2017  . Parkinson's disease ([place]) 07/30/2017  . Hypertension 07/30/2017  . Severe obesity (BMI >= 40) ([place]) 05/15/2016  . Severe episode of recurrent major depressive disorder, without psychotic features ([place]) 05/15/2016  . Tremor of both hands 05/15/2016  . Irritant contact dermatitis 08/25/2015   Past Medical History:  Diagnosis Date  . Anemia   . Angina pectoris ([place])   . Arthritis   . Asthma    years ago  . Cancer ([facility])    squamous cell carcinoma, nose  . Cataract   . Chronic kidney disease    possibly per pt  . Cirrhosis ([place])   . Clotting disorder ([facility])    platelets are low  . Depression   .  Drug-induced low platelet count   . Duodenal ulcer   . Enlarged thyroid   . Family history of adverse reaction to anesthesia    father had difficulty waking up and breathing on his own after surgery  . GERD (gastroesophageal reflux disease)   . Granulomatous disease ([place])   . Hepatic disease   . Hiatal hernia   . History of kidney stones   . Hypertension   . Low vitamin D level   . Neuropathy   . Osteopenia   . Osteoporosis   . Parkinson's disease ([place])   . Pneumonia   . Portal hypertension ([place])   . Splenomegaly a   Family History  Problem Relation Age of Onset  . Cancer Mother        ovarian  . Hyperlipidemia Mother   . Alzheimer's disease Mother   . Arthritis Mother        rheumatoid  . Cancer Father        lung  . Parkinson's disease Father   . Alzheimer's disease Father   . ALS Sister   . Cancer Maternal Aunt        pancreatic, lung, liver,breast  . Breast cancer Maternal Aunt   . Cancer Maternal Uncle        pancreatic, liver, brain  . Heart attack Paternal Aunt   . Stroke Paternal Aunt   . Heart attack Paternal Uncle   .  Stroke Paternal Grandmother   . Heart attack Paternal Grandfather   . Arthritis Sister        rheumatoid  . Huntington's disease Daughter        presumed inherited from father per patient  . Colon cancer Maternal Uncle   . Breast cancer Maternal Aunt   . Breast cancer Maternal Aunt    Past Surgical History:  Procedure Laterality Date  . ABDOMINAL HYSTERECTOMY    . APPENDECTOMY    . BACK SURGERY     thoracic-fractured   . BACK SURGERY     lumbar - fractured  . BREAST LUMPECTOMY WITH RADIOACTIVE SEED LOCALIZATION Right 08/01/2018   Procedure: RADIOACTIVE SEED GUIDED RIGHT BREAST LUMPECTOMY;  Surgeon: Coralie Keens, MD;  Location: [facility];  Service: General;  Laterality: Right;  . ESOPHAGOGASTRODUODENOSCOPY  05/20/2008   Mild to moderate esophagitis. Otherwise normal EGD.   Marland Kitchen EYE SURGERY Left    cataract surgery with lens implant   . GASTROSTOMY     peptic ulcer - peg tube placement   Social History   Occupational History  . Not on file  Tobacco Use  . Smoking status: Former Smoker    Packs/day: 1.00    Years: 22.00    Pack years: 22.00    Types: Cigarettes    Quit date: 06/26/1986    Years since quitting: 32.9  . Smokeless tobacco: Never Used  Substance and Sexual Activity  . Alcohol use: No  . Drug use: No  . Sexual activity: Not Currently    Birth control/protection: None

## 2019-06-10 ENCOUNTER — Other Ambulatory Visit: Payer: Self-pay

## 2019-06-10 ENCOUNTER — Encounter: Payer: Self-pay | Admitting: Specialist

## 2019-06-10 ENCOUNTER — Ambulatory Visit (INDEPENDENT_AMBULATORY_CARE_PROVIDER_SITE_OTHER): Payer: Medicare HMO | Admitting: Specialist

## 2019-06-10 ENCOUNTER — Telehealth: Payer: Self-pay | Admitting: Specialist

## 2019-06-10 VITALS — BP 141/81 | HR 95 | Ht 60.0 in | Wt 160.0 lb

## 2019-06-10 DIAGNOSIS — M6281 Muscle weakness (generalized): Secondary | ICD-10-CM

## 2019-06-10 DIAGNOSIS — M7501 Adhesive capsulitis of right shoulder: Secondary | ICD-10-CM

## 2019-06-10 DIAGNOSIS — M47812 Spondylosis without myelopathy or radiculopathy, cervical region: Secondary | ICD-10-CM

## 2019-06-10 NOTE — Telephone Encounter (Signed)
Patient called in requesting a call back from [facility]. Patient phone number is [phone].

## 2019-06-10 NOTE — Procedures (Signed)
EMG & NCV Findings: All nerve conduction studies (as indicated in the following tables) were within normal limits.    All examined muscles (as indicated in the following table) showed no evidence of electrical instability.    Impression: Essentially NORMAL electrodiagnostic study of the right upper limb.  There is no significant electrodiagnostic evidence of nerve entrapment, brachial plexopathy or cervical radiculopathy.    As you know, purely sensory or demyelinating radiculopathies and chemical radiculitis may not be detected with this particular electrodiagnostic study.  Recommendations: 1.  Follow-up with referring physician. 2.  Continue current management of symptoms.  ___________________________ Laurence Spates FAAPMR Board Certified, American Board of Physical Medicine and Rehabilitation    Nerve Conduction Studies Anti Sensory Summary Table   Stim Site NR Peak (ms) Norm Peak (ms) P-T Amp (V) Norm P-T Amp Site1 Site2 Delta-P (ms) Dist (cm) Vel (m/s) Norm Vel (m/s)  Right Median Acr Palm Anti Sensory (2nd Digit)  30.9C  Wrist    3.3 <3.6 26.5 >10 Wrist Palm 1.4 0.0    Palm    1.9 <2.0 26.9         Right Radial Anti Sensory (Base 1st Digit)  31.4C  Wrist    2.0 <3.1 24.4  Wrist Base 1st Digit 2.0 0.0    Right Ulnar Anti Sensory (5th Digit)  31.5C  Wrist    3.5 <3.7 26.5 >15.0 Wrist 5th Digit 3.5 14.0 40 >38   Motor Summary Table   Stim Site NR Onset (ms) Norm Onset (ms) O-P Amp (mV) Norm O-P Amp Site1 Site2 Delta-0 (ms) Dist (cm) Vel (m/s) Norm Vel (m/s)  Right Median Motor (Abd Poll Brev)  31.6C  Wrist    3.1 <4.2 5.1 >5 Elbow Wrist 3.8 20.0 53 >50  Elbow    6.9  4.9         Right Ulnar Motor (Abd Dig Min)  31.9C  Wrist    2.7 <4.2 11.3 >3 B Elbow Wrist 3.2 20.0 63 >53  B Elbow    5.9  10.1  A Elbow B Elbow 1.6 10.0 63 >53  A Elbow    7.5  9.8          EMG   Side Muscle Nerve Root Ins Act Fibs Psw Amp Dur Poly Recrt Int Fraser Din Comment  Right 1stDorInt Ulnar C8-T1  Nml Nml Nml Nml Nml 0 Nml Nml   Right Abd Poll Brev Median C8-T1 Nml Nml Nml Nml Nml 0 Nml Nml   Right ExtDigCom   Nml Nml Nml Nml Nml 0 Nml Nml   Right Triceps Radial C6-7-8 Nml Nml Nml Nml Nml 0 Nml Nml   Right Deltoid Axillary C5-6 Nml Nml Nml Nml Nml 0 Nml Nml     Nerve Conduction Studies Anti Sensory Left/Right Comparison   Stim Site L Lat (ms) R Lat (ms) L-R Lat (ms) L Amp (V) R Amp (V) L-R Amp (%) Site1 Site2 L Vel (m/s) R Vel (m/s) L-R Vel (m/s)  Median Acr Palm Anti Sensory (2nd Digit)  30.9C  Wrist  3.3   26.5  Wrist Palm     Palm  1.9   26.9        Radial Anti Sensory (Base 1st Digit)  31.4C  Wrist  2.0   24.4  Wrist Base 1st Digit     Ulnar Anti Sensory (5th Digit)  31.5C  Wrist  3.5   26.5  Wrist 5th Digit  40    Motor Left/Right Comparison  Stim Site L Lat (ms) R Lat (ms) L-R Lat (ms) L Amp (mV) R Amp (mV) L-R Amp (%) Site1 Site2 L Vel (m/s) R Vel (m/s) L-R Vel (m/s)  Median Motor (Abd Poll Brev)  31.6C  Wrist  3.1   5.1  Elbow Wrist  53   Elbow  6.9   4.9        Ulnar Motor (Abd Dig Min)  31.9C  Wrist  2.7   11.3  B Elbow Wrist  63   B Elbow  5.9   10.1  A Elbow B Elbow  63   A Elbow  7.5   9.8           Waveforms:

## 2019-06-10 NOTE — Patient Instructions (Addendum)
Avoid overhead lifting and overhead use of the arms. Do not lift greater than 10 lbs. Tylenol ES one every 6-8 hours for pain and inflamation. Due to ulcer disease we are giving you Hydrocodone a narcotic for pain control.  PT for right shoulder and cervical spine, then a home exercise program.  Avoid overhead lifting and overhead use of the arms. Do not lift greater than 5 lbs. Adjust head rest in vehicle to prevent hyperextension if rear ended. Take extra precautions to avoid falling, including use of a cane if you feel weak.  Hemp CBD capsules, amazon.com 5,000-7,000 mg per bottle, 60 capsules per bottle, take one capsule twice a day. Cane in the left hand to use with left leg weight bearing. Follow-Up Instructions: No follow-ups on file.

## 2019-06-10 NOTE — Telephone Encounter (Signed)
I called pt ---- she states that she needs a letter for the post office to be able to move her mailbox closer to her house. She states that it needs to state that it is a "hardship" for her that she can't walk the driveway to get the mail.

## 2019-06-10 NOTE — Progress Notes (Addendum)
Office Visit Note   Patient: Amy Moon           Date of Birth: 1951/03/22           MRN: [id number] Visit Date: 06/10/2019              Requested by: [name], NP [place],  [place] [address] PCP: [name], NP   Assessment & Plan: Visit Diagnoses:  1. Spondylosis without myelopathy or radiculopathy, cervical region   2. Adhesive capsulitis of right shoulder   3. Muscle weakness of right arm     Plan: Avoid overhead lifting and overhead use of the arms. Do not lift greater than 10 lbs. Tylenol ES one every 6-8 hours for pain and inflamation. Due to ulcer disease we are giving you Hydrocodone a narcotic for pain control.   PT for the right shoulder, then a home exercise program. Avoid overhead lifting and overhead use of the arms. Do not lift greater than 5 lbs. Adjust head rest in vehicle to prevent hyperextension if rear ended. Take extra precautions to avoid falling, including use of a cane if you feel weak.   Follow-Up Instructions: Return in about 4 weeks (around 07/08/2019).   Orders:  No orders of the defined types were placed in this encounter.  No orders of the defined types were placed in this encounter.     Procedures: No procedures performed   Clinical Data: No additional findings.   Subjective: Chief Complaint  Patient presents with  . Right Arm - Follow-up    Post EMG/NCS    68 year old right handed female with neck and arm complaints and pain with ROM of the neck and right shoulder. Last evaluation with injection of the cervical spiine did not  Seem to help though she is not sure. She has right shoulder stiffness and cervical spondylosis. The EMG and NCV done on the right arm are negative for muscle findings or NCV abnormalities. She has pain with overhead use of the right arm and shoulder and pain with sleeping on the right shoulder. She has not been to PT in the past for the right shoulder.   Review of  Systems  Constitutional: Negative.   HENT: Negative.   Eyes: Negative.   Respiratory: Negative.   Cardiovascular: Negative.   Gastrointestinal: Negative.   Endocrine: Negative.   Genitourinary: Negative.   Musculoskeletal: Negative.   Skin: Negative.   Allergic/Immunologic: Negative.   Neurological: Negative.   Hematological: Negative.   Psychiatric/Behavioral: Negative.      Objective: Vital Signs: BP (!) 141/81 (BP Location: Left Arm, Patient Position: Sitting)   Pulse 95   Ht 5' (1.524 m)   Wt 160 lb (72.6 kg)   BMI 31.25 kg/m   Physical Exam Constitutional:      Appearance: She is well-developed.  HENT:     Head: Normocephalic and atraumatic.  Eyes:     Pupils: Pupils are equal, round, and reactive to light.  Pulmonary:     Effort: Pulmonary effort is normal.     Breath sounds: Normal breath sounds.  Abdominal:     General: Bowel sounds are normal.     Palpations: Abdomen is soft.  Musculoskeletal:     Cervical back: Normal range of motion and neck supple.  Skin:    General: Skin is warm and dry.  Neurological:     Mental Status: She is alert and oriented to person, place, and time.  Psychiatric:        Behavior: Behavior normal.        Thought Content: Thought content normal.        Judgment: Judgment normal.     Right Shoulder Exam   Tenderness  The patient is experiencing tenderness in the acromioclavicular joint and acromion.  Range of Motion  Active abduction: 90  Passive abduction:  80 normal  Extension: 30  External rotation: 80  Forward flexion: 80  Internal rotation 0 degrees: abnormal   Muscle Strength  Abduction: 4/5  Internal rotation: 4/5  External rotation: 4/5  Supraspinatus: 5/5  Subscapularis: 5/5  Biceps: 5/5   Tests  Apprehension: positive Hawkins test: negative Cross arm: positive Impingement: positive Drop arm: negative Sulcus: absent  Other  Erythema: absent Scars: absent Sensation: normal Pulse:  present  Comments:  There is block to raising right shoulder higher than 90 degrees.       Specialty Comments:  No specialty comments available.  Imaging: No results found.   PMFS History: Patient Active Problem List   Diagnosis Date Noted  . Bronchitis 06/03/2018  . Encounter for Medicare annual wellness exam 05/09/2018  . Vitamin D deficiency 11/05/2017  . Encounter for hepatitis C virus screening test for high risk patient 11/05/2017  . Dysphagia 09/03/2017  . Thrombocytopenia ([place]) 09/03/2017  . Healthcare maintenance 07/30/2017  . Chronic pain syndrome 07/30/2017  . Chest pain 07/30/2017  . Parkinson's disease ([place]) 07/30/2017  . Hypertension 07/30/2017  . Severe obesity (BMI >= 40) ([place]) 05/15/2016  . Severe episode of recurrent major depressive disorder, without psychotic features ([place]) 05/15/2016  . Tremor of both hands 05/15/2016  . Irritant contact dermatitis 08/25/2015   Past Medical History:  Diagnosis Date  . Anemia   . Angina pectoris ([place])   . Arthritis   . Asthma    years ago  . Cancer (HCC)    squamous cell carcinoma, nose  . Cataract   . Chronic kidney disease    possibly per pt  . Cirrhosis ([place])   . Clotting disorder (HCC)    platelets are low  . Depression   . Drug-induced low platelet count   . Duodenal ulcer   . Enlarged thyroid   . Family history of adverse reaction to anesthesia    father had difficulty waking up and breathing on his own after surgery  . GERD (gastroesophageal reflux disease)   . Granulomatous disease ([place])   . Hepatic disease   . Hiatal hernia   . History of kidney stones   . Hypertension   . Low vitamin D level   . Neuropathy   . Osteopenia   . Osteoporosis   . Parkinson's disease ([place])   . Pneumonia   . Portal hypertension ([place])   . Splenomegaly a    Family History  Problem Relation Age of Onset  . Cancer Mother        ovarian  . Hyperlipidemia Mother   . Alzheimer's disease Mother   . Arthritis  Mother        rheumatoid  . Cancer Father        lung  . Parkinson's disease Father   . Alzheimer's disease Father   . ALS Sister   . Cancer Maternal Aunt        pancreatic, lung, liver,breast  . Breast cancer Maternal Aunt   . Cancer Maternal Uncle        pancreatic, liver, brain  . Heart attack Paternal Aunt   .  Stroke Paternal Aunt   . Heart attack Paternal Uncle   . Stroke Paternal Grandmother   . Heart attack Paternal Grandfather   . Arthritis Sister        rheumatoid  . Huntington's disease Daughter        presumed inherited from father per patient  . Colon cancer Maternal Uncle   . Breast cancer Maternal Aunt   . Breast cancer Maternal Aunt     Past Surgical History:  Procedure Laterality Date  . ABDOMINAL HYSTERECTOMY    . APPENDECTOMY    . BACK SURGERY     thoracic-fractured   . BACK SURGERY     lumbar - fractured  . BREAST LUMPECTOMY WITH RADIOACTIVE SEED LOCALIZATION Right 08/01/2018   Procedure: RADIOACTIVE SEED GUIDED RIGHT BREAST LUMPECTOMY;  Surgeon: Coralie Keens, MD;  Location: [facility];  Service: General;  Laterality: Right;  . ESOPHAGOGASTRODUODENOSCOPY  05/20/2008   Mild to moderate esophagitis. Otherwise normal EGD.   Marland Kitchen EYE SURGERY Left    cataract surgery with lens implant  . GASTROSTOMY     peptic ulcer - peg tube placement   Social History   Occupational History  . Not on file  Tobacco Use  . Smoking status: Former Smoker    Packs/day: 1.00    Years: 22.00    Pack years: 22.00    Types: Cigarettes    Quit date: 06/26/1986    Years since quitting: 32.9  . Smokeless tobacco: Never Used  Substance and Sexual Activity  . Alcohol use: No  . Drug use: No  . Sexual activity: Not Currently    Birth control/protection: None

## 2019-06-11 ENCOUNTER — Encounter: Payer: Self-pay | Admitting: Specialist

## 2019-06-13 ENCOUNTER — Other Ambulatory Visit: Payer: Self-pay | Admitting: Specialist

## 2019-06-13 ENCOUNTER — Telehealth (INDEPENDENT_AMBULATORY_CARE_PROVIDER_SITE_OTHER): Payer: Medicare HMO | Admitting: Gastroenterology

## 2019-06-13 ENCOUNTER — Other Ambulatory Visit: Payer: Self-pay

## 2019-06-13 VITALS — Ht 60.0 in | Wt 160.0 lb

## 2019-06-13 DIAGNOSIS — R1011 Right upper quadrant pain: Secondary | ICD-10-CM | POA: Diagnosis not present

## 2019-06-13 DIAGNOSIS — R1310 Dysphagia, unspecified: Secondary | ICD-10-CM

## 2019-06-13 MED ORDER — PANTOPRAZOLE SODIUM 40 MG PO TBEC: 40.0000 mg | DELAYED_RELEASE_TABLET | Freq: Every day | ORAL | 6 refills | Status: DC

## 2019-06-13 MED ORDER — ONDANSETRON 4 MG PO TBDP: 4.0000 mg | ORAL_TABLET | Freq: Four times a day (QID) | ORAL | 1 refills | Status: DC | PRN

## 2019-06-13 NOTE — Progress Notes (Signed)
IMPRESSION and PLAN:    #1. GERD with  dysphagia, GdA EE, 2 cm HH and Schatzki ring s/p EGD with dil 52Fr 03/2018 with resolution of dysphagia. H/O food impaction 2003 s/p endoscopic disimpaction. Has component of oropharyngeal dysphagia d/t ?  Parkinson's.  Had PEJ 2012 (removed 2017). Has postprandial epigastric discomfort and nausea.  #2. Colorectal cancer screening (refused colonoscopy)  #3. Cryptogenic early liver cirrhosis (Dx on [place] 02/2018, alb 4.6 2019) with borderline splenomegaly with borderline platelet count. Likely d/t NASH. No ETOH, neg Heb C. No varices on EGD 03/2018 or ascites on [place] 02/2018  #4. Nausea with RUQ pain  #5. Chronic constipation. Failed miralax, colace.  #6. Asymptomatic cholelithiasis.   Plan: - Change omeprazole to Protonix 40mg  po qd #30, 6 refills - EGD with dil off plavix/ranexa x 7 days after cardio clearence at [facility]. - Zofran 4mg  ODT Q6 hrs prn, #30, 1 refill - If still, with problems, Solid-phase gastric emptying scan to rule out gastroparesis. - CBC, CMP, AFP, PT, TSH.  - If still with problems and the above work-up is negative, proceed with CT abdo/pelvis followed by solid-phase GES. - FU in 12 weeks in person. Wants to hold off on colon.   HPI:    Chief Complaint:   Amy Moon is a 68 y.o. female  For follow-up visit (televisit) Has started having increasing heartburn despite omeprazole with associated dysphagia.  She did very well after esophageal dilatation 04/05/2018 and had complete resolution of dysphagia.  She would like to get repeat EGD. Has postprandial epigastric discomfort-feels like food stays in the stomach for a longer time.  She would sometimes burp up food eaten 24 hours before.  No gastric outlet obstruction on EGD.  She was scheduled to have a solid-phase gastric emptying scan but patient canceled. Also complains of nausea especially at night.  No vomiting. Also has been having right upper quadrant abdominal  discomfort.  No jaundice dark urine or pale stools. Has longstanding history of chronic constipation-bowel bowel movements at the frequency of 1 and 2 to 3 days.  Has tried MiraLAX and Colace without any significant relief.  Never had colonoscopy.  She is willing to get her colonoscopy performed.  No fever chills or night sweats Denies having any melena or hematochezia.  No change in mental status.  Has history of easy bruisability and has been on Plavix and Ranexa.  Being followed by Dr. Raliegh Ip.  No family history of colon cancer in a first-degree relative.   Past Medical History:  Diagnosis Date  . Angina pectoris ([place])   . Chronic kidney disease   . Drug-induced low platelet count   . Duodenal ulcer   . Granulomatous disease ([place])   . Hepatic disease   . Hypertension   . Low vitamin D level   . Neuropathy   . Osteopenia   . Parkinson's disease ([place])       . Splenomegaly a    Current Outpatient Medications  Medication Sig Dispense Refill  . acetaminophen (TYLENOL) 650 MG CR tablet Take 650 mg by mouth every 6 (six) hours as needed for pain.    . bisacodyl (DULCOLAX) 5 MG EC tablet Take 15 mg by mouth at bedtime as needed for moderate constipation.    . clopidogrel (PLAVIX) 75 MG tablet Take 1 tablet (75 mg total) by mouth daily. 90 tablet 2  . cyclobenzaprine (FLEXERIL) 10 MG tablet TAKE 1 TABLET BY MOUTH EVERY 8 HOURS AS  NEEDED. 30 tablet 0  . diphenhydrAMINE (BENADRYL) 25 MG tablet Take 25 mg by mouth every 6 (six) hours as needed.    . gabapentin (NEURONTIN) 100 MG capsule Take one capsule q AM and q Noon. 180 capsule 3  . gabapentin (NEURONTIN) 300 MG capsule Take 2 capsules (600 mg total) by mouth at bedtime. 180 capsule 3  . HYDROcodone-acetaminophen (NORCO/VICODIN) 5-325 MG tablet TAKE 1 TABLET BY MOUTH EVERY 6 HOURS AS NEEDED FOR MODERATE PAIN. 30 tablet 0  . nitroGLYCERIN (NITROSTAT) 0.4 MG SL tablet Place 0.4 mg under the tongue every 5 (five) minutes as needed for chest  pain.     Marland Kitchen omeprazole (PRILOSEC) 20 MG capsule TAKE 1 CAPSULE BY MOUTH 2 TIMES DAILY. 60 capsule 3  . ranolazine (RANEXA) 500 MG 12 hr tablet Take 1 tablet (500 mg total) by mouth 2 (two) times daily. 180 tablet 2  . Vitamin D, Ergocalciferol, (DRISDOL) 50000 units CAPS capsule Take 1 capsule (50,000 Units total) by mouth every 7 (seven) days. (Patient not taking: Reported on 06/11/2019) 16 capsule 0   No current facility-administered medications for this visit.    Past Surgical History:  Procedure Laterality Date  . ABDOMINAL HYSTERECTOMY    . APPENDECTOMY    . BACK SURGERY     thoracic-fractured   . BACK SURGERY     lumbar - fractured  . BREAST LUMPECTOMY WITH RADIOACTIVE SEED LOCALIZATION Right 08/01/2018   Procedure: RADIOACTIVE SEED GUIDED RIGHT BREAST LUMPECTOMY;  Surgeon: Coralie Keens, MD;  Location: [facility];  Service: General;  Laterality: Right;  . ESOPHAGOGASTRODUODENOSCOPY  05/20/2008   Mild to moderate esophagitis. Otherwise normal EGD.   Marland Kitchen EYE SURGERY Left    cataract surgery with lens implant  . GASTROSTOMY     peptic ulcer - peg tube placement    Family History  Problem Relation Age of Onset  . Cancer Mother        ovarian  . Hyperlipidemia Mother   . Alzheimer's disease Mother   . Arthritis Mother        rheumatoid  . Cancer Father        lung  . Parkinson's disease Father   . Alzheimer's disease Father   . ALS Sister   . Cancer Maternal Aunt        pancreatic, lung, liver,breast  . Breast cancer Maternal Aunt   . Cancer Maternal Uncle        pancreatic, liver, brain  . Heart attack Paternal Aunt   . Stroke Paternal Aunt   . Heart attack Paternal Uncle   . Stroke Paternal Grandmother   . Heart attack Paternal Grandfather   . Arthritis Sister        rheumatoid  . Huntington's disease Daughter        presumed inherited from father per patient  . Colon cancer Maternal Uncle   . Breast cancer Maternal Aunt   . Breast cancer Maternal Aunt      Social History   Tobacco Use  . Smoking status: Former Smoker    Packs/day: 1.00    Years: 22.00    Pack years: 22.00    Types: Cigarettes    Quit date: 06/26/1986    Years since quitting: 32.9  . Smokeless tobacco: Never Used  Substance Use Topics  . Alcohol use: No  . Drug use: No    Allergies  Allergen Reactions  . Nsaids Other (See Comments)    History of bleeding ulcers  . Latex  Hives and Rash  . Sinemet [Carbidopa W-Levodopa] Nausea And Vomiting  . Inderal [Propranolol] Other (See Comments)    Unknown  . Indomethacin Other (See Comments)    Unknown  . Penicillin G Other (See Comments)    Unknown  . Pneumococcal Vaccines Other (See Comments)    Caused "pneumonia"  . Scopolamine Other (See Comments)    The patch, caused her to pass out / change in mental status  . Tape Other (See Comments)    Unknown     Review of Systems: All systems reviewed and negative except where noted in HPI.    Physical Exam:     Ht 5' (1.524 m)   Wt 160 lb (72.6 kg)   BMI 31.25 kg/m  Not examined since it was a televisit.  . CBC Latest Ref Rng & Units 07/25/2018 07/01/2018 04/17/2018  WBC 4.0 - 10.5 K/uL 4.8 5.8 4.2  Hemoglobin 12.0 - 15.0 g/dL 12.5 13.3 12.9  Hematocrit 36.0 - 46.0 % 37.6 38.9 37.1  Platelets 150 - 400 K/uL 122(L) 119.0(L) 126(L)   CMP Latest Ref Rng & Units 07/25/2018 07/01/2018 08/06/2017  Glucose 70 - 99 mg/dL 94 84 104(H)  BUN 8 - 23 mg/dL 6(L) 10 7(L)  Creatinine 0.44 - 1.00 mg/dL 0.69 0.60 0.60  Sodium 135 - 145 mmol/L 140 140 143  Potassium 3.5 - 5.1 mmol/L 3.8 4.7 4.2  Chloride 98 - 111 mmol/L 107 106 104  CO2 22 - 32 mmol/L 25 28 24   Calcium 8.9 - 10.3 mg/dL 9.2 9.5 10.0  Total Protein 6.0 - 8.3 g/dL - 6.8 7.5  Total Bilirubin 0.2 - 1.2 mg/dL - 0.6 0.9  Alkaline Phos 39 - 117 U/L - 78 94  AST 0 - 37 U/L - 15 17  ALT 0 - 35 U/L - 13 17   I connected with  Amy Moon on 06/13/19 by a phone visit (video-did not work due to Internet  connection) and verified that I am speaking with the correct person using two identifiers.   I discussed the limitations of evaluation and management by telemedicine. The patient expressed understanding and agreed to proceed.     Sanjuana Mruk,MD 06/13/2019, 4:23 PM   CC [place], Berna Spare, NP

## 2019-06-13 NOTE — Patient Instructions (Signed)
If you are age 70 or older, your body mass index should be between 23-30. Your Body mass index is 31.25 kg/m. If this is out of the aforementioned range listed, please consider follow up with your Primary Care Provider.  If you are age 62 or younger, your body mass index should be between 19-25. Your Body mass index is 31.25 kg/m. If this is out of the aformentioned range listed, please consider follow up with your Primary Care Provider.   We have sent the following medications to your pharmacy for you to pick up at your convenience: Protonix  Zofran   You will be contacted by our office prior to your procedure for directions on holding your Plavix.  If you do not hear from our office 1 week prior to your scheduled procedure, please call [phone] to discuss.   It has been recommended to you by your physician that you have a(n) EGD completed. Per your request, we did not schedule the procedure(s) today. Please contact our office at [phone] should you decide to have the procedure completed. You will be scheduled for a pre-visit and procedure at that time.  Follow up 12 weeks.   Thank you,  Dr. Jackquline Denmark

## 2019-06-16 ENCOUNTER — Encounter: Payer: Self-pay | Admitting: Physical Medicine and Rehabilitation

## 2019-06-16 ENCOUNTER — Ambulatory Visit (INDEPENDENT_AMBULATORY_CARE_PROVIDER_SITE_OTHER): Payer: Medicare HMO | Admitting: Physical Medicine and Rehabilitation

## 2019-06-16 ENCOUNTER — Other Ambulatory Visit: Payer: Self-pay

## 2019-06-16 ENCOUNTER — Ambulatory Visit: Payer: Self-pay

## 2019-06-16 VITALS — BP 142/80 | HR 113

## 2019-06-16 DIAGNOSIS — M5412 Radiculopathy, cervical region: Secondary | ICD-10-CM | POA: Diagnosis not present

## 2019-06-16 DIAGNOSIS — M25511 Pain in right shoulder: Secondary | ICD-10-CM | POA: Diagnosis not present

## 2019-06-16 DIAGNOSIS — M542 Cervicalgia: Secondary | ICD-10-CM

## 2019-06-16 DIAGNOSIS — G8929 Other chronic pain: Secondary | ICD-10-CM

## 2019-06-16 DIAGNOSIS — M47812 Spondylosis without myelopathy or radiculopathy, cervical region: Secondary | ICD-10-CM

## 2019-06-16 DIAGNOSIS — R29898 Other symptoms and signs involving the musculoskeletal system: Secondary | ICD-10-CM

## 2019-06-16 MED ORDER — BUPIVACAINE HCL 0.25 % IJ SOLN: 2.0000 mL | Freq: Once | INTRAMUSCULAR | Status: DC

## 2019-06-16 NOTE — Progress Notes (Signed)
Right C4 and C5, Pt states pain in the right shoulder and right arm and sometimes tingling in right hand. Pt states symptomts started a month ago. Pt states moving makes pain worse and resting helps with pain.   .Numeric Pain Rating Scale and Functional Assessment Average Pain 9   In the last MONTH (on 0-10 scale) has pain interfered with the following?  1. General activity like being  able to carry out your everyday physical activities such as walking, climbing stairs, carrying groceries, or moving a chair?  Rating(9)   +Driver, +BT(plavix, pt stopped 06/09/2019), -Dye Allergies.

## 2019-06-16 NOTE — Telephone Encounter (Signed)
Please advise 

## 2019-06-17 ENCOUNTER — Telehealth: Payer: Self-pay

## 2019-06-17 ENCOUNTER — Ambulatory Visit: Payer: Medicare HMO | Admitting: Adult Health

## 2019-06-17 DIAGNOSIS — Z0000 Encounter for general adult medical examination without abnormal findings: Secondary | ICD-10-CM

## 2019-06-17 NOTE — Telephone Encounter (Signed)
[facility] Pre-operative Risk Assessment     Request for surgical clearance:     Endoscopy Procedure  What type of surgery is being performed?     EGD   When is this surgery scheduled?     Not yet scheduled   What type of clearance is required ?   Pharmacy  Are there any medications that need to be held prior to surgery and how long? Plavix & Ranexa  Practice name and name of physician performing surgery?      [facility]  What is your office phone and fax number?      Phone- [phone]  [phone]  Anesthesia type (None, local, MAC, general) ?       MAC

## 2019-06-17 NOTE — Progress Notes (Signed)
Subjective:   Amy Moon is a 68 y.o. female who presents for Medicare Annual (Subsequent) preventive examination.   Review of Systems: General:   Denies fever, chills, unexplained weight loss.  Optho/Auditory:   Denies visual changes, blurred vision/LOV Respiratory:   Denies SOB, DOE more than baseline levels.  Cardiovascular:   Denies chest pain, palpitations, new onset peripheral edema  Gastrointestinal:   Denies nausea, vomiting, diarrhea.  Genitourinary: Denies dysuria, freq/ urgency, flank pain or discharge from genitals.  Endocrine:     Denies hot or cold intolerance, polyuria, polydipsia. Musculoskeletal:   Denies unexplained myalgias, joint swelling, unexplained arthralgias, gait problems.  Skin:  Denies rash, suspicious lesions Neurological:     Denies dizziness, unexplained weakness, numbness  Psychiatric/Behavioral:   Denies mood changes, suicidal or homicidal ideations, hallucinations          Objective:     Vitals: There were no vitals taken for this visit.  There is no height or weight on file to calculate BMI.  Advanced Directives 07/25/2018 07/30/2017 11/27/2015 11/26/2015 10/30/2015  Does Patient Have a Medical Advance Directive? No No No No No  Would patient like information on creating a medical advance directive? No - Patient declined Yes (MAU/Ambulatory/Procedural Areas - Information given) No - patient declined information No - patient declined information -    Tobacco Social History   Tobacco Use  Smoking Status Former Smoker  . Packs/day: 1.00  . Years: 22.00  . Pack years: 22.00  . Types: Cigarettes  . Quit date: 06/26/1986  . Years since quitting: 32.9  Smokeless Tobacco Never Used     Counseling given: Not Answered    Past Medical History:  Diagnosis Date  . Anemia   . Angina pectoris ([place])   . Arthritis   . Asthma    years ago  . Cancer (HCC)    squamous cell carcinoma, nose  . Cataract   . Chronic kidney disease    possibly per  pt  . Cirrhosis ([place])   . Clotting disorder (HCC)    platelets are low  . Depression   . Drug-induced low platelet count   . Duodenal ulcer   . Enlarged thyroid   . Family history of adverse reaction to anesthesia    father had difficulty waking up and breathing on his own after surgery  . GERD (gastroesophageal reflux disease)   . Granulomatous disease ([place])   . Hepatic disease   . Hiatal hernia   . History of kidney stones   . Hypertension   . Low vitamin D level   . Neuropathy   . Osteopenia   . Osteoporosis   . Parkinson's disease ([place])   . Pneumonia   . Portal hypertension ([place])   . Splenomegaly a   Past Surgical History:  Procedure Laterality Date  . ABDOMINAL HYSTERECTOMY    . APPENDECTOMY    . BACK SURGERY     thoracic-fractured   . BACK SURGERY     lumbar - fractured  . BREAST LUMPECTOMY WITH RADIOACTIVE SEED LOCALIZATION Right 08/01/2018   Procedure: RADIOACTIVE SEED GUIDED RIGHT BREAST LUMPECTOMY;  Surgeon: Coralie Keens, MD;  Location: [facility];  Service: General;  Laterality: Right;  . ESOPHAGOGASTRODUODENOSCOPY  05/20/2008   Mild to moderate esophagitis. Otherwise normal EGD.   Marland Kitchen EYE SURGERY Left    cataract surgery with lens implant  . GASTROSTOMY     peptic ulcer - peg tube placement   Family History  Problem Relation Age  of Onset  . Cancer Mother        ovarian  . Hyperlipidemia Mother   . Alzheimer's disease Mother   . Arthritis Mother        rheumatoid  . Cancer Father        lung  . Parkinson's disease Father   . Alzheimer's disease Father   . ALS Sister   . Cancer Maternal Aunt        pancreatic, lung, liver,breast  . Breast cancer Maternal Aunt   . Cancer Maternal Uncle        pancreatic, liver, brain  . Heart attack Paternal Aunt   . Stroke Paternal Aunt   . Heart attack Paternal Uncle   . Stroke Paternal Grandmother   . Heart attack Paternal Grandfather   . Arthritis Sister        rheumatoid  . Huntington's disease Daughter         presumed inherited from father per patient  . Colon cancer Maternal Uncle   . Breast cancer Maternal Aunt   . Breast cancer Maternal Aunt    Social History   Socioeconomic History  . Marital status: Married    Spouse name: Not on file  . Number of children: 3  . Years of education: Not on file  . Highest education level: Not on file  Occupational History  . Not on file  Tobacco Use  . Smoking status: Former Smoker    Packs/day: 1.00    Years: 22.00    Pack years: 22.00    Types: Cigarettes    Quit date: 06/26/1986    Years since quitting: 32.9  . Smokeless tobacco: Never Used  Substance and Sexual Activity  . Alcohol use: No  . Drug use: No  . Sexual activity: Not Currently    Birth control/protection: None  Other Topics Concern  . Not on file  Social History Narrative  . Not on file   Social Determinants of Health   Financial Resource Strain:   . Difficulty of Paying Living Expenses: Not on file  Food Insecurity:   . Worried About Charity fundraiser in the Last Year: Not on file  . Ran Out of Food in the Last Year: Not on file  Transportation Needs:   . Lack of Transportation (Medical): Not on file  . Lack of Transportation (Non-Medical): Not on file  Physical Activity:   . Days of Exercise per Week: Not on file  . Minutes of Exercise per Session: Not on file  Stress:   . Feeling of Stress : Not on file  Social Connections:   . Frequency of Communication with Friends and Family: Not on file  . Frequency of Social Gatherings with Friends and Family: Not on file  . Attends Religious Services: Not on file  . Active Member of Clubs or Organizations: Not on file  . Attends Archivist Meetings: Not on file  . Marital Status: Not on file    Outpatient Encounter Medications as of 06/17/2019  Medication Sig  . acetaminophen (TYLENOL) 650 MG CR tablet Take 650 mg by mouth every 6 (six) hours as needed for pain.  . bisacodyl (DULCOLAX) 5 MG EC tablet  Take 15 mg by mouth at bedtime as needed for moderate constipation.  . clopidogrel (PLAVIX) 75 MG tablet Take 1 tablet (75 mg total) by mouth daily.  . cyclobenzaprine (FLEXERIL) 10 MG tablet TAKE 1 TABLET BY MOUTH EVERY 8 HOURS AS NEEDED.  Marland Kitchen diphenhydrAMINE (  BENADRYL) 25 MG tablet Take 25 mg by mouth every 6 (six) hours as needed.  . gabapentin (NEURONTIN) 100 MG capsule Take one capsule q AM and q Noon.  . gabapentin (NEURONTIN) 300 MG capsule Take 2 capsules (600 mg total) by mouth at bedtime.  Marland Kitchen HYDROcodone-acetaminophen (NORCO/VICODIN) 5-325 MG tablet TAKE 1 TABLET BY MOUTH EVERY 6 HOURS AS NEEDED FOR MODERATE PAIN.  . nitroGLYCERIN (NITROSTAT) 0.4 MG SL tablet Place 0.4 mg under the tongue every 5 (five) minutes as needed for chest pain.   Marland Kitchen ondansetron (ZOFRAN ODT) 4 MG disintegrating tablet Take 1 tablet (4 mg total) by mouth every 6 (six) hours as needed for nausea or vomiting.  . pantoprazole (PROTONIX) 40 MG tablet Take 1 tablet (40 mg total) by mouth daily.  . ranolazine (RANEXA) 500 MG 12 hr tablet Take 1 tablet (500 mg total) by mouth 2 (two) times daily.  . [DISCONTINUED] omeprazole (PRILOSEC) 20 MG capsule TAKE 1 CAPSULE BY MOUTH 2 TIMES DAILY.  . [DISCONTINUED] Vitamin D, Ergocalciferol, (DRISDOL) 50000 units CAPS capsule Take 1 capsule (50,000 Units total) by mouth every 7 (seven) days.   Facility-Administered Encounter Medications as of 06/17/2019  Medication  . bupivacaine (MARCAINE) 0.25 % (with pres) injection 2 mL    Activities of Daily Living In your present state of health, do you have any difficulty performing the following activities: 06/17/2019 07/25/2018  Hearing? Y N  Vision? 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 Y N  Walking or climbing stairs? Y Y  Comment - has bad balance  Dressing or bathing? N N  Doing errands, shopping? N N  Some recent data might be hidden    Patient Care Team: [name], NP as PCP  - General (Family Medicine) Park Liter, MD as PCP - Cardiology (Cardiology) Jackquline Denmark, MD as Consulting Physician (Gastroenterology)    Assessment:   This is a routine wellness examination for [place].  Exercise Activities and Dietary recommendations   Remain as active as tolerated  Fall Risk Fall Risk  06/17/2019 05/09/2018 11/12/2017 09/03/2017 07/30/2017  Falls in the past year? 1 1 Yes Yes Yes  Number falls in past yr: 1 1 2  or more 2 or more 2 or more  Injury with Fall? 0 0 No Yes Yes  Risk Factor Category  - - High Fall Risk High Fall Risk -  Risk for fall due to : History of fall(s) Impaired balance/gait - - -  Follow up Falls evaluation completed Falls evaluation completed;Education provided;Falls prevention discussed Falls evaluation completed Falls prevention discussed;Education provided -   Is the patient's home free of loose throw rugs in walkways, pet beds, electrical cords, etc?   no      Grab bars in the bathroom? yes      Handrails on the stairs?   yes      Adequate lighting?   yes   Depression Screen PHQ 2/9 Scores 06/17/2019 06/03/2018 05/09/2018 11/05/2017  PHQ - 2 Score 2 3 3 1   PHQ- 9 Score 12 18 12 7      Cognitive Function     6CIT Screen 06/17/2019 05/09/2018  What Year? 0 points 0 points  What month? 0 points 0 points  What time? 3 points 0 points  Count back from 20 2 points 0 points  Months in reverse 4 points 2 points  Repeat phrase 2 points 4 points  Total Score 11 6  There is no immunization history on file for this patient.  Qualifies for Shingles Vaccine? Yes  Screening Tests Health Maintenance  Topic Date Due  . TETANUS/TDAP  07/05/1967  . COLONOSCOPY  07/04/1998  . INFLUENZA VACCINE  01/25/2019  . MAMMOGRAM  06/25/2020  . DEXA SCAN  Completed  . Hepatitis C Screening  Completed    Cancer Screenings: Lung: Low Dose CT Chest recommended if Age 17-80 years, 30 pack-year currently smoking OR have quit w/in 15years.  Patient does not qualify. Breast:  Up to date on Mammogram? Yes   Up to date of Bone Density/Dexa? Yes Colorectal: Due--pt will schedule in 2021- currently established with Dr. Lyndel Safe [facility]  Additional Screenings:  Hepatitis C Screening:  Negative on 11/05/2017     Plan:  Continue all medications as directed.  Continue with specialists as directed. Remain well hydrated, follow Heart Healthy diet. Continue to social distance and wear a mask when in public.  I have personally reviewed and noted the following in the patient's chart:   . Medical and social history . Use of alcohol, tobacco or illicit drugs  . Current medications and supplements . Functional ability and status . Nutritional status . Physical activity . Advanced directives . List of other physicians . Hospitalizations, surgeries, and ER visits in previous 12 months . Vitals . Screenings to include cognitive, depression, and falls . Referrals and appointments  In addition, I have reviewed and discussed with patient certain preventive protocols, quality metrics, and best practice recommendations. A written personalized care plan for preventive services as well as general preventive health recommendations were provided to patient.     Jennet Maduro, [place]  06/17/2019

## 2019-06-17 NOTE — Telephone Encounter (Signed)
I have called the patient and she is OK for procedure. Plavix can be held as requested as she has a history of myocardial ischemia with no coronary artery disease, no prior MI or coronary intervention. Her chest pain is controlled with Ranexa. There is a request to hold Ranexa for the procedure. I find no indication to hold this or for how long. The patient feels that if she holds the Ranexa she will have chest pain.   I will route to our preop pharmacy for input on holding Ranexa.    Once I receive a response I will send clearance note to the requesting provider.

## 2019-06-17 NOTE — Telephone Encounter (Signed)
I have never heard of holding Ranexa before a procedure. Would clarify with requesting office that they actually want pt to hold Ranexa. If so, would need MD input as this is an unusual request.

## 2019-06-17 NOTE — Telephone Encounter (Signed)
Opened in error

## 2019-06-17 NOTE — Telephone Encounter (Signed)
Dr. Lyndel Safe please specify if you would like to hold Ranexa and Plavix. Per your last note on 06/13/19 this is what you had requested.

## 2019-06-17 NOTE — Telephone Encounter (Signed)
   Primary Cardiologist: Jenne Campus, MD  Chart reviewed as part of pre-operative protocol coverage. Patient was contacted 06/17/2019 in reference to pre-operative risk assessment for pending surgery as outlined below.  Amy Moon was last seen on 04/11/2019 by Laurann Montana, NP.  Since that day, Amy Moon has done well with no new cardiac complaints.  Therefore, based on ACC/AHA guidelines, the patient would be at acceptable risk for the planned procedure without further cardiovascular testing.   Plavix can be held as requested as she has a history of myocardial ischemia with no coronary artery disease, no prior MI or coronary intervention. Her chest pain is controlled with Ranexa.   There is a request to hold Ranexa for the procedure. I find no specific indication to hold this. The patient feels that if she holds the Ranexa she will have chest pain. If Ranexa needs to be held, please give details and we will consult with Dr. Agustin Cree.   I will route this recommendation to the requesting party via Epic fax function and remove from pre-op pool.  Please call with questions.  Daune Perch, NP 06/17/2019, 1:59 PM

## 2019-06-17 NOTE — Telephone Encounter (Signed)
Clarification-continue Ranexa Hold Plavix 5 days before  RG

## 2019-06-18 ENCOUNTER — Other Ambulatory Visit: Payer: Medicare HMO

## 2019-06-18 ENCOUNTER — Other Ambulatory Visit: Payer: Self-pay

## 2019-06-18 DIAGNOSIS — E559 Vitamin D deficiency, unspecified: Secondary | ICD-10-CM

## 2019-06-18 DIAGNOSIS — Z0000 Encounter for general adult medical examination without abnormal findings: Secondary | ICD-10-CM

## 2019-06-18 DIAGNOSIS — D696 Thrombocytopenia, unspecified: Secondary | ICD-10-CM

## 2019-06-18 DIAGNOSIS — I10 Essential (primary) hypertension: Secondary | ICD-10-CM

## 2019-06-19 LAB — COMPREHENSIVE METABOLIC PANEL
ALT: 12 IU/L (ref 0–32)
AST: 17 IU/L (ref 0–40)
Albumin/Globulin Ratio: 1.8 (ref 1.2–2.2)
Albumin: 4.5 g/dL (ref 3.8–4.8)
Alkaline Phosphatase: 89 IU/L (ref 39–117)
BUN/Creatinine Ratio: 11 — ABNORMAL LOW (ref 12–28)
BUN: 9 mg/dL (ref 8–27)
Bilirubin Total: 0.9 mg/dL (ref 0.0–1.2)
CO2: 24 mmol/L (ref 20–29)
Calcium: 9.4 mg/dL (ref 8.7–10.3)
Chloride: 102 mmol/L (ref 96–106)
Creatinine, Ser: 0.79 mg/dL (ref 0.57–1.00)
GFR calc Af Amer: 88 mL/min/{1.73_m2} (ref >59)
GFR calc non Af Amer: 76 mL/min/{1.73_m2} (ref >59)
Globulin, Total: 2.5 g/dL (ref 1.5–4.5)
Glucose: 98 mg/dL (ref 65–99)
Potassium: 3.6 mmol/L (ref 3.5–5.2)
Sodium: 141 mmol/L (ref 134–144)
Total Protein: 7 g/dL (ref 6.0–8.5)

## 2019-06-19 LAB — CBC WITH DIFFERENTIAL/PLATELET
Basophils Absolute: 0.1 10*3/uL (ref 0.0–0.2)
Basos: 1 %
EOS (ABSOLUTE): 0.2 10*3/uL (ref 0.0–0.4)
Eos: 4 %
Hematocrit: 39.5 % (ref 34.0–46.6)
Hemoglobin: 13.9 g/dL (ref 11.1–15.9)
Immature Grans (Abs): 0 10*3/uL (ref 0.0–0.1)
Immature Granulocytes: 0 %
Lymphocytes Absolute: 2.5 10*3/uL (ref 0.7–3.1)
Lymphs: 46 %
MCH: 33.8 pg — ABNORMAL HIGH (ref 26.6–33.0)
MCHC: 35.2 g/dL (ref 31.5–35.7)
MCV: 96 fL (ref 79–97)
Monocytes Absolute: 0.4 10*3/uL (ref 0.1–0.9)
Monocytes: 8 %
Neutrophils Absolute: 2.2 10*3/uL (ref 1.4–7.0)
Neutrophils: 41 %
Platelets: 166 10*3/uL (ref 150–450)
RBC: 4.11 x10E6/uL (ref 3.77–5.28)
RDW: 11.9 % (ref 11.7–15.4)
WBC: 5.4 10*3/uL (ref 3.4–10.8)

## 2019-06-19 LAB — VITAMIN D 25 HYDROXY (VIT D DEFICIENCY, FRACTURES): Vit D, 25-Hydroxy: 19.9 ng/mL — ABNORMAL LOW (ref 30.0–100.0)

## 2019-06-19 LAB — HEMOGLOBIN A1C
Est. average glucose Bld gHb Est-mCnc: 91 mg/dL
Hgb A1c MFr Bld: 4.8 % (ref 4.8–5.6)

## 2019-06-19 LAB — LIPID PANEL
Chol/HDL Ratio: 3.5 ratio (ref 0.0–4.4)
Cholesterol, Total: 191 mg/dL (ref 100–199)
HDL: 54 mg/dL (ref >39)
LDL Chol Calc (NIH): 121 mg/dL — ABNORMAL HIGH (ref 0–99)
Triglycerides: 87 mg/dL (ref 0–149)
VLDL Cholesterol Cal: 16 mg/dL (ref 5–40)

## 2019-06-19 LAB — TSH: TSH: 0.254 u[IU]/mL — ABNORMAL LOW (ref 0.450–4.500)

## 2019-06-23 ENCOUNTER — Telehealth: Payer: Self-pay | Admitting: Gastroenterology

## 2019-06-23 ENCOUNTER — Other Ambulatory Visit: Payer: Self-pay | Admitting: Adult Health

## 2019-06-23 DIAGNOSIS — Z1211 Encounter for screening for malignant neoplasm of colon: Secondary | ICD-10-CM

## 2019-06-23 DIAGNOSIS — K5900 Constipation, unspecified: Secondary | ICD-10-CM

## 2019-06-23 DIAGNOSIS — R1310 Dysphagia, unspecified: Secondary | ICD-10-CM

## 2019-06-23 DIAGNOSIS — E559 Vitamin D deficiency, unspecified: Secondary | ICD-10-CM

## 2019-06-23 DIAGNOSIS — Z1159 Encounter for screening for other viral diseases: Secondary | ICD-10-CM

## 2019-06-23 DIAGNOSIS — R1011 Right upper quadrant pain: Secondary | ICD-10-CM

## 2019-06-23 MED ORDER — VITAMIN D (ERGOCALCIFEROL) 1.25 MG (50000 UNIT) PO CAPS: 50000.0000 [IU] | ORAL_CAPSULE | ORAL | 0 refills | Status: DC

## 2019-06-23 NOTE — Telephone Encounter (Signed)
She should get both EGD and colonoscopy done as per last note  Thx  RG

## 2019-06-23 NOTE — Telephone Encounter (Signed)
Amy Moon, once Dr. Lyndel Safe clarifies the request to hold Ranexa she would like to add on the colonoscopy to EGD as discussed in the office visit.

## 2019-06-23 NOTE — Telephone Encounter (Signed)
Would you like to add the colonoscopy to the EGD as well?

## 2019-06-24 NOTE — Telephone Encounter (Signed)
I have called patient and left message to return my call.

## 2019-06-25 ENCOUNTER — Ambulatory Visit: Payer: Medicare HMO | Admitting: Specialist

## 2019-06-25 MED ORDER — SUPREP BOWEL PREP KIT 17.5-3.13-1.6 GM/177ML PO SOLN: 1.0000 | ORAL | 0 refills | Status: DC

## 2019-06-25 NOTE — Telephone Encounter (Signed)
I have called patient and scheduled her for her EGD/Colon and mailed instructions to her.

## 2019-06-30 ENCOUNTER — Other Ambulatory Visit: Payer: Self-pay | Admitting: Specialist

## 2019-07-10 ENCOUNTER — Other Ambulatory Visit: Payer: Self-pay | Admitting: Specialist

## 2019-07-14 ENCOUNTER — Ambulatory Visit (INDEPENDENT_AMBULATORY_CARE_PROVIDER_SITE_OTHER): Payer: Medicare Other

## 2019-07-14 ENCOUNTER — Telehealth: Payer: Self-pay | Admitting: *Deleted

## 2019-07-14 ENCOUNTER — Other Ambulatory Visit: Payer: Self-pay | Admitting: Gastroenterology

## 2019-07-14 DIAGNOSIS — Z1159 Encounter for screening for other viral diseases: Secondary | ICD-10-CM

## 2019-07-14 NOTE — Telephone Encounter (Signed)
Opened in error

## 2019-07-15 ENCOUNTER — Telehealth: Payer: Self-pay | Admitting: Gastroenterology

## 2019-07-15 LAB — SARS CORONAVIRUS 2 (TAT 6-24 HRS): SARS Coronavirus 2: NEGATIVE

## 2019-07-15 NOTE — Telephone Encounter (Signed)
Ive instructed the patient that Miralax is over the counter.

## 2019-07-15 NOTE — Telephone Encounter (Signed)
Patient picked up prep at pharmacy. States that she picked up the suprep- she states it is all liquid in the kit and no powder. She does not have the miralax. Asking if she can get it called in. Her colon/endo is tomorrow 1/20.

## 2019-07-16 ENCOUNTER — Ambulatory Visit (AMBULATORY_SURGERY_CENTER): Payer: Medicare Other | Admitting: Gastroenterology

## 2019-07-16 ENCOUNTER — Encounter: Payer: Self-pay | Admitting: Gastroenterology

## 2019-07-16 ENCOUNTER — Other Ambulatory Visit: Payer: Self-pay

## 2019-07-16 VITALS — BP 140/68 | HR 84 | Temp 98.8°F | Resp 13 | Ht 60.0 in | Wt 160.0 lb

## 2019-07-16 DIAGNOSIS — K648 Other hemorrhoids: Secondary | ICD-10-CM

## 2019-07-16 DIAGNOSIS — K5730 Diverticulosis of large intestine without perforation or abscess without bleeding: Secondary | ICD-10-CM

## 2019-07-16 DIAGNOSIS — K449 Diaphragmatic hernia without obstruction or gangrene: Secondary | ICD-10-CM

## 2019-07-16 DIAGNOSIS — D123 Benign neoplasm of transverse colon: Secondary | ICD-10-CM

## 2019-07-16 DIAGNOSIS — K5900 Constipation, unspecified: Secondary | ICD-10-CM | POA: Diagnosis not present

## 2019-07-16 DIAGNOSIS — R1310 Dysphagia, unspecified: Secondary | ICD-10-CM | POA: Diagnosis not present

## 2019-07-16 DIAGNOSIS — K228 Other specified diseases of esophagus: Secondary | ICD-10-CM | POA: Diagnosis not present

## 2019-07-16 DIAGNOSIS — Z1211 Encounter for screening for malignant neoplasm of colon: Secondary | ICD-10-CM

## 2019-07-16 DIAGNOSIS — K2100 Gastro-esophageal reflux disease with esophagitis, without bleeding: Secondary | ICD-10-CM | POA: Diagnosis not present

## 2019-07-16 DIAGNOSIS — K222 Esophageal obstruction: Secondary | ICD-10-CM | POA: Diagnosis not present

## 2019-07-16 DIAGNOSIS — R1011 Right upper quadrant pain: Secondary | ICD-10-CM

## 2019-07-16 DIAGNOSIS — K2950 Unspecified chronic gastritis without bleeding: Secondary | ICD-10-CM

## 2019-07-16 MED ORDER — PANTOPRAZOLE SODIUM 40 MG PO TBEC: 40.0000 mg | DELAYED_RELEASE_TABLET | Freq: Two times a day (BID) | ORAL | 6 refills | Status: DC

## 2019-07-16 MED ORDER — SODIUM CHLORIDE 0.9 % IV SOLN: 500.0000 mL | Freq: Once | INTRAVENOUS | Status: DC

## 2019-07-16 NOTE — Op Note (Signed)
[facility] Patient Name: Amy Moon Procedure Date: 07/16/2019 10:44 AM MRN: [id number] Endoscopist: Jackquline Denmark , MD Age: 69 Referring MD:  Date of Birth: [birth year] Gender: Female Account #: [id number] Procedure:                Colonoscopy Indications:              Screening for colorectal malignant neoplasm. H/O                            constipation. Medicines:                Monitored Anesthesia Care Procedure:                Pre-Anesthesia Assessment:                           - Prior to the procedure, a History and Physical                            was performed, and patient medications and                            allergies were reviewed. The patient's tolerance of                            previous anesthesia was also reviewed. The risks                            and benefits of the procedure and the sedation                            options and risks were discussed with the patient.                            All questions were answered, and informed consent                            was obtained. Prior Anticoagulants: The patient has                            taken Plavix (clopidogrel), last dose was 5 days                            prior to procedure. ASA Grade Assessment: III - A                            patient with severe systemic disease. After                            reviewing the risks and benefits, the patient was                            deemed in satisfactory condition to undergo the  procedure.                           After obtaining informed consent, the colonoscope                            was passed under direct vision. Throughout the                            procedure, the patient's blood pressure, pulse, and                            oxygen saturations were monitored continuously. The                            Colonoscope was introduced through the anus and                            advanced  to the the cecum, identified by                            appendiceal orifice and ileocecal valve. Thereafter                            2 cm of TI was intubated. The colonoscopy was                            somewhat difficult due to a redundant colon.                            Successful completion of the procedure was aided by                            applying abdominal pressure. The patient tolerated                            the procedure well. The quality of the bowel                            preparation was good. The ileocecal valve,                            appendiceal orifice, TI, and rectum were                            photographed. Scope In: 11:05:11 AM Scope Out: 11:25:37 AM Scope Withdrawal Time: 0 hours 9 minutes 48 seconds  Total Procedure Duration: 0 hours 20 minutes 26 seconds  Findings:                 A 6 mm polyp was found in the mid transverse colon.                            The polyp was sessile. The polyp was removed with a  cold snare. Resection and retrieval were complete.                            Estimated blood loss: none.                           A few small-mouthed diverticula were found in the                            sigmoid colon.                           Non-bleeding internal hemorrhoids were found during                            retroflexion. The hemorrhoids were moderate.                           The exam was otherwise without abnormality on                            direct and retroflexion views. Complications:            No immediate complications. Estimated Blood Loss:     Estimated blood loss: none. Impression:               - One 6 mm polyp in the mid transverse colon,                            removed with a cold snare. Resected and retrieved.                           - Minimal sigmoid diverticulosis                           - Non-bleeding internal hemorrhoids.                           -  Otherwise normal colonoscopy to TI. The colon was                            highly redundant. Recommendation:           - Patient has a contact number available for                            emergencies. The signs and symptoms of potential                            delayed complications were discussed with the                            patient. Return to normal activities tomorrow.                            Written discharge instructions were provided to the  patient.                           - Resume previous diet.                           - Continue present medications.                           - Miralax 1 capful (17 grams) in 8 ounces of water                            PO daily.                           - Repeat colonoscopy for surveillance based on                            pathology results.                           - Resume Plavix (clopidogrel) at prior dose in 3                            days. Jackquline Denmark, MD 07/16/2019 11:33:11 AM This report has been signed electronically.

## 2019-07-16 NOTE — Op Note (Signed)
[place] Patient Name: Amy Moon Procedure Date: 07/16/2019 10:47 AM MRN: [id number] Endoscopist: Jackquline Denmark , MD Age: 69 Referring MD:  Date of Birth: 01/27/1951 Gender: Female Account #: [id number] Procedure:                Upper GI endoscopy Indications:              Dysphagia Medicines:                Monitored Anesthesia Care Procedure:                Pre-Anesthesia Assessment:                           - Prior to the procedure, a History and Physical                            was performed, and patient medications and                            allergies were reviewed. The patient's tolerance of                            previous anesthesia was also reviewed. The risks                            and benefits of the procedure and the sedation                            options and risks were discussed with the patient.                            All questions were answered, and informed consent                            was obtained. Prior Anticoagulants: The patient has                            taken Plavix (clopidogrel), last dose was 5 days                            prior to procedure. ASA Grade Assessment: III - A                            patient with severe systemic disease. After                            reviewing the risks and benefits, the patient was                            deemed in satisfactory condition to undergo the                            procedure.  After obtaining informed consent, the endoscope was                            passed under direct vision. Throughout the                            procedure, the patient's blood pressure, pulse, and                            oxygen saturations were monitored continuously. The                            Endoscope was introduced through the mouth, and                            advanced to the second part of duodenum. The upper                            GI  endoscopy was accomplished without difficulty.                            The patient tolerated the procedure well. Scope In: Scope Out: Findings:                 One benign-appearing, intrinsic mild stenosis was                            found 35 cm from the incisors, at GE junction. This                            stenosis measured 1.4 cm (inner diameter). The                            stenosis was traversed. Surrounding grade A erosive                            esophagitis. The scope was withdrawn. Dilation was                            performed with a Maloney dilator with mild                            resistance at 50 Fr. Biopsies were taken with a                            cold forceps for histology from distal esophagus,                            directed by NBI. No esophageal varices. Incidental                            note was made of a small inlet patch in cervical  esophagus.                           A small transient hiatal hernia was present. Best                            visualized after full distention of the lower                            esophagus. The gastric mucosa was normal. Biopsies                            were taken with a cold forceps for histology.                            Retroflexed examination of the cardia did not                            reveal any fundal varices.                           The examined duodenum was normal. Biopsies were                            taken with a cold forceps for histology. Complications:            No immediate complications. Estimated Blood Loss:     Estimated blood loss: none. Impression:               -Distal esophageal stricture with surrounding grade                            A esophagitis s/p dilatation and biopsied                           -Small hiatal hernia.                           -No esophageal or fundal varices. Recommendation:           - Patient has a contact  number available for                            emergencies. The signs and symptoms of potential                            delayed complications were discussed with the                            patient. Return to normal activities tomorrow.                            Written discharge instructions were provided to the                            patient.                           -  Post dilatation diet.                           - Increase Protonix 40 mg p.o. twice daily x 8                            weeks, then once a day.                           - Await pathology results.                           - Resume Plavix (clopidogrel) at prior dose in 3                            days.                           - Await pathology results.                           - Needs to chew food especially meats and breads                            well and eat slowly.                           - FU in GI clinic in 8 weeks.                           - D/W Yvone Neu. Jackquline Denmark, MD 07/16/2019 11:41:16 AM This report has been signed electronically.

## 2019-07-16 NOTE — Progress Notes (Signed)
Pt tolerated well. VSS. Awake to recovery.

## 2019-07-16 NOTE — Progress Notes (Signed)
Temp-LC VS-DT 

## 2019-07-16 NOTE — Patient Instructions (Signed)
YOU HAD AN ENDOSCOPIC PROCEDURE TODAY AT [facility]:   Refer to the procedure report that was given to you for any specific questions about what was found during the examination.  If the procedure report does not answer your questions, please call your gastroenterologist to clarify.  If you requested that your care partner not be given the details of your procedure findings, then the procedure report has been included in a sealed envelope for you to review at your convenience later.   **Handouts given on polyps, hemorrhoids, diverticulosis and post dilation diet**  **START PLAVIX BACK IN 3 DAYS**   YOU SHOULD EXPECT: Some feelings of bloating in the abdomen. Passage of more gas than usual.  Walking can help get rid of the air that was put into your GI tract during the procedure and reduce the bloating. If you had a lower endoscopy (such as a colonoscopy or flexible sigmoidoscopy) you may notice spotting of blood in your stool or on the toilet paper. If you underwent a bowel prep for your procedure, you may not have a normal bowel movement for a few days.  Please Note:  You might notice some irritation and congestion in your nose or some drainage.  This is from the oxygen used during your procedure.  There is no need for concern and it should clear up in a day or so.  SYMPTOMS TO REPORT IMMEDIATELY:   Following lower endoscopy (colonoscopy or flexible sigmoidoscopy):  Excessive amounts of blood in the stool  Significant tenderness or worsening of abdominal pains  Swelling of the abdomen that is new, acute  Fever of 100F or higher   Following upper endoscopy (EGD)  Vomiting of blood or coffee ground material  New chest pain or pain under the shoulder blades  Painful or persistently difficult swallowing  New shortness of breath  Fever of 100F or higher  Black, tarry-looking stools  For urgent or emergent issues, a gastroenterologist can be reached at any hour by calling  [phone].   DIET:  We do recommend a small meal at first, but then you may proceed to your regular diet.  Drink plenty of fluids but you should avoid alcoholic beverages for 24 hours.  ACTIVITY:  You should plan to take it easy for the rest of today and you should NOT DRIVE or use heavy machinery until tomorrow (because of the sedation medicines used during the test).    FOLLOW UP: Our staff will call the number listed on your records 48-72 hours following your procedure to check on you and address any questions or concerns that you may have regarding the information given to you following your procedure. If we do not reach you, we will leave a message.  We will attempt to reach you two times.  During this call, we will ask if you have developed any symptoms of COVID 19. If you develop any symptoms (ie: fever, flu-like symptoms, shortness of breath, cough etc.) before then, please call [phone].  If you test positive for Covid 19 in the 2 weeks post procedure, please call and report this information to [place].    If any biopsies were taken you will be contacted by phone or by letter within the next 1-3 weeks.  Please call us at [phone] if you have not heard about the biopsies in 3 weeks.    SIGNATURES/CONFIDENTIALITY: You and/or your care partner have signed paperwork which will be entered into your electronic medical record.  These  signatures attest to the fact that that the information above on your After Visit Summary has been reviewed and is understood.  Full responsibility of the confidentiality of this discharge information lies with you and/or your care-partner. 

## 2019-07-16 NOTE — Progress Notes (Signed)
Called to room to assist during endoscopic procedure.  Patient ID and intended procedure confirmed with present staff. Received instructions for my participation in the procedure from the performing physician.  

## 2019-07-18 ENCOUNTER — Telehealth: Payer: Self-pay | Admitting: *Deleted

## 2019-07-18 NOTE — Telephone Encounter (Signed)
  Follow up Call-  Call back number 07/16/2019 04/05/2018  Post procedure Call Back phone  # [phone]  Permission to leave phone message Yes Yes  Some recent data might be hidden     Patient questions:  Do you have a fever, pain , or abdominal swelling? No. Pain Score  0 *  Have you tolerated food without any problems? Yes.    Have you been able to return to your normal activities? Yes.    Do you have any questions about your discharge instructions: Diet   No. Medications  No. Follow up visit  No.  Do you have questions or concerns about your Care? No.  Actions: * If pain score is 4 or above: No action needed, pain <4.   1. Have you developed a fever since your procedure? no  2.   Have you had an respiratory symptoms (SOB or cough) since your procedure? no  3.   Have you tested positive for COVID 19 since your procedure no  4.   Have you had any family members/close contacts diagnosed with the COVID 19 since your procedure?  no   If yes to any of these questions please route to Joylene John, RN and Alphonsa Gin, Therapist, sports.

## 2019-07-22 ENCOUNTER — Other Ambulatory Visit: Payer: Self-pay | Admitting: Specialist

## 2019-07-24 ENCOUNTER — Encounter: Payer: Self-pay | Admitting: Gastroenterology

## 2019-07-24 ENCOUNTER — Other Ambulatory Visit: Payer: Self-pay

## 2019-07-24 DIAGNOSIS — R7989 Other specified abnormal findings of blood chemistry: Secondary | ICD-10-CM

## 2019-07-25 ENCOUNTER — Other Ambulatory Visit: Payer: Medicare Other

## 2019-08-01 ENCOUNTER — Other Ambulatory Visit: Payer: Self-pay | Admitting: Specialist

## 2019-08-01 NOTE — Telephone Encounter (Signed)
Holding for Dr. Louanne Skye per Jeneen Rinks.

## 2019-08-01 NOTE — Telephone Encounter (Signed)
Patient canceled June 25, 2019 appointment with Dr. Louanne Skye.  Save request for him.  Needs R OV scheduled with Dr. Louanne Skye.

## 2019-08-01 NOTE — Telephone Encounter (Signed)
Can you advise or hold for Dr. Louanne Skye?

## 2019-08-04 ENCOUNTER — Telehealth: Payer: Self-pay | Admitting: Adult Health

## 2019-08-04 NOTE — Telephone Encounter (Signed)
Patient left message on office voicemail requesting call back to make Lab appt--attempted to contact pt but cell phone voicemail states she is unavailable --Left msg that we returned her call. --FYI to [facility] in case she calls back.  --glh

## 2019-08-06 ENCOUNTER — Ambulatory Visit: Payer: Medicare Other | Admitting: Surgery

## 2019-08-07 ENCOUNTER — Other Ambulatory Visit: Payer: Self-pay

## 2019-08-07 ENCOUNTER — Telehealth (INDEPENDENT_AMBULATORY_CARE_PROVIDER_SITE_OTHER): Payer: Medicare Other | Admitting: Gastroenterology

## 2019-08-07 VITALS — Ht 60.0 in | Wt 157.0 lb

## 2019-08-07 DIAGNOSIS — K222 Esophageal obstruction: Secondary | ICD-10-CM

## 2019-08-07 DIAGNOSIS — K5909 Other constipation: Secondary | ICD-10-CM

## 2019-08-07 DIAGNOSIS — R110 Nausea: Secondary | ICD-10-CM

## 2019-08-07 DIAGNOSIS — K7469 Other cirrhosis of liver: Secondary | ICD-10-CM

## 2019-08-07 DIAGNOSIS — R1310 Dysphagia, unspecified: Secondary | ICD-10-CM

## 2019-08-07 DIAGNOSIS — R1011 Right upper quadrant pain: Secondary | ICD-10-CM

## 2019-08-07 DIAGNOSIS — K219 Gastro-esophageal reflux disease without esophagitis: Secondary | ICD-10-CM

## 2019-08-07 MED ORDER — PROMETHAZINE HCL 25 MG PO TABS: 25.0000 mg | ORAL_TABLET | Freq: Three times a day (TID) | ORAL | 0 refills | Status: DC | PRN

## 2019-08-07 MED ORDER — PANTOPRAZOLE SODIUM 40 MG PO TBEC: 40.0000 mg | DELAYED_RELEASE_TABLET | Freq: Every day | ORAL | 6 refills | Status: DC

## 2019-08-07 NOTE — Patient Instructions (Addendum)
If you are age 71 or older, your body mass index should be between 23-30. Your Body mass index is 30.66 kg/m. If this is out of the aforementioned range listed, please consider follow up with your Primary Care Provider.  If you are age 33 or younger, your body mass index should be between 19-25. Your Body mass index is 30.66 kg/m. If this is out of the aformentioned range listed, please consider follow up with your Primary Care Provider.    We have sent the following medications to your pharmacy for you to pick up at your convenience: Phenergan Protonix    Please go to the lab at [facility] ([place] You will need to go to level "B", you do not need an appointment for this. Hours available are 7:30 am - 4:30 pm.    You have been scheduled for an abdominal ultrasound at [facility] (1st floor) on 08/15/19 at 9am. Please arrive 15 minutes prior to your appointment for registration. Make certain not to have anything to eat or drink 6 hours prior to your appointment. Should you need to reschedule your appointment, please contact radiology at [phone]. This test typically takes about 30 minutes to perform.   Thank you,  Dr. Jackquline Denmark

## 2019-08-07 NOTE — Addendum Note (Signed)
Addended by: Karena Addison on: 08/07/2019 02:09 PM   Modules accepted: Orders

## 2019-08-07 NOTE — Progress Notes (Signed)
IMPRESSION and PLAN:    #1. Nausea with RUQ pain. Failed zofran.  #2. GERD with dysphagia (resolved after dil 06/2019) d/t distal eso stricture, 2 cm HH. Neg Bx for EoE. H/O food impaction 2003 s/p endoscopic disimpaction. Has component of oropharyngeal dysphagia d/t ?  Parkinson's.  Had PEJ 2012 (removed 2017). Has postprandial epigastric discomfort and nausea.  #2. Colorectal cancer screening: Colonoscopy 06/2019: 6 mm TA s/p polypectomy, mild sigmoid diverticulosis, highly redundant colon.  Next colon due 06/2026.  #3. Cryptogenic early liver cirrhosis (Dx on [place] 02/2018, alb 4.6 2019) with borderline splenomegaly with borderline platelet count. Likely d/t NASH. No ETOH, neg Heb C. No varices on EGD 06/2019 or ascites on [place] 02/2018  #5. Chronic constipation. Failed miralax, colace. Better with 3 dulcolax/day.  #6. Asymptomatic cholelithiasis.   Plan: - Change Protonix 40mg  po qd #30, 6 refills - Phenergan 25 mg p.o. every 8 hours as needed for nausea, 20.  Sedation and fall precautions were discussed. - CBC, CMP, AFP, PT, lipase. - [place] abdo complete. If still with problems and the above WU is neg, proceed with CT abdo/pelvis followed by solid-phase GES. - FU in 12 weeks in person.  HPI:    Chief Complaint:   Amy Moon is a 69 y.o. female  For follow-up visit (televisit)  Underwent EGD and colonoscopy January 2021  EGD: 07/16/2019 distal esophageal stricture s/p dilatation, grade a esophagitis, small hiatal hernia. Colonoscopy January/2021 6 mm colonic polyp SP polypectomy, mild sigmoid diverticulosis.  Otherwise normal to TI.  Biopsies showed tubular adenoma.  Repeat colonoscopy in 7 years.  No further dysphagia or heartburn. Continues to have right upper quadrant abdominal pain.  Has history of cholelithiasis in the past.  Associated with chronic nausea.  No fever chills or night sweats.  No jaundice. Denies having consistent fatty food intolerance.  Does admit that fatty  foods would make abdominal pain somewhat worse.  The nausea lasts throughout the day.  Continues to have problems with constipation which is better with 3 Dulcolax per day.  Then she would have bowel movements every day.  Has generalized abdominal bloating.  MiraLAX and Colace does not work.   Has postprandial epigastric discomfort-feels like food stays in the stomach for a longer time.  She would sometimes burp up food eaten 24 hours before.  No gastric outlet obstruction on EGD.  She was scheduled to have a solid-phase gastric emptying scan but patient canceled.  No vomiting.  No fever chills or night sweats Denies having any melena or hematochezia.  No change in mental status.  Has history of easy bruisability and has been on Plavix and Ranexa.  Being followed by Dr. Raliegh Ip.   Past Medical History:  Diagnosis Date  . Angina pectoris ([place])   . Chronic kidney disease   . Drug-induced low platelet count   . Duodenal ulcer   . Granulomatous disease ([place])   . Hepatic disease   . Hypertension   . Low vitamin D level   . Neuropathy   . Osteopenia   . Parkinson's disease ([place])       . Splenomegaly a    Current Outpatient Medications  Medication Sig Dispense Refill  . acetaminophen (TYLENOL) 650 MG CR tablet Take 650 mg by mouth every 6 (six) hours as needed for pain.    . bisacodyl (DULCOLAX) 5 MG EC tablet Take 15 mg by mouth at bedtime as needed for moderate constipation.    Marland Kitchen  clopidogrel (PLAVIX) 75 MG tablet Take 1 tablet (75 mg total) by mouth daily. 90 tablet 2  . cyclobenzaprine (FLEXERIL) 10 MG tablet TAKE 1 TABLET BY MOUTH EVERY 8 HOURS AS NEEDED 30 tablet 0  . diphenhydrAMINE (BENADRYL) 25 MG tablet Take 25 mg by mouth every 6 (six) hours as needed.    . gabapentin (NEURONTIN) 100 MG capsule Take one capsule q AM and q Noon. 180 capsule 3  . gabapentin (NEURONTIN) 300 MG capsule Take 2 capsules (600 mg total) by mouth at bedtime. 180 capsule 3  . HYDROcodone-acetaminophen  (NORCO/VICODIN) 5-325 MG tablet TAKE 1 TABLET BY MOUTH EVERY 6 HOURS AS NEEDED FOR MODERATE PAIN. (Patient taking differently: Take 1 tablet by mouth every 8 (eight) hours as needed. TAKE 1 TABLET BY MOUTH EVERY 6 HOURS AS NEEDED FOR MODERATE PAIN.) 30 tablet 0  . nitroGLYCERIN (NITROSTAT) 0.4 MG SL tablet Place 0.4 mg under the tongue every 5 (five) minutes as needed for chest pain.     Marland Kitchen ondansetron (ZOFRAN ODT) 4 MG disintegrating tablet Take 1 tablet (4 mg total) by mouth every 6 (six) hours as needed for nausea or vomiting. (Patient not taking: Reported on 07/16/2019) 30 tablet 1  . pantoprazole (PROTONIX) 40 MG tablet Take 1 tablet (40 mg total) by mouth daily. (Patient not taking: Reported on 08/05/2019) 30 tablet 6  . pantoprazole (PROTONIX) 40 MG tablet Take 1 tablet (40 mg total) by mouth 2 (two) times daily. 60 tablet 6  . ranolazine (RANEXA) 500 MG 12 hr tablet Take 1 tablet (500 mg total) by mouth 2 (two) times daily. 180 tablet 2  . Vitamin D, Ergocalciferol, (DRISDOL) 1.25 MG (50000 UT) CAPS capsule Take 1 capsule (50,000 Units total) by mouth every 7 (seven) days. (Patient taking differently: Take 50,000 Units by mouth every 7 (seven) days. On Wednesday) 16 capsule 0   Current Facility-Administered Medications  Medication Dose Route Frequency Provider Last Rate Last Admin  . bupivacaine (MARCAINE) 0.25 % (with pres) injection 2 mL  2 mL Other Once Magnus Sinning, MD        Past Surgical History:  Procedure Laterality Date  . ABDOMINAL HYSTERECTOMY    . APPENDECTOMY    . BACK SURGERY     thoracic-fractured   . BACK SURGERY     lumbar - fractured  . BREAST LUMPECTOMY WITH RADIOACTIVE SEED LOCALIZATION Right 08/01/2018   Procedure: RADIOACTIVE SEED GUIDED RIGHT BREAST LUMPECTOMY;  Surgeon: Coralie Keens, MD;  Location: [facility];  Service: General;  Laterality: Right;  . ESOPHAGOGASTRODUODENOSCOPY  05/20/2008   Mild to moderate esophagitis. Otherwise normal EGD.   Marland Kitchen EYE SURGERY  Left    cataract surgery with lens implant  . GASTROSTOMY     peptic ulcer - peg tube placement    Family History  Problem Relation Age of Onset  . Cancer Mother        ovarian  . Hyperlipidemia Mother   . Alzheimer's disease Mother   . Arthritis Mother        rheumatoid  . Cancer Father        lung  . Parkinson's disease Father   . Alzheimer's disease Father   . ALS Sister   . Cancer Maternal Aunt        pancreatic, lung, liver,breast  . Breast cancer Maternal Aunt   . Cancer Maternal Uncle        pancreatic, liver, brain  . Colon cancer Maternal Uncle   . Heart attack Paternal  Aunt   . Stroke Paternal Aunt   . Stomach cancer Paternal Aunt   . Heart attack Paternal Uncle   . Stroke Paternal Grandmother   . Heart attack Paternal Grandfather   . Arthritis Sister        rheumatoid  . Huntington's disease Daughter        presumed inherited from father per patient  . Colon cancer Maternal Uncle   . Pancreatic cancer Maternal Uncle   . Liver disease Maternal Uncle   . Breast cancer Maternal Aunt   . Breast cancer Maternal Aunt   . Esophageal cancer Neg Hx   . Rectal cancer Neg Hx     Social History   Tobacco Use  . Smoking status: Former Smoker    Packs/day: 1.00    Years: 22.00    Pack years: 22.00    Types: Cigarettes    Quit date: 06/26/1986    Years since quitting: 33.1  . Smokeless tobacco: Never Used  Substance Use Topics  . Alcohol use: No  . Drug use: No    Allergies  Allergen Reactions  . Nsaids Other (See Comments)    History of bleeding ulcers  . Latex Hives and Rash  . Sinemet [Carbidopa W-Levodopa] Nausea And Vomiting  . Inderal [Propranolol] Other (See Comments)    Unknown  . Indomethacin Other (See Comments)    Unknown  . Penicillin G Other (See Comments)    Unknown  . Pneumococcal Vaccines Other (See Comments)    Caused "pneumonia"  . Scopolamine Other (See Comments)    The patch, caused her to pass out / change in mental status  .  Tape Other (See Comments)    Unknown     Review of Systems: All systems reviewed and negative except where noted in HPI.    Physical Exam:     Ht 5' (1.524 m)   Wt 157 lb (71.2 kg)   BMI 30.66 kg/m  Not examined since it was a televisit.  . CBC Latest Ref Rng & Units 06/18/2019 07/25/2018 07/01/2018  WBC 3.4 - 10.8 x10E3/uL 5.4 4.8 5.8  Hemoglobin 11.1 - 15.9 g/dL 13.9 12.5 13.3  Hematocrit 34.0 - 46.6 % 39.5 37.6 38.9  Platelets 150 - 450 x10E3/uL 166 122(L) 119.0(L)   CMP Latest Ref Rng & Units 06/18/2019 07/25/2018 07/01/2018  Glucose 65 - 99 mg/dL 98 94 84  BUN 8 - 27 mg/dL 9 6(L) 10  Creatinine 0.57 - 1.00 mg/dL 0.79 0.69 0.60  Sodium 134 - 144 mmol/L 141 140 140  Potassium 3.5 - 5.2 mmol/L 3.6 3.8 4.7  Chloride 96 - 106 mmol/L 102 107 106  CO2 20 - 29 mmol/L 24 25 28   Calcium 8.7 - 10.3 mg/dL 9.4 9.2 9.5  Total Protein 6.0 - 8.5 g/dL 7.0 - 6.8  Total Bilirubin 0.0 - 1.2 mg/dL 0.9 - 0.6  Alkaline Phos 39 - 117 IU/L 89 - 78  AST 0 - 40 IU/L 17 - 15  ALT 0 - 32 IU/L 12 - 13   I connected with  [name] on 08/07/19 by a phone visit (video-did not work due to Internet connection) and verified that I am speaking with the correct person using two identifiers.   I discussed the limitations of evaluation and management by telemedicine. The patient expressed understanding and agreed to proceed.     Kasi Lasky,MD 08/07/2019, 1:37 PM   CC Danford, Berna Spare, NP

## 2019-08-14 ENCOUNTER — Ambulatory Visit: Payer: Medicare Other | Admitting: Specialist

## 2019-08-15 ENCOUNTER — Other Ambulatory Visit: Payer: Self-pay | Admitting: Specialist

## 2019-08-15 ENCOUNTER — Ambulatory Visit (HOSPITAL_BASED_OUTPATIENT_CLINIC_OR_DEPARTMENT_OTHER): Admission: RE | Admit: 2019-08-15 | Payer: Medicare Other | Source: Ambulatory Visit

## 2019-08-18 ENCOUNTER — Ambulatory Visit: Payer: Medicare Other | Admitting: Specialist

## 2019-08-21 ENCOUNTER — Ambulatory Visit: Payer: Medicare Other | Admitting: Specialist

## 2019-08-21 ENCOUNTER — Encounter: Payer: Self-pay | Admitting: Specialist

## 2019-08-21 ENCOUNTER — Other Ambulatory Visit: Payer: Self-pay

## 2019-08-21 VITALS — BP 158/88 | HR 91 | Ht 60.0 in | Wt 160.0 lb

## 2019-08-21 DIAGNOSIS — M1711 Unilateral primary osteoarthritis, right knee: Secondary | ICD-10-CM

## 2019-08-21 DIAGNOSIS — M1712 Unilateral primary osteoarthritis, left knee: Secondary | ICD-10-CM

## 2019-08-21 DIAGNOSIS — M542 Cervicalgia: Secondary | ICD-10-CM

## 2019-08-21 DIAGNOSIS — M47812 Spondylosis without myelopathy or radiculopathy, cervical region: Secondary | ICD-10-CM

## 2019-08-21 NOTE — Progress Notes (Signed)
Office Visit Note   Patient: Amy Moon           Date of Birth: May 09, 1951           MRN: [id number] Visit Date: 06/16/2019              Requested by: [name], NP [place],   [id number] PCP: [name], NP   Assessment & Plan: Visit Diagnoses:  1. Cervical radiculopathy   2. Cervicalgia   3. Cervical spondylosis without myelopathy   4. Chronic right shoulder pain   5. Weakness of shoulder     Plan: Avoid overhead lifting and overhead use of the arms. Do not lift greater than 5 lbs. Adjust head rest in vehicle to prevent hyperextension if rear ended. Take extra precautions to avoid falling, including use of a cane if you feel weak. Avoid overhead lifting and overhead use of the arms. Do not lift greater than 10 lbs. Tylenol ES one every 6-8 hours for pain and inflamation.  MRI of the cervical spine and right shoulder ordered. If the MRI confirms the previous findings right C3-4 and C4-5 foramenal stenosis seen in 09/2017 then foramenotomies right C3-4 and C4-5 would be recommended. MRI of the shoulder to assess for pathology that is suggested by your exam to be present in the right shoulder, if a rotator cuff pathology is present then we can advise you as to prognosis for right shoulder pain relief with cervical spine surgery.  Follow-Up Instructions: Return for Basil Dess, MD.   Orders:  Orders Placed This Encounter  Procedures  . Nerve Block  . XR C-ARM NO REPORT   Meds ordered this encounter  Medications  . bupivacaine (MARCAINE) 0.25 % (with pres) injection 2 mL      Procedures: No procedures performed   Clinical Data: No additional findings.   Subjective: Chief Complaint  Patient presents with  . Right Shoulder - Pain  . Right Arm - Pain    69 year old right handed female with a long history of neck and right shoulder pain. She had right C4 and C5 SNR blocks with excellent relief of pain in the right neck and  shoulder. She returns with increasing pain in the neck and right arm with numbness and paresthesias right dorsal and radial hand and forearm. She has painful ROM of the right shoulder and right neck. No bowel or bladder discomfort. Last MRI of the neck was 09/2017. EMG/NCV of the right arm with no motor changes noted. She has plain radiographs of the right shoulder Result Date: 05/09/2019 AP, lateral and outlet views show minimal type 2 acromion process with minimal AC arthrosis, G-H joint well maintained with only minimal DJD. The SAS is 12.5 mm normal, mild cystic and sclerotic changes at the greater tuberosity area of Cuff attachment. She is weak in the use of the right arm.     Review of Systems  Constitutional: Negative.   HENT: Negative.   Eyes: Negative.   Respiratory: Negative.  Negative for apnea, cough, choking, chest tightness, shortness of breath, wheezing and stridor.   Cardiovascular: Negative.  Negative for chest pain, palpitations and leg swelling.  Gastrointestinal: Negative.  Negative for abdominal distention, abdominal pain, anal bleeding, blood in stool, constipation, diarrhea, nausea, rectal pain and vomiting.  Endocrine: Negative.  Negative for cold intolerance, heat intolerance, polydipsia and polyuria.  Genitourinary: Negative.   Musculoskeletal: Positive for arthralgias, neck pain and neck stiffness. Negative for  back pain, gait problem, joint swelling and myalgias.  Skin: Negative.   Allergic/Immunologic: Negative.  Negative for environmental allergies, food allergies and immunocompromised state.  Neurological: Negative.  Negative for dizziness, tremors, seizures, syncope, facial asymmetry, speech difficulty, weakness, light-headedness, numbness and headaches.  Hematological: Negative.   Psychiatric/Behavioral: Negative.      Objective: Vital Signs: BP (!) 142/80   Pulse (!) 113   Physical Exam Constitutional:      Appearance: She is well-developed.  HENT:      Head: Normocephalic and atraumatic.  Eyes:     Pupils: Pupils are equal, round, and reactive to light.  Pulmonary:     Effort: Pulmonary effort is normal.     Breath sounds: Normal breath sounds.  Abdominal:     General: Bowel sounds are normal.     Palpations: Abdomen is soft.  Musculoskeletal:     Cervical back: Normal range of motion and neck supple.     Lumbar back: Negative right straight leg raise test and negative left straight leg raise test.  Skin:    General: Skin is warm and dry.  Neurological:     Mental Status: She is alert and oriented to person, place, and time.  Psychiatric:        Behavior: Behavior normal.        Thought Content: Thought content normal.        Judgment: Judgment normal.     Back Exam   Tenderness  The patient is experiencing tenderness in the cervical.  Range of Motion  Extension: abnormal  Flexion: abnormal  Lateral bend right: abnormal  Lateral bend left: abnormal  Rotation right: abnormal  Rotation left: abnormal   Muscle Strength  Right Quadriceps:  5/5  Left Quadriceps:  5/5  Right Hamstrings:  5/5  Left Hamstrings:  5/5   Tests  Straight leg raise right: negative Straight leg raise left: negative  Reflexes  Biceps: 1/4 Babinski's sign: normal   Other  Toe walk: normal Heel walk: normal Sensation: decreased Erythema: no back redness Scars: absent      Specialty Comments:  No specialty comments available.  Imaging: No results found.   PMFS History: Patient Active Problem List   Diagnosis Date Noted  . Bronchitis 06/03/2018  . Encounter for Medicare annual wellness exam 05/09/2018  . Vitamin D deficiency 11/05/2017  . Encounter for hepatitis C virus screening test for high risk patient 11/05/2017  . Dysphagia 09/03/2017  . Thrombocytopenia ([place]) 09/03/2017  . Healthcare maintenance 07/30/2017  . Chronic pain syndrome 07/30/2017  . Chest pain 07/30/2017  . Parkinson's disease ([place]) 07/30/2017  .  Hypertension 07/30/2017  . Severe obesity (BMI >= 40) ([place]) 05/15/2016  . Severe episode of recurrent major depressive disorder, without psychotic features ([place]) 05/15/2016  . Tremor of both hands 05/15/2016  . Irritant contact dermatitis 08/25/2015   Past Medical History:  Diagnosis Date  . Allergy   . Anemia   . Angina pectoris ([place])   . Arthritis   . Asthma    years ago  . Cancer (HCC)    squamous cell carcinoma, nose  . Cataract   . Chronic kidney disease    possibly per pt  . Cirrhosis ([place])   . Clotting disorder (H[facility])    platelets are low  . Depression   . Drug-induced low platelet count   . Duodenal ulcer   . Enlarged thyroid   . Family history of adverse reaction to anesthesia    father had difficulty  waking up and breathing on his own after surgery  . GERD (gastroesophageal reflux disease)   . Granulomatous disease ([place])   . Hepatic disease   . Hiatal hernia   . History of kidney stones   . Hypertension   . Low vitamin D level   . Neuropathy   . Osteopenia   . Osteoporosis   . Parkinson's disease ([place])   . Pneumonia   . Portal hypertension ([place])   . Splenomegaly a    Family History  Problem Relation Age of Onset  . Cancer Mother        ovarian  . Hyperlipidemia Mother   . Alzheimer's disease Mother   . Arthritis Mother        rheumatoid  . Cancer Father        lung  . Parkinson's disease Father   . Alzheimer's disease Father   . ALS Sister   . Cancer Maternal Aunt        pancreatic, lung, liver,breast  . Breast cancer Maternal Aunt   . Cancer Maternal Uncle        pancreatic, liver, brain  . Colon cancer Maternal Uncle   . Heart attack Paternal Aunt   . Stroke Paternal Aunt   . Stomach cancer Paternal Aunt   . Heart attack Paternal Uncle   . Stroke Paternal Grandmother   . Heart attack Paternal Grandfather   . Arthritis Sister        rheumatoid  . Huntington's disease Daughter        presumed inherited from father per patient  . Colon  cancer Maternal Uncle   . Pancreatic cancer Maternal Uncle   . Liver disease Maternal Uncle   . Breast cancer Maternal Aunt   . Breast cancer Maternal Aunt   . Esophageal cancer Neg Hx   . Rectal cancer Neg Hx     Past Surgical History:  Procedure Laterality Date  . ABDOMINAL HYSTERECTOMY    . APPENDECTOMY    . BACK SURGERY     thoracic-fractured   . BACK SURGERY     lumbar - fractured  . BREAST LUMPECTOMY WITH RADIOACTIVE SEED LOCALIZATION Right 08/01/2018   Procedure: RADIOACTIVE SEED GUIDED RIGHT BREAST LUMPECTOMY;  Surgeon: Coralie Keens, MD;  Location: [facility];  Service: General;  Laterality: Right;  . ESOPHAGOGASTRODUODENOSCOPY  05/20/2008   Mild to moderate esophagitis. Otherwise normal EGD.   Marland Kitchen EYE SURGERY Left    cataract surgery with lens implant  . GASTROSTOMY     peptic ulcer - peg tube placement   Social History   Occupational History  . Not on file  Tobacco Use  . Smoking status: Former Smoker    Packs/day: 1.00    Years: 22.00    Pack years: 22.00    Types: Cigarettes    Quit date: 06/26/1986    Years since quitting: 33.1  . Smokeless tobacco: Never Used  Substance and Sexual Activity  . Alcohol use: No  . Drug use: No  . Sexual activity: Not Currently    Birth control/protection: None

## 2019-08-21 NOTE — Patient Instructions (Addendum)
Avoid overhead lifting and overhead use of the arms. Do not lift greater than 5 lbs. Adjust head rest in vehicle to prevent hyperextension if rear ended. Take extra precautions to avoid falling, including use of a cane if you feel weak. Avoid overhead lifting and overhead use of the arms. Do not lift greater than 10 lbs. Tylenol ES one every 6-8 hours for pain and inflamation.  MRI of the cervical spine and right shoulder ordered. If the MRI confirms the previous findings right C3-4 and C4-5 foramenal stenosis seen in 09/2017 then foramenotomies right C3-4 and C4-5 would be recommended. MRI of the shoulder to assess for pathology that is suggested by your exam to be present in the right shoulder, if a rotator cuff pathology is present then we can advise you as to prognosis for right shoulder pain relief with cervical spine surgery.

## 2019-08-22 ENCOUNTER — Other Ambulatory Visit: Payer: Self-pay | Admitting: Gastroenterology

## 2019-08-25 ENCOUNTER — Telehealth: Payer: Self-pay | Admitting: Gastroenterology

## 2019-08-25 ENCOUNTER — Telehealth: Payer: Self-pay | Admitting: Specialist

## 2019-08-25 NOTE — Telephone Encounter (Signed)
Patient called and stated that she is to have an MRI and no one has called to schedule anything with her.  Please call patient to advise.  [phone]

## 2019-08-26 NOTE — Telephone Encounter (Signed)
I have called patient and left a message for her to return my call.   

## 2019-08-26 NOTE — Telephone Encounter (Signed)
I called and advised that we are working on getting the approval from her insurance.  I adivsed that hopefully we will hear something soon on this and we can get her scheduled

## 2019-08-28 NOTE — Telephone Encounter (Signed)
I have called patient and left message for her t return my call.

## 2019-08-29 ENCOUNTER — Ambulatory Visit: Payer: Medicare Other | Admitting: Specialist

## 2019-08-29 ENCOUNTER — Other Ambulatory Visit: Payer: Self-pay | Admitting: Specialist

## 2019-08-30 ENCOUNTER — Ambulatory Visit (HOSPITAL_BASED_OUTPATIENT_CLINIC_OR_DEPARTMENT_OTHER)
Admission: RE | Admit: 2019-08-30 | Discharge: 2019-08-30 | Disposition: A | Discharge status: Home / Self Care | Payer: Medicare Other | Source: Ambulatory Visit | Attending: Specialist | Admitting: Specialist

## 2019-08-30 ENCOUNTER — Encounter (HOSPITAL_BASED_OUTPATIENT_CLINIC_OR_DEPARTMENT_OTHER): Payer: Self-pay

## 2019-08-30 ENCOUNTER — Other Ambulatory Visit: Payer: Self-pay

## 2019-08-30 DIAGNOSIS — M542 Cervicalgia: Secondary | ICD-10-CM

## 2019-08-30 DIAGNOSIS — G8929 Other chronic pain: Secondary | ICD-10-CM

## 2019-08-30 DIAGNOSIS — M25511 Pain in right shoulder: Secondary | ICD-10-CM | POA: Insufficient documentation

## 2019-08-30 DIAGNOSIS — M5412 Radiculopathy, cervical region: Secondary | ICD-10-CM | POA: Diagnosis not present

## 2019-08-30 DIAGNOSIS — R29898 Other symptoms and signs involving the musculoskeletal system: Secondary | ICD-10-CM

## 2019-09-01 NOTE — Telephone Encounter (Signed)
I have called patient and left a message for her to return my call.   

## 2019-09-03 ENCOUNTER — Telehealth: Payer: Self-pay | Admitting: Gastroenterology

## 2019-09-03 DIAGNOSIS — R110 Nausea: Secondary | ICD-10-CM

## 2019-09-03 MED ORDER — PROMETHAZINE HCL 25 MG PO TABS: 25.0000 mg | ORAL_TABLET | Freq: Four times a day (QID) | ORAL | 2 refills | Status: DC | PRN

## 2019-09-03 NOTE — Telephone Encounter (Signed)
Called and spoke with patient- patient reports she is using the Phenergan to "deal with the nausea", however, she is needing a refill "for enough to get me at least to my appointment with Dr. Lyndel Safe on 03/16"; Please advise if this refill is appropriate

## 2019-09-03 NOTE — Telephone Encounter (Signed)
Called and spoke with patient-patient informed of MD recommendations; patient is agreeable with plan of care and verified pharmacy; RX sent; Patient verbalized understanding of information/instructions;  Patient was advised to call the office at [phone] if questions/concerns arise;

## 2019-09-03 NOTE — Telephone Encounter (Signed)
Please call in Phenergan 25 mg p.o. every 6-8 hours as needed, 30, 2 refills. Sedation and fall precautions  RG

## 2019-09-04 ENCOUNTER — Other Ambulatory Visit: Payer: Self-pay

## 2019-09-04 ENCOUNTER — Encounter: Payer: Self-pay | Admitting: Specialist

## 2019-09-04 ENCOUNTER — Ambulatory Visit: Payer: Medicare Other | Admitting: Specialist

## 2019-09-04 VITALS — BP 127/81 | HR 94 | Ht 60.0 in | Wt 160.0 lb

## 2019-09-04 DIAGNOSIS — M4722 Other spondylosis with radiculopathy, cervical region: Secondary | ICD-10-CM

## 2019-09-04 DIAGNOSIS — M25511 Pain in right shoulder: Secondary | ICD-10-CM

## 2019-09-04 DIAGNOSIS — M19011 Primary osteoarthritis, right shoulder: Secondary | ICD-10-CM

## 2019-09-04 DIAGNOSIS — M4802 Spinal stenosis, cervical region: Secondary | ICD-10-CM

## 2019-09-04 DIAGNOSIS — M7551 Bursitis of right shoulder: Secondary | ICD-10-CM

## 2019-09-04 DIAGNOSIS — M7581 Other shoulder lesions, right shoulder: Secondary | ICD-10-CM

## 2019-09-04 DIAGNOSIS — M5412 Radiculopathy, cervical region: Secondary | ICD-10-CM

## 2019-09-04 MED ORDER — BUPIVACAINE HCL 0.25 % IJ SOLN
4.0000 mL | INTRAMUSCULAR | Status: AC | PRN
  Administered 2019-09-04: 4 mL via INTRA_ARTICULAR

## 2019-09-04 MED ORDER — METHYLPREDNISOLONE ACETATE 40 MG/ML IJ SUSP
40.0000 mg | INTRAMUSCULAR | Status: AC | PRN
  Administered 2019-09-04: 40 mg via INTRA_ARTICULAR

## 2019-09-04 NOTE — Progress Notes (Signed)
Office Visit Note   Patient: Amy Moon           Date of Birth: 03-26-1951           MRN: [id number] Visit Date: 09/04/2019              Requested by: [name], NP [place],  [place] [address] PCP: Patient, No Pcp Per   Assessment & Plan: Visit Diagnoses:  1. Rotator cuff tendonitis, right   2. Right shoulder pain, unspecified chronicity   3. Other spondylosis with radiculopathy, cervical region   4. Spinal stenosis of cervical region   5. Cervical radiculopathy at C5   6. Bursitis of right shoulder     Plan: Avoid overhead lifting and overhead use of the arms. Do not lift greater than 5 lbs. Adjust head rest in vehicle to prevent hyperextension if rear ended. Take extra precautions to avoid falling, including use of a cane if you feel weak. Scheduling secretary Kandice Hams. will call you to arrange for surgery for your cervical spine. If you wish a second opinion please let us know and we can arrange for you. If you have worsening arm or leg numbness or weakness please call or go to an ER. We will contact your cardiologist and primary care physicians to seek clearance for your surgery. Surgery will be an anterior cervical discectomy and fusion at the C3-4 and C4-5 levels with decompression of the cervical spinal canal at C3-4 and C4-5 and removal of bone pressing on the spinal cord and right C4 and C5 nerve roots. An additional decompression fusion is done with bone grafting and plate and screws, Local bone graft as well and allograft bone graft and vivigen.  Risks of surgery include risks of infection, bleeding and risks to the spinal cord and  Risks of sore throat and difficulty swallowing which should  Improve over the next 4-6 weeks following surgery. Surgery is indicated due to upper extremity radiculopathy, lermittes phenomena and falls. In the future surgery at adjacent levels may be necessary but these levels do not appear to be related to  your current symptoms or signs.  Avoid overhead lifting and overhead use of the arms. Pillows to keep from sleeping directly on the shoulders Limited lifting to less than 10 lbs. Ice or heat for relief. NSAIDs are helpful, such as alleve or motrin, be careful not to use in excess as they place burdens on the kidney. Stretching exercise help and strengthening is helpful to build endurance.     Follow-Up Instructions: Return in about 4 weeks (around 10/02/2019).   Orders:  Orders Placed This Encounter  Procedures  . Large Joint Inj: R subacromial bursa   No orders of the defined types were placed in this encounter.     Procedures: Large Joint Inj: R subacromial bursa on 09/04/2019 11:24 AM Indications: pain Details: 25 G 1.5 in needle, anterolateral approach  Arthrogram: No  Medications: 40 mg methylPREDNISolone acetate 40 MG/ML; 4 mL bupivacaine 0.25 % Outcome: tolerated well, no immediate complications  Bandaid applied Procedure, treatment alternatives, risks and benefits explained, specific risks discussed. Consent was given by the patient. Immediately prior to procedure a time out was called to verify the correct patient, procedure, equipment, support staff and site/side marked as required. Patient was prepped and draped in the usual sterile fashion.       Clinical Data: Findings:  CLINICAL DATA:  Chronic neck pain. Right shoulder and arm pain with  numbness and paresthesia.  EXAM: MRI CERVICAL SPINE WITHOUT CONTRAST  TECHNIQUE: Multiplanar, multisequence MR imaging of the cervical spine was performed. No intravenous contrast was administered.  COMPARISON:  10/23/2017  FINDINGS: Alignment: 2 mm of anterolisthesis at C3-4  Vertebrae: No fracture, evidence of discitis, or bone lesion.  Cord: Normal signal and morphology.  Posterior Fossa, vertebral arteries, paraspinal tissues: Negative.  Disc levels:  C2-3: Asymmetric left facet spurring.  Ligamentum flavum thickening. The canal and foramina remain patent  C3-4: Facet degeneration with spurring asymmetric to the right. Disc narrowing with disc bulging and endplate/uncovertebral ridging. There is bilateral foraminal impingement and mild spinal stenosis with ventral cord flattening.  C4-5: Disc narrowing endplate and uncovertebral ridging. Minor facet spurring. Moderate left foraminal narrowing.  C5-6: Mild facet spurring. Mild disc narrowing. Left uncovertebral spurring causes mild foraminal narrowing  C6-7: Leftward disc bulging and uncovertebral ridging with moderate foraminal narrowing. Patent spinal canal  C7-T1:Unremarkable.  IMPRESSION: 1. Resolved C3-4 facet arthritis since 2019, but there is new mild anterolisthesis at this level. 2. Stable spinal stenosis at C3-4 and C4-5 with mild ventral cord flattening at C3-4. 3. Foraminal impingement bilaterally at C3-4, on the left at C4-5, and on the left at C6-7.   Electronically Signed   By: Monte Fantasia M.D.   On: 09/01/2019 05:51 My review of the MRI shows that there is a large posterior spur right C3-4 and this likely causes right C4 and C5 nerve compression, this is within the spinal canal and is associated with a spondylolisthesis C3-4, right foramenal stenosis also present right C4-5 with severe DDD and mild cervical stenosis.     Subjective: Chief Complaint  Patient presents with  . Neck - Follow-up    MRI Review  . Right Shoulder - Follow-up    MRI Review    69 year old right handed female with history of neck pain and radiation into the right shoulder and down the right arm into the right  Hand. I radiates into all of the right hand. There is right shoulder pain with sleeping on the right side when she is dozing and rolls onto the Right side. No bowel or bladder difficulty. Has occasionally the left knee giving due to osteoarthritis pain. She experiences numbness and Tingling into the  right arm, like you wake up from a sleep after sleeping on the right arm. There is weakness in the right arm.    Review of Systems  Constitutional: Negative.   HENT: Positive for tinnitus. Negative for congestion, dental problem, drooling, ear discharge, ear pain, facial swelling, hearing loss, mouth sores, nosebleeds, postnasal drip, rhinorrhea, sinus pressure, sinus pain, sneezing, sore throat, trouble swallowing and voice change.   Eyes: Negative for photophobia, pain, discharge, redness, itching and visual disturbance.  Respiratory: Negative.  Negative for apnea, cough, choking, chest tightness, shortness of breath, wheezing and stridor.   Cardiovascular: Negative.  Negative for chest pain, palpitations and leg swelling.  Gastrointestinal: Positive for abdominal pain and nausea. Negative for abdominal distention, anal bleeding, blood in stool, constipation, diarrhea, rectal pain and vomiting.  Endocrine: Negative.  Negative for cold intolerance, heat intolerance, polydipsia, polyphagia and polyuria.  Genitourinary: Negative.  Negative for difficulty urinating, dyspareunia, dysuria, enuresis, flank pain, frequency and urgency.  Musculoskeletal: Positive for neck pain and neck stiffness. Negative for arthralgias, back pain, gait problem, joint swelling and myalgias.  Skin: Negative.  Negative for color change, pallor, rash and wound.  Allergic/Immunologic: Negative.  Negative for environmental allergies, food allergies  and immunocompromised state.  Neurological: Positive for weakness and numbness. Negative for dizziness, tremors, seizures, syncope, facial asymmetry, speech difficulty, light-headedness and headaches.  Hematological: Negative.   Psychiatric/Behavioral: Negative.      Objective: Vital Signs: BP 127/81 (BP Location: Left Arm, Patient Position: Sitting)   Pulse 94   Ht 5' (1.524 m)   Wt 160 lb (72.6 kg)   BMI 31.25 kg/m   Physical Exam Constitutional:      Appearance: She  is well-developed.  HENT:     Head: Normocephalic and atraumatic.  Eyes:     Pupils: Pupils are equal, round, and reactive to light.  Pulmonary:     Effort: Pulmonary effort is normal.     Breath sounds: Normal breath sounds.  Abdominal:     General: Bowel sounds are normal.     Palpations: Abdomen is soft.  Musculoskeletal:     Cervical back: Normal range of motion and neck supple.     Lumbar back: Negative right straight leg raise test and negative left straight leg raise test.  Skin:    General: Skin is warm and dry.  Neurological:     Mental Status: She is alert and oriented to person, place, and time.  Psychiatric:        Behavior: Behavior normal.        Thought Content: Thought content normal.        Judgment: Judgment normal.     Back Exam   Range of Motion  Extension:  50 abnormal  Flexion:  60 abnormal  Lateral bend right: abnormal  Lateral bend left: abnormal  Rotation right: abnormal  Rotation left: abnormal   Muscle Strength  Right Quadriceps:  5/5  Left Quadriceps:  5/5  Right Hamstrings:  5/5  Left Hamstrings:  5/5   Tests  Straight leg raise right: negative Straight leg raise left: negative  Reflexes  Patellar: 1/4 Achilles: 1/4 Biceps: 1/4 Babinski's sign: normal   Other  Toe walk: normal Heel walk: normal Sensation: normal Gait: normal   Comments:  Weak right C5 distribution, shoulder abduction and ER,  Numbness right arm relieved by C4 and C5 SNRB   Right Shoulder Exam   Tenderness  The patient is experiencing tenderness in the acromioclavicular joint and acromion.  Range of Motion  Active abduction:  130 abnormal  Passive abduction:  140 abnormal  Extension: 40  External rotation: 90  Forward flexion: 160  Internal rotation 0 degrees: Lumbar  Internal rotation 90 degrees: 90   Muscle Strength  Abduction: 4/5  Internal rotation: 5/5  External rotation: 4/5  Supraspinatus: 4/5  Subscapularis: 5/5  Biceps: 5/5   Tests   Apprehension: positive Cross arm: positive Impingement: positive  Other  Erythema: absent Scars: absent Sensation: decreased Pulse: present   Left Shoulder Exam  Left shoulder exam is normal.      Specialty Comments:  No specialty comments available.  Imaging: No results found.   PMFS History: Patient Active Problem List   Diagnosis Date Noted  . Bronchitis 06/03/2018  . Encounter for Medicare annual wellness exam 05/09/2018  . Vitamin D deficiency 11/05/2017  . Encounter for hepatitis C virus screening test for high risk patient 11/05/2017  . Dysphagia 09/03/2017  . Thrombocytopenia ([place]) 09/03/2017  . Healthcare maintenance 07/30/2017  . Chronic pain syndrome 07/30/2017  . Chest pain 07/30/2017  . Parkinson's disease ([facility]) 07/30/2017  . Hypertension 07/30/2017  . Severe obesity (BMI >= 40) ([place]) 05/15/2016  . Severe episode of recurrent  major depressive disorder, without psychotic features ([place]) 05/15/2016  . Tremor of both hands 05/15/2016  . Irritant contact dermatitis 08/25/2015   Past Medical History:  Diagnosis Date  . Allergy   . Anemia   . Angina pectoris ([facility])   . Arthritis   . Asthma    years ago  . Cancer (HCC)    squamous cell carcinoma, nose  . Cataract   . Chronic kidney disease    possibly per pt  . Cirrhosis ([place])   . Clotting disorder (HCC)    platelets are low  . Depression   . Drug-induced low platelet count   . Duodenal ulcer   . Enlarged thyroid   . Family history of adverse reaction to anesthesia    father had difficulty waking up and breathing on his own after surgery  . GERD (gastroesophageal reflux disease)   . Granulomatous disease ([facility])   . Hepatic disease   . Hiatal hernia   . History of kidney stones   . Hypertension   . Low vitamin D level   . Neuropathy   . Osteopenia   . Osteoporosis   . Parkinson's disease ([place])   . Pneumonia   . Portal hypertension ([place])   . Splenomegaly a    Family History  Problem  Relation Age of Onset  . Cancer Mother        ovarian  . Hyperlipidemia Mother   . Alzheimer's disease Mother   . Arthritis Mother        rheumatoid  . Cancer Father        lung  . Parkinson's disease Father   . Alzheimer's disease Father   . ALS Sister   . Cancer Maternal Aunt        pancreatic, lung, liver,breast  . Breast cancer Maternal Aunt   . Cancer Maternal Uncle        pancreatic, liver, brain  . Colon cancer Maternal Uncle   . Heart attack Paternal Aunt   . Stroke Paternal Aunt   . Stomach cancer Paternal Aunt   . Heart attack Paternal Uncle   . Stroke Paternal Grandmother   . Heart attack Paternal Grandfather   . Arthritis Sister        rheumatoid  . Huntington's disease Daughter        presumed inherited from father per patient  . Colon cancer Maternal Uncle   . Pancreatic cancer Maternal Uncle   . Liver disease Maternal Uncle   . Breast cancer Maternal Aunt   . Breast cancer Maternal Aunt   . Esophageal cancer Neg Hx   . Rectal cancer Neg Hx     Past Surgical History:  Procedure Laterality Date  . ABDOMINAL HYSTERECTOMY    . APPENDECTOMY    . BACK SURGERY     thoracic-fractured   . BACK SURGERY     lumbar - fractured  . BREAST LUMPECTOMY WITH RADIOACTIVE SEED LOCALIZATION Right 08/01/2018   Procedure: RADIOACTIVE SEED GUIDED RIGHT BREAST LUMPECTOMY;  Surgeon: Coralie Keens, MD;  Location: [facility];  Service: General;  Laterality: Right;  . ESOPHAGOGASTRODUODENOSCOPY  05/20/2008   Mild to moderate esophagitis. Otherwise normal EGD.   [facility]land Kitchen EYE SURGERY Left    cataract surgery with lens implant  . GASTROSTOMY     peptic ulcer - peg tube placement   Social History   Occupational History  . Not on file  Tobacco Use  . Smoking status: Former Smoker    Packs/day: 1.00  Years: 22.00    Pack years: 22.00    Types: Cigarettes    Quit date: 06/26/1986    Years since quitting: 33.2  . Smokeless tobacco: Never Used  Substance and Sexual Activity  .  Alcohol use: No  . Drug use: No  . Sexual activity: Not Currently    Birth control/protection: None

## 2019-09-04 NOTE — Patient Instructions (Addendum)
Plan: Avoid overhead lifting and overhead use of the arms. Do not lift greater than 5 lbs. Adjust head rest in vehicle to prevent hyperextension if rear ended. Take extra precautions to avoid falling, including use of a cane if you feel weak. Scheduling secretary Kandice Hams. will call you to arrange for surgery for your cervical spine. If you wish a second opinion please let us know and we can arrange for you. If you have worsening arm or leg numbness or weakness please call or go to an ER. We will contact your cardiologist and primary care physicians to seek clearance for your surgery. Surgery will be an anterior cervical discectomy and fusion at the C3-4 and C4-5 levels with decompression of the cervical spinal canal at C3-4 and C4-5 and removal of bone pressing on the spinal cord and right C4 and C5 nerve roots. An additional decompression fusion is done with bone grafting and plate and screws, Local bone graft as well and allograft bone graft and vivigen.  Risks of surgery include risks of infection, bleeding and risks to the spinal cord and  Risks of sore throat and difficulty swallowing which should  Improve over the next 4-6 weeks following surgery. Surgery is indicated due to upper extremity radiculopathy, lermittes phenomena and falls. In the future surgery at adjacent levels may be necessary but these levels do not appear to be related to your current symptoms or signs. Avoid overhead lifting and overhead use of the arms. Pillows to keep from sleeping directly on the shoulders Limited lifting to less than 10 lbs. Ice or heat for relief. NSAIDs are helpful, such as alleve or motrin, be careful not to use in excess as they place burdens on the kidney. Stretching exercise help and strengthening is helpful to build endurance.

## 2019-09-09 ENCOUNTER — Telehealth (INDEPENDENT_AMBULATORY_CARE_PROVIDER_SITE_OTHER): Payer: Medicare Other | Admitting: Gastroenterology

## 2019-09-09 ENCOUNTER — Encounter: Payer: Self-pay | Admitting: Gastroenterology

## 2019-09-09 ENCOUNTER — Other Ambulatory Visit: Payer: Self-pay

## 2019-09-09 VITALS — Ht 60.0 in | Wt 160.0 lb

## 2019-09-09 DIAGNOSIS — R1011 Right upper quadrant pain: Secondary | ICD-10-CM | POA: Diagnosis not present

## 2019-09-09 DIAGNOSIS — K7469 Other cirrhosis of liver: Secondary | ICD-10-CM

## 2019-09-09 DIAGNOSIS — R110 Nausea: Secondary | ICD-10-CM | POA: Diagnosis not present

## 2019-09-09 MED ORDER — PROMETHAZINE HCL 25 MG PO TABS: 25.0000 mg | ORAL_TABLET | Freq: Three times a day (TID) | ORAL | 0 refills | Status: DC | PRN

## 2019-09-09 MED ORDER — PANTOPRAZOLE SODIUM 40 MG PO TBEC: 40.0000 mg | DELAYED_RELEASE_TABLET | Freq: Every day | ORAL | 6 refills | Status: DC

## 2019-09-09 NOTE — Progress Notes (Signed)
IMPRESSION and PLAN:    #1. Nausea with RUQ pain. Failed zofran. Better with phenergan  #2. GERD with dysphagia (resolved after dil 06/2019) d/t distal eso stricture, 2 cm HH. Neg Bx for EoE. H/O food impaction 2003 s/p endoscopic disimpaction. Has component of oropharyngeal dysphagia d/t ?  Parkinson's.  Had PEJ 2012 (removed 2017).   #2. Colorectal cancer screening: Colonoscopy 06/2019: 6 mm TA s/p polypectomy, mild sig div, highly redundant colon.  Next colon due 06/2026.  #3. Cryptogenic early liver cirrhosis (Dx on [place] 02/2018, alb 4.6 2019) with borderline splenomegaly with borderline platelet count. Likely d/t NASH. No ETOH, neg Hep C. No varices on EGD 06/2019 or ascites on [place] 02/2018  #5. Chronic constipation. Failed miralax, colace. Better with 3 dulcolax/day.  #6. Asymptomatic cholelithiasis.   Plan: -Continue Protonix 40mg  po qd #30, 6 refills -Phenergan 25 mg p.o. Q8hrs PRN nausea, #20.  Sedation and fall precautions were discussed. -CBC, CMP, AFP, PT, lipase. -[place] abdo complete. If still with problems and the above WU is neg, proceed with CT abdo/pelvis followed by solid-phase GES. -FU in 12 weeks in person. -Discussed compliance.  HPI:    Chief Complaint:   Amy Moon is a 69 y.o. female  For follow-up visit (televisit) Having neck problems, Dx with Cx radiculopathy, may require neck Sx She will scheduled Covid 19 vaccive thru my chart.  Nausea much better with phenergan  Underwent EGD and colonoscopy January 2021  EGD: 07/16/2019 distal esophageal stricture s/p dilatation, grade a esophagitis, small hiatal hernia. Colonoscopy January/2021 6 mm colonic polyp SP polypectomy, mild sigmoid diverticulosis.  Otherwise normal to TI.  Biopsies showed tubular adenoma.  Repeat colonoscopy in 7 years.  No further dysphagia or heartburn. Continues to have RUQ abdominal pain.  Has history of cholelithiasis in the past.  Associated with chronic nausea.  No fever chills  or night sweats.  No jaundice.  She did not go for ultrasound scheduled during last visit.   Denies having consistent fatty food intolerance.  Does admit that fatty foods would make abdominal pain somewhat worse.  The nausea lasts throughout the day.  Continues to have problems with constipation which is better with 3 Dulcolax per day.  Then she would have bowel movements every day.  Has generalized abdominal bloating.  MiraLAX and Colace does not work.  Has postprandial epigastric discomfort-feels like food stays in the stomach for a longer time.  She would sometimes burp up food eaten 24 hours before.  No gastric outlet obstruction on EGD.  She was scheduled to have a solid-phase gastric emptying scan but patient canceled.  No vomiting.  No fever chills or night sweats Denies having any melena or hematochezia.  No change in mental status.  Has history of easy bruisability and has been on Plavix and Ranexa.  Being followed by Dr. Raliegh Ip.   Past Medical History:  Diagnosis Date  . Angina pectoris ([place])   . Chronic kidney disease   . Drug-induced low platelet count   . Duodenal ulcer   . Granulomatous disease ([place])   . Hepatic disease   . Hypertension   . Low vitamin D level   . Neuropathy   . Osteopenia   . Parkinson's disease ([place])       . Splenomegaly a    Current Outpatient Medications  Medication Sig Dispense Refill  . acetaminophen (TYLENOL) 650 MG CR tablet Take 650 mg by mouth every 6 (six) hours as needed for  pain.    . bisacodyl (DULCOLAX) 5 MG EC tablet Take 15 mg by mouth at bedtime as needed for moderate constipation.    . clopidogrel (PLAVIX) 75 MG tablet Take 1 tablet (75 mg total) by mouth daily. 90 tablet 2  . cyclobenzaprine (FLEXERIL) 10 MG tablet TAKE 1 TABLET BY MOUTH EVERY 8 HOURS AS NEEDED 30 tablet 0  . diphenhydrAMINE (BENADRYL) 25 MG tablet Take 25 mg by mouth every 6 (six) hours as needed.    . gabapentin (NEURONTIN) 100 MG capsule Take one capsule q AM  and q Noon. 180 capsule 3  . gabapentin (NEURONTIN) 300 MG capsule Take 2 capsules (600 mg total) by mouth at bedtime. 180 capsule 3  . HYDROcodone-acetaminophen (NORCO/VICODIN) 5-325 MG tablet TAKE 1 TABLET BY MOUTH EVERY 6 HOURS AS NEEDED FOR MODERATE PAIN. 30 tablet 0  . pantoprazole (PROTONIX) 40 MG tablet Take 1 tablet (40 mg total) by mouth daily. 30 tablet 6  . promethazine (PHENERGAN) 25 MG tablet Take 1 tablet (25 mg total) by mouth every 6 (six) hours as needed for nausea or vomiting. 30 tablet 2  . ranolazine (RANEXA) 500 MG 12 hr tablet Take 1 tablet (500 mg total) by mouth 2 (two) times daily. 180 tablet 2  . Vitamin D, Ergocalciferol, (DRISDOL) 1.25 MG (50000 UT) CAPS capsule Take 1 capsule (50,000 Units total) by mouth every 7 (seven) days. (Patient taking differently: Take 50,000 Units by mouth every 7 (seven) days. On Wednesday) 16 capsule 0  . nitroGLYCERIN (NITROSTAT) 0.4 MG SL tablet Place 0.4 mg under the tongue every 5 (five) minutes as needed for chest pain.      Current Facility-Administered Medications  Medication Dose Route Frequency Provider Last Rate Last Admin  . bupivacaine (MARCAINE) 0.25 % (with pres) injection 2 mL  2 mL Other Once Magnus Sinning, MD        Past Surgical History:  Procedure Laterality Date  . ABDOMINAL HYSTERECTOMY    . APPENDECTOMY    . BACK SURGERY     thoracic-fractured   . BACK SURGERY     lumbar - fractured  . BREAST LUMPECTOMY WITH RADIOACTIVE SEED LOCALIZATION Right 08/01/2018   Procedure: RADIOACTIVE SEED GUIDED RIGHT BREAST LUMPECTOMY;  Surgeon: Coralie Keens, MD;  Location: [facility];  Service: General;  Laterality: Right;  . ESOPHAGOGASTRODUODENOSCOPY  05/20/2008   Mild to moderate esophagitis. Otherwise normal EGD.   Marland Kitchen EYE SURGERY Left    cataract surgery with lens implant  . GASTROSTOMY     peptic ulcer - peg tube placement    Family History  Problem Relation Age of Onset  . Cancer Mother        ovarian  .  Hyperlipidemia Mother   . Alzheimer's disease Mother   . Arthritis Mother        rheumatoid  . Cancer Father        lung  . Parkinson's disease Father   . Alzheimer's disease Father   . ALS Sister   . Cancer Maternal Aunt        pancreatic, lung, liver,breast  . Breast cancer Maternal Aunt   . Cancer Maternal Uncle        pancreatic, liver, brain  . Colon cancer Maternal Uncle   . Heart attack Paternal Aunt   . Stroke Paternal Aunt   . Stomach cancer Paternal Aunt   . Heart attack Paternal Uncle   . Stroke Paternal Grandmother   . Heart attack Paternal Grandfather   .  Arthritis Sister        rheumatoid  . Huntington's disease Daughter        presumed inherited from father per patient  . Colon cancer Maternal Uncle   . Pancreatic cancer Maternal Uncle   . Liver disease Maternal Uncle   . Breast cancer Maternal Aunt   . Breast cancer Maternal Aunt   . Esophageal cancer Neg Hx   . Rectal cancer Neg Hx     Social History   Tobacco Use  . Smoking status: Former Smoker    Packs/day: 1.00    Years: 22.00    Pack years: 22.00    Types: Cigarettes    Quit date: 06/26/1986    Years since quitting: 33.2  . Smokeless tobacco: Never Used  Substance Use Topics  . Alcohol use: No  . Drug use: No    Allergies  Allergen Reactions  . Iodinated Diagnostic Agents Hives  . Nsaids Other (See Comments)    History of bleeding ulcers  . Latex Hives and Rash  . Sinemet [Carbidopa W-Levodopa] Nausea And Vomiting  . Inderal [Propranolol] Other (See Comments)    Unknown  . Indomethacin Other (See Comments)    Unknown  . Penicillin G Other (See Comments)    Unknown  . Pneumococcal Vaccines Other (See Comments)    Caused "pneumonia"  . Scopolamine Other (See Comments)    The patch, caused her to pass out / change in mental status  . Tape Other (See Comments)    Unknown     Review of Systems: All systems reviewed and negative except where noted in HPI.    Physical Exam:      Ht 5' (1.524 m)   Wt 160 lb (72.6 kg)   BMI 31.25 kg/m  Not examined since it was a televisit.  . CBC Latest Ref Rng & Units 06/18/2019 07/25/2018 07/01/2018  WBC 3.4 - 10.8 x10E3/uL 5.4 4.8 5.8  Hemoglobin 11.1 - 15.9 g/dL 13.9 12.5 13.3  Hematocrit 34.0 - 46.6 % 39.5 37.6 38.9  Platelets 150 - 450 x10E3/uL 166 122(L) 119.0(L)   CMP Latest Ref Rng & Units 06/18/2019 07/25/2018 07/01/2018  Glucose 65 - 99 mg/dL 98 94 84  BUN 8 - 27 mg/dL 9 6(L) 10  Creatinine 0.57 - 1.00 mg/dL 0.79 0.69 0.60  Sodium 134 - 144 mmol/L 141 140 140  Potassium 3.5 - 5.2 mmol/L 3.6 3.8 4.7  Chloride 96 - 106 mmol/L 102 107 106  CO2 20 - 29 mmol/L 24 25 28   Calcium 8.7 - 10.3 mg/dL 9.4 9.2 9.5  Total Protein 6.0 - 8.5 g/dL 7.0 - 6.8  Total Bilirubin 0.0 - 1.2 mg/dL 0.9 - 0.6  Alkaline Phos 39 - 117 IU/L 89 - 78  AST 0 - 40 IU/L 17 - 15  ALT 0 - 32 IU/L 12 - 13   I connected with  Amy Moon on 09/09/19 by a phone visit (video-did not work due to Internet connection) and verified that I am speaking with the correct person using two identifiers.   I discussed the limitations of evaluation and management by telemedicine. The patient expressed understanding and agreed to proceed.  20min-including coordination of care     Stanisha Lorenz,MD 09/09/2019, 10:42 AM   CC [place], Berna Spare, NP

## 2019-09-09 NOTE — Patient Instructions (Signed)
If you are age 71 or older, your body mass index should be between 23-30. Your Body mass index is 31.25 kg/m. If this is out of the aforementioned range listed, please consider follow up with your Primary Care Provider.  If you are age 56 or younger, your body mass index should be between 19-25. Your Body mass index is 31.25 kg/m. If this is out of the aformentioned range listed, please consider follow up with your Primary Care Provider.   Please go to the lab at [facility] ([place] You will need to go to level "B", you do not need an appointment for this. Hours available are 7:30 am - 4:30 pm.    You have been scheduled for an abdominal ultrasound at [facility] (1st floor [address] ) on 09/17/19 at 2:30pm. Please arrive 15 minutes prior to your appointment for registration. Make certain not to have anything to eat or drink 6 hours prior to your appointment. Should you need to reschedule your appointment, please contact radiology at [phone]. This test typically takes about 30 minutes to perform.  We have sent the following medications to your pharmacy for you to pick up at your convenience: Protonix Phenergan  Follow up in 12 weeks.  Thank you,  Dr. Jackquline [place]

## 2019-09-10 ENCOUNTER — Other Ambulatory Visit: Payer: Self-pay | Admitting: Specialist

## 2019-09-12 ENCOUNTER — Telehealth: Payer: Self-pay

## 2019-09-12 NOTE — Telephone Encounter (Signed)
Spoke with patient, she inquired about scheduling surgery.  Need surgery sheet.  Also, she states she does not have a PCP but looks like you mention medical clearance.  Does she need to establish care with new one for clearance?

## 2019-09-17 ENCOUNTER — Other Ambulatory Visit: Payer: Self-pay

## 2019-09-17 ENCOUNTER — Other Ambulatory Visit (INDEPENDENT_AMBULATORY_CARE_PROVIDER_SITE_OTHER): Payer: Medicare Other

## 2019-09-17 ENCOUNTER — Ambulatory Visit (HOSPITAL_BASED_OUTPATIENT_CLINIC_OR_DEPARTMENT_OTHER)
Admission: RE | Admit: 2019-09-17 | Discharge: 2019-09-17 | Disposition: A | Discharge status: Home / Self Care | Payer: Medicare Other | Source: Ambulatory Visit | Attending: Gastroenterology | Admitting: Gastroenterology

## 2019-09-17 DIAGNOSIS — R1011 Right upper quadrant pain: Secondary | ICD-10-CM | POA: Insufficient documentation

## 2019-09-17 DIAGNOSIS — K7469 Other cirrhosis of liver: Secondary | ICD-10-CM

## 2019-09-17 DIAGNOSIS — R110 Nausea: Secondary | ICD-10-CM | POA: Diagnosis not present

## 2019-09-17 DIAGNOSIS — K8020 Calculus of gallbladder without cholecystitis without obstruction: Secondary | ICD-10-CM | POA: Diagnosis not present

## 2019-09-17 LAB — COMPREHENSIVE METABOLIC PANEL
ALT: 12 U/L (ref 0–35)
AST: 13 U/L (ref 0–37)
Albumin: 4.3 g/dL (ref 3.5–5.2)
Alkaline Phosphatase: 80 U/L (ref 39–117)
BUN: 11 mg/dL (ref 6–23)
CO2: 30 mEq/L (ref 19–32)
Calcium: 9.4 mg/dL (ref 8.4–10.5)
Chloride: 103 mEq/L (ref 96–112)
Creatinine, Ser: 0.67 mg/dL (ref 0.40–1.20)
GFR: 86.72 mL/min (ref >60.00)
Glucose, Bld: 88 mg/dL (ref 70–99)
Potassium: 3.7 mEq/L (ref 3.5–5.1)
Sodium: 139 mEq/L (ref 135–145)
Total Bilirubin: 1 mg/dL (ref 0.2–1.2)
Total Protein: 7.2 g/dL (ref 6.0–8.3)

## 2019-09-17 LAB — PROTIME-INR
INR: 1 ratio (ref 0.8–1.0)
Prothrombin Time: 11.7 s (ref 9.6–13.1)

## 2019-09-17 LAB — CBC WITH DIFFERENTIAL/PLATELET
Basophils Absolute: 0 10*3/uL (ref 0.0–0.1)
Basophils Relative: 0.7 % (ref 0.0–3.0)
Eosinophils Absolute: 0.1 10*3/uL (ref 0.0–0.7)
Eosinophils Relative: 2.4 % (ref 0.0–5.0)
HCT: 38.2 % (ref 36.0–46.0)
Hemoglobin: 13 g/dL (ref 12.0–15.0)
Lymphocytes Relative: 29.5 % (ref 12.0–46.0)
Lymphs Abs: 1.4 10*3/uL (ref 0.7–4.0)
MCHC: 34 g/dL (ref 30.0–36.0)
MCV: 95.4 fl (ref 78.0–100.0)
Monocytes Absolute: 0.3 10*3/uL (ref 0.1–1.0)
Monocytes Relative: 6 % (ref 3.0–12.0)
Neutro Abs: 3 10*3/uL (ref 1.4–7.7)
Neutrophils Relative %: 61.4 % (ref 43.0–77.0)
Platelets: 137 10*3/uL — ABNORMAL LOW (ref 150.0–400.0)
RBC: 4.01 Mil/uL (ref 3.87–5.11)
RDW: 12.3 % (ref 11.5–15.5)
WBC: 4.9 10*3/uL (ref 4.0–10.5)

## 2019-09-17 LAB — LIPASE: Lipase: 7 U/L — ABNORMAL LOW (ref 11.0–59.0)

## 2019-09-17 NOTE — Telephone Encounter (Signed)
I put blue sheet on his desk

## 2019-09-18 ENCOUNTER — Telehealth: Payer: Self-pay

## 2019-09-18 ENCOUNTER — Other Ambulatory Visit: Payer: Self-pay

## 2019-09-18 DIAGNOSIS — K8020 Calculus of gallbladder without cholecystitis without obstruction: Secondary | ICD-10-CM

## 2019-09-18 LAB — AFP TUMOR MARKER: AFP-Tumor Marker: 4.2 ng/mL

## 2019-09-18 NOTE — Telephone Encounter (Signed)
Staff message sent to MD for review of results

## 2019-09-18 NOTE — Telephone Encounter (Signed)
Call received from [facility]- [place] abd complete has been resulted-please review and advise

## 2019-09-22 ENCOUNTER — Other Ambulatory Visit: Payer: Self-pay | Admitting: Specialist

## 2019-09-22 NOTE — Telephone Encounter (Signed)
T 69 probably a good idea to have a primary care physician to be sure there is no medical issues that can lead  To unexpected complications like carotid artery stenosis or hardening of the arteries of the neck or thyroid conditions or Heart disease. jen

## 2019-09-29 ENCOUNTER — Telehealth: Payer: Self-pay | Admitting: Gastroenterology

## 2019-09-29 NOTE — Telephone Encounter (Signed)
The pt has been advised that her husband can drop her off for the test and come back and pick her up.  We discussed the instructions to remain NPO 6 hours prior.  The pt has been advised of the information and verbalized understanding.

## 2019-09-30 ENCOUNTER — Encounter (HOSPITAL_COMMUNITY)
Admission: RE | Admit: 2019-09-30 | Discharge: 2019-09-30 | Disposition: A | Discharge status: Home / Self Care | Payer: Medicare Other | Source: Ambulatory Visit | Attending: Gastroenterology | Admitting: Gastroenterology

## 2019-09-30 ENCOUNTER — Other Ambulatory Visit: Payer: Self-pay

## 2019-09-30 DIAGNOSIS — K8020 Calculus of gallbladder without cholecystitis without obstruction: Secondary | ICD-10-CM | POA: Diagnosis not present

## 2019-09-30 DIAGNOSIS — R1031 Right lower quadrant pain: Secondary | ICD-10-CM | POA: Diagnosis not present

## 2019-09-30 DIAGNOSIS — R1011 Right upper quadrant pain: Secondary | ICD-10-CM | POA: Diagnosis not present

## 2019-09-30 MED ORDER — TECHNETIUM TC 99M MEBROFENIN IV KIT
5.4000 | PACK | Freq: Once | INTRAVENOUS | Status: AC | PRN
  Administered 2019-09-30: 5.4 via INTRAVENOUS

## 2019-10-01 ENCOUNTER — Other Ambulatory Visit: Payer: Self-pay | Admitting: Specialist

## 2019-10-09 ENCOUNTER — Encounter: Payer: Self-pay | Admitting: Specialist

## 2019-10-09 ENCOUNTER — Ambulatory Visit: Payer: Self-pay

## 2019-10-09 ENCOUNTER — Ambulatory Visit (INDEPENDENT_AMBULATORY_CARE_PROVIDER_SITE_OTHER): Payer: Medicare Other | Admitting: Specialist

## 2019-10-09 ENCOUNTER — Other Ambulatory Visit: Payer: Self-pay

## 2019-10-09 VITALS — BP 152/83 | HR 102 | Ht 60.0 in | Wt 160.0 lb

## 2019-10-09 DIAGNOSIS — M1712 Unilateral primary osteoarthritis, left knee: Secondary | ICD-10-CM

## 2019-10-09 DIAGNOSIS — M25561 Pain in right knee: Secondary | ICD-10-CM | POA: Diagnosis not present

## 2019-10-09 DIAGNOSIS — M25461 Effusion, right knee: Secondary | ICD-10-CM | POA: Diagnosis not present

## 2019-10-09 MED ORDER — BUPIVACAINE HCL 0.25 % IJ SOLN
4.0000 mL | INTRAMUSCULAR | Status: AC | PRN
  Administered 2019-10-09: 4 mL via INTRA_ARTICULAR

## 2019-10-09 MED ORDER — GABAPENTIN 300 MG PO CAPS: ORAL_CAPSULE | ORAL | 6 refills | Status: DC

## 2019-10-09 MED ORDER — LIDOCAINE HCL 1 % IJ SOLN
3.0000 mL | INTRAMUSCULAR | Status: AC | PRN
  Administered 2019-10-09: 3 mL

## 2019-10-09 MED ORDER — METHYLPREDNISOLONE ACETATE 40 MG/ML IJ SUSP
40.0000 mg | INTRAMUSCULAR | Status: AC | PRN
  Administered 2019-10-09: 40 mg via INTRA_ARTICULAR

## 2019-10-09 NOTE — Patient Instructions (Signed)
The main ways of treat osteoarthritis, that are found to be success. Weight loss helps to decrease pain. Exercise is important to maintaining cartilage and thickness and strengthening. NSAIDs like motrin, tylenol, alleve are meds decreasing the inflamation. Ice is okay  In afternoon and evening and hot shower in the am I believe the knee arthritis findings worsened due to stress across the irregular joint line and this causes a pop and and  Acute inflamatory changes. Steroid and marcaine should help. If the pain and swelling does not improve with this injection then joint replacement surgery may be necessary to relieve the pain.

## 2019-10-09 NOTE — Progress Notes (Signed)
Office Visit Note   Patient: Amy Moon           Date of Birth: 1950/08/24           MRN: [id number] Visit Date: 10/09/2019              Requested by: No referring provider defined for this encounter. PCP: Patient, No Pcp Per   Assessment & Plan: Visit Diagnoses:  1. Right knee pain, unspecified chronicity   2. Unilateral primary osteoarthritis, left knee   3. Effusion, right knee     Plan: The main ways of treat osteoarthritis, that are found to be success. Weight loss helps to decrease pain. Exercise is important to maintaining cartilage and thickness and strengthening. NSAIDs like motrin, tylenol, alleve are meds decreasing the inflamation. Ice is okay  In afternoon and evening and hot shower in the am I believe the knee arthritis findings worsened due to stress across the irregular joint line and this causes a pop and and  Acute inflamatory changes. Steroid and marcaine should help. If the pain and swelling does not improve with this injection then joint replacement surgery may be necessary to relieve the pain.   Follow-Up Instructions: No follow-ups on file.   Orders:  Orders Placed This Encounter  Procedures  . Large Joint Inj: R knee  . XR Knee 1-2 Views Right   No orders of the defined types were placed in this encounter.     Procedures: Large Joint Inj: R knee on 10/09/2019 2:38 PM Indications: pain Details: 18 G 1.5 in needle, superolateral approach  Arthrogram: No  Medications: 40 mg methylPREDNISolone acetate 40 MG/ML; 3 mL lidocaine 1 %; 4 mL bupivacaine 0.25 % Aspirate: clear Outcome: tolerated well, no immediate complications  Right knee effusion with 5 cc of clear viscous synovial fluid aspirated.  Procedure, treatment alternatives, risks and benefits explained, specific risks discussed. Consent was given by the patient. Immediately prior to procedure a time out was called to verify the correct patient, procedure, equipment, support staff and  site/side marked as required. Patient was prepped and draped in the usual sterile fashion.       Clinical Data: No additional findings.   Subjective: Chief Complaint  Patient presents with  . Neck - Follow-up  . Right Knee - Pain    69 year old female with history of lumbar DDD and lumbar spinal stenosis and cervical spondylosis. She reports she was getting up off the Floor 3 days ago and felt a pop in the right knee with pain and swelling. The swelling has persistent and she is having difficulty bending or straightening the right knee. She is keeping on the floor now. She hurts with straightening and bending the right knee. Able to weight bear standing but any bend or twist increases the pain. She is to undergo a cervical spine surgery for spondylosis and right sided nerve compression and persistent right shoulder pain. She is to see a cardiologist for a preop evaluation and if cleared we with schedule intervention prior to any knee replacement surgery.She is on chronic plavix for carotid artery stenosis.   Review of Systems  Constitutional: Negative.   HENT: Positive for congestion, sinus pressure and sinus pain.   Eyes: Positive for redness and itching.  Respiratory: Negative.   Cardiovascular: Negative.   Gastrointestinal: Negative.   Endocrine: Negative.   Genitourinary: Negative.   Musculoskeletal: Positive for arthralgias and joint swelling.  Skin: Negative.   Allergic/Immunologic: Negative.   Neurological:  Negative.   Hematological: Negative.   Psychiatric/Behavioral: Negative.      Objective: Vital Signs: BP (!) 152/83 (BP Location: Left Arm, Patient Position: Sitting)   Pulse (!) 102   Ht 5' (1.524 m)   Wt 160 lb (72.6 kg)   BMI 31.25 kg/m   Physical Exam Constitutional:      Appearance: She is well-developed.  HENT:     Head: Normocephalic and atraumatic.  Eyes:     Pupils: Pupils are equal, round, and reactive to light.  Pulmonary:     Effort:  Pulmonary effort is normal.     Breath sounds: Normal breath sounds.  Abdominal:     General: Bowel sounds are normal.     Palpations: Abdomen is soft.  Musculoskeletal:     Cervical back: Normal range of motion and neck supple.     Right knee: Effusion present.     Instability Tests: Negative anterior drawer test. Negative posterior drawer test. Positive medial McMurray test.  Skin:    General: Skin is warm and dry.  Neurological:     Mental Status: She is alert and oriented to person, place, and time.  Psychiatric:        Behavior: Behavior normal.        Thought Content: Thought content normal.        Judgment: Judgment normal.     Right Knee Exam   Tenderness  The patient is experiencing tenderness in the medial joint line and medial retinaculum.  Range of Motion  Extension:  -15 abnormal  Flexion:  100 abnormal   Tests  McMurray:  Medial - positive  Valgus: positive Lachman:  Anterior - negative    Posterior - negative Drawer:  Anterior - negative    Posterior - negative Pivot shift: trace Patellar apprehension: negative  Other  Effusion: effusion present  Comments:  Mild right knee effusion, suprapatella effusion and swelling present.  Knee aspirated and injected.    Left Knee Exam  Left knee exam is normal.      Specialty Comments:  No specialty comments available.  Imaging: No results found.   PMFS History: Patient Active Problem List   Diagnosis Date Noted  . Bronchitis 06/03/2018  . Encounter for Medicare annual wellness exam 05/09/2018  . Vitamin D deficiency 11/05/2017  . Encounter for hepatitis C virus screening test for high risk patient 11/05/2017  . Dysphagia 09/03/2017  . Thrombocytopenia ([place]) 09/03/2017  . Healthcare maintenance 07/30/2017  . Chronic pain syndrome 07/30/2017  . Chest pain 07/30/2017  . Parkinson's disease ([place]) 07/30/2017  . Hypertension 07/30/2017  . Severe obesity (BMI >= 40) ([place]) 05/15/2016  . Severe  episode of recurrent major depressive disorder, without psychotic features ([place]) 05/15/2016  . Tremor of both hands 05/15/2016  . Irritant contact dermatitis 08/25/2015   Past Medical History:  Diagnosis Date  . Allergy   . Anemia   . Angina pectoris ([place])   . Arthritis   . Asthma    years ago  . Cancer (HCC)    squamous cell carcinoma, nose  . Cataract   . Chronic kidney disease    possibly per pt  . Cirrhosis ([place])   . Clotting disorder (HCC)    platelets are low  . Depression   . Drug-induced low platelet count   . Duodenal ulcer   . Enlarged thyroid   . Family history of adverse reaction to anesthesia    father had difficulty waking up and breathing on his own  after surgery  . GERD (gastroesophageal reflux disease)   . Granulomatous disease ([place])   . Hepatic disease   . Hiatal hernia   . History of kidney stones   . Hypertension   . Low vitamin D level   . Neuropathy   . Osteopenia   . Osteoporosis   . Parkinson's disease ([place])   . Pneumonia   . Portal hypertension ([place])   . Splenomegaly a    Family History  Problem Relation Age of Onset  . Cancer Mother        ovarian  . Hyperlipidemia Mother   . Alzheimer's disease Mother   . Arthritis Mother        rheumatoid  . Cancer Father        lung  . Parkinson's disease Father   . Alzheimer's disease Father   . ALS Sister   . Cancer Maternal Aunt        pancreatic, lung, liver,breast  . Breast cancer Maternal Aunt   . Cancer Maternal Uncle        pancreatic, liver, brain  . Colon cancer Maternal Uncle   . Heart attack Paternal Aunt   . Stroke Paternal Aunt   . Stomach cancer Paternal Aunt   . Heart attack Paternal Uncle   . Stroke Paternal Grandmother   . Heart attack Paternal Grandfather   . Arthritis Sister        rheumatoid  . Huntington's disease Daughter        presumed inherited from father per patient  . Colon cancer Maternal Uncle   . Pancreatic cancer Maternal Uncle   . Liver disease  Maternal Uncle   . Breast cancer Maternal Aunt   . Breast cancer Maternal Aunt   . Esophageal cancer Neg Hx   . Rectal cancer Neg Hx     Past Surgical History:  Procedure Laterality Date  . ABDOMINAL HYSTERECTOMY    . APPENDECTOMY    . BACK SURGERY     thoracic-fractured   . BACK SURGERY     lumbar - fractured  . BREAST LUMPECTOMY WITH RADIOACTIVE SEED LOCALIZATION Right 08/01/2018   Procedure: RADIOACTIVE SEED GUIDED RIGHT BREAST LUMPECTOMY;  Surgeon: Coralie Keens, MD;  Location: [facility];  Service: General;  Laterality: Right;  . ESOPHAGOGASTRODUODENOSCOPY  05/20/2008   Mild to moderate esophagitis. Otherwise normal EGD.   Marland Kitchen EYE SURGERY Left    cataract surgery with lens implant  . GASTROSTOMY     peptic ulcer - peg tube placement   Social History   Occupational History  . Not on file  Tobacco Use  . Smoking status: Former Smoker    Packs/day: 1.00    Years: 22.00    Pack years: 22.00    Types: Cigarettes    Quit date: 06/26/1986    Years since quitting: 33.3  . Smokeless tobacco: Never Used  Substance and Sexual Activity  . Alcohol use: No  . Drug use: No  . Sexual activity: Not Currently    Birth control/protection: None

## 2019-10-14 ENCOUNTER — Other Ambulatory Visit: Payer: Self-pay | Admitting: Specialist

## 2019-10-17 ENCOUNTER — Other Ambulatory Visit: Payer: Self-pay

## 2019-10-17 ENCOUNTER — Encounter: Payer: Self-pay | Admitting: Cardiology

## 2019-10-17 ENCOUNTER — Ambulatory Visit (INDEPENDENT_AMBULATORY_CARE_PROVIDER_SITE_OTHER): Payer: Medicare Other | Admitting: Cardiology

## 2019-10-17 VITALS — BP 128/70 | HR 96 | Ht 60.0 in | Wt 162.0 lb

## 2019-10-17 DIAGNOSIS — R0600 Dyspnea, unspecified: Secondary | ICD-10-CM | POA: Diagnosis not present

## 2019-10-17 DIAGNOSIS — R072 Precordial pain: Secondary | ICD-10-CM | POA: Diagnosis not present

## 2019-10-17 DIAGNOSIS — I10 Essential (primary) hypertension: Secondary | ICD-10-CM

## 2019-10-17 DIAGNOSIS — R0609 Other forms of dyspnea: Secondary | ICD-10-CM | POA: Insufficient documentation

## 2019-10-17 HISTORY — DX: Other forms of dyspnea: R06.09

## 2019-10-17 HISTORY — DX: Dyspnea, unspecified: R06.00

## 2019-10-17 NOTE — Progress Notes (Signed)
Cardiology Office Note:    Date:  10/17/2019   ID:  [name], DOB Aug 05, 1950, MRN [id number]  PCP:  Patient, No Pcp Per  Cardiologist:  Jenne Campus, MD    Referring MD: No ref. provider found   Chief Complaint  Patient presents with  . Follow-up    6 Months  Cough shortness of breath  History of Present Illness:    Amy Moon is a 69 y.o. female with past medical history significant for chest pain.  She did have a stress test done in summer 2019 which was low risk, after that she was treated with medical therapy with good response.  She has been also complaining of having dyspnea on exertion and that sensation became worse lately.  Past medical history also significant for chronic kidney disease, dyslipidemia.  She comes today to my office for follow-up leading complaint this time is shortness of breath.  She said for last 3 to 4 months he is getting easily short of breath while walking.  She also described to have some swelling of lower extremities is happening at the meantime.  I described the fact that she had difficulty falling asleep but wakes up many times during the night.  Sometimes she thinks she got shortness of breath at night when she wakes up.  Still described to have chest pain on and off not related to exercise usually.  Past Medical History:  Diagnosis Date  . Allergy   . Anemia   . Angina pectoris ([place])   . Arthritis   . Asthma    years ago  . Cancer (HCC)    squamous cell carcinoma, nose  . Cataract   . Chronic kidney disease    possibly per pt  . Cirrhosis ([place])   . Clotting disorder (HCC)    platelets are low  . Depression   . Drug-induced low platelet count   . Duodenal ulcer   . Enlarged thyroid   . Family history of adverse reaction to anesthesia    father had difficulty waking up and breathing on his own after surgery  . GERD (gastroesophageal reflux disease)   . Granulomatous disease ([place])   . Hepatic disease   . Hiatal hernia   .  History of kidney stones   . Hypertension   . Low vitamin D level   . Neuropathy   . Osteopenia   . Osteoporosis   . Parkinson's disease ([place])   . Pneumonia   . Portal hypertension ([place])   . Splenomegaly a    Past Surgical History:  Procedure Laterality Date  . ABDOMINAL HYSTERECTOMY    . APPENDECTOMY    . BACK SURGERY     thoracic-fractured   . BACK SURGERY     lumbar - fractured  . BREAST LUMPECTOMY WITH RADIOACTIVE SEED LOCALIZATION Right 08/01/2018   Procedure: RADIOACTIVE SEED GUIDED RIGHT BREAST LUMPECTOMY;  Surgeon: Coralie Keens, MD;  Location: [facility];  Service: General;  Laterality: Right;  . ESOPHAGOGASTRODUODENOSCOPY  05/20/2008   Mild to moderate esophagitis. Otherwise normal EGD.   Marland Kitchen EYE SURGERY Left    cataract surgery with lens implant  . GASTROSTOMY     peptic ulcer - peg tube placement    Current Medications: Current Meds  Medication Sig  . acetaminophen (TYLENOL) 650 MG CR tablet Take 650 mg by mouth every 6 (six) hours as needed for pain.  . bisacodyl (DULCOLAX) 5 MG EC tablet Take 15 mg by mouth at bedtime as needed  for moderate constipation.  . clopidogrel (PLAVIX) 75 MG tablet Take 1 tablet (75 mg total) by mouth daily.  . cyclobenzaprine (FLEXERIL) 10 MG tablet TAKE 1 TABLET BY MOUTH EVERY 8 HOURS AS NEEDED  . diphenhydrAMINE (BENADRYL) 25 MG tablet Take 25 mg by mouth every 6 (six) hours as needed.  . gabapentin (NEURONTIN) 300 MG capsule Take 2 capsules (600 mg total) by mouth at bedtime AND 1 capsule (300 mg total) 2 (two) times daily.  Marland Kitchen HYDROcodone-acetaminophen (NORCO/VICODIN) 5-325 MG tablet TAKE 1 TABLET BY MOUTH EVERY 6 HOURS AS NEEDED FOR MODERATE PAIN.  . nitroGLYCERIN (NITROSTAT) 0.4 MG SL tablet Place 0.4 mg under the tongue every 5 (five) minutes as needed for chest pain.   . pantoprazole (PROTONIX) 40 MG tablet Take 1 tablet (40 mg total) by mouth daily.  . promethazine (PHENERGAN) 25 MG tablet Take 1 tablet (25 mg total) by mouth every  8 (eight) hours as needed for nausea or vomiting.  . ranolazine (RANEXA) 500 MG 12 hr tablet Take 1 tablet (500 mg total) by mouth 2 (two) times daily.  . Vitamin D, Ergocalciferol, (DRISDOL) 1.25 MG (50000 UT) CAPS capsule Take 1 capsule (50,000 Units total) by mouth every 7 (seven) days. (Patient taking differently: Take 50,000 Units by mouth every 7 (seven) days. On Wednesday)   Current Facility-Administered Medications for the 10/17/19 encounter (Office Visit) with Park Liter, MD  Medication  . bupivacaine (MARCAINE) 0.25 % (with pres) injection 2 mL     Allergies:   Iodinated diagnostic agents, Nsaids, Latex, Sinemet [carbidopa w-levodopa], Inderal [propranolol], Indomethacin, Penicillin g, Pneumococcal vaccines, Scopolamine, and Tape   Social History   Socioeconomic History  . Marital status: Married    Spouse name: Not on file  . Number of children: 3  . Years of education: Not on file  . Highest education level: Not on file  Occupational History  . Not on file  Tobacco Use  . Smoking status: Former Smoker    Packs/day: 1.00    Years: 22.00    Pack years: 22.00    Types: Cigarettes    Quit date: 06/26/1986    Years since quitting: 33.3  . Smokeless tobacco: Never Used  Substance and Sexual Activity  . Alcohol use: No  . Drug use: No  . Sexual activity: Not Currently    Birth control/protection: None  Other Topics Concern  . Not on file  Social History Narrative  . Not on file   Social Determinants of Health   Financial Resource Strain:   . Difficulty of Paying Living Expenses:   Food Insecurity:   . Worried About Charity fundraiser in the Last Year:   . Arboriculturist in the Last Year:   Transportation Needs:   . Film/video editor (Medical):   Marland Kitchen Lack of Transportation (Non-Medical):   Physical Activity:   . Days of Exercise per Week:   . Minutes of Exercise per Session:   Stress:   . Feeling of Stress :   Social Connections:   . Frequency of  Communication with Friends and Family:   . Frequency of Social Gatherings with Friends and Family:   . Attends Religious Services:   . Active Member of Clubs or Organizations:   . Attends Archivist Meetings:   Marland Kitchen Marital Status:      Family History: The patient's family history includes ALS in her sister; Alzheimer's disease in her father and mother; Arthritis in her  mother and sister; Breast cancer in her maternal aunt, maternal aunt, and maternal aunt; Cancer in her father, maternal aunt, maternal uncle, and mother; Colon cancer in her maternal uncle and maternal uncle; Heart attack in her paternal aunt, paternal grandfather, and paternal uncle; Huntington's disease in her daughter; Hyperlipidemia in her mother; Liver disease in her maternal uncle; Pancreatic cancer in her maternal uncle; Parkinson's disease in her father; Stomach cancer in her paternal aunt; Stroke in her paternal aunt and paternal grandmother. There is no history of Esophageal cancer or Rectal cancer. ROS:   Please see the history of present illness.    All 14 point review of systems negative except as described per history of present illness  EKGs/Labs/Other Studies Reviewed:      Recent Labs: 06/18/2019: TSH 0.254 09/17/2019: ALT 12; BUN 11; Creatinine, Ser 0.67; Hemoglobin 13.0; Platelets 137.0; Potassium 3.7; Sodium 139  Recent Lipid Panel    Component Value Date/Time   CHOL 191 06/18/2019 1009   TRIG 87 06/18/2019 1009   HDL 54 06/18/2019 1009   CHOLHDL 3.5 06/18/2019 1009   LDLCALC 121 (H) 06/18/2019 1009    Physical Exam:    VS:  BP 128/70   Pulse 96   Ht 5' (1.524 m)   Wt 162 lb (73.5 kg)   SpO2 97%   BMI 31.64 kg/m     Wt Readings from Last 3 Encounters:  10/17/19 162 lb (73.5 kg)  10/09/19 160 lb (72.6 kg)  09/09/19 160 lb (72.6 kg)     GEN:  Well nourished, well developed in no acute distress HEENT: Normal NECK: No JVD; No carotid bruits LYMPHATICS: No  lymphadenopathy CARDIAC: RRR, no murmurs, no rubs, no gallops RESPIRATORY:  Clear to auscultation without rales, wheezing or rhonchi  ABDOMEN: Soft, non-tender, non-distended MUSCULOSKELETAL:  No edema; No deformity  SKIN: Warm and dry LOWER EXTREMITIES: no swelling NEUROLOGIC:  Alert and oriented x 3 PSYCHIATRIC:  Normal affect   ASSESSMENT:    1. Essential hypertension   2. Dyspnea on exertion   3. Precordial pain    PLAN:    In order of problems listed above:  1. Dyspnea on exertion obviously concerning.  I will ask him to have another echocardiogram done to assess left ventricle ejection fraction, will also do proBNP as well as Chem-7 with anticipation of potential treatment. 2. Essential hypertension.  Blood pressure seems to be well controlled continue present management. 3. Precordial pain still present but stable.  I will continue present management which include ranolazine as well as nitroglycerin as needed.  She did not have the use of ranolazine lately.  We will continue with Plavix since she is allergic to aspirin.   Medication Adjustments/Labs and Tests Ordered: Current medicines are reviewed at length with the patient today.  Concerns regarding medicines are outlined above.  No orders of the defined types were placed in this encounter.  Medication changes: No orders of the defined types were placed in this encounter.   Signed, Park Liter, MD, [facility] 10/17/2019 4:42 PM    [place]

## 2019-10-17 NOTE — Patient Instructions (Signed)
Medication Instructions:  Your physician recommends that you continue on your current medications as directed. Please refer to the Current Medication list given to you today.  *If you need a refill on your cardiac medications before your next appointment, please call your pharmacy*   Lab Work: Your physician recommends that you have lab work today: pro bnp/bmet  If you have labs (blood work) drawn today and your tests are completely normal, you will receive your results only by: Marland Kitchen MyChart Message (if you have MyChart) OR . A paper copy in the mail If you have any lab test that is abnormal or we need to change your treatment, we will call you to review the results.   Testing/Procedures: Your physician has requested that you have an echocardiogram. Echocardiography is a painless test that uses sound waves to create images of your heart. It provides your doctor with information about the size and shape of your heart and how well your heart's chambers and valves are working. This procedure takes approximately one hour. There are no restrictions for this procedure.   Echocardiogram An echocardiogram is a procedure that uses painless sound waves (ultrasound) to produce an image of the heart. Images from an echocardiogram can provide important information about:  Signs of coronary artery disease (CAD).  Aneurysm detection. An aneurysm is a weak or damaged part of an artery wall that bulges out from the normal force of blood pumping through the body.  Heart size and shape. Changes in the size or shape of the heart can be associated with certain conditions, including heart failure, aneurysm, and CAD.  Heart muscle function.  Heart valve function.  Signs of a past heart attack.  Fluid buildup around the heart.  Thickening of the heart muscle.  A tumor or infectious growth around the heart valves. Tell a health care provider about:  Any allergies you have.  All medicines you are  taking, including vitamins, herbs, eye drops, creams, and over-the-counter medicines.  Any blood disorders you have.  Any surgeries you have had.  Any medical conditions you have.  Whether you are pregnant or may be pregnant. What are the risks? Generally, this is a safe procedure. However, problems may occur, including:  Allergic reaction to dye (contrast) that may be used during the procedure. What happens before the procedure? No specific preparation is needed. You may eat and drink normally. What happens during the procedure?   An IV tube may be inserted into one of your veins.  You may receive contrast through this tube. A contrast is an injection that improves the quality of the pictures from your heart.  A gel will be applied to your chest.  A wand-like tool (transducer) will be moved over your chest. The gel will help to transmit the sound waves from the transducer.  The sound waves will harmlessly bounce off of your heart to allow the heart images to be captured in real-time motion. The images will be recorded on a computer. The procedure may vary among health care providers and hospitals. What happens after the procedure?  You may return to your normal, everyday life, including diet, activities, and medicines, unless your health care provider tells you not to do that. Summary  An echocardiogram is a procedure that uses painless sound waves (ultrasound) to produce an image of the heart.  Images from an echocardiogram can provide important information about the size and shape of your heart, heart muscle function, heart valve function, and fluid buildup around  your heart.  You do not need to do anything to prepare before this procedure. You may eat and drink normally.  After the echocardiogram is completed, you may return to your normal, everyday life, unless your health care provider tells you not to do that. This information is not intended to replace advice given to  you by your health care provider. Make sure you discuss any questions you have with your health care provider. Document Revised: 10/03/2018 Document Reviewed: 07/15/2016 Elsevier Patient Education  [place]: At [facility], you and your health needs are our priority.  As part of our continuing mission to provide you with exceptional heart care, we have created designated Provider Care Teams.  These Care Teams include your primary Cardiologist (physician) and Advanced Practice Providers (APPs -  Physician Assistants and Nurse Practitioners) who all work together to provide you with the care you need, when you need it.  We recommend signing up for the patient portal called "MyChart".  Sign up information is provided on this After Visit Summary.  MyChart is used to connect with patients for Virtual Visits (Telemedicine).  Patients are able to view lab/test results, encounter notes, upcoming appointments, etc.  Non-urgent messages can be sent to your provider as well.   To learn more about what you can do with MyChart, go to [email or web address].    Your next appointment:   1 month(s)  The format for your next appointment:   In Person  Provider:   Jenne Campus, MD   Other Instructions

## 2019-10-18 LAB — BASIC METABOLIC PANEL
BUN/Creatinine Ratio: 14 (ref 12–28)
BUN: 11 mg/dL (ref 8–27)
CO2: 25 mmol/L (ref 20–29)
Calcium: 9.5 mg/dL (ref 8.7–10.3)
Chloride: 104 mmol/L (ref 96–106)
Creatinine, Ser: 0.76 mg/dL (ref 0.57–1.00)
GFR calc Af Amer: 91 mL/min/{1.73_m2} (ref >59)
GFR calc non Af Amer: 79 mL/min/{1.73_m2} (ref >59)
Glucose: 85 mg/dL (ref 65–99)
Potassium: 3.9 mmol/L (ref 3.5–5.2)
Sodium: 139 mmol/L (ref 134–144)

## 2019-10-18 LAB — PRO B NATRIURETIC PEPTIDE: NT-Pro BNP: 33 pg/mL (ref 0–301)

## 2019-10-21 ENCOUNTER — Encounter: Payer: Self-pay | Admitting: Family Medicine

## 2019-10-21 ENCOUNTER — Other Ambulatory Visit: Payer: Self-pay

## 2019-10-21 ENCOUNTER — Ambulatory Visit (INDEPENDENT_AMBULATORY_CARE_PROVIDER_SITE_OTHER): Payer: Medicare Other | Admitting: Family Medicine

## 2019-10-21 VITALS — BP 144/85 | HR 98 | Temp 97.4°F | Ht 60.0 in | Wt 169.1 lb

## 2019-10-21 DIAGNOSIS — Z7189 Other specified counseling: Secondary | ICD-10-CM

## 2019-10-21 DIAGNOSIS — E66811 Obesity, class 1: Secondary | ICD-10-CM

## 2019-10-21 DIAGNOSIS — Z77090 Contact with and (suspected) exposure to asbestos: Secondary | ICD-10-CM

## 2019-10-21 DIAGNOSIS — E669 Obesity, unspecified: Secondary | ICD-10-CM

## 2019-10-21 DIAGNOSIS — I10 Essential (primary) hypertension: Secondary | ICD-10-CM | POA: Diagnosis not present

## 2019-10-21 DIAGNOSIS — Z87891 Personal history of nicotine dependence: Secondary | ICD-10-CM

## 2019-10-21 DIAGNOSIS — R0600 Dyspnea, unspecified: Secondary | ICD-10-CM

## 2019-10-21 DIAGNOSIS — Z9889 Other specified postprocedural states: Secondary | ICD-10-CM

## 2019-10-21 DIAGNOSIS — N183 Chronic kidney disease, stage 3 unspecified: Secondary | ICD-10-CM

## 2019-10-21 DIAGNOSIS — R079 Chest pain, unspecified: Secondary | ICD-10-CM | POA: Diagnosis not present

## 2019-10-21 DIAGNOSIS — D696 Thrombocytopenia, unspecified: Secondary | ICD-10-CM

## 2019-10-21 DIAGNOSIS — G479 Sleep disorder, unspecified: Secondary | ICD-10-CM

## 2019-10-21 DIAGNOSIS — Z8489 Family history of other specified conditions: Secondary | ICD-10-CM | POA: Insufficient documentation

## 2019-10-21 DIAGNOSIS — R0609 Other forms of dyspnea: Secondary | ICD-10-CM

## 2019-10-21 HISTORY — DX: Chronic kidney disease, stage 3 unspecified: N18.30

## 2019-10-21 HISTORY — DX: Personal history of nicotine dependence: Z87.891

## 2019-10-21 HISTORY — DX: Sleep disorder, unspecified: G47.9

## 2019-10-21 HISTORY — DX: Other specified postprocedural states: Z98.890

## 2019-10-21 HISTORY — DX: Contact with and (suspected) exposure to asbestos: Z77.090

## 2019-10-21 NOTE — Patient Instructions (Addendum)
You are not cleared for surgery and will need to have several issues addressed before that can happen.   Please keep a blood pressure log at home- check it daily and write down the heart rate and blood pressures and bring those numbers to each office visit you have        Angina   Angina is extreme discomfort in the chest, neck, arm, jaw, or back. The discomfort is caused by a lack of blood in the middle layer of the heart wall (myocardium). There are four types of angina:  Stable angina. This is triggered by vigorous activity or exercise. It goes away when you rest or take angina medicine.  Unstable angina. This is a warning sign and can lead to a heart attack (acute coronary syndrome). This is a medical emergency. Symptoms come at rest and last a long time.  Microvascular angina. This affects the small coronary arteries. Symptoms include feeling tired and being short of breath.  Prinzmetal or variant angina. This is caused by a tightening (spasm) of the arteries that go to your heart. What are the causes? This condition is caused by atherosclerosis. This is the buildup of fat and cholesterol (plaque) in your arteries. The plaque may narrow or block the artery. Other causes of angina include:  Sudden tightening of the muscles of the arteries in the heart (coronary spasm).  Small artery disease (microvascular dysfunction).  Problems with any of your heart valves (heart valve disease).  A tear in an artery in your heart (coronary artery dissection).  Diseases of the heart muscle (cardiomyopathy), or other heart diseases. What increases the risk? You are more likely to develop this condition if you have:  High cholesterol.  High blood pressure (hypertension).  Diabetes.  A family history of heart disease.  An inactive (sedentary) lifestyle, or you do not exercise enough.  Depression.  Had radiation treatment to the left side of your chest. Other risk factors  include:  Using tobacco.  Being obese.  Eating a diet high in saturated fats.  Being exposed to high stress or triggers of stress.  Using drugs, such as cocaine. Women have a greater risk for angina if:  They are older than 25.  They have gone through menopause (are postmenopausal). What are the signs or symptoms? Common symptoms of this condition in both men and women may include:  Chest pain, which may: ? Feel like a crushing or squeezing in the chest, or like a tightness, pressure, fullness, or heaviness in the chest. ? Last for more than a few minutes at a time, or it may stop and come back (recur) over the course of a few minutes.  Pain in the neck, arm, jaw, or back.  Unexplained heartburn or indigestion.  Shortness of breath.  Nausea.  Sudden cold sweats. Women and people with diabetes may have unusual (atypical) symptoms, such as:  Fatigue.  Unexplained feelings of nervousness or anxiety.  Unexplained weakness.  Dizziness or fainting. How is this diagnosed? This condition may be diagnosed based on:  Your symptoms and medical history.  Electrocardiogram (ECG) to measure the electrical activity in your heart.  Blood tests.  Stress test to look for signs of blockage when your heart is stressed.  CT angiogram to examine your heart and the blood flow to it.  Coronary angiogram to check your coronary arteries for blockage. How is this treated? Angina may be treated with:  Medicines to: ? Prevent blood clots and heart attack. ? Relax  blood vessels and improve blood flow to the heart (nitrates). ? Reduce blood pressure, improve the pumping action of the heart, and relax blood vessels that are spasming. ? Reduce cholesterol and help treat atherosclerosis.  A procedure to widen a narrowed or blocked coronary artery (angioplasty). A mesh tube may be placed in a coronary artery to keep it open (coronary stenting).  Surgery to allow blood to go around a  blocked artery (coronary artery bypass surgery). Follow these instructions at home: Medicines  Take over-the-counter and prescription medicines only as told by your health care provider.  Do not take the following medicines unless your health care provider approves: ? NSAIDs, such as ibuprofen or naproxen. ? Vitamin supplements that contain vitamin A, vitamin E, or both. ? Hormone replacement therapy that contains estrogen with or without progestin. Eating and drinking   Eat a heart-healthy diet. This includes plenty of fresh fruits and vegetables, whole grains, low-fat (lean) protein, and low-fat dairy products.  Follow instructions from your health care provider about eating or drinking restrictions. Activity  Follow an exercise program approved by your health care provider.  Consider joining a cardiac rehabilitation program.  Take a break when you feel fatigued. Plan rest periods in your daily activities. Lifestyle   Do not use any products that contain nicotine or tobacco, such as cigarettes, e-cigarettes, and chewing tobacco. If you need help quitting, ask your health care provider.  If your health care provider says you can drink alcohol: ? Limit how much you use to:  0-1 drink a day for nonpregnant women.  0-2 drinks a day for men. ? Be aware of how much alcohol is in your drink. In the [place] one drink equals one 12 oz bottle of beer (355 mL), one 5 oz glass of wine (148 mL), or one 1 oz glass of hard liquor (44 mL). General instructions  Maintain a healthy weight.  Learn to manage stress.  Keep your vaccinations up to date. Get the flu (influenza) vaccine every year.  Talk to your health care provider if you feel depressed. Take a depression screening test to see if you are at risk for depression.  Work with your health care provider to manage other health conditions, such as hypertension or diabetes.  Keep all follow-up visits as told by your health care  provider. This is important. Get help right away if:  You have pain in your chest, neck, arm, jaw, or back, and the pain: ? Lasts more than a few minutes. ? Is recurring. ? Is not relieved by taking medicines under the tongue (sublingual nitroglycerin). ? Increases in intensity or frequency.  You have a lot of sweating without cause.  You have unexplained: ? Heartburn or indigestion. ? Shortness of breath or difficulty breathing. ? Nausea or vomiting. ? Fatigue. ? Feelings of nervousness or anxiety. ? Weakness.  You have sudden light-headedness or dizziness.  You faint. These symptoms may represent a serious problem that is an emergency. Do not wait to see if the symptoms will go away. Get medical help right away. Call your local emergency services (911 in the [place] Do not drive yourself to the hospital. Summary  Angina is extreme discomfort in the chest, neck, arm, jaw, or back that is caused by a lack of blood in the heart wall.  There are many symptoms of angina. They include chest pain, unexplained heartburn or indigestion, sudden cold sweats, and fatigue.  Angina may be treated with behavioral changes, medicine, or surgery.  Symptoms of angina may represent an emergency. Get medical help right away. Call your local emergency services (911 in the [place] Do not drive yourself to the hospital. This information is not intended to replace advice given to you by your health care provider. Make sure you discuss any questions you have with your health care provider. Document Revised: 01/28/2018 Document Reviewed: 01/28/2018 Elsevier Patient Education  [place].

## 2019-10-21 NOTE — Progress Notes (Signed)
Impression and Recommendations:    1. Surgical counseling visit- NOT surgically cleared today.    2. Exertional chest pressure/tightness with activity, relieved with rest   3. h/o Dyspnea on exertion- worsening      4. Essential hypertension- poorly controlled even at home     5. History of smoking 25-50 pack years- quit age 69, smoked 2ppd from 69 yo-69 yo.   6. History of exposure to asbestos     7. Family history of adverse reaction to anesthesia   8. History of surgery- pt has several Sx- no personal issues with anesthesia      9. h/o Thrombocytopenia (HCC)     10. h/o Stage 3 chronic kidney disease, unspecified whether stage 3a or 3b CKD     11. Class 1 obesity with serious comorbidity in adult, unspecified BMI, unspecified obesity type   12. Sleep disturbance- wakes up from sleep feeling SOB      - Of note, this is my first time meeting patient.  Patient is new to me and was previously being cared for at our office by an NP, who no longer works at [facility].   Will be seen by Lorrene Reid, PA-C in future.   Surgical counseling visit-  - d/c pt that due to her current sx- we do not/ cannot medically clear her for sx at this time due to concerning cardio-pulm symptoms and poorly controlled chronic conditions ( BP).    Poorly controlled BP - Initially blood pressure today was 165/79 with a heart rate of 103.   Repeat blood pressure reported at 144/85 with heart rate of 98 after sitting 20+ minutes - Bp reportedly poorly controlled at home as well - Pt will need CV w/up by Cards and further recs from them re: BP mgt at this time    Family history of adverse reaction to anesthesia. - Patient denies personal history of adverse reaction to anesthesia. -She has had surgery several times in past herself without any side effects.    Current c/o chest pain- pt reports history of this being exertional in nature and relieved with rest, -  also  reports increasing Dyspnea on exertion   - Sees and managed by Cardiology:  is being worked up for these sx now by them.  -  PT understands she will need to medically cleared for sx by them in future due to these sx  - I personally reviewed cardiac studies from 5 and 6 of 2019. Both very reassuring  - Patient is scheduled for CV imaging on 10/24/2019.  - Advised patient of my significant concerns about her symptoms.    She will need to be evaluated by her cardiologist for increasing dyspnea on exertion, and exertional chest pain that is relieved with rest.  Discussed need to be evaluated and have further work-up to ensure this is not due to cardiovascular issues.  - Advised patient to have a discussion with her cardiologist regarding her exertional chest pain/pressure and dyspnea on exertion.  - Discussed red flag symptoms with patient today and advised patient to call cards if she dev any exertional CP not relieved with NTG and rest. Explained with these red flag sx and may need to proceed to ED!   Further Pre-Sx recs:  - Recommended re-check CBC to review patient's platelets the day before or morning of surgery, to ensure that they are measuring at a level that is safe to proceed with surgery.  Explained to patient this will be the call of her surgeon but usually they want >er 100K or so  - Recommended she get a re-check of renal function and electrolytes the day before or morning of surgery due to h/o CKD. Recently serum crt- stable  - Cardiology to advise patient regarding use of anticoagulants.  - Discussed critical need for patient to be in optimal health prior to all ELECTIVE surgical procedures.   Thrombocytopenia - Last checked on 09/17/2019. - Platelets at that time were 137, down from 166 four months ago. - Discussed need to continue to monitor.    Recommendations - Discussed she also may need to be evaluated for OSA at future chronic care ov's.  Rec Epworth sleepiness  scale Q's be administered and referral to sleep med made if appropriate.  - Also, given her 30 pack-year history of smoking, and also asbestos exposure at work for many years at future appointment we may need to entertain COPD work-up with PFTs etc. if cardiovascular work-up negative.  Will continue to follow/ monitor  - As part of my medical decision making, I reviewed the following data within the [place] History obtained from pt, CMA notes reviewed and incorporated if applicable  - Labs reviewed, Radiograph/ tests reviewed if applicable and OV notes from prior OV's with me, as well as other specialists she/he has seen since seeing me last, were all reviewed and used in my medical decision making process today.    - Additionally, when appropriate, discussion had with patient regarding our treatment plan, and their biases/concerns about that plan were used in my medical decision making today.    - The patient agreed with the plan and demonstrated an understanding of the instructions.   No barriers to understanding were identified.   - The patient was advised to call her Cardiology team or seek an in-person ED eval if symptoms worsen or become unstable    Please see AVS handed out to patient at the end of our visit for further patient instructions/ counseling done pertaining to today's office visit.   Return for f/up in 1-2 months for chronic care.     Note:  This note was prepared with assistance of Dragon voice recognition software. Occasional wrong-word or sound-a-like substitutions may have occurred due to the inherent limitations of voice recognition software.  The [place] was signed into law in 2016 which includes the topic of electronic health records.  This provides immediate access to information in MyChart.  This includes consultation notes, operative notes, office notes, lab results and pathology reports.  If you have any questions about what you  read please let us know at your next visit or call us at the office.  We are right here with you.   This case required medical decision making of at least very significant medical complexity.   This document serves as a record of services personally performed by Mellody Dance, DO. It was created on her behalf by Toni Amend, a trained medical scribe. The creation of this record is based on the scribe's personal observations and the provider's statements to them.   The above documentation from Toni Amend, medical scribe, has been reviewed by Marjory Sneddon, D.O.      ------------------------------------------------------------------------------------------------------------------------------------------------------------------------------------------------------------------------------------------    Subjective:     Phillips Odor, am serving as scribe for Dr.Alphus Zeck.   HPI: Amy Moon is a 69 y.o. female who presents to [facility] at  [facility] today for surgical clearance.  - Sleep Says sometimes she wakes up in the middle of the night gasping for breath while sleeping.  Notes she had a sleep study years and yrs ago.  Says that her sleep difficulties are not recent, but she knows it may be taxing her health.  Says she feels lucky if she gets four hours of sleep.  "I feel tired, but I can't get to sleep."   - COVID-19 Vaccination Status She has not obtained her COVID-19 vaccination yet.  Says she has difficulty getting out and doing things because she is watching her grand grandchild during the day, who is special needs.   - OV TODAY because Need for Surgical Clearance She is having a surgical procedure on her neck.  Believes it will be a cervical fusion with anterior approach.    When asked if she has Parkinson's Disease, she says "I say no."  Notes "I don't take anything for it, no," and "they said it could be one of the  slow-moving type Parkinson's, and it's better to have it on there now than be surprised."  Has seen a neurologist for this in the past.  Sees Dr. Lyndel Safe for her GI concerns. Currently no concerns   Sees Dr. Agustin Cree of Cardiology, and notes she has an echocardiogram scheduled for the 30th.   --> Last saw them on 10/17/2019 for DOE and Chest pain concerns.   - Blood Pressure, History of Hypertension  Says that her blood pressure "goes up and down." - Patient denies a history of hypertension.  Denies being on blood pressure medications in the past.  Says regarding concerns of CHF in the past, she's experienced swelling in her legs, SOB, and a sensation of "my lungs are fighting to breathe."  She is being monitored for this by cardiology.  Thinks her highest blood pressure at home was 180/100-something.  When she checks her blood pressure at home, notes "it's usually always a little higher, around 160 or so over 80-90."  Top number runs from 160-180, and says "bottom is usually 60-70."  She has told her cardiologist this information, and notes "that's why these tests are starting again with him."  She can always tell when her blood pressure is high, "because I get a headache behind my eyes."  Says "I can usually tell, if that pressure and pain starts in my eyes, it's going to be up."  Has never been on blood pressure medications per pt.  - Dyspnea on Exertion & Cardiac Health Says she doesn't think she's had trouble catching her breath "except for the past year, maybe."  Patient does not believe she's had an angiogram or cardiac catheterization.   - Patient says she's had an echocardiogram, and something with "dye in her veins that simulates exertion, and they watch the heart and all that."  Confirms this was a chemical stress test.  Began having the chest pain at least 2-3 years ago.  The chest pain is "getting worse sometimes"  Her shortness of breath has become a little worse recently.   Feels it's closely related to the chest pain/pressure.     Says her husband walks fast, and she can't keep pace with him.  When she tries to walk as fast as her husband, she experiences shortness of breath along with "not exactly pain, just a pressure" type of feeling if she walks faster.  Says she experiences these symptoms "sometimes just moving about."  Notes "I don't like  sitting still, it's hard for me to sit still."  Sometimes if she gets busy doing something, rushing around, she experiences symptoms.  Does not experience symptoms while sitting quietly.  Says sometimes when she stands up, she also briefly experiences symptoms of SOB, chest tightness.   Denies chest pain, tightness, SOB while sitting quietly in office today.   States that she has chest tightness with exertion, and denies these symptoms while sitting quietly.  Says when she relaxes after experiencing symptoms, the symptoms go away.  The symptoms return when she stands up and starts moving around a bit.   She takes nitroglycerine time to time.  Feels she doesn't need it often, but does take it, and notes "it helps at times."  She is unable to climb a flight of stairs without chest tightness plus SOB.  Says she usually has to stop halfway up the stairs.  - History of Smoking Smoked a long time ago and quit smoking around age 47.  She smoked about two packs per day at the time, for about 15 years.  She has a 30 pack-year history of smoking.  - Family History of Adverse Reaction to Anesthesia In terms of family history of adverse to anesthesia, notes they had a hard time getting her father to wake up.  Notes that his breathing was very shallow, and he has never done well with anesthesia.  She has had anesthesia in the past, for several surgeries, including bleeding ulcer, hysterectomy, removal of a tumor.  Denies personal issues with tolerating anesthesia.    - Cardiology Visit 10/17/2019 History of Present Illness:     DECKER MIGGINS is a 69 y.o. female with past medical history significant for chest pain.  She did have a stress test done in summer 2019 which was low risk, after that she was treated with medical therapy with good response.  She has been also complaining of having dyspnea on exertion and that sensation became worse lately.  Past medical history also significant for chronic kidney disease, dyslipidemia.  She comes today to my office for follow-up leading complaint this time is shortness of breath.  She said for last 3 to 4 months he is getting easily short of breath while walking.  She also described to have some swelling of lower extremities is happening at the meantime.  I described the fact that she had difficulty falling asleep but wakes up many times during the night.  Sometimes she thinks she got shortness of breath at night when she wakes up.  Still described to have chest pain on and off not related to exercise usually.   Plan from Cardiology on 10/17/2019: PLAN:    In order of problems listed above:  1. Dyspnea on exertion obviously concerning.  I will ask him to have another echocardiogram done to assess left ventricle ejection fraction, will also do proBNP as well as Chem-7 with anticipation of potential treatment. 2. Essential hypertension.  Blood pressure seems to be well controlled continue present management. 3. Precordial pain still present but stable.  I will continue present management which include ranolazine as well as nitroglycerin as needed.  She did not have the use of ranolazine lately.  We will continue with Plavix since she is allergic to aspirin.    Wt Readings from Last 3 Encounters:  10/21/19 169 lb 1.6 oz (76.7 kg)  10/17/19 162 lb (73.5 kg)  10/09/19 160 lb (72.6 kg)   BP Readings from Last 3 Encounters:  10/21/19 Marland Kitchen)  144/85  10/17/19 128/70  10/09/19 (!) 152/83   Pulse Readings from Last 3 Encounters:  10/21/19 98  10/17/19 96  10/09/19 (!) 102   BMI  Readings from Last 3 Encounters:  10/21/19 33.03 kg/m  10/17/19 31.64 kg/m  10/09/19 31.25 kg/m     Patient Care Team    Relationship Specialty Notifications Start End  Patient, No Pcp Per PCP - General General Practice  08/28/19   Park Liter, MD PCP - Cardiology Cardiology Admissions 07/01/18   Jackquline Denmark, MD Consulting Physician Gastroenterology  05/09/18      Patient Active Problem List   Diagnosis Date Noted  . Stage 3 chronic kidney disease 10/21/2019  . History of smoking 25-50 pack years- quit age 18, smoked 2ppd from 69 yo-69 yo. 10/21/2019  . History of surgery- pt has several Sx- no personal issues wiht anesthesia  10/21/2019  . Sleep disturbance 10/21/2019  . History of exposure to asbestos 10/21/2019  . Family history of adverse reaction to anesthesia   . Dyspnea on exertion 10/17/2019  . Bronchitis 06/03/2018  . Encounter for Medicare annual wellness exam 05/09/2018  . Vitamin D deficiency 11/05/2017  . Encounter for hepatitis C virus screening test for high risk patient 11/05/2017  . Dysphagia 09/03/2017  . Thrombocytopenia ([place]) 09/03/2017  . Healthcare maintenance 07/30/2017  . Chronic pain syndrome 07/30/2017  . Chest pain 07/30/2017  . Parkinson's disease ([place]) 07/30/2017  . Hypertension 07/30/2017  . Severe obesity (BMI >= 40) ([place]) 05/15/2016  . Severe episode of recurrent major depressive disorder, without psychotic features ([place]) 05/15/2016  . Tremor of both hands 05/15/2016  . Nausea 03/01/2016  . Irritant contact dermatitis 08/25/2015    Past Medical history, Surgical history, Family history, Social history, Allergies and Medications have been entered into the medical record, reviewed and changed as needed.    Current Meds  Medication Sig  . acetaminophen (TYLENOL) 650 MG CR tablet Take 650 mg by mouth every 6 (six) hours as needed for pain.  . bisacodyl (DULCOLAX) 5 MG EC tablet Take 15 mg by mouth at bedtime as needed for moderate  constipation.  . clopidogrel (PLAVIX) 75 MG tablet Take 1 tablet (75 mg total) by mouth daily.  . cyclobenzaprine (FLEXERIL) 10 MG tablet TAKE 1 TABLET BY MOUTH EVERY 8 HOURS AS NEEDED  . diphenhydrAMINE (BENADRYL) 25 MG tablet Take 25 mg by mouth every 6 (six) hours as needed.  . gabapentin (NEURONTIN) 300 MG capsule Take 2 capsules (600 mg total) by mouth at bedtime AND 1 capsule (300 mg total) 2 (two) times daily.  Marland Kitchen HYDROcodone-acetaminophen (NORCO/VICODIN) 5-325 MG tablet TAKE 1 TABLET BY MOUTH EVERY 6 HOURS AS NEEDED FOR MODERATE PAIN.  . nitroGLYCERIN (NITROSTAT) 0.4 MG SL tablet Place 0.4 mg under the tongue every 5 (five) minutes as needed for chest pain.   . pantoprazole (PROTONIX) 40 MG tablet Take 1 tablet (40 mg total) by mouth daily.  . promethazine (PHENERGAN) 25 MG tablet Take 1 tablet (25 mg total) by mouth every 8 (eight) hours as needed for nausea or vomiting.  . ranolazine (RANEXA) 500 MG 12 hr tablet Take 1 tablet (500 mg total) by mouth 2 (two) times daily.  . Vitamin D, Ergocalciferol, (DRISDOL) 1.25 MG (50000 UT) CAPS capsule Take 1 capsule (50,000 Units total) by mouth every 7 (seven) days. (Patient taking differently: Take 50,000 Units by mouth every 7 (seven) days. On Wednesday)   Current Facility-Administered Medications for the 10/21/19 encounter (Office Visit) with  Mellody Dance, DO  Medication  . bupivacaine (MARCAINE) 0.25 % (with pres) injection 2 mL    Allergies:  Allergies  Allergen Reactions  . Iodinated Diagnostic Agents Hives  . Nsaids Other (See Comments)    History of bleeding ulcers  . Latex Hives and Rash  . Sinemet [Carbidopa W-Levodopa] Nausea And Vomiting  . Inderal [Propranolol] Other (See Comments)    Unknown  . Indomethacin Other (See Comments)    Unknown  . Penicillin G Other (See Comments)    Unknown  . Pneumococcal Vaccines Other (See Comments)    Caused "pneumonia"  . Scopolamine Other (See Comments)    The patch, caused her to  pass out / change in mental status  . Tape Other (See Comments)    Unknown     Review of Systems:  A fourteen system review of systems was performed and found to be positive as per HPI.   Objective:   Blood pressure (!) 144/85, pulse 98, temperature (!) 97.4 F (36.3 C), temperature source Oral, height 5' (1.524 m), weight 169 lb 1.6 oz (76.7 kg), SpO2 98 %. Body mass index is 33.03 kg/m. General:  Well Developed, well nourished, appropriate for stated age.  Neuro:  Alert and oriented,  extra-ocular muscles intact  HEENT:  Normocephalic, atraumatic, neck supple, no carotid bruits appreciated  Skin:  no gross rash, warm, pink. Cardiac:  RRR, S1 S2 Respiratory:  ECTA B/L and A/P, Not using accessory muscles, speaking in full sentences- unlabored. Vascular:  Ext warm, no cyanosis apprec.; cap RF less 2 sec. Psych:  No HI/SI, judgement and insight good, Euthymic mood. Full Affect. Lower Extremities:  mild, nonpitting 1+ edema

## 2019-10-24 ENCOUNTER — Other Ambulatory Visit: Payer: Self-pay

## 2019-10-24 ENCOUNTER — Ambulatory Visit (HOSPITAL_BASED_OUTPATIENT_CLINIC_OR_DEPARTMENT_OTHER)
Admission: RE | Admit: 2019-10-24 | Discharge: 2019-10-24 | Disposition: A | Discharge status: Home / Self Care | Payer: Medicare Other | Source: Ambulatory Visit | Attending: Cardiology | Admitting: Cardiology

## 2019-10-24 DIAGNOSIS — R0609 Other forms of dyspnea: Secondary | ICD-10-CM

## 2019-10-24 DIAGNOSIS — R0600 Dyspnea, unspecified: Secondary | ICD-10-CM

## 2019-10-24 DIAGNOSIS — R072 Precordial pain: Secondary | ICD-10-CM | POA: Insufficient documentation

## 2019-10-24 DIAGNOSIS — I10 Essential (primary) hypertension: Secondary | ICD-10-CM | POA: Diagnosis not present

## 2019-10-24 NOTE — Progress Notes (Signed)
  Echocardiogram 2D Echocardiogram has been performed.  Amy Moon 10/24/2019, 1:56 PM

## 2019-10-27 ENCOUNTER — Other Ambulatory Visit: Payer: Self-pay | Admitting: Specialist

## 2019-10-31 ENCOUNTER — Ambulatory Visit: Payer: Medicare Other | Admitting: Specialist

## 2019-11-04 ENCOUNTER — Other Ambulatory Visit: Payer: Self-pay | Admitting: Gastroenterology

## 2019-11-04 DIAGNOSIS — R1011 Right upper quadrant pain: Secondary | ICD-10-CM

## 2019-11-04 DIAGNOSIS — R110 Nausea: Secondary | ICD-10-CM

## 2019-11-06 ENCOUNTER — Other Ambulatory Visit: Payer: Self-pay | Admitting: Specialist

## 2019-11-06 ENCOUNTER — Other Ambulatory Visit: Payer: Self-pay | Admitting: Gastroenterology

## 2019-11-06 DIAGNOSIS — R110 Nausea: Secondary | ICD-10-CM

## 2019-11-07 ENCOUNTER — Other Ambulatory Visit: Payer: Self-pay | Admitting: Specialist

## 2019-11-07 NOTE — Telephone Encounter (Signed)
Is it ok to refill phenergan for this patient?

## 2019-11-13 ENCOUNTER — Telehealth: Payer: Self-pay | Admitting: Cardiology

## 2019-11-13 NOTE — Telephone Encounter (Signed)
  Went to chart to check who called pt

## 2019-11-14 ENCOUNTER — Ambulatory Visit: Payer: Medicare Other | Admitting: Cardiology

## 2019-11-17 ENCOUNTER — Other Ambulatory Visit: Payer: Self-pay | Admitting: Specialist

## 2019-11-25 ENCOUNTER — Other Ambulatory Visit: Payer: Self-pay | Admitting: Specialist

## 2019-11-26 ENCOUNTER — Ambulatory Visit (INDEPENDENT_AMBULATORY_CARE_PROVIDER_SITE_OTHER): Payer: Medicare Other | Admitting: Gastroenterology

## 2019-11-26 ENCOUNTER — Telehealth: Payer: Self-pay

## 2019-11-26 ENCOUNTER — Encounter: Payer: Self-pay | Admitting: Gastroenterology

## 2019-11-26 ENCOUNTER — Other Ambulatory Visit: Payer: Self-pay

## 2019-11-26 VITALS — Ht 60.0 in | Wt 155.0 lb

## 2019-11-26 DIAGNOSIS — K219 Gastro-esophageal reflux disease without esophagitis: Secondary | ICD-10-CM

## 2019-11-26 DIAGNOSIS — R1011 Right upper quadrant pain: Secondary | ICD-10-CM | POA: Diagnosis not present

## 2019-11-26 DIAGNOSIS — R1310 Dysphagia, unspecified: Secondary | ICD-10-CM | POA: Diagnosis not present

## 2019-11-26 DIAGNOSIS — K5909 Other constipation: Secondary | ICD-10-CM

## 2019-11-26 DIAGNOSIS — Z1211 Encounter for screening for malignant neoplasm of colon: Secondary | ICD-10-CM | POA: Diagnosis not present

## 2019-11-26 DIAGNOSIS — K7469 Other cirrhosis of liver: Secondary | ICD-10-CM

## 2019-11-26 DIAGNOSIS — R112 Nausea with vomiting, unspecified: Secondary | ICD-10-CM | POA: Diagnosis not present

## 2019-11-26 DIAGNOSIS — Z1212 Encounter for screening for malignant neoplasm of rectum: Secondary | ICD-10-CM

## 2019-11-26 MED ORDER — FLUTICASONE PROPIONATE 0.05 % EX CREA: TOPICAL_CREAM | Freq: Two times a day (BID) | CUTANEOUS | 2 refills | Status: DC

## 2019-11-26 MED ORDER — PREDNISONE 50 MG PO TABS
ORAL_TABLET | ORAL | 0 refills | Status: DC
Start: 2019-11-26 — End: 2020-02-23

## 2019-11-26 MED ORDER — DIPHENHYDRAMINE HCL 25 MG PO TABS: ORAL_TABLET | ORAL | 0 refills | Status: DC

## 2019-11-26 NOTE — Progress Notes (Signed)
IMPRESSION and PLAN:    #1. Nausea with RUQ pain. Failed zofran. Better with phenergan. US showed gall stone 08/2019, neg HIDA for acute cholecystitis  #2. GERD with dysphagia (resolved after dil 06/2019) d/t distal eso stricture, 2 cm HH. Neg Bx for EoE. H/O food impaction 2003 s/p endoscopic disimpaction. Has component of oropharyngeal dysphagia d/t ?  Parkinson's.  Had PEJ 2012 (removed 2017).   #3. Colorectal cancer screening: Colonoscopy 06/2019: 6 mm TA s/p polypectomy, mild sig div, highly redundant colon.  Next colon due 06/2026.  #4. Cryptogenic early liver cirrhosis (Dx on [place] 02/2018, alb 4.6 2019) with borderline splenomegaly with borderline platelet count. Likely d/t NASH. No ETOH, neg Hep C. No varices on EGD 06/2019 or ascites on [place] 02/2018  #6. Chronic constipation. Excerberated be pain meds. Failed miralax, colace. Better with 3 dulcolax/day. Neg colon 06/2019. Occ rectal bleeding d/t hoids.  #7. Asymptomatic cholelithiasis. Neg HIDA 09/2019 for acute cholecystitis.   Plan: -Continue Protonix 40mg  po qd. -Continue Phenergan. Sedation and fall precautions were discussed. -CT abdo/pelvis with PO/IV contrast.  -If still with problems, solid-phase GES. -Benefiber 1 TBS p.o. QD with 8 oz of water. -fluticasone cream 0.05% generic 30g 1 bid PR x 10 days, 2 refills. -Minimize pain medications. -FU in 12 weeks in person.  HPI:    Chief Complaint:   Amy Moon is a 69 y.o. female  For follow-up visit. She could not come in for in person visit today  Continues to have right upper quadrant abdominal pain radiating to the back with nausea but no vomiting.  Having neck problems and is diagnosed as having cervical radiculopathy.  She is currently on hydrocodone.  She may require neck surgery.  Underwent right upper quad ultrasound which showed gallstone.  She had positive Murphy's sign endoscopically.  Thereafter underwent HIDA scan which was negative for  cholecystitis.  Nausea is better with Phenergan.  No jaundice dark urine or pale stools.  No fever chills or night sweats.   Her constipation did get worse but is currently better with Dulcolax.  She would occasionally have rectal bleeding attributed to hemorrhoids.  Underwent EGD and colonoscopy January 2021  EGD: 07/16/2019 distal esophageal stricture s/p dilatation, grade a esophagitis, small hiatal hernia. Colonoscopy January/2021 6 mm colonic polyp SP polypectomy, mild sigmoid diverticulosis.  Otherwise normal to TI.  Biopsies showed tubular adenoma.  Repeat colonoscopy in 7 years.   Past Medical History:  Diagnosis Date  . Angina pectoris ([place])   . Chronic kidney disease   . Drug-induced low platelet count   . Duodenal ulcer   . Granulomatous disease ([place])   . Hepatic disease   . Hypertension   . Low vitamin D level   . Neuropathy   . Osteopenia   . Parkinson's disease ([place])       . Splenomegaly a    Current Outpatient Medications  Medication Sig Dispense Refill  . acetaminophen (TYLENOL) 650 MG CR tablet Take 650 mg by mouth every 6 (six) hours as needed for pain.    . bisacodyl (DULCOLAX) 5 MG EC tablet Take 15 mg by mouth at bedtime as needed for moderate constipation.    . clopidogrel (PLAVIX) 75 MG tablet Take 1 tablet (75 mg total) by mouth daily. 90 tablet 2  . cyclobenzaprine (FLEXERIL) 10 MG tablet TAKE 1 TABLET BY MOUTH EVERY 8 HOURS AS NEEDED 30 tablet 0  . diphenhydrAMINE (BENADRYL) 25 MG tablet Take 25 mg by  mouth every 6 (six) hours as needed.    . gabapentin (NEURONTIN) 300 MG capsule Take 2 capsules (600 mg total) by mouth at bedtime AND 1 capsule (300 mg total) 2 (two) times daily. 120 capsule 6  . HYDROcodone-acetaminophen (NORCO/VICODIN) 5-325 MG tablet TAKE 1 TABLET BY MOUTH EVERY 6 HOURS AS NEEDED FOR MODERATE PAIN. 30 tablet 0  . pantoprazole (PROTONIX) 40 MG tablet Take 1 tablet (40 mg total) by mouth daily. 30 tablet 6  . promethazine (PHENERGAN) 25  MG tablet TAKE 1 TABLET BY MOUTH EVERY 6 HOURS AS NEEDED FOR NAUSEA OR VOMITING. 30 tablet 2  . ranolazine (RANEXA) 500 MG 12 hr tablet Take 1 tablet (500 mg total) by mouth 2 (two) times daily. 180 tablet 2  . nitroGLYCERIN (NITROSTAT) 0.4 MG SL tablet Place 0.4 mg under the tongue every 5 (five) minutes as needed for chest pain.     . Vitamin D, Ergocalciferol, (DRISDOL) 1.25 MG (50000 UT) CAPS capsule Take 1 capsule (50,000 Units total) by mouth every 7 (seven) days. (Patient not taking: Reported on 11/26/2019) 16 capsule 0   Current Facility-Administered Medications  Medication Dose Route Frequency Provider Last Rate Last Admin  . bupivacaine (MARCAINE) 0.25 % (with pres) injection 2 mL  2 mL Other Once Magnus Sinning, MD        Past Surgical History:  Procedure Laterality Date  . ABDOMINAL HYSTERECTOMY    . APPENDECTOMY    . BACK SURGERY     thoracic-fractured   . BACK SURGERY     lumbar - fractured  . BREAST LUMPECTOMY WITH RADIOACTIVE SEED LOCALIZATION Right 08/01/2018   Procedure: RADIOACTIVE SEED GUIDED RIGHT BREAST LUMPECTOMY;  Surgeon: Coralie Keens, MD;  Location: [facility];  Service: General;  Laterality: Right;  . ESOPHAGOGASTRODUODENOSCOPY  05/20/2008   Mild to moderate esophagitis. Otherwise normal EGD.   Marland Kitchen EYE SURGERY Left    cataract surgery with lens implant  . GASTROSTOMY     peptic ulcer - peg tube placement    Family History  Problem Relation Age of Onset  . Cancer Mother        ovarian  . Hyperlipidemia Mother   . Alzheimer's disease Mother   . Arthritis Mother        rheumatoid  . Cancer Father        lung  . Parkinson's disease Father   . Alzheimer's disease Father   . ALS Sister   . Cancer Maternal Aunt        pancreatic, lung, liver,breast  . Breast cancer Maternal Aunt   . Cancer Maternal Uncle        pancreatic, liver, brain  . Colon cancer Maternal Uncle   . Heart attack Paternal Aunt   . Stroke Paternal Aunt   . Stomach cancer Paternal  Aunt   . Heart attack Paternal Uncle   . Stroke Paternal Grandmother   . Heart attack Paternal Grandfather   . Arthritis Sister        rheumatoid  . Huntington's disease Daughter        presumed inherited from father per patient  . Colon cancer Maternal Uncle   . Pancreatic cancer Maternal Uncle   . Liver disease Maternal Uncle   . Breast cancer Maternal Aunt   . Breast cancer Maternal Aunt   . Esophageal cancer Neg Hx   . Rectal cancer Neg Hx     Social History   Tobacco Use  . Smoking status: Former Smoker  Packs/day: 1.00    Years: 22.00    Pack years: 22.00    Types: Cigarettes    Quit date: 06/26/1986    Years since quitting: 33.4  . Smokeless tobacco: Never Used  Substance Use Topics  . Alcohol use: No  . Drug use: No    Allergies  Allergen Reactions  . Iodinated Diagnostic Agents Hives  . Nsaids Other (See Comments)    History of bleeding ulcers  . Latex Hives and Rash  . Sinemet [Carbidopa W-Levodopa] Nausea And Vomiting  . Inderal [Propranolol] Other (See Comments)    Unknown  . Indomethacin Other (See Comments)    Unknown  . Penicillin G Other (See Comments)    Unknown  . Pneumococcal Vaccines Other (See Comments)    Caused "pneumonia"  . Scopolamine Other (See Comments)    The patch, caused her to pass out / change in mental status  . Tape Other (See Comments)    Unknown     Review of Systems: All systems reviewed and negative except where noted in HPI.    Physical Exam:     Ht 5' (1.524 m)   Wt 155 lb (70.3 kg)   BMI 30.27 kg/m  Not examined since it was a televisit.  . CBC Latest Ref Rng & Units 09/17/2019 06/18/2019 07/25/2018  WBC 4.0 - 10.5 K/uL 4.9 5.4 4.8  Hemoglobin 12.0 - 15.0 g/dL 13.0 13.9 12.5  Hematocrit 36.0 - 46.0 % 38.2 39.5 37.6  Platelets 150.0 - 400.0 K/uL 137.0(L) 166 122(L)   CMP Latest Ref Rng & Units 10/17/2019 09/17/2019 06/18/2019  Glucose 65 - 99 mg/dL 85 88 98  BUN 8 - 27 mg/dL 11 11 9   Creatinine 0.57 -  1.00 mg/dL 0.76 0.67 0.79  Sodium 134 - 144 mmol/L 139 139 141  Potassium 3.5 - 5.2 mmol/L 3.9 3.7 3.6  Chloride 96 - 106 mmol/L 104 103 102  CO2 20 - 29 mmol/L 25 30 24   Calcium 8.7 - 10.3 mg/dL 9.5 9.4 9.4  Total Protein 6.0 - 8.3 g/dL - 7.2 7.0  Total Bilirubin 0.2 - 1.2 mg/dL - 1.0 0.9  Alkaline Phos 39 - 117 U/L - 80 89  AST 0 - 37 U/L - 13 17  ALT 0 - 35 U/L - 12 12   I connected with  Amy Moon on 11/26/19 by a phone visit (video-did not work due to Internet connection) and verified that I am speaking with the correct person using two identifiers.   I discussed the limitations of evaluation and management by telemedicine. The patient expressed understanding and agreed to proceed.  25 min-including coordination of care     Krystiana Fornes,MD 11/26/2019, 10:53 AM   CC No ref. provider found

## 2019-11-26 NOTE — Patient Instructions (Addendum)
If you are age 71 or older, your body mass index should be between 23-30. Your Body mass index is 30.27 kg/m. If this is out of the aforementioned range listed, please consider follow up with your Primary Care Provider.  If you are age 25 or younger, your body mass index should be between 19-25. Your Body mass index is 30.27 kg/m. If this is out of the aformentioned range listed, please consider follow up with your Primary Care Provider.   Continue Protonix 40 mg daily. Continue Phenergan.  Sedation and fall precautions were discussed.  Minimize pain medications.  Start Benefiber 1 tablespoon daily with 8 ounces of water.  We have sent the following medications to your pharmacy for you to pick up at your convenience: Fluticasone cream - use twice daily for 10 days Prednisone 50 mg - take as directed. Benadryl 25 mg - take as directed  Your provider has requested that you go to the basement level for lab work at [facility]. [place], [place]. Press "B" on the elevator. The lab is located at the first door on the left as you exit the elevator. YOU WILL NEED TO HAVE [facility] DRAWN THIS WEEK.  PICK UP Contrast before day of san. [facility])  You have been scheduled for a CT scan of the abdomen and pelvis at [facility]. You are scheduled on 12/04/19 at 9 am. You should arrive 15 minutes prior to your appointment time for registration. Please follow the written instructions below on the day of your exam:  WARNING: IF YOU ARE ALLERGIC TO IODINE/X-RAY DYE, PLEASE NOTIFY RADIOLOGY IMMEDIATELY AT [phone]! YOU WILL BE GIVEN A 13 HOUR PREMEDICATION PREP.  1) Do not eat or drink anything after 5 am (4 hours prior to your test) 2) You have been given 2 bottles of oral contrast to drink. The solution may taste better if refrigerated, but do NOT add ice or any other liquid to this solution. Shake well before drinking.    Drink 1 bottle of contrast @ 7 am (2 hours  prior to your exam)  Drink 1 bottle of contrast @ 8 am (1 hour prior to your exam)  You may take any medications as prescribed with a small amount of water, if necessary. If you take any of the following medications: METFORMIN, GLUCOPHAGE, GLUCOVANCE, AVANDAMET, RIOMET, FORTAMET, [place] MET, JANUMET, GLUMETZA or METAGLIP, you MAY be asked to HOLD this medication 48 hours AFTER the exam.  The purpose of you drinking the oral contrast is to aid in the visualization of your intestinal tract. The contrast solution may cause some diarrhea. Depending on your individual set of symptoms, you may also receive an intravenous injection of x-ray contrast/dye. Plan on being at [facility] for 30 minutes or longer, depending on the type of exam you are having performed.  This test typically takes 30-45 minutes to complete.  If you have any questions regarding your exam or if you need to reschedule, you may call the CT department at [phone]  between the hours of 8:00 am and 5:00 pm, Monday-Friday.  ________________________________________________________________________  Follow up in 12 weeks.  Please call for an appointment.  Thank you for choosing me and [facility].  Jackquline Denmark, MD

## 2019-11-26 NOTE — Telephone Encounter (Signed)
Left message on patients voicemail regarding labs, CT scan, and medications.  Requested that patient return my call to go over everything with her.

## 2019-12-02 ENCOUNTER — Telehealth: Payer: Self-pay | Admitting: Gastroenterology

## 2019-12-02 NOTE — Telephone Encounter (Signed)
Pls call pt, she has some questions about CT scan. She also stated that she cannot take Prednisone because of bleeding ulcers.

## 2019-12-02 NOTE — Telephone Encounter (Signed)
Patient is scheduled for a CT on 12/04/19 and states she has a contrast allergy but cannot Prednisone because of history of bleeding ulcers.  Please advise.

## 2019-12-03 ENCOUNTER — Encounter: Payer: Self-pay | Admitting: Specialist

## 2019-12-03 ENCOUNTER — Telehealth: Payer: Self-pay | Admitting: Gastroenterology

## 2019-12-03 ENCOUNTER — Other Ambulatory Visit: Payer: Self-pay | Admitting: Specialist

## 2019-12-03 ENCOUNTER — Ambulatory Visit (INDEPENDENT_AMBULATORY_CARE_PROVIDER_SITE_OTHER): Payer: Medicare Other | Admitting: Specialist

## 2019-12-03 ENCOUNTER — Other Ambulatory Visit: Payer: Self-pay

## 2019-12-03 VITALS — BP 148/73 | HR 95 | Ht 60.0 in | Wt 160.0 lb

## 2019-12-03 DIAGNOSIS — M1712 Unilateral primary osteoarthritis, left knee: Secondary | ICD-10-CM | POA: Diagnosis not present

## 2019-12-03 DIAGNOSIS — M4722 Other spondylosis with radiculopathy, cervical region: Secondary | ICD-10-CM

## 2019-12-03 DIAGNOSIS — M19011 Primary osteoarthritis, right shoulder: Secondary | ICD-10-CM

## 2019-12-03 DIAGNOSIS — M47812 Spondylosis without myelopathy or radiculopathy, cervical region: Secondary | ICD-10-CM

## 2019-12-03 DIAGNOSIS — M4802 Spinal stenosis, cervical region: Secondary | ICD-10-CM

## 2019-12-03 DIAGNOSIS — M25511 Pain in right shoulder: Secondary | ICD-10-CM | POA: Diagnosis not present

## 2019-12-03 DIAGNOSIS — M5412 Radiculopathy, cervical region: Secondary | ICD-10-CM | POA: Diagnosis not present

## 2019-12-03 DIAGNOSIS — M25561 Pain in right knee: Secondary | ICD-10-CM

## 2019-12-03 MED ORDER — HYDROCODONE-ACETAMINOPHEN 5-325 MG PO TABS: 1.0000 | ORAL_TABLET | ORAL | 0 refills | Status: DC | PRN

## 2019-12-03 NOTE — Telephone Encounter (Signed)
Disregard message- added it onto the already opened telephone note, opened this one on accident.

## 2019-12-03 NOTE — Progress Notes (Signed)
Office Visit Note   Patient: Amy Moon           Date of Birth: February 11, 1951           MRN: [id number] Visit Date: 12/03/2019              Requested by: No referring provider defined for this encounter. PCP: Patient, No Pcp Per   Assessment & Plan: Visit Diagnoses:  1. Spinal stenosis of cervical region   2. Cervical radiculopathy at C5   3. Other spondylosis with radiculopathy, cervical region   4. Right shoulder pain, unspecified chronicity   5. Unilateral primary osteoarthritis, left knee   6. Right knee pain, unspecified chronicity   7. Primary osteoarthritis, right shoulder   8. Spondylosis without myelopathy or radiculopathy, cervical region     Plan: Avoid overhead lifting and overhead use of the arms. Do not lift greater than 5 lbs. Adjust head rest in vehicle to prevent hyperextension if rear ended. Take extra precautions to avoid falling. The main ways of treat osteoarthritis, that are found to be success. Weight loss helps to decrease pain. Exercise is important to maintaining cartilage and thickness and strengthening. NSAIDs like motrin, tylenol, alleve are meds decreasing the inflamation. Ice is okay  In afternoon and evening and hot shower in the am Avoid overhead lifting and overhead use of the arms. Do not lift greater than 5 lbs. Adjust head rest in vehicle to prevent hyperextension if rear ended. Take extra precautions to avoid falling, including use of a cane if you feel weak. Scheduling secretary Kandice Hams. will call you to arrange for surgery for your cervical spine. If you wish a second opinion please let us know and we can arrange for you. If you have worsening arm or leg numbness or weakness please call or go to an ER. We will contact your cardiologist and primary care physicians to seek clearance for your surgery. Surgery will be an anterior discectomy and fusion at the C3-4 andC4-5 levels with decompression of the cervical spinal canal at  C4-5 and C3-4 and removal of bone pressing on the spinal cord and right sided nerve roots. An additional decompression, fusion is done at these levels with bone grafting and plate and screws, Local bone graft as well and allograft bone graft and vivigen.  Risks of surgery include risks of infection, bleeding and risks to the spinal cord and  Risks of sore throat and difficulty swallowing which should  Improve over the next 4-6 weeks following surgery.Will need to use a hard brace for 6-12 weeks, at 6 weeks we do xrays to assess if a soft collar may be used.  Surgery is indicated due to upper extremity radiculopathy, lermittes phenomena and intractable right shoulder pain and weakness.  In the future surgery at adjacent levels may be necessary but these levels do not appear to be related to your current symptoms or signs.   Follow-Up Instructions: No follow-ups on file.   Orders:  No orders of the defined types were placed in this encounter.  No orders of the defined types were placed in this encounter.     Procedures: No procedures performed   Clinical Data: Findings:  CLINICAL DATA:  Chronic neck pain. Right shoulder and arm pain with numbness and paresthesia.  EXAM: MRI CERVICAL SPINE WITHOUT CONTRAST  TECHNIQUE: Multiplanar, multisequence MR imaging of the cervical spine was performed. No intravenous contrast was administered.  COMPARISON:  10/23/2017  FINDINGS: Alignment: 2 mm of anterolisthesis  at C3-4  Vertebrae: No fracture, evidence of discitis, or bone lesion.  Cord: Normal signal and morphology.  Posterior Fossa, vertebral arteries, paraspinal tissues: Negative.  Disc levels:  C2-3: Asymmetric left facet spurring. Ligamentum flavum thickening. The canal and foramina remain patent  C3-4: Facet degeneration with spurring asymmetric to the right. Disc narrowing with disc bulging and endplate/uncovertebral ridging. There is bilateral foraminal  impingement and mild spinal stenosis with ventral cord flattening.  C4-5: Disc narrowing endplate and uncovertebral ridging. Minor facet spurring. Moderate left foraminal narrowing.  C5-6: Mild facet spurring. Mild disc narrowing. Left uncovertebral spurring causes mild foraminal narrowing  C6-7: Leftward disc bulging and uncovertebral ridging with moderate foraminal narrowing. Patent spinal canal  C7-T1:Unremarkable.  IMPRESSION: 1. Resolved C3-4 facet arthritis since 2019, but there is new mild anterolisthesis at this level. 2. Stable spinal stenosis at C3-4 and C4-5 with mild ventral cord flattening at C3-4. 3. Foraminal impingement bilaterally at C3-4, on the left at C4-5, and on the left at C6-7.   Electronically Signed   By: Monte Fantasia M.D.   On: 09/01/2019 05:51    Subjective: Chief Complaint  Patient presents with  . Left Knee - Follow-up  . Neck - Follow-up    69 year old female with history of persistent severe right neck and right shoulder and arm pain. Pain with ROM of the neck and with movement of the right shoulder.  She has some numbness and tingling into the right radial hand that is intermittant. She has pain with weakness right shoulder and right arm. Night pain, she is taking Narcotic medications continuously for the pain. No bowel or bladder difficulty. She has been to PT and it was discontinues, the therapist did not think that they to could  Relieve the pain. She is also experiencing bilateral knee varus osteoarthitis to where she is having trouble with standing and walking and starting to get up in the AM and  From sitting for a short period of time. .   Review of Systems  Constitutional: Positive for activity change and unexpected weight change. Negative for appetite change, chills, diaphoresis, fatigue and fever.  HENT: Positive for congestion, sinus pressure and sinus pain. Negative for dental problem, drooling, ear discharge, ear  pain, facial swelling, hearing loss, mouth sores, nosebleeds, postnasal drip, rhinorrhea, sneezing, sore throat, tinnitus, trouble swallowing and voice change.   Eyes: Positive for redness and itching.  Respiratory: Positive for shortness of breath. Negative for apnea, cough, choking, chest tightness, wheezing and stridor.   Cardiovascular: Negative.  Negative for chest pain, palpitations and leg swelling.  Gastrointestinal: Negative.  Negative for abdominal distention, abdominal pain, anal bleeding, blood in stool, constipation, diarrhea, nausea, rectal pain and vomiting.  Endocrine: Negative.   Genitourinary: Negative.  Negative for difficulty urinating, dyspareunia, dysuria, enuresis, flank pain and urgency.  Musculoskeletal: Positive for arthralgias, gait problem, joint swelling, neck pain and neck stiffness. Negative for back pain and myalgias.  Skin: Negative.   Allergic/Immunologic: Negative.   Neurological: Positive for weakness and numbness.  Psychiatric/Behavioral: Negative.      Objective: Vital Signs: BP (!) 148/73 (BP Location: Left Arm, Patient Position: Sitting)   Pulse 95   Ht 5' (1.524 m)   Wt 160 lb (72.6 kg)   BMI 31.25 kg/m   Physical Exam Musculoskeletal:     Lumbar back: Negative right straight leg raise test and negative left straight leg raise test.     Back Exam   Tenderness  The patient is  experiencing tenderness in the cervical.  Range of Motion  Extension: abnormal  Flexion: abnormal  Lateral bend right: abnormal  Lateral bend left: normal  Rotation right: abnormal  Rotation left: normal   Muscle Strength  Right Quadriceps:  5/5  Left Quadriceps:  5/5  Right Hamstrings:  5/5  Left Hamstrings:  5/5   Tests  Straight leg raise right: negative Straight leg raise left: negative  Reflexes  Patellar: 2/4 Achilles: 2/4 Biceps: 0/4 Babinski's sign: normal   Other  Toe walk: normal Heel walk: normal Sensation: decreased Gait: antalgic    Erythema: no back redness Scars: absent  Comments:  Right biceps 4/5.  Right shoulder abductors 4/5 Spurling sign positive right upper neck C3-4 and C4-5    Right Shoulder Exam   Range of Motion  Active abduction: abnormal  Passive abduction: abnormal  Extension: abnormal  External rotation: abnormal  Forward flexion: abnormal   Muscle Strength  Abduction: 5/5  Internal rotation: 5/5  External rotation: 5/5  Supraspinatus: 5/5  Subscapularis: 5/5  Biceps: 5/5   Tests  Apprehension: negative Hawkins test: negative Cross arm: negative Impingement: negative Drop arm: negative Sulcus: absent  Comments:  Weak right shoulder abduction      Specialty Comments:  No specialty comments available.  Imaging: No results found.   PMFS History: Patient Active Problem List   Diagnosis Date Noted  . Stage 3 chronic kidney disease 10/21/2019  . History of smoking 25-50 pack years- quit age 55, smoked 2ppd from 69 yo-69 yo. 10/21/2019  . History of surgery- pt has several Sx- no personal issues wiht anesthesia  10/21/2019  . Sleep disturbance 10/21/2019  . History of exposure to asbestos 10/21/2019  . Family history of adverse reaction to anesthesia   . Dyspnea on exertion 10/17/2019  . Bronchitis 06/03/2018  . Encounter for Medicare annual wellness exam 05/09/2018  . Vitamin D deficiency 11/05/2017  . Encounter for hepatitis C virus screening test for high risk patient 11/05/2017  . Dysphagia 09/03/2017  . Thrombocytopenia ([place]) 09/03/2017  . Healthcare maintenance 07/30/2017  . Chronic pain syndrome 07/30/2017  . Chest pain 07/30/2017  . Parkinson's disease ([place]) 07/30/2017  . Hypertension 07/30/2017  . Severe obesity (BMI >= 40) ([place]) 05/15/2016  . Severe episode of recurrent major depressive disorder, without psychotic features ([place]) 05/15/2016  . Tremor of both hands 05/15/2016  . Nausea 03/01/2016  . Irritant contact dermatitis 08/25/2015   Past Medical  History:  Diagnosis Date  . Allergy   . Anemia   . Angina pectoris ([place])   . Arthritis   . Asthma    years ago  . Bronchitis 06/03/2018  . Cancer (HCC)    squamous cell carcinoma, nose  . Cataract   . Chest pain 07/30/2017  . Chronic kidney disease    possibly per pt  . Chronic pain syndrome 07/30/2017  . Cirrhosis ([place])   . Clotting disorder (HCC)    platelets are low  . Depression   . Drug-induced low platelet count   . Duodenal ulcer   . Dysphagia 09/03/2017  . Dyspnea on exertion 10/17/2019  . Encounter for hepatitis C virus screening test for high risk patient 11/05/2017  . Encounter for Medicare annual wellness exam 05/09/2018  . Enlarged thyroid   . Family history of adverse reaction to anesthesia    father had difficulty waking up and breathing on his own after surgery  . GERD (gastroesophageal reflux disease)   . Granulomatous disease ([place])   .  Healthcare maintenance 07/30/2017  . Hepatic disease   . Hiatal hernia   . History of exposure to asbestos 10/21/2019  . History of kidney stones   . History of smoking 25-50 pack years- quit age 89, smoked 2ppd from 69 yo-69 yo. 10/21/2019  . History of surgery- pt has several Sx- no personal issues wiht anesthesia  10/21/2019  . Hypertension   . Irritant contact dermatitis 08/25/2015  . Low vitamin D level   . Nausea 03/01/2016  . Neuropathy   . Osteopenia   . Osteoporosis   . Parkinson's disease ([place])   . Pneumonia   . Portal hypertension ([place])   . Severe episode of recurrent major depressive disorder, without psychotic features ([place]) 05/15/2016  . Severe obesity (BMI >= 40) ([place]) 05/15/2016  . Sleep disturbance 10/21/2019  . Splenomegaly a  . Stage 3 chronic kidney disease 10/21/2019  . Thrombocytopenia ([place]) 09/03/2017  . Tremor of both hands 05/15/2016  . Vitamin D deficiency 11/05/2017    Family History  Problem Relation Age of Onset  . Cancer Mother        ovarian  . Hyperlipidemia Mother   . Alzheimer's disease Mother    . Arthritis Mother        rheumatoid  . Cancer Father        lung  . Parkinson's disease Father   . Alzheimer's disease Father   . ALS Sister   . Cancer Maternal Aunt        pancreatic, lung, liver,breast  . Breast cancer Maternal Aunt   . Cancer Maternal Uncle        pancreatic, liver, brain  . Colon cancer Maternal Uncle   . Heart attack Paternal Aunt   . Stroke Paternal Aunt   . Stomach cancer Paternal Aunt   . Heart attack Paternal Uncle   . Stroke Paternal Grandmother   . Heart attack Paternal Grandfather   . Arthritis Sister        rheumatoid  . Huntington's disease Daughter        presumed inherited from father per patient  . Colon cancer Maternal Uncle   . Pancreatic cancer Maternal Uncle   . Liver disease Maternal Uncle   . Breast cancer Maternal Aunt   . Breast cancer Maternal Aunt   . Esophageal cancer Neg Hx   . Rectal cancer Neg Hx     Past Surgical History:  Procedure Laterality Date  . ABDOMINAL HYSTERECTOMY    . APPENDECTOMY    . BACK SURGERY     thoracic-fractured   . BACK SURGERY     lumbar - fractured  . BREAST LUMPECTOMY WITH RADIOACTIVE SEED LOCALIZATION Right 08/01/2018   Procedure: RADIOACTIVE SEED GUIDED RIGHT BREAST LUMPECTOMY;  Surgeon: Coralie Keens, MD;  Location: [facility];  Service: General;  Laterality: Right;  . ESOPHAGOGASTRODUODENOSCOPY  05/20/2008   Mild to moderate esophagitis. Otherwise normal EGD.   Marland Kitchen EYE SURGERY Left    cataract surgery with lens implant  . GASTROSTOMY     peptic ulcer - peg tube placement   Social History   Occupational History  . Not on file  Tobacco Use  . Smoking status: Former Smoker    Packs/day: 1.00    Years: 22.00    Pack years: 22.00    Types: Cigarettes    Quit date: 06/26/1986    Years since quitting: 33.4  . Smokeless tobacco: Never Used  Substance and Sexual Activity  . Alcohol use: No  . Drug  use: No  . Sexual activity: Not Currently    Birth control/protection: None

## 2019-12-03 NOTE — Patient Instructions (Signed)
Avoid overhead lifting and overhead use of the arms. Do not lift greater than 5 lbs. Adjust head rest in vehicle to prevent hyperextension if rear ended. Take extra precautions to avoid falling. The main ways of treat osteoarthritis, that are found to be success. Weight loss helps to decrease pain. Exercise is important to maintaining cartilage and thickness and strengthening. NSAIDs like motrin, tylenol, alleve are meds decreasing the inflamation. Ice is okay  In afternoon and evening and hot shower in the am Avoid overhead lifting and overhead use of the arms. Do not lift greater than 5 lbs. Adjust head rest in vehicle to prevent hyperextension if rear ended. Take extra precautions to avoid falling, including use of a cane if you feel weak. Scheduling secretary Kandice Hams. will call you to arrange for surgery for your cervical spine. If you wish a second opinion please let us know and we can arrange for you. If you have worsening arm or leg numbness or weakness please call or go to an ER. We will contact your cardiologist and primary care physicians to seek clearance for your surgery. Surgery will be an anterior discectomy and fusion at the C3-4 andC4-5 levels with decompression of the cervical spinal canal at C4-5 and C3-4 and removal of bone pressing on the spinal cord and right sided nerve roots. An additional decompression, fusion is done at these levels with bone grafting and plate and screws, Local bone graft as well and allograft bone graft and vivigen.  Risks of surgery include risks of infection, bleeding and risks to the spinal cord and  Risks of sore throat and difficulty swallowing which should  Improve over the next 4-6 weeks following surgery.Will need to use a hard brace for 6-12 weeks, at 6 weeks we do xrays to assess if a soft collar may be used.  Surgery is indicated due to upper extremity radiculopathy, lermittes phenomena and intractable right shoulder pain and  weakness.  In the future surgery at adjacent levels may be necessary but these levels do not appear to be related to your current symptoms or signs.

## 2019-12-03 NOTE — Addendum Note (Signed)
Addended by: Basil Dess on: 12/03/2019 10:53 AM   Modules accepted: Orders

## 2019-12-03 NOTE — Telephone Encounter (Signed)
Can you please advise?

## 2019-12-04 ENCOUNTER — Ambulatory Visit (HOSPITAL_BASED_OUTPATIENT_CLINIC_OR_DEPARTMENT_OTHER): Payer: Medicare Other

## 2019-12-05 NOTE — Telephone Encounter (Signed)
Prednisone for desensitization (3 doses) will not cause her to have any bleeding ulcers She can go ahead with desensitization protocol and get CT done  RG

## 2019-12-05 NOTE — Telephone Encounter (Signed)
Is this patient being scheduled for surgery?

## 2019-12-08 ENCOUNTER — Encounter: Payer: Self-pay | Admitting: Cardiology

## 2019-12-08 ENCOUNTER — Ambulatory Visit (INDEPENDENT_AMBULATORY_CARE_PROVIDER_SITE_OTHER): Payer: Medicare Other | Admitting: Cardiology

## 2019-12-08 ENCOUNTER — Other Ambulatory Visit: Payer: Self-pay

## 2019-12-08 VITALS — BP 140/88 | HR 94 | Ht 60.0 in | Wt 162.0 lb

## 2019-12-08 DIAGNOSIS — Z87891 Personal history of nicotine dependence: Secondary | ICD-10-CM

## 2019-12-08 DIAGNOSIS — R0600 Dyspnea, unspecified: Secondary | ICD-10-CM

## 2019-12-08 DIAGNOSIS — G894 Chronic pain syndrome: Secondary | ICD-10-CM

## 2019-12-08 DIAGNOSIS — I10 Essential (primary) hypertension: Secondary | ICD-10-CM | POA: Diagnosis not present

## 2019-12-08 DIAGNOSIS — R1310 Dysphagia, unspecified: Secondary | ICD-10-CM

## 2019-12-08 DIAGNOSIS — R079 Chest pain, unspecified: Secondary | ICD-10-CM

## 2019-12-08 DIAGNOSIS — R0609 Other forms of dyspnea: Secondary | ICD-10-CM

## 2019-12-08 NOTE — Progress Notes (Signed)
Cardiology Office Note:    Date:  12/08/2019   ID:  Amy Moon, DOB Aug 09, 1950, MRN [id number]  PCP:  Patient, No Pcp Per  Cardiologist:  Jenne Campus, MD    Referring MD: No ref. provider found   Chief Complaint  Patient presents with  . Follow-up  Am still short of breath  History of Present Illness:    Amy Moon is a 69 y.o. female with past medical history significant for asbestos exposure, chronic smoking that she quit years ago, obesity, essential hypertension, atypical chest pain.  She been also complaining of having dyspnea on exertion.  In 2019 we did work-up for her shortness of breath and chest pain.  Stress test was negative echocardiogram did not show any alarming findings.  However she returned to [place] again with similar complaints.  On top of that new issue arose.  She is contemplating having back/neck surgery.  As a part of evaluation we need to make sure she does not have any significant coronary artery disease.  Still described to have some chest pain sometimes with exercise.  Overall ability to exercise is limited.  Past Medical History:  Diagnosis Date  . Allergy   . Anemia   . Angina pectoris ([place])   . Arthritis   . Asthma    years ago  . Bronchitis 06/03/2018  . Cancer (HCC)    squamous cell carcinoma, nose  . Cataract   . Chest pain 07/30/2017  . Chronic kidney disease    possibly per pt  . Chronic pain syndrome 07/30/2017  . Cirrhosis ([place])   . Clotting disorder (HCC)    platelets are low  . Depression   . Drug-induced low platelet count   . Duodenal ulcer   . Dysphagia 09/03/2017  . Dyspnea on exertion 10/17/2019  . Encounter for hepatitis C virus screening test for high risk patient 11/05/2017  . Encounter for Medicare annual wellness exam 05/09/2018  . Enlarged thyroid   . Family history of adverse reaction to anesthesia    father had difficulty waking up and breathing on his own after surgery  . GERD (gastroesophageal reflux disease)   .  Granulomatous disease ([place])   . Healthcare maintenance 07/30/2017  . Hepatic disease   . Hiatal hernia   . History of exposure to asbestos 10/21/2019  . History of kidney stones   . History of smoking 25-50 pack years- quit age 31, smoked 2ppd from 69 yo-69 yo. 10/21/2019  . History of surgery- pt has several Sx- no personal issues wiht anesthesia  10/21/2019  . Hypertension   . Irritant contact dermatitis 08/25/2015  . Low vitamin D level   . Nausea 03/01/2016  . Neuropathy   . Osteopenia   . Osteoporosis   . Parkinson's disease ([place])   . Pneumonia   . Portal hypertension ([place])   . Severe episode of recurrent major depressive disorder, without psychotic features ([place]) 05/15/2016  . Severe obesity (BMI >= 40) ([place]) 05/15/2016  . Sleep disturbance 10/21/2019  . Splenomegaly a  . Stage 3 chronic kidney disease 10/21/2019  . Thrombocytopenia ([place]) 09/03/2017  . Tremor of both hands 05/15/2016  . Vitamin D deficiency 11/05/2017    Past Surgical History:  Procedure Laterality Date  . ABDOMINAL HYSTERECTOMY    . APPENDECTOMY    . BACK SURGERY     thoracic-fractured   . BACK SURGERY     lumbar - fractured  . BREAST LUMPECTOMY WITH RADIOACTIVE SEED LOCALIZATION Right 08/01/2018  Procedure: RADIOACTIVE SEED GUIDED RIGHT BREAST LUMPECTOMY;  Surgeon: Coralie Keens, MD;  Location: [facility];  Service: General;  Laterality: Right;  . ESOPHAGOGASTRODUODENOSCOPY  05/20/2008   Mild to moderate esophagitis. Otherwise normal EGD.   Marland Kitchen EYE SURGERY Left    cataract surgery with lens implant  . GASTROSTOMY     peptic ulcer - peg tube placement    Current Medications: Current Meds  Medication Sig  . acetaminophen (TYLENOL) 650 MG CR tablet Take 650 mg by mouth every 6 (six) hours as needed for pain.  . bisacodyl (DULCOLAX) 5 MG EC tablet Take 15 mg by mouth at bedtime as needed for moderate constipation.  . clopidogrel (PLAVIX) 75 MG tablet Take 1 tablet (75 mg total) by mouth daily.  .  cyclobenzaprine (FLEXERIL) 10 MG tablet TAKE 1 TABLET BY MOUTH EVERY 8 HOURS AS NEEDED  . diphenhydrAMINE (BENADRYL) 25 MG tablet Take 25 mg by mouth every 6 (six) hours as needed.  . fluticasone (CUTIVATE) 0.05 % cream Apply topically 2 (two) times daily. Use for 10 days.  Marland Kitchen gabapentin (NEURONTIN) 300 MG capsule Take 2 capsules (600 mg total) by mouth at bedtime AND 1 capsule (300 mg total) 2 (two) times daily.  Marland Kitchen HYDROcodone-acetaminophen (NORCO/VICODIN) 5-325 MG tablet Take 1 tablet by mouth every 4 (four) hours as needed for up to 5 days for moderate pain.  . nitroGLYCERIN (NITROSTAT) 0.4 MG SL tablet Place 0.4 mg under the tongue every 5 (five) minutes as needed for chest pain.   . pantoprazole (PROTONIX) 40 MG tablet Take 1 tablet (40 mg total) by mouth daily.  . predniSONE (DELTASONE) 50 MG tablet Take 50 mg13 hours before, 50 mg 7 hrs before, and 50 mg 1 hour before scan  . promethazine (PHENERGAN) 25 MG tablet TAKE 1 TABLET BY MOUTH EVERY 6 HOURS AS NEEDED FOR NAUSEA OR VOMITING.  . ranolazine (RANEXA) 500 MG 12 hr tablet Take 1 tablet (500 mg total) by mouth 2 (two) times daily.  . Vitamin D, Ergocalciferol, (DRISDOL) 1.25 MG (50000 UT) CAPS capsule Take 1 capsule (50,000 Units total) by mouth every 7 (seven) days.   Current Facility-Administered Medications for the 12/08/19 encounter (Office Visit) with Park Liter, MD  Medication  . bupivacaine (MARCAINE) 0.25 % (with pres) injection 2 mL     Allergies:   Iodinated diagnostic agents, Nsaids, Latex, Sinemet [carbidopa w-levodopa], Inderal [propranolol], Indomethacin, Penicillin g, Pneumococcal vaccines, Scopolamine, and Tape   Social History   Socioeconomic History  . Marital status: Married    Spouse name: Not on file  . Number of children: 3  . Years of education: Not on file  . Highest education level: Not on file  Occupational History  . Not on file  Tobacco Use  . Smoking status: Former Smoker    Packs/day: 1.00      Years: 22.00    Pack years: 22.00    Types: Cigarettes    Quit date: 06/26/1986    Years since quitting: 33.4  . Smokeless tobacco: Never Used  Vaping Use  . Vaping Use: Never used  Substance and Sexual Activity  . Alcohol use: No  . Drug use: No  . Sexual activity: Not Currently    Birth control/protection: None  Other Topics Concern  . Not on file  Social History Narrative  . Not on file   Social Determinants of Health   Financial Resource Strain:   . Difficulty of Paying Living Expenses:   Food Insecurity:   .  Worried About Charity fundraiser in the Last Year:   . Arboriculturist in the Last Year:   Transportation Needs:   . Film/video editor (Medical):   Marland Kitchen Lack of Transportation (Non-Medical):   Physical Activity:   . Days of Exercise per Week:   . Minutes of Exercise per Session:   Stress:   . Feeling of Stress :   Social Connections:   . Frequency of Communication with Friends and Family:   . Frequency of Social Gatherings with Friends and Family:   . Attends Religious Services:   . Active Member of Clubs or Organizations:   . Attends Archivist Meetings:   Marland Kitchen Marital Status:      Family History: The patient's family history includes ALS in her sister; Alzheimer's disease in her father and mother; Arthritis in her mother and sister; Breast cancer in her maternal aunt, maternal aunt, and maternal aunt; Cancer in her father, maternal aunt, maternal uncle, and mother; Colon cancer in her maternal uncle and maternal uncle; Heart attack in her paternal aunt, paternal grandfather, and paternal uncle; Huntington's disease in her daughter; Hyperlipidemia in her mother; Liver disease in her maternal uncle; Pancreatic cancer in her maternal uncle; Parkinson's disease in her father; Stomach cancer in her paternal aunt; Stroke in her paternal aunt and paternal grandmother. There is no history of Esophageal cancer or Rectal cancer. ROS:   Please see the history  of present illness.    All 14 point review of systems negative except as described per history of present illness  EKGs/Labs/Other Studies Reviewed:      Recent Labs: 06/18/2019: TSH 0.254 09/17/2019: ALT 12; Hemoglobin 13.0; Platelets 137.0 10/17/2019: BUN 11; Creatinine, Ser 0.76; NT-Pro BNP 33; Potassium 3.9; Sodium 139  Recent Lipid Panel    Component Value Date/Time   CHOL 191 06/18/2019 1009   TRIG 87 06/18/2019 1009   HDL 54 06/18/2019 1009   CHOLHDL 3.5 06/18/2019 1009   LDLCALC 121 (H) 06/18/2019 1009    Physical Exam:    VS:  BP 140/88   Pulse 94   Ht 5' (1.524 m)   Wt 162 lb (73.5 kg)   SpO2 96%   BMI 31.64 kg/m     Wt Readings from Last 3 Encounters:  12/08/19 162 lb (73.5 kg)  12/03/19 160 lb (72.6 kg)  11/26/19 155 lb (70.3 kg)     GEN:  Well nourished, well developed in no acute distress HEENT: Normal NECK: No JVD; No carotid bruits LYMPHATICS: No lymphadenopathy CARDIAC: RRR, no murmurs, no rubs, no gallops RESPIRATORY:  Clear to auscultation without rales, wheezing or rhonchi  ABDOMEN: Soft, non-tender, non-distended MUSCULOSKELETAL:  No edema; No deformity  SKIN: Warm and dry LOWER EXTREMITIES: no swelling NEUROLOGIC:  Alert and oriented x 3 PSYCHIATRIC:  Normal affect   ASSESSMENT:    1. Dysphagia, unspecified type   2. Essential hypertension   3. Dyspnea on exertion   4. Chronic pain syndrome   5. History of smoking 25-50 pack years- quit age 63, smoked 2ppd from 68 yo-69 yo.    PLAN:    In order of problems listed above:  1. Dyspnea on exertion: Probably multifactorial.  So far echocardiogram showed preserved left ventricle ejection fraction does not explain symptomatology, proBNP was normal.  I will schedule her to have a coronary CT angio to look potentially at coronary artery disease.  On top of that that will allow [place] to look at her lungs.  2. Essential hypertension blood pressure appears to be controlled we will continue present  management. 3. Chronic pain syndrome: Noted. 4. History of asbestos exposure as well as chronic smoking.  She quit years ago.  She may need to see pulmonologist if cardiac evaluation will be negative for explanation for shortness of breath. 5. Preop evaluation for neck surgery under general anesthesia.  Coronary CT angio will be done to rule out potentially significant coronary artery disease.   Medication Adjustments/Labs and Tests Ordered: Current medicines are reviewed at length with the patient today.  Concerns regarding medicines are outlined above.  No orders of the defined types were placed in this encounter.  Medication changes: No orders of the defined types were placed in this encounter.   Signed, Park Liter, MD, [facility] 12/08/2019 4:43 PM    [place]

## 2019-12-08 NOTE — Patient Instructions (Signed)
Medication Instructions:  Your physician recommends that you continue on your current medications as directed. Please refer to the Current Medication list given to you today.  *If you need a refill on your cardiac medications before your next appointment, please call your pharmacy*   Lab Work: Your physician recommends that you return for lab work today: bmp 3-7 days before ct  If you have labs (blood work) drawn today and your tests are completely normal, you will receive your results only by: Marland Kitchen MyChart Message (if you have MyChart) OR . A paper copy in the mail If you have any lab test that is abnormal or we need to change your treatment, we will call you to review the results.   Testing/Procedures: Your cardiac CT will be scheduled at one of the below locations:   [facility] [address] [place], [place] [address] [phone]  [facility] [address] [place], [place] [address] [phone]  If scheduled at [facility], please arrive at the [facility] main entrance of [facility] 30 minutes prior to test start time. Proceed to the [facility] Radiology Department (first floor) to check-in and test prep.  If scheduled at [facility], please arrive 15 mins early for check-in and test prep.  Please follow these instructions carefully (unless otherwise directed):    On the Night Before the Test: . Be sure to Drink plenty of water. . Do not consume any caffeinated/decaffeinated beverages or chocolate 12 hours prior to your test. . Do not take any antihistamines 12 hours prior to your test.  On the Day of the Test: . Drink plenty of water. Do not drink any water within one hour of the test. . Do not eat any food 4 hours prior to the test. . You may take your regular medications prior to the test.  . FEMALES- please wear underwire-free bra if available           After the Test: . Drink plenty of water. . After receiving IV contrast, you may experience a mild flushed feeling. This is normal. . On occasion, you may experience a mild rash up to 24 hours after the test. This is not dangerous. If this occurs, you can take Benadryl 25 mg and increase your fluid intake. . If you experience trouble breathing, this can be serious. If it is severe call 911 IMMEDIATELY. If it is mild, please call our office. If you take any of these medications: Glipizide/Metformin, Avandament, Glucavance, please do not take 48 hours after completing test unless otherwise instructed.   Once we have confirmed authorization from your insurance company, we will call you to set up a date and time for your test.   For non-scheduling related questions, please contact the cardiac imaging nurse navigator should you have any questions/concerns: Marchia Bond, Cardiac Imaging Nurse Navigator Burley Saver, Interim Cardiac Imaging Nurse [facility] Direct Office Dial: [phone]   For scheduling needs, including cancellations and rescheduling, please call [phone].     Follow-Up: At [facility], you and your health needs are our priority.  As part of our continuing mission to provide you with exceptional heart care, we have created designated Provider Care Teams.  These Care Teams include your primary Cardiologist (physician) and Advanced Practice Providers (APPs -  Physician Assistants and Nurse Practitioners) who all work together to provide you with the care you need, when  you need it.  We recommend signing up for the patient portal called "MyChart".  Sign up information is provided on this After Visit Summary.  MyChart is used to connect with patients for Virtual Visits (Telemedicine).  Patients are able to view lab/test results, encounter notes, upcoming appointments, etc.  Non-urgent messages can be sent to your provider as well.   To learn  more about what you can do with MyChart, go to [email or web address].    Your next appointment:   2 month(s)  The format for your next appointment:   In Person  Provider:   Jenne Campus, MD   Other Instructions   Cardiac CT Angiogram A cardiac CT angiogram is a procedure to look at the heart and the area around the heart. It may be done to help find the cause of chest pains or other symptoms of heart disease. During this procedure, a substance called contrast dye is injected into the blood vessels in the area to be checked. A large X-ray machine, called a CT scanner, then takes detailed pictures of the heart and the surrounding area. The procedure is also sometimes called a coronary CT angiogram, coronary artery scanning, or CTA. A cardiac CT angiogram allows the health care provider to see how well blood is flowing to and from the heart. The health care provider will be able to see if there are any problems, such as:  Blockage or narrowing of the coronary arteries in the heart.  Fluid around the heart.  Signs of weakness or disease in the muscles, valves, and tissues of the heart. Tell a health care provider about:  Any allergies you have. This is especially important if you have had a previous allergic reaction to contrast dye.  All medicines you are taking, including vitamins, herbs, eye drops, creams, and over-the-counter medicines.  Any blood disorders you have.  Any surgeries you have had.  Any medical conditions you have.  Whether you are pregnant or may be pregnant.  Any anxiety disorders, chronic pain, or other conditions you have that may increase your stress or prevent you from lying still. What are the risks? Generally, this is a safe procedure. However, problems may occur, including:  Bleeding.  Infection.  Allergic reactions to medicines or dyes.  Damage to other structures or organs.  Kidney damage from the contrast dye that is used.  Increased  risk of cancer from radiation exposure. This risk is low. Talk with your health care provider about: ? The risks and benefits of testing. ? How you can receive the lowest dose of radiation. What happens before the procedure?  Wear comfortable clothing and remove any jewelry, glasses, dentures, and hearing aids.  Follow instructions from your health care provider about eating and drinking. This may include: ? For 12 hours before the procedure -- avoid caffeine. This includes tea, coffee, soda, energy drinks, and diet pills. Drink plenty of water or other fluids that do not have caffeine in them. Being well hydrated can prevent complications. ? For 4-6 hours before the procedure -- stop eating and drinking. The contrast dye can cause nausea, but this is less likely if your stomach is empty.  Ask your health care provider about changing or stopping your regular medicines. This is especially important if you are taking diabetes medicines, blood thinners, or medicines to treat problems with erections (erectile dysfunction). What happens during the procedure?   Hair on your chest may need to be removed so that small sticky patches called electrodes  can be placed on your chest. These will transmit information that helps to monitor your heart during the procedure.  An IV will be inserted into one of your veins.  You might be given a medicine to control your heart rate during the procedure. This will help to ensure that good images are obtained.  You will be asked to lie on an exam table. This table will slide in and out of the CT machine during the procedure.  Contrast dye will be injected into the IV. You might feel warm, or you may get a metallic taste in your mouth.  You will be given a medicine called nitroglycerin. This will relax or dilate the arteries in your heart.  The table that you are lying on will move into the CT machine tunnel for the scan.  The person running the machine will  give you instructions while the scans are being done. You may be asked to: ? Keep your arms above your head. ? Hold your breath. ? Stay very still, even if the table is moving.  When the scanning is complete, you will be moved out of the machine.  The IV will be removed. The procedure may vary among health care providers and hospitals. What can I expect after the procedure? After your procedure, it is common to have:  A metallic taste in your mouth from the contrast dye.  A feeling of warmth.  A headache from the nitroglycerin. Follow these instructions at home:  Take over-the-counter and prescription medicines only as told by your health care provider.  If you are told, drink enough fluid to keep your urine pale yellow. This will help to flush the contrast dye out of your body.  Most people can return to their normal activities right after the procedure. Ask your health care provider what activities are safe for you.  It is up to you to get the results of your procedure. Ask your health care provider, or the department that is doing the procedure, when your results will be ready.  Keep all follow-up visits as told by your health care provider. This is important. Contact a health care provider if:  You have any symptoms of allergy to the contrast dye. These include: ? Shortness of breath. ? Rash or hives. ? A racing heartbeat. Summary  A cardiac CT angiogram is a procedure to look at the heart and the area around the heart. It may be done to help find the cause of chest pains or other symptoms of heart disease.  During this procedure, a large X-ray machine, called a CT scanner, takes detailed pictures of the heart and the surrounding area after a contrast dye has been injected into blood vessels in the area.  Ask your health care provider about changing or stopping your regular medicines before the procedure. This is especially important if you are taking diabetes medicines,  blood thinners, or medicines to treat erectile dysfunction.  If you are told, drink enough fluid to keep your urine pale yellow. This will help to flush the contrast dye out of your body. This information is not intended to replace advice given to you by your health care provider. Make sure you discuss any questions you have with your health care provider. Document Revised: 02/05/2019 Document Reviewed: 02/05/2019 Elsevier Patient Education  [place].

## 2019-12-08 NOTE — Telephone Encounter (Signed)
Spoke with patient.  She is advised to take three doses of Prednisone as prescribed per Dr. Lyndel Safe along with Benadryl. Patient is aware that she needs to go to [facility] for her labs THIS week before CT scan next week at [facility]. Patient is coming today to pick up contrast.

## 2019-12-08 NOTE — Telephone Encounter (Signed)
Pt called back.  She seems confused.  She stated that she needs contrast for CT which has been cancelled.  She also mentioned that she cannot take prednisone.

## 2019-12-15 ENCOUNTER — Telehealth: Payer: Self-pay | Admitting: Gastroenterology

## 2019-12-15 ENCOUNTER — Telehealth: Payer: Self-pay | Admitting: Emergency Medicine

## 2019-12-15 ENCOUNTER — Other Ambulatory Visit: Payer: Self-pay | Admitting: Specialist

## 2019-12-15 NOTE — Telephone Encounter (Signed)
Patient called back. After discussing we realized that the ct on wednesday is actually a ct abdomen set up by Dr. Steve Rattler office. She will reach out to them because she actually needs to reschedule. No further questions.

## 2019-12-15 NOTE — Telephone Encounter (Signed)
Pt is requesting to reschedule her CT scan from 6/23 to sometime next week if possible.

## 2019-12-15 NOTE — Telephone Encounter (Signed)
Left message for patient to return call. She needs to be reminded that she needs labs drawn before her upcoming ct.

## 2019-12-15 NOTE — Telephone Encounter (Signed)
Pt rescheduled for 12/25/19.  Pt aware to have her labs a couple of days before.

## 2019-12-17 ENCOUNTER — Telehealth: Payer: Self-pay | Admitting: Gastroenterology

## 2019-12-17 ENCOUNTER — Inpatient Hospital Stay (HOSPITAL_BASED_OUTPATIENT_CLINIC_OR_DEPARTMENT_OTHER): Admission: RE | Admit: 2019-12-17 | Payer: Medicare Other | Source: Ambulatory Visit

## 2019-12-17 ENCOUNTER — Other Ambulatory Visit (INDEPENDENT_AMBULATORY_CARE_PROVIDER_SITE_OTHER): Payer: Medicare Other

## 2019-12-17 DIAGNOSIS — R1011 Right upper quadrant pain: Secondary | ICD-10-CM | POA: Diagnosis not present

## 2019-12-17 DIAGNOSIS — R112 Nausea with vomiting, unspecified: Secondary | ICD-10-CM | POA: Diagnosis not present

## 2019-12-17 DIAGNOSIS — Z1212 Encounter for screening for malignant neoplasm of rectum: Secondary | ICD-10-CM

## 2019-12-17 DIAGNOSIS — K219 Gastro-esophageal reflux disease without esophagitis: Secondary | ICD-10-CM

## 2019-12-17 DIAGNOSIS — K5909 Other constipation: Secondary | ICD-10-CM

## 2019-12-17 DIAGNOSIS — Z1211 Encounter for screening for malignant neoplasm of colon: Secondary | ICD-10-CM | POA: Diagnosis not present

## 2019-12-17 DIAGNOSIS — K7469 Other cirrhosis of liver: Secondary | ICD-10-CM

## 2019-12-17 DIAGNOSIS — R1310 Dysphagia, unspecified: Secondary | ICD-10-CM | POA: Diagnosis not present

## 2019-12-17 LAB — BUN: BUN: 9 mg/dL (ref 6–23)

## 2019-12-17 LAB — CREATININE, SERUM: Creatinine, Ser: 0.68 mg/dL (ref 0.40–1.20)

## 2019-12-17 NOTE — Telephone Encounter (Signed)
Spoke with patient. Advised okay to have labs drawn today.

## 2019-12-22 ENCOUNTER — Other Ambulatory Visit: Payer: Self-pay

## 2019-12-22 ENCOUNTER — Ambulatory Visit (INDEPENDENT_AMBULATORY_CARE_PROVIDER_SITE_OTHER): Payer: Medicare Other | Admitting: Physician Assistant

## 2019-12-22 ENCOUNTER — Encounter: Payer: Self-pay | Admitting: Physician Assistant

## 2019-12-22 VITALS — BP 109/73 | HR 105 | Temp 98.0°F | Ht 60.0 in | Wt 168.5 lb

## 2019-12-22 DIAGNOSIS — R0609 Other forms of dyspnea: Secondary | ICD-10-CM

## 2019-12-22 DIAGNOSIS — I10 Essential (primary) hypertension: Secondary | ICD-10-CM

## 2019-12-22 DIAGNOSIS — R0600 Dyspnea, unspecified: Secondary | ICD-10-CM | POA: Diagnosis not present

## 2019-12-22 DIAGNOSIS — R079 Chest pain, unspecified: Secondary | ICD-10-CM

## 2019-12-22 DIAGNOSIS — F329 Major depressive disorder, single episode, unspecified: Secondary | ICD-10-CM

## 2019-12-22 DIAGNOSIS — F419 Anxiety disorder, unspecified: Secondary | ICD-10-CM | POA: Diagnosis not present

## 2019-12-22 DIAGNOSIS — G4700 Insomnia, unspecified: Secondary | ICD-10-CM | POA: Diagnosis not present

## 2019-12-22 MED ORDER — TRAZODONE HCL 50 MG PO TABS: 25.0000 mg | ORAL_TABLET | Freq: Every evening | ORAL | 3 refills | Status: DC | PRN

## 2019-12-22 MED ORDER — LOSARTAN POTASSIUM 50 MG PO TABS: 50.0000 mg | ORAL_TABLET | Freq: Every day | ORAL | 3 refills | Status: DC

## 2019-12-22 MED ORDER — SERTRALINE HCL 25 MG PO TABS: 25.0000 mg | ORAL_TABLET | Freq: Every day | ORAL | 2 refills | Status: DC

## 2019-12-22 NOTE — Patient Instructions (Signed)
Managing Anxiety, Adult After being diagnosed with an anxiety disorder, you may be relieved to know why you have felt or behaved a certain way. You may also feel overwhelmed about the treatment ahead and what it will mean for your life. With care and support, you can manage this condition and recover from it. How to manage lifestyle changes Managing stress and anxiety  Stress is your body's reaction to life changes and events, both good and bad. Most stress will last just a few hours, but stress can be ongoing and can lead to more than just stress. Although stress can play a major role in anxiety, it is not the same as anxiety. Stress is usually caused by something external, such as a deadline, test, or competition. Stress normally passes after the triggering event has ended.  Anxiety is caused by something internal, such as imagining a terrible outcome or worrying that something will go wrong that will devastate you. Anxiety often does not go away even after the triggering event is over, and it can become long-term (chronic) worry. It is important to understand the differences between stress and anxiety and to manage your stress effectively so that it does not lead to an anxious response. Talk with your health care provider or a counselor to learn more about reducing anxiety and stress. He or she may suggest tension reduction techniques, such as:  Music therapy. This can include creating or listening to music that you enjoy and that inspires you.  Mindfulness-based meditation. This involves being aware of your normal breaths while not trying to control your breathing. It can be done while sitting or walking.  Centering prayer. This involves focusing on a word, phrase, or sacred image that means something to you and brings you peace.  Deep breathing. To do this, expand your stomach and inhale slowly through your nose. Hold your breath for 3-5 seconds. Then exhale slowly, letting your stomach muscles  relax.  Self-talk. This involves identifying thought patterns that lead to anxiety reactions and changing those patterns.  Muscle relaxation. This involves tensing muscles and then relaxing them. Choose a tension reduction technique that suits your lifestyle and personality. These techniques take time and practice. Set aside 5-15 minutes a day to do them. Therapists can offer counseling and training in these techniques. The training to help with anxiety may be covered by some insurance plans. Other things you can do to manage stress and anxiety include:  Keeping a stress/anxiety diary. This can help you learn what triggers your reaction and then learn ways to manage your response.  Thinking about how you react to certain situations. You may not be able to control everything, but you can control your response.  Making time for activities that help you relax and not feeling guilty about spending your time in this way.  Visual imagery and yoga can help you stay calm and relax.  Medicines Medicines can help ease symptoms. Medicines for anxiety include:  Anti-anxiety drugs.  Antidepressants. Medicines are often used as a primary treatment for anxiety disorder. Medicines will be prescribed by a health care provider. When used together, medicines, psychotherapy, and tension reduction techniques may be the most effective treatment. Relationships Relationships can play a big part in helping you recover. Try to spend more time connecting with trusted friends and family members. Consider going to couples counseling, taking family education classes, or going to family therapy. Therapy can help you and others better understand your condition. How to recognize changes in your   anxiety Everyone responds differently to treatment for anxiety. Recovery from anxiety happens when symptoms decrease and stop interfering with your daily activities at home or work. This may mean that you will start to:  Have  better concentration and focus. Worry will interfere less in your daily thinking.  Sleep better.  Be less irritable.  Have more energy.  Have improved memory. It is important to recognize when your condition is getting worse. Contact your health care provider if your symptoms interfere with home or work and you feel like your condition is not improving. Follow these instructions at home: Activity  Exercise. Most adults should do the following: ? Exercise for at least 150 minutes each week. The exercise should increase your heart rate and make you sweat (moderate-intensity exercise). ? Strengthening exercises at least twice a week.  Get the right amount and quality of sleep. Most adults need 7-9 hours of sleep each night. Lifestyle   Eat a healthy diet that includes plenty of vegetables, fruits, whole grains, low-fat dairy products, and lean protein. Do not eat a lot of foods that are high in solid fats, added sugars, or salt.  Make choices that simplify your life.  Do not use any products that contain nicotine or tobacco, such as cigarettes, e-cigarettes, and chewing tobacco. If you need help quitting, ask your health care provider.  Avoid caffeine, alcohol, and certain over-the-counter cold medicines. These may make you feel worse. Ask your pharmacist which medicines to avoid. General instructions  Take over-the-counter and prescription medicines only as told by your health care provider.  Keep all follow-up visits as told by your health care provider. This is important. Where to find support You can get help and support from these sources:  Self-help groups.  Online and community organizations.  A trusted spiritual leader.  Couples counseling.  Family education classes.  Family therapy. Where to find more information You may find that joining a support group helps you deal with your anxiety. The following sources can help you locate counselors or support groups near  you:  Mental Health [place]: www.mentalhealthamerica.net  Anxiety and Depression Association of America (ADAA): www.adaa.org  National Alliance on Mental Illness (NAMI): www.nami.org Contact a health care provider if you:  Have a hard time staying focused or finishing daily tasks.  Spend many hours a day feeling worried about everyday life.  Become exhausted by worry.  Start to have headaches, feel tense, or have nausea.  Urinate more than normal.  Have diarrhea. Get help right away if you have:  A racing heart and shortness of breath.  Thoughts of hurting yourself or others. If you ever feel like you may hurt yourself or others, or have thoughts about taking your own life, get help right away. You can go to your nearest emergency department or call:  Your local emergency services (911 in the [place]  A suicide crisis helpline, such as the National Suicide Prevention Lifeline at 1-800-273-8255. This is open 24 hours a day. Summary  Taking steps to learn and use tension reduction techniques can help calm you and help prevent triggering an anxiety reaction.  When used together, medicines, psychotherapy, and tension reduction techniques may be the most effective treatment.  Family, friends, and partners can play a big part in helping you recover from an anxiety disorder. This information is not intended to replace advice given to you by your health care provider. Make sure you discuss any questions you have with your health care provider. Document Revised:   11/12/2018 Document Reviewed: 11/12/2018 Elsevier Patient Education  [place]. Insomnia Insomnia is a sleep disorder that makes it difficult to fall asleep or stay asleep. Insomnia can cause fatigue, low energy, difficulty concentrating, mood swings, and poor performance at work or school. There are three different ways to classify insomnia:  Difficulty falling asleep.  Difficulty staying asleep.  Waking up too  early in the morning. Any type of insomnia can be long-term (chronic) or short-term (acute). Both are common. Short-term insomnia usually lasts for three months or less. Chronic insomnia occurs at least three times a week for longer than three months. What are the causes? Insomnia may be caused by another condition, situation, or substance, such as:  Anxiety.  Certain medicines.  Gastroesophageal reflux disease (GERD) or other gastrointestinal conditions.  Asthma or other breathing conditions.  Restless legs syndrome, sleep apnea, or other sleep disorders.  Chronic pain.  Menopause.  Stroke.  Abuse of alcohol, tobacco, or illegal drugs.  Mental health conditions, such as depression.  Caffeine.  Neurological disorders, such as Alzheimer's disease.  An overactive thyroid (hyperthyroidism). Sometimes, the cause of insomnia may not be known. What increases the risk? Risk factors for insomnia include:  Gender. Women are affected more often than men.  Age. Insomnia is more common as you get older.  Stress.  Lack of exercise.  Irregular work schedule or working night shifts.  Traveling between different time zones.  Certain medical and mental health conditions. What are the signs or symptoms? If you have insomnia, the main symptom is having trouble falling asleep or having trouble staying asleep. This may lead to other symptoms, such as:  Feeling fatigued or having low energy.  Feeling nervous about going to sleep.  Not feeling rested in the morning.  Having trouble concentrating.  Feeling irritable, anxious, or depressed. How is this diagnosed? This condition may be diagnosed based on:  Your symptoms and medical history. Your health care provider may ask about: ? Your sleep habits. ? Any medical conditions you have. ? Your mental health.  A physical exam. How is this treated? Treatment for insomnia depends on the cause. Treatment may focus on treating an  underlying condition that is causing insomnia. Treatment may also include:  Medicines to help you sleep.  Counseling or therapy.  Lifestyle adjustments to help you sleep better. Follow these instructions at home: Eating and drinking   Limit or avoid alcohol, caffeinated beverages, and cigarettes, especially close to bedtime. These can disrupt your sleep.  Do not eat a large meal or eat spicy foods right before bedtime. This can lead to digestive discomfort that can make it hard for you to sleep. Sleep habits   Keep a sleep diary to help you and your health care provider figure out what could be causing your insomnia. Write down: ? When you sleep. ? When you wake up during the night. ? How well you sleep. ? How rested you feel the next day. ? Any side effects of medicines you are taking. ? What you eat and drink.  Make your bedroom a dark, comfortable place where it is easy to fall asleep. ? Put up shades or blackout curtains to block light from outside. ? Use a white noise machine to block noise. ? Keep the temperature cool.  Limit screen use before bedtime. This includes: ? Watching TV. ? Using your smartphone, tablet, or computer.  Stick to a routine that includes going to bed and waking up at the same times  every day and night. This can help you fall asleep faster. Consider making a quiet activity, such as reading, part of your nighttime routine.  Try to avoid taking naps during the day so that you sleep better at night.  Get out of bed if you are still awake after 15 minutes of trying to sleep. Keep the lights down, but try reading or doing a quiet activity. When you feel sleepy, go back to bed. General instructions  Take over-the-counter and prescription medicines only as told by your health care provider.  Exercise regularly, as told by your health care provider. Avoid exercise starting several hours before bedtime.  Use relaxation techniques to manage stress. Ask  your health care provider to suggest some techniques that may work well for you. These may include: ? Breathing exercises. ? Routines to release muscle tension. ? Visualizing peaceful scenes.  Make sure that you drive carefully. Avoid driving if you feel very sleepy.  Keep all follow-up visits as told by your health care provider. This is important. Contact a health care provider if:  You are tired throughout the day.  You have trouble in your daily routine due to sleepiness.  You continue to have sleep problems, or your sleep problems get worse. Get help right away if:  You have serious thoughts about hurting yourself or someone else. If you ever feel like you may hurt yourself or others, or have thoughts about taking your own life, get help right away. You can go to your nearest emergency department or call:  Your local emergency services (911 in the [place]  A suicide crisis helpline, such as the [place] at (903) 020-8104. This is open 24 hours a day. Summary  Insomnia is a sleep disorder that makes it difficult to fall asleep or stay asleep.  Insomnia can be long-term (chronic) or short-term (acute).  Treatment for insomnia depends on the cause. Treatment may focus on treating an underlying condition that is causing insomnia.  Keep a sleep diary to help you and your health care provider figure out what could be causing your insomnia. This information is not intended to replace advice given to you by your health care provider. Make sure you discuss any questions you have with your health care provider. Document Revised: 05/25/2017 Document Reviewed: 03/22/2017 Elsevier Patient Education  2020 [employer].

## 2019-12-22 NOTE — Progress Notes (Signed)
Established Patient Office Visit  Subjective:  Patient ID: Amy Moon, female    DOB: 1951/06/15  Age: 69 y.o. MRN: [id number]  CC:  Chief Complaint  Patient presents with   Hypertension    HPI Amy Moon presents for follow-up on hypertension.  HTN: Pt is followed by cardiology and are evaluating dyspnea on exertion and atypical chest discomfort.  Patient denies palpitations or dizziness.  She does report some lower extremity swelling. Currently not on antihypertensive medication. Checks BP at home and readings average 150s-160s/80s.  Reports highest reading has been 172/98 and her best reading has been 138/82. Pt follows a low salt diet.  Insomnia: Reports she has had problems with sleeping for a long time. States her brain does not turn off. She has tried melatonin and listening to nature sounds which have been ineffective. She is interested on trying a medication to help with sleep.  Anxiety and depression: Reports increased personal stress. She helps take care of her great granddaughter who has a rare condition and requires comprehensive medical care, and not much research exists regarding her condition which is upsetting to her.  Past Medical History:  Diagnosis Date   Allergy    Anemia    Angina pectoris ([place])    Arthritis    Asthma    years ago   Bronchitis 06/03/2018   Cancer ([place])    squamous cell carcinoma, nose   Cataract    Chest pain 07/30/2017   Chronic kidney disease    possibly per pt   Chronic pain syndrome 07/30/2017   Cirrhosis ([place])    Clotting disorder ([place])    platelets are low   Depression    Drug-induced low platelet count    Duodenal ulcer    Dysphagia 09/03/2017   Dyspnea on exertion 10/17/2019   Encounter for hepatitis C virus screening test for high risk patient 11/05/2017   Encounter for Medicare annual wellness exam 05/09/2018   Enlarged thyroid    Family history of adverse reaction to anesthesia    father had  difficulty waking up and breathing on his own after surgery   GERD (gastroesophageal reflux disease)    Granulomatous disease ([place])    Healthcare maintenance 07/30/2017   Hepatic disease    Hiatal hernia    History of exposure to asbestos 10/21/2019   History of kidney stones    History of smoking 25-50 pack years- quit age 83, smoked 2ppd from 69 yo-69 yo. 10/21/2019   History of surgery- pt has several Sx- no personal issues wiht anesthesia  10/21/2019   Hypertension    Irritant contact dermatitis 08/25/2015   Low vitamin D level    Nausea 03/01/2016   Neuropathy    Osteopenia    Osteoporosis    Parkinson's disease ([place])    Pneumonia    Portal hypertension ([place])    Severe episode of recurrent major depressive disorder, without psychotic features ([place]) 05/15/2016   Severe obesity (BMI >= 40) ([place]) 05/15/2016   Sleep disturbance 10/21/2019   Splenomegaly a   Stage 3 chronic kidney disease 10/21/2019   Thrombocytopenia ([place]) 09/03/2017   Tremor of both hands 05/15/2016   Vitamin D deficiency 11/05/2017    Past Surgical History:  Procedure Laterality Date   ABDOMINAL HYSTERECTOMY     APPENDECTOMY     BACK SURGERY     thoracic-fractured    BACK SURGERY     lumbar - fractured   BREAST LUMPECTOMY WITH RADIOACTIVE SEED LOCALIZATION Right  08/01/2018   Procedure: RADIOACTIVE SEED GUIDED RIGHT BREAST LUMPECTOMY;  Surgeon: Coralie Keens, MD;  Location: [facility];  Service: General;  Laterality: Right;   ESOPHAGOGASTRODUODENOSCOPY  05/20/2008   Mild to moderate esophagitis. Otherwise normal EGD.    EYE SURGERY Left    cataract surgery with lens implant   GASTROSTOMY     peptic ulcer - peg tube placement    Family History  Problem Relation Age of Onset   Cancer Mother        ovarian   Hyperlipidemia Mother    Alzheimer's disease Mother    Arthritis Mother        rheumatoid   Cancer Father        lung   Parkinson's disease Father    Alzheimer's  disease Father    ALS Sister    Cancer Maternal Aunt        pancreatic, lung, liver,breast   Breast cancer Maternal Aunt    Cancer Maternal Uncle        pancreatic, liver, brain   Colon cancer Maternal Uncle    Heart attack Paternal Aunt    Stroke Paternal Aunt    Stomach cancer Paternal Aunt    Heart attack Paternal Uncle    Stroke Paternal Grandmother    Heart attack Paternal Grandfather    Arthritis Sister        rheumatoid   [place]'s disease Daughter        presumed inherited from father per patient   Colon cancer Maternal Uncle    Pancreatic cancer Maternal Uncle    Liver disease Maternal Uncle    Breast cancer Maternal Aunt    Breast cancer Maternal Aunt    Esophageal cancer Neg Hx    Rectal cancer Neg Hx     Social History   Socioeconomic History   Marital status: Married    Spouse name: Not on file   Number of children: 3   Years of education: Not on file   Highest education level: Not on file  Occupational History   Not on file  Tobacco Use   Smoking status: Former Smoker    Packs/day: 1.00    Years: 22.00    Pack years: 22.00    Types: Cigarettes    Quit date: 06/26/1986    Years since quitting: 33.5   Smokeless tobacco: Never Used  Vaping Use   Vaping Use: Never used  Substance and Sexual Activity   Alcohol use: No   Drug use: No   Sexual activity: Not Currently    Birth control/protection: None  Other Topics Concern   Not on file  Social History Narrative   Not on file   Social Determinants of Health   Financial Resource Strain:    Difficulty of Paying Living Expenses:   Food Insecurity:    Worried About Charity fundraiser in the Last Year:    Arboriculturist in the Last Year:   Transportation Needs:    Film/video editor (Medical):    Lack of Transportation (Non-Medical):   Physical Activity:    Days of Exercise per Week:    Minutes of Exercise per Session:   Stress:    Feeling of  Stress :   Social Connections:    Frequency of Communication with Friends and Family:    Frequency of Social Gatherings with Friends and Family:    Attends Religious Services:    Active Member of Clubs or Organizations:    Attends  Archivist Meetings:    Marital Status:   Intimate Partner Violence:    Fear of Current or Ex-Partner:    Emotionally Abused:    Physically Abused:    Sexually Abused:     Outpatient Medications Prior to Visit  Medication Sig Dispense Refill   acetaminophen (TYLENOL) 650 MG CR tablet Take 650 mg by mouth every 6 (six) hours as needed for pain.     bisacodyl (DULCOLAX) 5 MG EC tablet Take 15 mg by mouth at bedtime as needed for moderate constipation.     clopidogrel (PLAVIX) 75 MG tablet Take 1 tablet (75 mg total) by mouth daily. 90 tablet 2   cyclobenzaprine (FLEXERIL) 10 MG tablet TAKE 1 TABLET BY MOUTH EVERY 8 HOURS AS NEEDED 30 tablet 0   diphenhydrAMINE (BENADRYL) 25 MG tablet Take 25 mg by mouth every 6 (six) hours as needed.     fluticasone (CUTIVATE) 0.05 % cream Apply topically 2 (two) times daily. Use for 10 days. 30 g 2   gabapentin (NEURONTIN) 300 MG capsule Take 2 capsules (600 mg total) by mouth at bedtime AND 1 capsule (300 mg total) 2 (two) times daily. 120 capsule 6   nitroGLYCERIN (NITROSTAT) 0.4 MG SL tablet Place 0.4 mg under the tongue every 5 (five) minutes as needed for chest pain.      pantoprazole (PROTONIX) 40 MG tablet Take 1 tablet (40 mg total) by mouth daily. 30 tablet 6   predniSONE (DELTASONE) 50 MG tablet Take 50 mg13 hours before, 50 mg 7 hrs before, and 50 mg 1 hour before scan 3 tablet 0   ranolazine (RANEXA) 500 MG 12 hr tablet Take 1 tablet (500 mg total) by mouth 2 (two) times daily. 180 tablet 2   Vitamin D, Ergocalciferol, (DRISDOL) 1.25 MG (50000 UT) CAPS capsule Take 1 capsule (50,000 Units total) by mouth every 7 (seven) days. 16 capsule 0   HYDROcodone-acetaminophen (NORCO/VICODIN)  5-325 MG tablet TAKE 1 TABLET BY MOUTH EVERY 4 HOURS AS NEEDED FOR UP TO 5 DAYS FOR MODERATE PAIN. 30 tablet 0   promethazine (PHENERGAN) 25 MG tablet TAKE 1 TABLET BY MOUTH EVERY 6 HOURS AS NEEDED FOR NAUSEA OR VOMITING. 30 tablet 2   Facility-Administered Medications Prior to Visit  Medication Dose Route Frequency Provider Last Rate Last Admin   bupivacaine (MARCAINE) 0.25 % (with pres) injection 2 mL  2 mL Other Once Magnus Sinning, MD        Allergies  Allergen Reactions   Iodinated Diagnostic Agents Hives   Nsaids Other (See Comments)    History of bleeding ulcers   Latex Hives and Rash   Sinemet [Carbidopa W-Levodopa] Nausea And Vomiting   Inderal [Propranolol] Other (See Comments)    Unknown   Indomethacin Other (See Comments)    Unknown   Penicillin G Other (See Comments)    Unknown   Pneumococcal Vaccines Other (See Comments)    Caused "pneumonia"   Scopolamine Other (See Comments)    The patch, caused her to pass out / change in mental status   Tape Other (See Comments)    Unknown    ROS Review of Systems A fourteen system review of systems was performed and found to be positive as per HPI.    Objective:    Physical Exam General:  Well Developed, well nourished, appropriate for stated age.  Neuro:  Alert and oriented,  extra-ocular muscles intact, no focal deficits  HEENT:  Normocephalic, atraumatic, neck supple Skin:  no gross rash, warm, pink. Cardiac:  RRR, S1 S2 w/o murmur Respiratory:  ECTA B/L, Not using accessory muscles, speaking in full sentences- unlabored. Vascular:  Ext warm, no cyanosis apprec., no gross edema Psych:  No HI/SI, judgement and insight good, Euthymic mood. Full Affect.   BP 109/73    Pulse (!) 105    Temp 98 F (36.7 C) (Oral)    Ht 5' (1.524 m)    Wt 168 lb 8 oz (76.4 kg)    SpO2 95%    BMI 32.91 kg/m  Wt Readings from Last 3 Encounters:  12/22/19 168 lb 8 oz (76.4 kg)  12/08/19 162 lb (73.5 kg)  12/03/19 160 lb  (72.6 kg)     Health Maintenance Due  Topic Date Due   COVID-19 Vaccine (1) Never done   TETANUS/TDAP  Never done    There are no preventive care reminders to display for this patient.  Lab Results  Component Value Date   TSH 0.254 (L) 06/18/2019   Lab Results  Component Value Date   WBC 4.9 09/17/2019   HGB 13.0 09/17/2019   HCT 38.2 09/17/2019   MCV 95.4 09/17/2019   PLT 137.0 (L) 09/17/2019   Lab Results  Component Value Date   NA 139 10/17/2019   K 3.9 10/17/2019   CO2 25 10/17/2019   GLUCOSE 85 10/17/2019   BUN 9 12/17/2019   CREATININE 0.68 12/17/2019   BILITOT 1.0 09/17/2019   ALKPHOS 80 09/17/2019   AST 13 09/17/2019   ALT 12 09/17/2019   PROT 7.2 09/17/2019   ALBUMIN 4.3 09/17/2019   CALCIUM 9.5 10/17/2019   ANIONGAP 8 07/25/2018   GFR 86.72 09/17/2019   Lab Results  Component Value Date   CHOL 191 06/18/2019   Lab Results  Component Value Date   HDL 54 06/18/2019   Lab Results  Component Value Date   LDLCALC 121 (H) 06/18/2019   Lab Results  Component Value Date   TRIG 87 06/18/2019   Lab Results  Component Value Date   CHOLHDL 3.5 06/18/2019   Lab Results  Component Value Date   HGBA1C 4.8 06/18/2019      Assessment & Plan:   Problem List Items Addressed This Visit      Cardiovascular and Mediastinum   Hypertension - Primary   Relevant Medications   losartan (COZAAR) 50 MG tablet     Other   Dyspnea on exertion    Other Visit Diagnoses    Insomnia, unspecified type       Relevant Medications   traZODone (DESYREL) 50 MG tablet   Anxiety and depression       Relevant Medications   traZODone (DESYREL) 50 MG tablet   sertraline (ZOLOFT) 25 MG tablet   Exertional chest pressure/tightness with activity, relieved with rest         HTN: - BP today is 142/81 HR 105, recheck BP 109/73- improved. - Discussed with patient starting low-dose antihypertensive given her ambulatory BP monitoring remains above goal. Patient  agreeable to starting losartan 50 mg. - Continue ambulatory BP and pulse monitoring and keep a log. Notify the clinic if BP consistently > 140/90 or < 90/60. - Continue DASH diet. - Stay well hydrated, at least 64 fl oz - Encourage to stay as active as possible. -Follow-up in 8 weeks to reassess blood pressure and medication therapy.  Insomnia: -Started trazodone 50 mg and can take 0.5-1 tablet at bedtime as needed for sleep. -Discussed with patient  incorporating nonpharmacological therapies such as light exercise and relaxation techniques. -Follow-up in 8 weeks to reassess insomnia and medication therapy.  Anxiety and depression: -PHQ-9 score of 9 and GAD-7 score of 12, denies SI/HI -Discussed with patient management options including medications and referral to psychology/psychiatry. Patient prefers to start medication therapy first.  Declined referral to psychology/psychiatry at this time. -Started sertraline 25 mg. Discussed with patient gradually increasing the dose until therapeutic dose is reached and recommended to let me know if 25 mg not effective after taking it for 4 to 6 weeks. -Follow-up in 8 weeks to reassess symptoms and medication therapy.  Dyspnea on exertion, exertional chest pressure/tightness with activity relieved with rest: -Followed by cardiology. -Recent echo revealed normal left ventricle ejection fraction with mild left ventricle hypertrophy, otherwise WNL -Patient has upcoming CT angiogram to evaluate for CAD. -Dr. Zerita Boers recommends pulmonology referral if cardiac work-up is negative.  Meds ordered this encounter  Medications   traZODone (DESYREL) 50 MG tablet    Sig: Take 0.5-1 tablets (25-50 mg total) by mouth at bedtime as needed for sleep.    Dispense:  30 tablet    Refill:  3    Order Specific Question:   Supervising Provider    Answer:   Beatrice Lecher D [[id number]]   sertraline (ZOLOFT) 25 MG tablet    Sig: Take 1 tablet (25 mg total) by mouth  daily.    Dispense:  30 tablet    Refill:  2    Order Specific Question:   Supervising Provider    Answer:   Beatrice Lecher D [[id number]]   losartan (COZAAR) 50 MG tablet    Sig: Take 1 tablet (50 mg total) by mouth daily.    Dispense:  90 tablet    Refill:  3    Order Specific Question:   Supervising Provider    Answer:   Beatrice Lecher D [[id number]]    Follow-up: Return for Mood- started new med, Insomnia- started new med, HTN in 8 weeks.   Note:  This note was prepared with assistance of Dragon voice recognition software. Occasional wrong-word or sound-a-like substitutions may have occurred due to the inherent limitations of voice recognition software.   Lorrene Reid, PA-C

## 2019-12-25 ENCOUNTER — Other Ambulatory Visit: Payer: Self-pay | Admitting: Specialist

## 2019-12-25 ENCOUNTER — Other Ambulatory Visit: Payer: Self-pay

## 2019-12-25 ENCOUNTER — Ambulatory Visit (HOSPITAL_BASED_OUTPATIENT_CLINIC_OR_DEPARTMENT_OTHER)
Admission: RE | Admit: 2019-12-25 | Discharge: 2019-12-25 | Disposition: A | Discharge status: Home / Self Care | Payer: Medicare Other | Source: Ambulatory Visit | Attending: Gastroenterology | Admitting: Gastroenterology

## 2019-12-25 DIAGNOSIS — R112 Nausea with vomiting, unspecified: Secondary | ICD-10-CM | POA: Diagnosis not present

## 2019-12-25 DIAGNOSIS — R1110 Vomiting, unspecified: Secondary | ICD-10-CM | POA: Diagnosis not present

## 2019-12-25 DIAGNOSIS — R1011 Right upper quadrant pain: Secondary | ICD-10-CM | POA: Diagnosis not present

## 2019-12-25 MED ORDER — IOHEXOL 300 MG/ML  SOLN
100.0000 mL | Freq: Once | INTRAMUSCULAR | Status: AC | PRN
  Administered 2019-12-25: 100 mL via INTRAVENOUS

## 2019-12-30 ENCOUNTER — Other Ambulatory Visit: Payer: Self-pay | Admitting: Gastroenterology

## 2019-12-30 DIAGNOSIS — R110 Nausea: Secondary | ICD-10-CM

## 2020-01-05 ENCOUNTER — Other Ambulatory Visit: Payer: Self-pay | Admitting: Specialist

## 2020-01-08 ENCOUNTER — Other Ambulatory Visit: Payer: Self-pay | Admitting: Specialist

## 2020-01-19 ENCOUNTER — Other Ambulatory Visit: Payer: Self-pay | Admitting: Specialist

## 2020-01-19 NOTE — Telephone Encounter (Signed)
Please advise 

## 2020-01-28 ENCOUNTER — Other Ambulatory Visit: Payer: Self-pay | Admitting: Specialist

## 2020-01-30 ENCOUNTER — Telehealth: Payer: Self-pay | Admitting: Specialist

## 2020-01-30 NOTE — Telephone Encounter (Signed)
Patient called requesting a call back from [facility]. Patient did not disclose nature of her call. Please call patient at [phone].

## 2020-02-02 NOTE — Telephone Encounter (Signed)
Patient is calling to check the status of her medication refill, I advised that the request has been sent to Dr. Louanne Skye.

## 2020-02-10 ENCOUNTER — Other Ambulatory Visit: Payer: Self-pay | Admitting: Gastroenterology

## 2020-02-10 DIAGNOSIS — R110 Nausea: Secondary | ICD-10-CM

## 2020-02-11 ENCOUNTER — Telehealth: Payer: Self-pay | Admitting: Gastroenterology

## 2020-02-11 ENCOUNTER — Telehealth: Payer: Self-pay | Admitting: Cardiology

## 2020-02-11 ENCOUNTER — Other Ambulatory Visit: Payer: Self-pay

## 2020-02-11 DIAGNOSIS — R110 Nausea: Secondary | ICD-10-CM

## 2020-02-11 MED ORDER — PROMETHAZINE HCL 25 MG PO TABS: ORAL_TABLET | ORAL | 0 refills | Status: DC

## 2020-02-11 NOTE — Telephone Encounter (Signed)
Prescription sent to pharmacy.

## 2020-02-11 NOTE — Telephone Encounter (Signed)
Patient said that at her last appointment with Dr. Agustin Cree, she states he mentioned that he was waiting to hear back from insurance to do something, she isn't sure what it was that he was waiting on but states she has not heard anything else about it. Please call.

## 2020-02-12 ENCOUNTER — Telehealth: Payer: Self-pay

## 2020-02-12 ENCOUNTER — Ambulatory Visit: Payer: Medicare Other | Admitting: Specialist

## 2020-02-12 NOTE — Telephone Encounter (Signed)
Spoke with patient. She has been rescheduled to [facility] next week.  She is requesting a refill of ... Flexeril 10mg   #30   1 tablet q 8hr Last filled 02/02/2020  Hydrocodone 5/325 #30   1 q 4hr Last filled 02/02/2020  Pharmacy on file [facility]

## 2020-02-12 NOTE — Telephone Encounter (Signed)
Patient would like a call concerning her appointment she missed today.  She did R/S for next Thursday, 02/19/2020 with Jeneen Rinks.  CB# [phone].  Please advise.  Thank you.

## 2020-02-13 ENCOUNTER — Other Ambulatory Visit: Payer: Self-pay | Admitting: Specialist

## 2020-02-13 NOTE — Telephone Encounter (Signed)
Pls advise.  

## 2020-02-18 NOTE — Telephone Encounter (Signed)
Called patient. Informed her that her ct will be scheduled soon as the insurance has approved it, I have confirmed this. She verbally understood. No further questions.

## 2020-02-19 ENCOUNTER — Ambulatory Visit: Payer: Medicare Other | Admitting: Surgery

## 2020-02-20 ENCOUNTER — Other Ambulatory Visit: Payer: Self-pay | Admitting: Physician Assistant

## 2020-02-20 DIAGNOSIS — E559 Vitamin D deficiency, unspecified: Secondary | ICD-10-CM

## 2020-02-23 ENCOUNTER — Other Ambulatory Visit: Payer: Medicare Other

## 2020-02-23 ENCOUNTER — Encounter: Payer: Self-pay | Admitting: Cardiology

## 2020-02-23 ENCOUNTER — Ambulatory Visit (INDEPENDENT_AMBULATORY_CARE_PROVIDER_SITE_OTHER): Payer: Medicare Other | Admitting: Cardiology

## 2020-02-23 ENCOUNTER — Telehealth: Payer: Self-pay | Admitting: Physician Assistant

## 2020-02-23 ENCOUNTER — Other Ambulatory Visit: Payer: Self-pay

## 2020-02-23 ENCOUNTER — Ambulatory Visit: Payer: Medicare Other | Admitting: Physician Assistant

## 2020-02-23 ENCOUNTER — Other Ambulatory Visit: Payer: Self-pay | Admitting: Specialist

## 2020-02-23 VITALS — BP 102/80 | HR 100 | Ht 60.0 in | Wt 158.0 lb

## 2020-02-23 DIAGNOSIS — F419 Anxiety disorder, unspecified: Secondary | ICD-10-CM

## 2020-02-23 DIAGNOSIS — N183 Chronic kidney disease, stage 3 unspecified: Secondary | ICD-10-CM

## 2020-02-23 DIAGNOSIS — Z77090 Contact with and (suspected) exposure to asbestos: Secondary | ICD-10-CM | POA: Diagnosis not present

## 2020-02-23 DIAGNOSIS — R0600 Dyspnea, unspecified: Secondary | ICD-10-CM

## 2020-02-23 DIAGNOSIS — Z87891 Personal history of nicotine dependence: Secondary | ICD-10-CM

## 2020-02-23 DIAGNOSIS — R0609 Other forms of dyspnea: Secondary | ICD-10-CM

## 2020-02-23 DIAGNOSIS — G4700 Insomnia, unspecified: Secondary | ICD-10-CM

## 2020-02-23 MED ORDER — SERTRALINE HCL 25 MG PO TABS: 25.0000 mg | ORAL_TABLET | Freq: Every day | ORAL | 0 refills | Status: DC

## 2020-02-23 MED ORDER — TRAZODONE HCL 50 MG PO TABS: 25.0000 mg | ORAL_TABLET | Freq: Every evening | ORAL | 0 refills | Status: DC | PRN

## 2020-02-23 MED ORDER — FUROSEMIDE 20 MG PO TABS: 20.0000 mg | ORAL_TABLET | Freq: Every day | ORAL | 1 refills | Status: DC

## 2020-02-23 NOTE — Progress Notes (Signed)
Cardiology Office Note:    Date:  02/23/2020   ID:  Amy Moon, DOB 1950-09-24, MRN [id number]  PCP:  Lorrene Reid, PA-C  Cardiologist:  Jenne Campus, MD    Referring MD: No ref. provider found   Chief Complaint  Patient presents with  . Follow-up  Still having the same problem some shortness of breath.  History of Present Illness:    Amy Moon is a 70 y.o. female with past medical history significant for essential hypertension, history of asbestos exposure, was referred to [place] because of dyspnea on exertion. Because of multiple risk factors for coronary artery disease I'll schedule her to have coronary CT angio, however it took forever for insurance company to approve the test, finally we have that approval awaiting for test to be scheduled. Since that time she is doing the same complain of having some exertional shortness of breath she does have some smoking as well as asbestos exposure which could partially explain the symptoms but still concerns about potentially having coronary artery disease.  Past Medical History:  Diagnosis Date  . Allergy   . Anemia   . Angina pectoris ([place])   . Arthritis   . Asthma    years ago  . Bronchitis 06/03/2018  . Cancer (HCC)    squamous cell carcinoma, nose  . Cataract   . Chest pain 07/30/2017  . Chronic kidney disease    possibly per pt  . Chronic pain syndrome 07/30/2017  . Cirrhosis ([place])   . Clotting disorder (HCC)    platelets are low  . Depression   . Drug-induced low platelet count   . Duodenal ulcer   . Dysphagia 09/03/2017  . Dyspnea on exertion 10/17/2019  . Encounter for hepatitis C virus screening test for high risk patient 11/05/2017  . Encounter for Medicare annual wellness exam 05/09/2018  . Enlarged thyroid   . Family history of adverse reaction to anesthesia    father had difficulty waking up and breathing on his own after surgery  . GERD (gastroesophageal reflux disease)   . Granulomatous disease ([place])   .  Healthcare maintenance 07/30/2017  . Hepatic disease   . Hiatal hernia   . History of exposure to asbestos 10/21/2019  . History of kidney stones   . History of smoking 25-50 pack years- quit age 75, smoked 2ppd from 69 yo-69 yo. 10/21/2019  . History of surgery- pt has several Sx- no personal issues wiht anesthesia  10/21/2019  . Hypertension   . Irritant contact dermatitis 08/25/2015  . Low vitamin D level   . Nausea 03/01/2016  . Neuropathy   . Osteopenia   . Osteoporosis   . Parkinson's disease ([place])   . Pneumonia   . Portal hypertension ([place])   . Severe episode of recurrent major depressive disorder, without psychotic features ([place]) 05/15/2016  . Severe obesity (BMI >= 40) ([place]) 05/15/2016  . Sleep disturbance 10/21/2019  . Splenomegaly a  . Stage 3 chronic kidney disease 10/21/2019  . Thrombocytopenia ([place]) 09/03/2017  . Tremor of both hands 05/15/2016  . Vitamin D deficiency 11/05/2017    Past Surgical History:  Procedure Laterality Date  . ABDOMINAL HYSTERECTOMY    . APPENDECTOMY    . BACK SURGERY     thoracic-fractured   . BACK SURGERY     lumbar - fractured  . BREAST LUMPECTOMY WITH RADIOACTIVE SEED LOCALIZATION Right 08/01/2018   Procedure: RADIOACTIVE SEED GUIDED RIGHT BREAST LUMPECTOMY;  Surgeon: Coralie Keens, MD;  Location:  [facility] OR;  Service: General;  Laterality: Right;  . ESOPHAGOGASTRODUODENOSCOPY  05/20/2008   Mild to moderate esophagitis. Otherwise normal EGD.   Marland Kitchen EYE SURGERY Left    cataract surgery with lens implant  . GASTROSTOMY     peptic ulcer - peg tube placement    Current Medications: Current Meds  Medication Sig  . acetaminophen (TYLENOL) 650 MG CR tablet Take 650 mg by mouth every 6 (six) hours as needed for pain.  . bisacodyl (DULCOLAX) 5 MG EC tablet Take 15 mg by mouth at bedtime as needed for moderate constipation.  . clopidogrel (PLAVIX) 75 MG tablet Take 1 tablet (75 mg total) by mouth daily.  . cyclobenzaprine (FLEXERIL) 10 MG tablet TAKE 1  TABLET BY MOUTH EVERY 8 HOURS AS NEEDED  . diphenhydrAMINE (BENADRYL) 25 MG tablet Take 25 mg by mouth every 6 (six) hours as needed.  . fluticasone (CUTIVATE) 0.05 % cream Apply topically 2 (two) times daily. Use for 10 days.  Marland Kitchen gabapentin (NEURONTIN) 300 MG capsule Take 2 capsules (600 mg total) by mouth at bedtime AND 1 capsule (300 mg total) 2 (two) times daily.  Marland Kitchen HYDROcodone-acetaminophen (NORCO/VICODIN) 5-325 MG tablet TAKE 1 TABLET BY MOUTH EVERY 4 HOURS AS NEEDED FOR UP TO 5 DAYS FOR MODERATE PAIN.  Marland Kitchen losartan (COZAAR) 50 MG tablet Take 1 tablet (50 mg total) by mouth daily.  . nitroGLYCERIN (NITROSTAT) 0.4 MG SL tablet Place 0.4 mg under the tongue every 5 (five) minutes as needed for chest pain.   . pantoprazole (PROTONIX) 40 MG tablet Take 1 tablet (40 mg total) by mouth daily.  . promethazine (PHENERGAN) 25 MG tablet TAKE 1 TABLET BY MOUTH EVERY 6 HOURS AS NEEDED FOR NAUSEA OR VOMITING.  . ranolazine (RANEXA) 500 MG 12 hr tablet Take 1 tablet (500 mg total) by mouth 2 (two) times daily.  . sertraline (ZOLOFT) 25 MG tablet Take 1 tablet (25 mg total) by mouth daily. **PATIENT NEEDS APT FOR FURTHER REFILLS**  . traZODone (DESYREL) 50 MG tablet Take 0.5-1 tablets (25-50 mg total) by mouth at bedtime as needed for sleep. **PATIENT NEEDS APT FOR FURTHER REFILLS**  . Vitamin D, Ergocalciferol, (DRISDOL) 1.25 MG (50000 UT) CAPS capsule Take 1 capsule (50,000 Units total) by mouth every 7 (seven) days.   Current Facility-Administered Medications for the 02/23/20 encounter (Office Visit) with Park Liter, MD  Medication  . bupivacaine (MARCAINE) 0.25 % (with pres) injection 2 mL     Allergies:   Iodinated diagnostic agents, Nsaids, Latex, Sinemet [carbidopa w-levodopa], Inderal [propranolol], Indomethacin, Penicillin g, Pneumococcal vaccines, Scopolamine, and Tape   Social History   Socioeconomic History  . Marital status: Married    Spouse name: Not on file  . Number of  children: 3  . Years of education: Not on file  . Highest education level: Not on file  Occupational History  . Not on file  Tobacco Use  . Smoking status: Former Smoker    Packs/day: 1.00    Years: 22.00    Pack years: 22.00    Types: Cigarettes    Quit date: 06/26/1986    Years since quitting: 33.6  . Smokeless tobacco: Never Used  Vaping Use  . Vaping Use: Never used  Substance and Sexual Activity  . Alcohol use: No  . Drug use: No  . Sexual activity: Not Currently    Birth control/protection: None  Other Topics Concern  . Not on file  Social History Narrative  . Not on  file   Social Determinants of Health   Financial Resource Strain:   . Difficulty of Paying Living Expenses: Not on file  Food Insecurity:   . Worried About Charity fundraiser in the Last Year: Not on file  . Ran Out of Food in the Last Year: Not on file  Transportation Needs:   . Lack of Transportation (Medical): Not on file  . Lack of Transportation (Non-Medical): Not on file  Physical Activity:   . Days of Exercise per Week: Not on file  . Minutes of Exercise per Session: Not on file  Stress:   . Feeling of Stress : Not on file  Social Connections:   . Frequency of Communication with Friends and Family: Not on file  . Frequency of Social Gatherings with Friends and Family: Not on file  . Attends Religious Services: Not on file  . Active Member of Clubs or Organizations: Not on file  . Attends Archivist Meetings: Not on file  . Marital Status: Not on file     Family History: The patient's family history includes ALS in her sister; Alzheimer's disease in her father and mother; Arthritis in her mother and sister; Breast cancer in her maternal aunt, maternal aunt, and maternal aunt; Cancer in her father, maternal aunt, maternal uncle, and mother; Colon cancer in her maternal uncle and maternal uncle; Heart attack in her paternal aunt, paternal grandfather, and paternal uncle; Huntington's  disease in her daughter; Hyperlipidemia in her mother; Liver disease in her maternal uncle; Pancreatic cancer in her maternal uncle; Parkinson's disease in her father; Stomach cancer in her paternal aunt; Stroke in her paternal aunt and paternal grandmother. There is no history of Esophageal cancer or Rectal cancer. ROS:   Please see the history of present illness.    All 14 point review of systems negative except as described per history of present illness  EKGs/Labs/Other Studies Reviewed:      Recent Labs: 06/18/2019: TSH 0.254 09/17/2019: ALT 12; Hemoglobin 13.0; Platelets 137.0 10/17/2019: NT-Pro BNP 33; Potassium 3.9; Sodium 139 12/17/2019: BUN 9; Creatinine, Ser 0.68  Recent Lipid Panel    Component Value Date/Time   CHOL 191 06/18/2019 1009   TRIG 87 06/18/2019 1009   HDL 54 06/18/2019 1009   CHOLHDL 3.5 06/18/2019 1009   LDLCALC 121 (H) 06/18/2019 1009    Physical Exam:    VS:  BP 102/80 (BP Location: Right Arm, Patient Position: Sitting, Cuff Size: Normal)   Pulse 100   Ht 5' (1.524 m)   Wt 158 lb (71.7 kg)   SpO2 95%   BMI 30.86 kg/m     Wt Readings from Last 3 Encounters:  02/23/20 158 lb (71.7 kg)  12/22/19 168 lb 8 oz (76.4 kg)  12/08/19 162 lb (73.5 kg)     GEN:  Well nourished, well developed in no acute distress HEENT: Normal NECK: No JVD; No carotid bruits LYMPHATICS: No lymphadenopathy CARDIAC: RRR, no murmurs, no rubs, no gallops RESPIRATORY:  Clear to auscultation without rales, wheezing or rhonchi  ABDOMEN: Soft, non-tender, non-distended MUSCULOSKELETAL:  No edema; No deformity  SKIN: Warm and dry LOWER EXTREMITIES: no swelling NEUROLOGIC:  Alert and oriented x 3 PSYCHIATRIC:  Normal affect   ASSESSMENT:    1. Dyspnea on exertion   2. History of smoking 25-50 pack years- quit age 49, smoked 2ppd from 69 yo-69 yo.   3. Stage 3 chronic kidney disease, unspecified whether stage 3a or 3b CKD   4.  History of exposure to asbestos    PLAN:     In order of problems listed above:  1. Dyspnea on exertion. Awaiting results of coronary CT angio. She did have echocardiogram done showing preserved left ventricle ejection fraction she did have mild degree of left ventricle hypertrophy, therefore, there is some degree of diastolic dysfunction. However, she did have proBNP done about 4 months ago which showed no evidence of elevation. 2. Smoking: Undoubtedly contributing to her shortness of breath. Will await coronary CT angio to rule out coronary artery disease or potential recent for her symptomatology. 3. Chronic kidney failure. Noted, stable. Last creatinine was normal. 4. Swelling of lower extremities with some shortness of breath I'll give her a small dose of diuretic to see if it helps with symptomatology thinking she may have transient diastolic dysfunction.   Medication Adjustments/Labs and Tests Ordered: Current medicines are reviewed at length with the patient today.  Concerns regarding medicines are outlined above.  No orders of the defined types were placed in this encounter.  Medication changes:  Meds ordered this encounter  Medications  . furosemide (LASIX) 20 MG tablet    Sig: Take 1 tablet (20 mg total) by mouth daily.    Dispense:  90 tablet    Refill:  1    Signed, Park Liter, MD, [facility] 02/23/2020 4:28 PM    [place]

## 2020-02-23 NOTE — Patient Instructions (Signed)
Medication Instructions:  Your physician has recommended you make the following change in your medication:   START: Lasix 20 mg daily  *If you need a refill on your cardiac medications before your next appointment, please call your pharmacy*   Lab Work: None.  If you have labs (blood work) drawn today and your tests are completely normal, you will receive your results only by: Marland Kitchen MyChart Message (if you have MyChart) OR . A paper copy in the mail If you have any lab test that is abnormal or we need to change your treatment, we will call you to review the results.   Testing/Procedures: None.    Follow-Up: At [facility], you and your health needs are our priority.  As part of our continuing mission to provide you with exceptional heart care, we have created designated Provider Care Teams.  These Care Teams include your primary Cardiologist (physician) and Advanced Practice Providers (APPs -  Physician Assistants and Nurse Practitioners) who all work together to provide you with the care you need, when you need it.  We recommend signing up for the patient portal called "MyChart".  Sign up information is provided on this After Visit Summary.  MyChart is used to connect with patients for Virtual Visits (Telemedicine).  Patients are able to view lab/test results, encounter notes, upcoming appointments, etc.  Non-urgent messages can be sent to your provider as well.   To learn more about what you can do with MyChart, go to [email or web address].    Your next appointment:   3 month(s)  The format for your next appointment:   In Person  Provider:   Jenne Campus, MD   Other Instructions

## 2020-02-23 NOTE — Addendum Note (Signed)
Addended by: Mickel Crow on: 02/23/2020 11:03 AM   Modules accepted: Orders

## 2020-02-23 NOTE — Telephone Encounter (Signed)
Refill sent to requested pharmacy. AS< CMA

## 2020-02-23 NOTE — Telephone Encounter (Signed)
Patient called in stating she needs a refill on zoloft and trazodone. Please send to [facility]. Thanks

## 2020-02-24 ENCOUNTER — Other Ambulatory Visit: Payer: Medicare Other

## 2020-02-26 ENCOUNTER — Telehealth: Payer: Self-pay | Admitting: Emergency Medicine

## 2020-02-26 MED ORDER — PREDNISONE 50 MG PO TABS: ORAL_TABLET | ORAL | 0 refills | Status: DC

## 2020-02-26 NOTE — Telephone Encounter (Signed)
.   If the patient has contrast allergy: ? Patient will need a prescription for Prednisone and very clear instructions (as follows): 1. Prednisone 50 mg - take 13 hours prior to test 2. Take another Prednisone 50 mg 7 hours prior to test 3. Take another Prednisone 50 mg 1 hour prior to test 4. Take Benadryl 50 mg 1 hour prior to test . Patient must complete all four doses of above prophylactic medications. . Patient will need a ride after test due to Benadryl.   Called patient went above instructions for cardiac ct. She will also have labs drawn next week. No further questions.

## 2020-03-02 ENCOUNTER — Other Ambulatory Visit: Payer: Self-pay | Admitting: Gastroenterology

## 2020-03-02 DIAGNOSIS — R110 Nausea: Secondary | ICD-10-CM

## 2020-03-04 ENCOUNTER — Ambulatory Visit: Payer: Medicare Other | Admitting: Specialist

## 2020-03-04 ENCOUNTER — Encounter: Payer: Self-pay | Admitting: Specialist

## 2020-03-04 ENCOUNTER — Other Ambulatory Visit: Payer: Self-pay

## 2020-03-04 VITALS — BP 137/80 | HR 103 | Ht 60.0 in | Wt 156.0 lb

## 2020-03-04 DIAGNOSIS — M19011 Primary osteoarthritis, right shoulder: Secondary | ICD-10-CM

## 2020-03-04 DIAGNOSIS — M5412 Radiculopathy, cervical region: Secondary | ICD-10-CM | POA: Diagnosis not present

## 2020-03-04 DIAGNOSIS — M1712 Unilateral primary osteoarthritis, left knee: Secondary | ICD-10-CM

## 2020-03-04 DIAGNOSIS — M4802 Spinal stenosis, cervical region: Secondary | ICD-10-CM | POA: Diagnosis not present

## 2020-03-04 DIAGNOSIS — M25561 Pain in right knee: Secondary | ICD-10-CM

## 2020-03-04 DIAGNOSIS — M25511 Pain in right shoulder: Secondary | ICD-10-CM | POA: Diagnosis not present

## 2020-03-04 DIAGNOSIS — M47812 Spondylosis without myelopathy or radiculopathy, cervical region: Secondary | ICD-10-CM | POA: Diagnosis not present

## 2020-03-04 DIAGNOSIS — M25461 Effusion, right knee: Secondary | ICD-10-CM

## 2020-03-04 MED ORDER — HYDROCODONE-ACETAMINOPHEN 5-325 MG PO TABS
ORAL_TABLET | ORAL | 0 refills | Status: DC
Start: 2020-03-04 — End: 2020-03-12

## 2020-03-04 NOTE — Progress Notes (Signed)
Office Visit Note   Patient: Amy Moon           Date of Birth: [birth year]           MRN: [id number] Visit Date: 03/04/2020              Requested by: Lorrene Reid, PA-C [address],  [place] [address] PCP: Lorrene Reid, PA-C   Assessment & Plan: Visit Diagnoses:  1. Cervical radiculopathy at C5   2. Right shoulder pain, unspecified chronicity   3. Spinal stenosis of cervical region   4. Spondylosis without myelopathy or radiculopathy, cervical region   5. Right knee pain, unspecified chronicity   6. Effusion, right knee   7. Unilateral primary osteoarthritis, left knee   8. Primary osteoarthritis, right shoulder     Plan: Avoid overhead lifting and overhead use of the arms. Do not lift greater than 5-10 lbs. Adjust head rest in vehicle to prevent hyperextension if rear ended. Take extra precautions to avoid falling. Unable to take NSAIDs due to history of CKD stage 3, last Cr normal so will continue use of narcotic. EMG/NCV ordered due to worsening right arm weakness and   Follow-Up Instructions: Return in about 3 weeks (around 03/25/2020).   Orders:  Orders Placed This Encounter  Procedures  . Ambulatory referral to Physical Medicine Rehab   Meds ordered this encounter  Medications  . HYDROcodone-acetaminophen (NORCO/VICODIN) 5-325 MG tablet    Sig: TAKE 1 TABLET BY MOUTH EVERY 4 HOURS AS NEEDED FOR UP TO 5 DAYS FOR MODERATE PAIN.    Dispense:  30 tablet    Refill:  0      Procedures: No procedures performed   Clinical Data: No additional findings.   Subjective: Chief Complaint  Patient presents with  . Neck - Pain  . Left Knee - Pain    HPI  Review of Systems  Constitutional: Negative.   HENT: Negative.   Eyes: Negative.   Respiratory: Negative.   Cardiovascular: Negative.   Gastrointestinal: Negative.   Endocrine: Negative.   Genitourinary: Negative.   Musculoskeletal: Negative.   Skin: Negative.     Allergic/Immunologic: Negative.   Neurological: Negative.   Hematological: Negative.   Psychiatric/Behavioral: Negative.      Objective: Vital Signs: BP 137/80 (BP Location: Left Arm, Patient Position: Sitting)   Pulse (!) 103   Ht 5' (1.524 m)   Wt 156 lb (70.8 kg)   BMI 30.47 kg/m   Physical Exam  Ortho Exam  Specialty Comments:  No specialty comments available.  Imaging: No results found.   PMFS History: Patient Active Problem List   Diagnosis Date Noted  . Stage 3 chronic kidney disease 10/21/2019  . History of smoking 25-50 pack years- quit age 65, smoked 2ppd from 69 yo-69 yo. 10/21/2019  . History of surgery- pt has several Sx- no personal issues wiht anesthesia  10/21/2019  . Sleep disturbance 10/21/2019  . History of exposure to asbestos 10/21/2019  . Family history of adverse reaction to anesthesia   . Dyspnea on exertion 10/17/2019  . Bronchitis 06/03/2018  . Encounter for Medicare annual wellness exam 05/09/2018  . Vitamin D deficiency 11/05/2017  . Encounter for hepatitis C virus screening test for high risk patient 11/05/2017  . Dysphagia 09/03/2017  . Thrombocytopenia ([place]) 09/03/2017  . Healthcare maintenance 07/30/2017  . Chronic pain syndrome 07/30/2017  . Chest pain 07/30/2017  . Parkinson's disease ([place]) 07/30/2017  . Hypertension 07/30/2017  .  Severe obesity (BMI >= 40) ([place]) 05/15/2016  . Severe episode of recurrent major depressive disorder, without psychotic features ([place]) 05/15/2016  . Tremor of both hands 05/15/2016  . Nausea 03/01/2016  . Irritant contact dermatitis 08/25/2015   Past Medical History:  Diagnosis Date  . Allergy   . Anemia   . Angina pectoris ([place])   . Arthritis   . Asthma    years ago  . Bronchitis 06/03/2018  . Cancer (HCC)    squamous cell carcinoma, nose  . Cataract   . Chest pain 07/30/2017  . Chronic kidney disease    possibly per pt  . Chronic pain syndrome 07/30/2017  . Cirrhosis ([place])   . Clotting  disorder (HCC)    platelets are low  . Depression   . Drug-induced low platelet count   . Duodenal ulcer   . Dysphagia 09/03/2017  . Dyspnea on exertion 10/17/2019  . Encounter for hepatitis C virus screening test for high risk patient 11/05/2017  . Encounter for Medicare annual wellness exam 05/09/2018  . Enlarged thyroid   . Family history of adverse reaction to anesthesia    father had difficulty waking up and breathing on his own after surgery  . GERD (gastroesophageal reflux disease)   . Granulomatous disease ([place])   . Healthcare maintenance 07/30/2017  . Hepatic disease   . Hiatal hernia   . History of exposure to asbestos 10/21/2019  . History of kidney stones   . History of smoking 25-50 pack years- quit age 56, smoked 2ppd from 69 yo-69 yo. 10/21/2019  . History of surgery- pt has several Sx- no personal issues wiht anesthesia  10/21/2019  . Hypertension   . Irritant contact dermatitis 08/25/2015  . Low vitamin D level   . Nausea 03/01/2016  . Neuropathy   . Osteopenia   . Osteoporosis   . Parkinson's disease ([place])   . Pneumonia   . Portal hypertension ([place])   . Severe episode of recurrent major depressive disorder, without psychotic features ([place]) 05/15/2016  . Severe obesity (BMI >= 40) ([place]) 05/15/2016  . Sleep disturbance 10/21/2019  . Splenomegaly a  . Stage 3 chronic kidney disease 10/21/2019  . Thrombocytopenia ([place]) 09/03/2017  . Tremor of both hands 05/15/2016  . Vitamin D deficiency 11/05/2017    Family History  Problem Relation Age of Onset  . Cancer Mother        ovarian  . Hyperlipidemia Mother   . Alzheimer's disease Mother   . Arthritis Mother        rheumatoid  . Cancer Father        lung  . Parkinson's disease Father   . Alzheimer's disease Father   . ALS Sister   . Cancer Maternal Aunt        pancreatic, lung, liver,breast  . Breast cancer Maternal Aunt   . Cancer Maternal Uncle        pancreatic, liver, brain  . Colon cancer Maternal Uncle   .  Heart attack Paternal Aunt   . Stroke Paternal Aunt   . Stomach cancer Paternal Aunt   . Heart attack Paternal Uncle   . Stroke Paternal Grandmother   . Heart attack Paternal Grandfather   . Arthritis Sister        rheumatoid  . Huntington's disease Daughter        presumed inherited from father per patient  . Colon cancer Maternal Uncle   . Pancreatic cancer Maternal Uncle   . Liver disease  Maternal Uncle   . Breast cancer Maternal Aunt   . Breast cancer Maternal Aunt   . Esophageal cancer Neg Hx   . Rectal cancer Neg Hx     Past Surgical History:  Procedure Laterality Date  . ABDOMINAL HYSTERECTOMY    . APPENDECTOMY    . BACK SURGERY     thoracic-fractured   . BACK SURGERY     lumbar - fractured  . BREAST LUMPECTOMY WITH RADIOACTIVE SEED LOCALIZATION Right 08/01/2018   Procedure: RADIOACTIVE SEED GUIDED RIGHT BREAST LUMPECTOMY;  Surgeon: Coralie Keens, MD;  Location: [facility];  Service: General;  Laterality: Right;  . ESOPHAGOGASTRODUODENOSCOPY  05/20/2008   Mild to moderate esophagitis. Otherwise normal EGD.   Marland Kitchen EYE SURGERY Left    cataract surgery with lens implant  . GASTROSTOMY     peptic ulcer - peg tube placement   Social History   Occupational History  . Not on file  Tobacco Use  . Smoking status: Former Smoker    Packs/day: 1.00    Years: 22.00    Pack years: 22.00    Types: Cigarettes    Quit date: 06/26/1986    Years since quitting: 33.7  . Smokeless tobacco: Never Used  Vaping Use  . Vaping Use: Never used  Substance and Sexual Activity  . Alcohol use: No  . Drug use: No  . Sexual activity: Not Currently    Birth control/protection: None

## 2020-03-04 NOTE — Telephone Encounter (Signed)
Reaching out to patient to offer assistance regarding upcoming cardiac imaging study; pt verbalizes understanding of appt date/time, parking situation and where to check in, pre-test NPO status and medications ordered, and verified current allergies; name and call back number provided for further questions should they arise Marchia Bond RN [place] [facility] [phone] office [phone] cell   Pt reminded of taking 13 hr allergy prep medications for contrast. Pt verbalized understanding.  Pt states resting HR 70s (message sent to [facility] regarding HR controlling meds, awaiting response) Clarise Cruz

## 2020-03-04 NOTE — Patient Instructions (Signed)
Avoid overhead lifting and overhead use of the arms. Do not lift greater than 5-10 lbs. Adjust head rest in vehicle to prevent hyperextension if rear ended. Take extra precautions to avoid falling. Unable to take NSAIDs due to history of CKD stage 3, last Cr normal so will continue use of narcotic. EMG/NCV ordered due to worsening right arm weakness and pain.

## 2020-03-05 ENCOUNTER — Other Ambulatory Visit (HOSPITAL_COMMUNITY): Payer: Self-pay | Admitting: Emergency Medicine

## 2020-03-05 ENCOUNTER — Other Ambulatory Visit: Payer: Self-pay | Admitting: Specialist

## 2020-03-05 ENCOUNTER — Other Ambulatory Visit: Payer: Self-pay | Admitting: Family

## 2020-03-05 ENCOUNTER — Telehealth (HOSPITAL_COMMUNITY): Payer: Self-pay | Admitting: Emergency Medicine

## 2020-03-05 DIAGNOSIS — R079 Chest pain, unspecified: Secondary | ICD-10-CM

## 2020-03-05 DIAGNOSIS — R072 Precordial pain: Secondary | ICD-10-CM

## 2020-03-05 MED ORDER — METOPROLOL TARTRATE 50 MG PO TABS: 50.0000 mg | ORAL_TABLET | Freq: Once | ORAL | 0 refills | Status: DC

## 2020-03-05 NOTE — Telephone Encounter (Signed)
Discussed HR control with Dr. Agustin Cree and he proposed a one time dose 50mg  metoprolol tartrate for HR control for this patients CCTA on Monday 9/13.  Attempt to call patient and explain was made. Left detailed message on answering machine about medication and where to pick it up. Also when to take the medication in preparation for the CT appt.   Callback number provided and encouraged her to call for any questions.  Marchia Bond RN Navigator Cardiac Imaging [facility] [phone] Office  [phone] Cell

## 2020-03-08 ENCOUNTER — Other Ambulatory Visit: Payer: Self-pay

## 2020-03-08 ENCOUNTER — Ambulatory Visit (HOSPITAL_COMMUNITY)
Admission: RE | Admit: 2020-03-08 | Discharge: 2020-03-08 | Disposition: A | Discharge status: Home / Self Care | Payer: Medicare Other | Source: Ambulatory Visit | Attending: Cardiology | Admitting: Cardiology

## 2020-03-08 ENCOUNTER — Encounter (HOSPITAL_COMMUNITY): Payer: Self-pay

## 2020-03-08 DIAGNOSIS — I700 Atherosclerosis of aorta: Secondary | ICD-10-CM | POA: Insufficient documentation

## 2020-03-08 DIAGNOSIS — R079 Chest pain, unspecified: Secondary | ICD-10-CM | POA: Diagnosis not present

## 2020-03-08 LAB — POCT I-STAT CREATININE: Creatinine, Ser: 0.6 mg/dL (ref 0.44–1.00)

## 2020-03-08 MED ORDER — METOPROLOL TARTRATE 5 MG/5ML IV SOLN: 5.0000 mg | INTRAVENOUS | Status: DC | PRN

## 2020-03-08 MED ORDER — NITROGLYCERIN 0.4 MG SL SUBL
SUBLINGUAL_TABLET | SUBLINGUAL | Status: AC
  Administered 2020-03-08: 0.8 mg via SUBLINGUAL
  Filled 2020-03-08: qty 2

## 2020-03-08 MED ORDER — IOHEXOL 350 MG/ML SOLN
80.0000 mL | Freq: Once | INTRAVENOUS | Status: AC | PRN
  Administered 2020-03-08: 80 mL via INTRAVENOUS

## 2020-03-08 MED ORDER — NITROGLYCERIN 0.4 MG SL SUBL: 0.8000 mg | SUBLINGUAL_TABLET | SUBLINGUAL | Status: DC | PRN

## 2020-03-11 ENCOUNTER — Telehealth (HOSPITAL_COMMUNITY): Payer: Self-pay | Admitting: Emergency Medicine

## 2020-03-11 ENCOUNTER — Other Ambulatory Visit: Payer: Self-pay | Admitting: Specialist

## 2020-03-11 NOTE — Telephone Encounter (Signed)
Pt calling requesting information about her test results from 9/13. Will forward to ordering MD.  Marchia Bond RN Navigator Cardiac Imaging [facility] Services [phone] Office  [phone] Cell

## 2020-03-12 ENCOUNTER — Telehealth (HOSPITAL_COMMUNITY): Payer: Self-pay | Admitting: Emergency Medicine

## 2020-03-12 ENCOUNTER — Telehealth: Payer: Self-pay

## 2020-03-12 NOTE — Telephone Encounter (Signed)
Called patient informed her of results and scheduled her for a follow up appointment.

## 2020-03-12 NOTE — Telephone Encounter (Signed)
[facility] lvm on triage phone needs dx code for the hydrocodone Dr. Louanne Skye sent in.   CB# [phone]

## 2020-03-12 NOTE — Telephone Encounter (Signed)
Pt called and left VM on my office phone requesting someone contact her regarding her CCTA results.   Please call her back at [phone] as I cannot review results with patients  Marchia Bond RN Navigator Cardiac Imaging [facility] and V[facility] Services [phone] Office  [phone] Cell

## 2020-03-12 NOTE — Telephone Encounter (Signed)
I called and gave them the Dx from last ov note

## 2020-03-17 ENCOUNTER — Other Ambulatory Visit: Payer: Self-pay | Admitting: Specialist

## 2020-03-18 ENCOUNTER — Other Ambulatory Visit: Payer: Self-pay | Admitting: Specialist

## 2020-03-18 ENCOUNTER — Telehealth: Payer: Self-pay | Admitting: Gastroenterology

## 2020-03-18 ENCOUNTER — Other Ambulatory Visit: Payer: Self-pay | Admitting: Gastroenterology

## 2020-03-18 DIAGNOSIS — R110 Nausea: Secondary | ICD-10-CM

## 2020-03-18 MED ORDER — PROMETHAZINE HCL 25 MG PO TABS: ORAL_TABLET | ORAL | 0 refills | Status: DC

## 2020-03-18 NOTE — Telephone Encounter (Signed)
Sent refill to patients pharmacy. 

## 2020-03-23 ENCOUNTER — Other Ambulatory Visit: Payer: Self-pay

## 2020-03-23 ENCOUNTER — Ambulatory Visit (INDEPENDENT_AMBULATORY_CARE_PROVIDER_SITE_OTHER): Payer: Medicare Other | Admitting: Physician Assistant

## 2020-03-23 ENCOUNTER — Encounter: Payer: Self-pay | Admitting: Physician Assistant

## 2020-03-23 VITALS — BP 142/86 | Ht 60.0 in | Wt 169.0 lb

## 2020-03-23 DIAGNOSIS — F329 Major depressive disorder, single episode, unspecified: Secondary | ICD-10-CM

## 2020-03-23 DIAGNOSIS — R600 Localized edema: Secondary | ICD-10-CM | POA: Diagnosis not present

## 2020-03-23 DIAGNOSIS — I10 Essential (primary) hypertension: Secondary | ICD-10-CM | POA: Diagnosis not present

## 2020-03-23 DIAGNOSIS — F419 Anxiety disorder, unspecified: Secondary | ICD-10-CM

## 2020-03-23 DIAGNOSIS — G4700 Insomnia, unspecified: Secondary | ICD-10-CM

## 2020-03-23 MED ORDER — SERTRALINE HCL 50 MG PO TABS: 50.0000 mg | ORAL_TABLET | Freq: Every day | ORAL | 1 refills | Status: DC

## 2020-03-23 MED ORDER — TRAZODONE HCL 100 MG PO TABS: 100.0000 mg | ORAL_TABLET | Freq: Every day | ORAL | 1 refills | Status: DC

## 2020-03-23 NOTE — Progress Notes (Signed)
Telehealth office visit note for Amy Reid, PA-C- at Primary Care at [facility]   I connected with current patient today by telephone and verified that I am speaking with the correct person    Location of the patient: Home  Location of the provider: Office - This visit type was conducted due to national recommendations for restrictions regarding the COVID-19 Pandemic (e.g. social distancing) in an effort to limit this patient's exposure and mitigate transmission in our community.    - No physical exam could be performed with this format, beyond that communicated to [place] by the patient/ family members as noted.   - Additionally my office staff/ schedulers were to discuss with the patient that there may be a monetary charge related to this service, depending on their medical insurance.  My understanding is that patient understood and consented to proceed.     _________________________________________________________________________________   History of Present Illness: Pt calls in to follow up on insomnia and mood.  Patient reports she has been taking 50 mg of trazodone continues to have issues with sleeping. Taking Zoloft 25 mg without issues but feels her dose needs to be adjusted.  She continues to have trouble with concentrating and feeling tired.  HTN: Pt denies new onset of chest pain, palpitations, dizziness or leg swelling.  Patient has chronic edema which fluctuates and is on Lasix (managed by cardiology).  Reports Lasix has helped some with edema. taking medication as directed without side effects. Checks BP at home and unable to clearly state her blood pressure readings.  Patient reports it can fluctuate.      GAD 7 : Generalized Anxiety Score 12/22/2019  Nervous, Anxious, on Edge 3  Control/stop worrying 1  Worry too much - different things 3  Trouble relaxing 3  Restless 1  Easily annoyed or irritable 0  Afraid - awful might happen 1  Total GAD 7 Score 12   Anxiety Difficulty Somewhat difficult    Depression screen [facility] 2/9 03/23/2020 12/22/2019 06/17/2019 06/03/2018 05/09/2018  Decreased Interest 1 1 1 1 1   Down, Depressed, Hopeless - 1 1 2 2   PHQ - 2 Score 1 2 2 3 3   Altered sleeping 3 3 3 3 3   Tired, decreased energy 3 1 1 3 3   Change in appetite 1 0 1 3 1   Feeling bad or failure about yourself  0 1 1 2  0  Trouble concentrating 1 1 1 2 1   Moving slowly or fidgety/restless 0 1 3 2 1   Suicidal thoughts 0 0 0 0 0  PHQ-9 Score 9 9 12 18 12   Difficult doing work/chores Somewhat difficult Somewhat difficult Somewhat difficult Very difficult Somewhat difficult      Impression and Recommendations:     1. Insomnia, unspecified type   2. Anxiety and depression   3. Hypertension, unspecified type   4. Lower extremity edema     Insomnia: -Not significantly improved so will increase trazodone to 100 mg. -Encourage to establish a good sleep hygiene and limit caffeine.  -Will continue to monitor.  Anxiety and depression: -PHQ-9 score of 9, denies SI/HI. -Patient is on a low dose of Zoloft so will increase to 50 mg. -Follow-up in 8 weeks to reassess symptoms and medication therapy.  Hypertension: -BP today is above goal and unclear of ambulatory BP readings.  Advised patient to continue ambulatory BP and pulse readings and keep a log.  Discussed with patient if BP consistently above 140/90 would consider  making medication adjustments and increase losartan.  Patient verbalized understanding. -Continue current medication regimen. -Follow low-sodium diet. -Will continue to monitor.  Lower extremity edema: -Continue to use Lasix as needed. -And to follow-up with cardiology. -Recommend elevation, low-sodium diet and compression socks.   - As part of my medical decision making, I reviewed the following data within the [place] History obtained from pt /family, CMA notes reviewed and incorporated if applicable, Labs  reviewed, Radiograph/ tests reviewed if applicable and OV notes from prior OV's with me, as well as any other specialists she/he has seen since seeing me last, were all reviewed and used in my medical decision making process today.    - Additionally, when appropriate, discussion had with patient regarding our treatment plan, and their biases/concerns about that plan were used in my medical decision making today.    - The patient agreed with the plan and demonstrated an understanding of the instructions.   No barriers to understanding were identified.     - The patient was advised to call back or seek an in-person evaluation if the symptoms worsen or if the condition fails to improve as anticipated.   Return in about 8 weeks (around 05/18/2020) for Insomnia-inc med, Mood- inc med, HTN.    No orders of the defined types were placed in this encounter.   Meds ordered this encounter  Medications   sertraline (ZOLOFT) 50 MG tablet    Sig: Take 1 tablet (50 mg total) by mouth daily.    Dispense:  30 tablet    Refill:  1    Order Specific Question:   Supervising Provider    Answer:   Beatrice Lecher D [[id number]]   traZODone (DESYREL) 100 MG tablet    Sig: Take 1 tablet (100 mg total) by mouth at bedtime.    Dispense:  30 tablet    Refill:  1    Order Specific Question:   Supervising Provider    Answer:   Beatrice Lecher D [[id number]]    Medications Discontinued During This Encounter  Medication Reason   promethazine (PHENERGAN) 25 MG tablet Error   traZODone (DESYREL) 50 MG tablet Dose change   sertraline (ZOLOFT) 25 MG tablet Dose change       Time spent on visit including pre-visit chart review and post-visit care was 15 minutes.      The [place] was signed into law in 2016 which includes the topic of electronic health records.  This provides immediate access to information in MyChart.  This includes consultation notes, operative notes, office notes, lab  results and pathology reports.  If you have any questions about what you read please let us know at your next visit or call us at the office.  We are right here with you.  Note:  This note was prepared with assistance of Dragon voice recognition software. Occasional wrong-word or sound-a-like substitutions may have occurred due to the inherent limitations of voice recognition software.   __________________________________________________________________________________     Patient Care Team    Relationship Specialty Notifications Start End  Amy Moon, [place] PCP - General Physician Assistant  12/22/19   Park Liter, MD PCP - Cardiology Cardiology Admissions 07/01/18   Jackquline Denmark, MD Consulting Physician Gastroenterology  05/09/18      -Vitals obtained; medications/ allergies reconciled;  personal medical, social, Sx etc.histories were updated by CMA, reviewed by me and are reflected in chart   Patient Active Problem List  Diagnosis Date Noted   Stage 3 chronic kidney disease ([place]) 10/21/2019   History of smoking 25-50 pack years- quit age 42, smoked 2ppd from 69 yo-69 yo. 10/21/2019   History of surgery- pt has several Sx- no personal issues wiht anesthesia  10/21/2019   Sleep disturbance 10/21/2019   History of exposure to asbestos 10/21/2019   Family history of adverse reaction to anesthesia    Dyspnea on exertion 10/17/2019   Bronchitis 06/03/2018   Encounter for Medicare annual wellness exam 05/09/2018   Vitamin D deficiency 11/05/2017   Encounter for hepatitis C virus screening test for high risk patient 11/05/2017   Dysphagia 09/03/2017   Thrombocytopenia ([place]) 09/03/2017   Healthcare maintenance 07/30/2017   Chronic pain syndrome 07/30/2017   Chest pain 07/30/2017   Parkinson's disease ([place]) 07/30/2017   Hypertension 07/30/2017   Severe obesity (BMI >= 40) ([place]) 05/15/2016   Severe episode of recurrent major depressive disorder,  without psychotic features ([place]) 05/15/2016   Tremor of both hands 05/15/2016   Nausea 03/01/2016   Irritant contact dermatitis 08/25/2015     Current Meds  Medication Sig   acetaminophen (TYLENOL) 650 MG CR tablet Take 650 mg by mouth every 6 (six) hours as needed for pain.   bisacodyl (DULCOLAX) 5 MG EC tablet Take 15 mg by mouth at bedtime as needed for moderate constipation.   clopidogrel (PLAVIX) 75 MG tablet TAKE 1 TABLET BY MOUTH DAILY.   cyclobenzaprine (FLEXERIL) 10 MG tablet TAKE 1 TABLET BY MOUTH EVERY 8 HOURS AS NEEDED   diphenhydrAMINE (BENADRYL) 25 MG tablet Take 25 mg by mouth every 6 (six) hours as needed.   furosemide (LASIX) 20 MG tablet Take 1 tablet (20 mg total) by mouth daily.   losartan (COZAAR) 50 MG tablet Take 1 tablet (50 mg total) by mouth daily.   metoprolol tartrate (LOPRESSOR) 50 MG tablet Take 1 tablet (50 mg total) by mouth once for 1 dose. Take 1 tablet (50mg ) 2 hr prior to CT appt   nitroGLYCERIN (NITROSTAT) 0.4 MG SL tablet Place 0.4 mg under the tongue every 5 (five) minutes as needed for chest pain.    pantoprazole (PROTONIX) 40 MG tablet Take 1 tablet (40 mg total) by mouth daily.   promethazine (PHENERGAN) 25 MG tablet TAKE 1 TABLET BY MOUTH EVERY 6 HOURS AS NEEDED FOR NAUSEA OR VOMITING. Patient needs to call [phone] to schedule an office visit for more refills of this medication   ranolazine (RANEXA) 500 MG 12 hr tablet TAKE 1 TABLET BY MOUTH 2 TIMES DAILY.   Vitamin D, Ergocalciferol, (DRISDOL) 1.25 MG (50000 UT) CAPS capsule Take 1 capsule (50,000 Units total) by mouth every 7 (seven) days.   [DISCONTINUED] HYDROcodone-acetaminophen (NORCO/VICODIN) 5-325 MG tablet TAKE 1 TABLET BY MOUTH EVERY 4 HOURS AS NEEDED FOR UP TO 5 DAYS FOR MODERATE PAIN.   [DISCONTINUED] sertraline (ZOLOFT) 25 MG tablet Take 1 tablet (25 mg total) by mouth daily. **PATIENT NEEDS APT FOR FURTHER REFILLS**   Current Facility-Administered Medications for  the 03/23/20 encounter (Office Visit) with Amy Reid, PA-C  Medication   bupivacaine (MARCAINE) 0.25 % (with pres) injection 2 mL     Allergies:  Allergies  Allergen Reactions   Iodinated Diagnostic Agents Hives   Nsaids Other (See Comments)    History of bleeding ulcers   Latex Hives and Rash   Sinemet [Carbidopa W-Levodopa] Nausea And Vomiting   Inderal [Propranolol] Other (See Comments)    Unknown   Indomethacin Other (See Comments)  Unknown   Penicillin G Other (See Comments)    Unknown   Pneumococcal Vaccines Other (See Comments)    Caused "pneumonia"   Scopolamine Other (See Comments)    The patch, caused her to pass out / change in mental status   Tape Other (See Comments)    Unknown     ROS:  See above HPI for pertinent positives and negatives   Objective:   Blood pressure (!) 142/86, height 5' (1.524 m), weight 169 lb (76.7 kg).  (if some vitals are omitted, this means that patient was UNABLE to obtain them even though they were asked to get them prior to OV today.  They were asked to call us at their earliest convenience with these once obtained. ) General: A & O * 3; sounds in no acute distress Respiratory: speaking in full sentences, no conversational dyspnea Psych: insight appears good, mood- appropriate

## 2020-03-24 ENCOUNTER — Other Ambulatory Visit: Payer: Self-pay | Admitting: Specialist

## 2020-03-25 ENCOUNTER — Ambulatory Visit: Payer: Medicare Other | Admitting: Specialist

## 2020-03-29 ENCOUNTER — Other Ambulatory Visit: Payer: Self-pay | Admitting: Specialist

## 2020-04-01 ENCOUNTER — Other Ambulatory Visit: Payer: Self-pay | Admitting: Specialist

## 2020-04-01 DIAGNOSIS — D71 Functional disorders of polymorphonuclear neutrophils: Secondary | ICD-10-CM | POA: Insufficient documentation

## 2020-04-01 DIAGNOSIS — K7460 Unspecified cirrhosis of liver: Secondary | ICD-10-CM | POA: Insufficient documentation

## 2020-04-01 DIAGNOSIS — D649 Anemia, unspecified: Secondary | ICD-10-CM | POA: Insufficient documentation

## 2020-04-01 DIAGNOSIS — R161 Splenomegaly, not elsewhere classified: Secondary | ICD-10-CM | POA: Insufficient documentation

## 2020-04-01 DIAGNOSIS — K219 Gastro-esophageal reflux disease without esophagitis: Secondary | ICD-10-CM | POA: Insufficient documentation

## 2020-04-01 DIAGNOSIS — T50905A Adverse effect of unspecified drugs, medicaments and biological substances, initial encounter: Secondary | ICD-10-CM | POA: Insufficient documentation

## 2020-04-01 DIAGNOSIS — F32A Depression, unspecified: Secondary | ICD-10-CM | POA: Insufficient documentation

## 2020-04-01 DIAGNOSIS — J189 Pneumonia, unspecified organism: Secondary | ICD-10-CM | POA: Insufficient documentation

## 2020-04-01 DIAGNOSIS — N189 Chronic kidney disease, unspecified: Secondary | ICD-10-CM | POA: Insufficient documentation

## 2020-04-01 DIAGNOSIS — H269 Unspecified cataract: Secondary | ICD-10-CM | POA: Insufficient documentation

## 2020-04-01 DIAGNOSIS — R7989 Other specified abnormal findings of blood chemistry: Secondary | ICD-10-CM | POA: Insufficient documentation

## 2020-04-01 DIAGNOSIS — D689 Coagulation defect, unspecified: Secondary | ICD-10-CM | POA: Insufficient documentation

## 2020-04-01 DIAGNOSIS — K769 Liver disease, unspecified: Secondary | ICD-10-CM | POA: Insufficient documentation

## 2020-04-01 DIAGNOSIS — M810 Age-related osteoporosis without current pathological fracture: Secondary | ICD-10-CM | POA: Insufficient documentation

## 2020-04-01 DIAGNOSIS — K766 Portal hypertension: Secondary | ICD-10-CM | POA: Insufficient documentation

## 2020-04-01 DIAGNOSIS — I209 Angina pectoris, unspecified: Secondary | ICD-10-CM | POA: Insufficient documentation

## 2020-04-01 DIAGNOSIS — K449 Diaphragmatic hernia without obstruction or gangrene: Secondary | ICD-10-CM | POA: Insufficient documentation

## 2020-04-01 DIAGNOSIS — J45909 Unspecified asthma, uncomplicated: Secondary | ICD-10-CM | POA: Insufficient documentation

## 2020-04-01 DIAGNOSIS — M8580 Other specified disorders of bone density and structure, unspecified site: Secondary | ICD-10-CM | POA: Insufficient documentation

## 2020-04-01 DIAGNOSIS — C801 Malignant (primary) neoplasm, unspecified: Secondary | ICD-10-CM | POA: Insufficient documentation

## 2020-04-01 DIAGNOSIS — Z87442 Personal history of urinary calculi: Secondary | ICD-10-CM | POA: Insufficient documentation

## 2020-04-01 DIAGNOSIS — T7840XA Allergy, unspecified, initial encounter: Secondary | ICD-10-CM | POA: Insufficient documentation

## 2020-04-01 DIAGNOSIS — G629 Polyneuropathy, unspecified: Secondary | ICD-10-CM | POA: Insufficient documentation

## 2020-04-01 DIAGNOSIS — K269 Duodenal ulcer, unspecified as acute or chronic, without hemorrhage or perforation: Secondary | ICD-10-CM | POA: Insufficient documentation

## 2020-04-01 DIAGNOSIS — D719 Functional disorders of polymorphonuclear neutrophils, unspecified: Secondary | ICD-10-CM | POA: Insufficient documentation

## 2020-04-01 DIAGNOSIS — E049 Nontoxic goiter, unspecified: Secondary | ICD-10-CM | POA: Insufficient documentation

## 2020-04-01 DIAGNOSIS — M1990 Unspecified osteoarthritis, unspecified site: Secondary | ICD-10-CM | POA: Insufficient documentation

## 2020-04-02 ENCOUNTER — Telehealth: Payer: Self-pay | Admitting: Cardiology

## 2020-04-02 ENCOUNTER — Encounter: Payer: Self-pay | Admitting: Cardiology

## 2020-04-02 ENCOUNTER — Other Ambulatory Visit: Payer: Self-pay

## 2020-04-02 ENCOUNTER — Ambulatory Visit (INDEPENDENT_AMBULATORY_CARE_PROVIDER_SITE_OTHER): Payer: Medicare Other | Admitting: Cardiology

## 2020-04-02 VITALS — BP 124/64 | HR 91 | Ht 60.0 in | Wt 152.0 lb

## 2020-04-02 DIAGNOSIS — I2510 Atherosclerotic heart disease of native coronary artery without angina pectoris: Secondary | ICD-10-CM | POA: Insufficient documentation

## 2020-04-02 DIAGNOSIS — I10 Essential (primary) hypertension: Secondary | ICD-10-CM

## 2020-04-02 DIAGNOSIS — R0609 Other forms of dyspnea: Secondary | ICD-10-CM

## 2020-04-02 DIAGNOSIS — E785 Hyperlipidemia, unspecified: Secondary | ICD-10-CM

## 2020-04-02 DIAGNOSIS — R072 Precordial pain: Secondary | ICD-10-CM

## 2020-04-02 DIAGNOSIS — R0600 Dyspnea, unspecified: Secondary | ICD-10-CM

## 2020-04-02 DIAGNOSIS — Z77090 Contact with and (suspected) exposure to asbestos: Secondary | ICD-10-CM

## 2020-04-02 HISTORY — DX: Atherosclerotic heart disease of native coronary artery without angina pectoris: I25.10

## 2020-04-02 HISTORY — DX: Hyperlipidemia, unspecified: E78.5

## 2020-04-02 IMAGING — MG MM PLC BREAST LOC DEV 1ST LESION INC*R*
8 series · 8 of 8 positions shown · non-contrast
Comparison: Previous exam(s).

CLINICAL DATA: Patient is scheduled for surgical excision requiring
preoperative radioactive seed localization.

EXAM:
MAMMOGRAPHIC GUIDED RADIOACTIVE SEED LOCALIZATION OF THE RIGHT
BREAST

[R CC (1 of 5)]
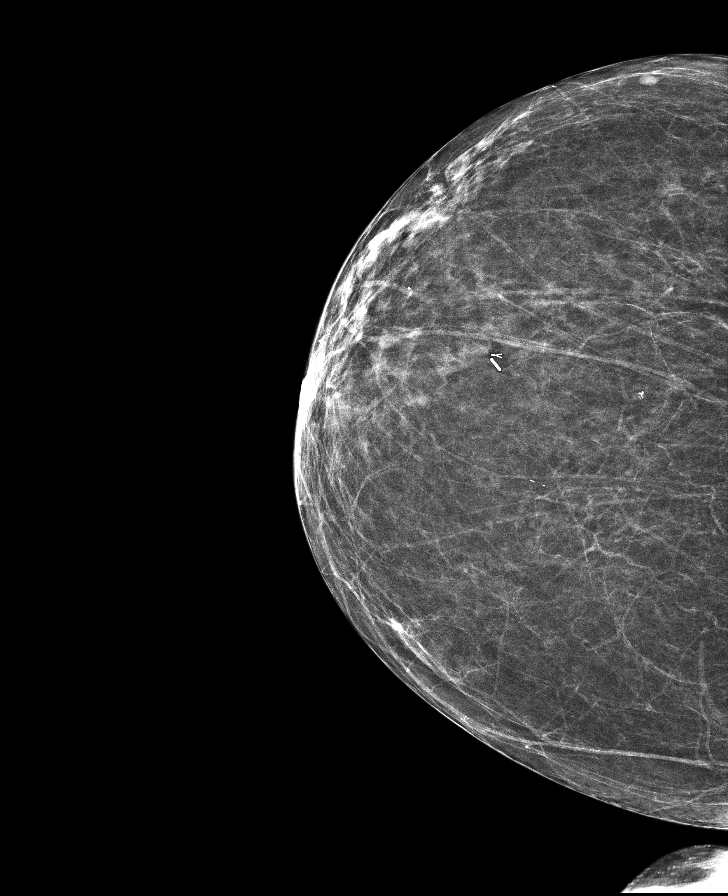

[R CC (2 of 5)]
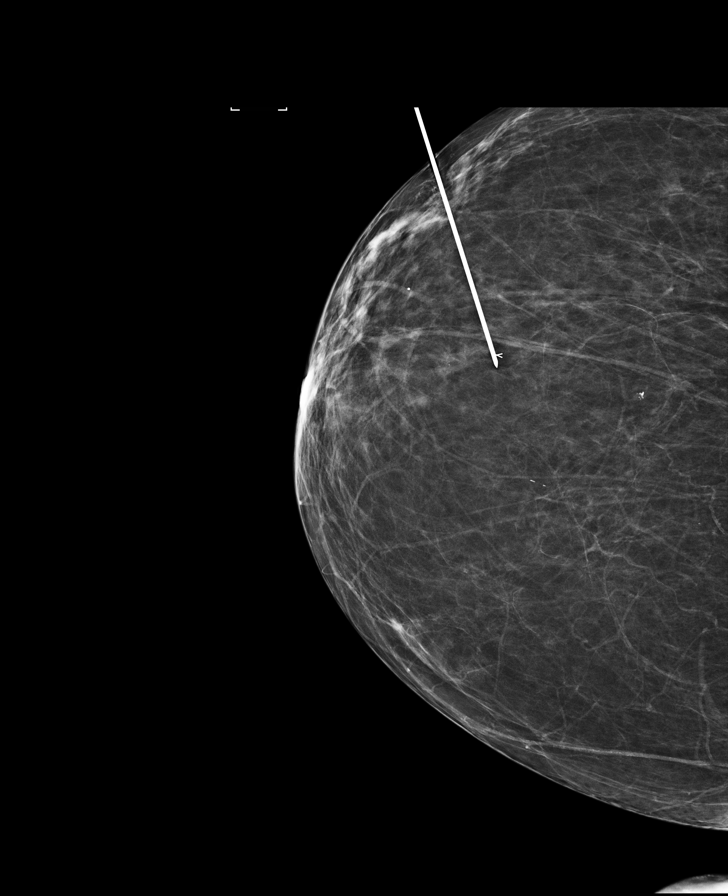

[R CC (3 of 5)]
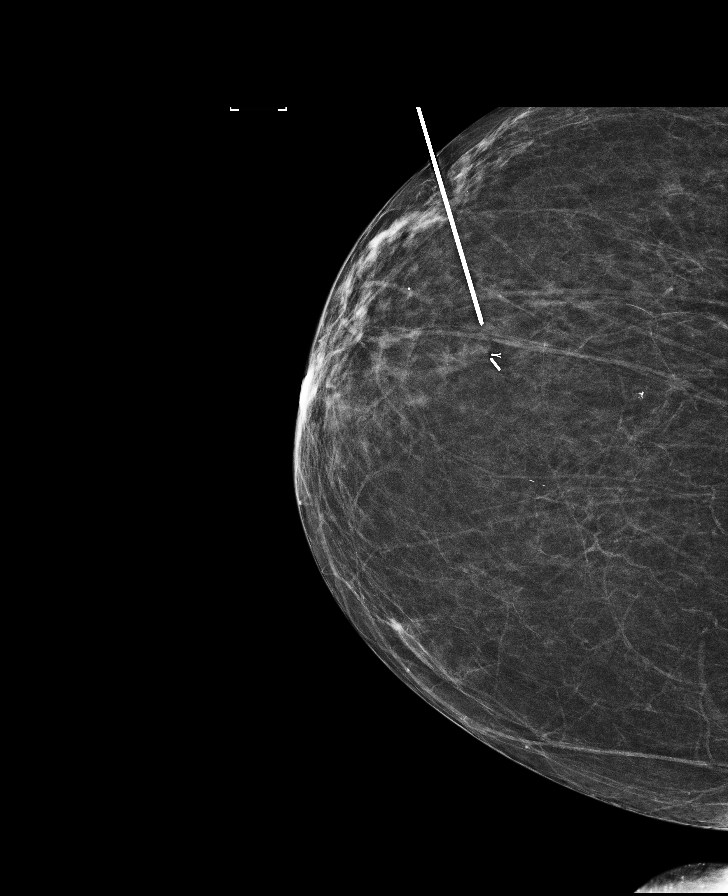

[R CC (4 of 5)]
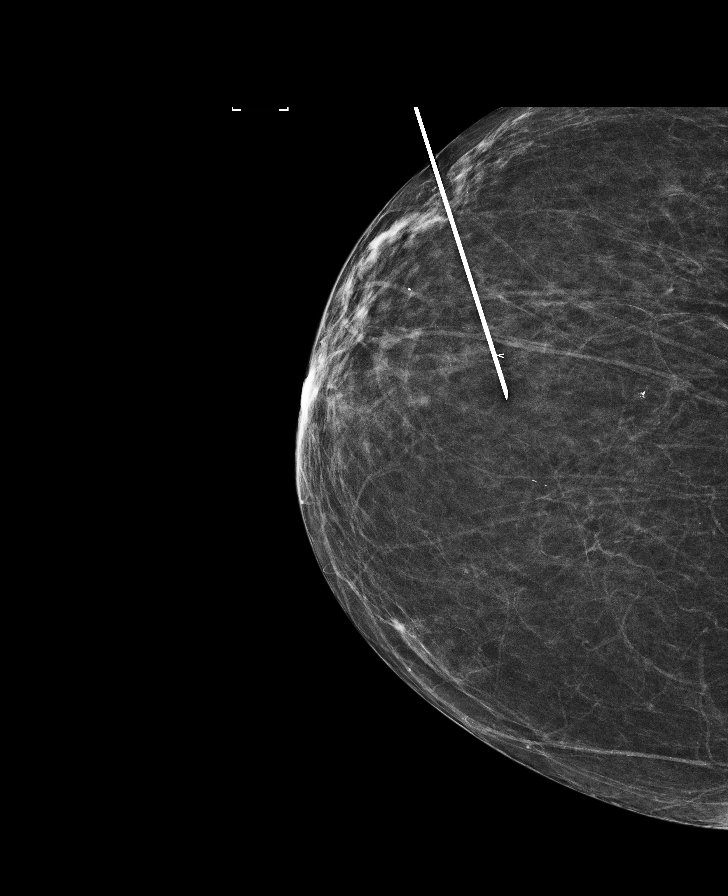

[R ML]
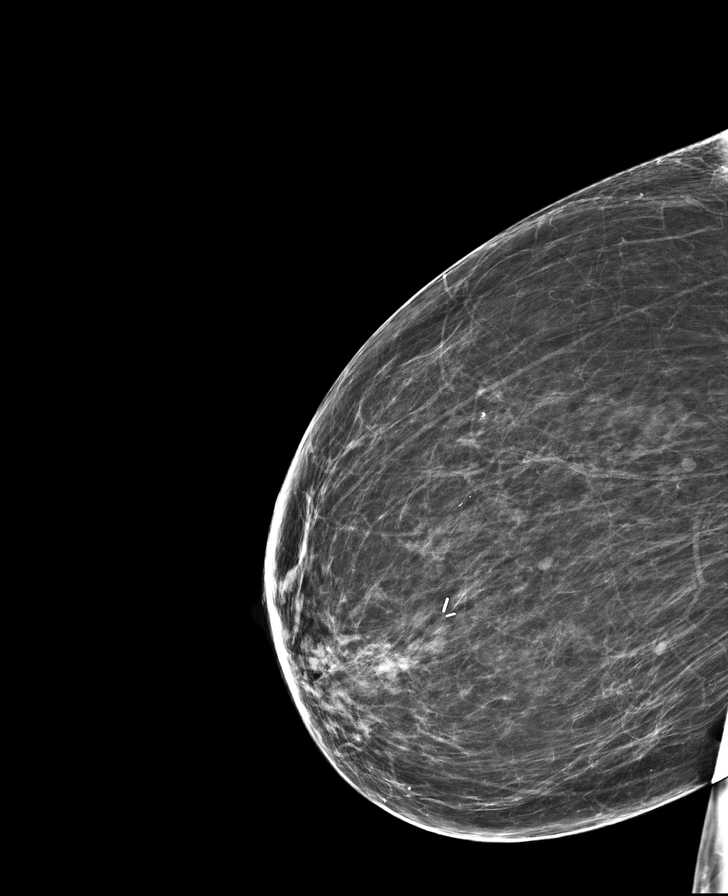

[R CC (5 of 5)]
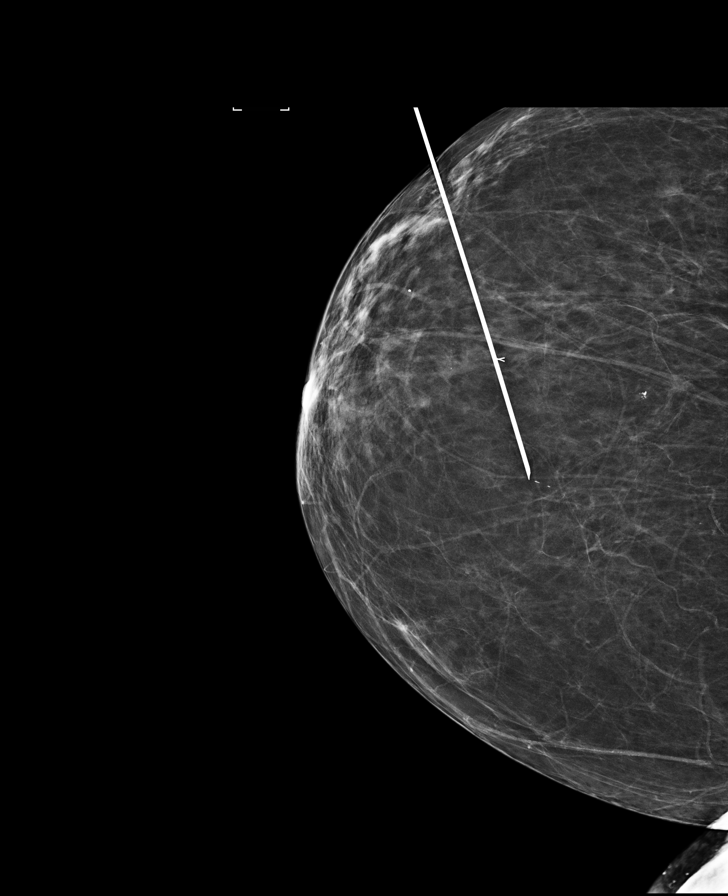

[R LM (1 of 2)]
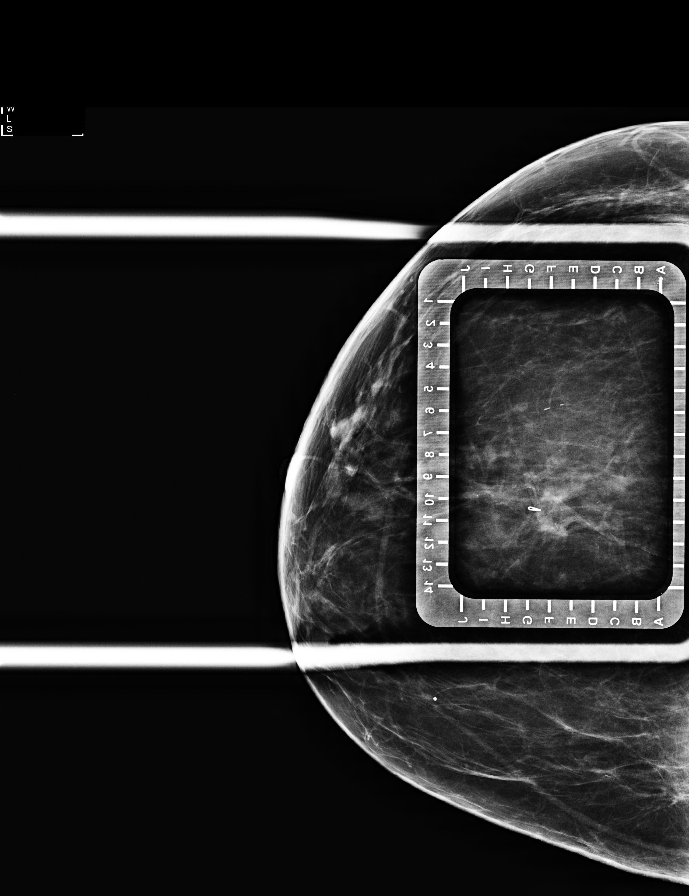

[R LM (2 of 2)]
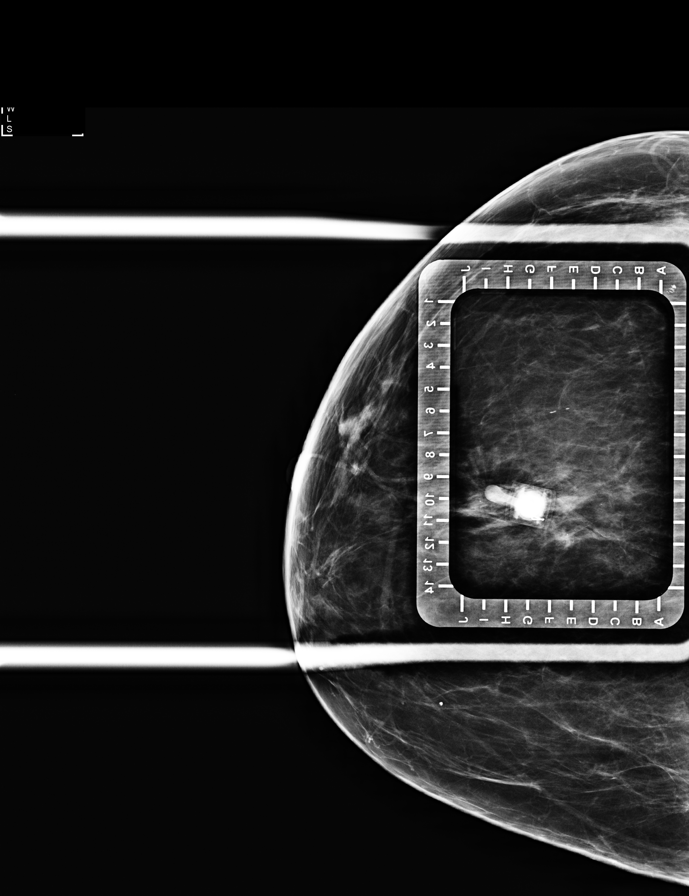

[8 of 8 positions shown; findings below may reference images not displayed]

FINDINGS: Patient presents for radioactive seed localization prior to surgical
excision. I met with the patient and we discussed the procedure of
seed localization including benefits and alternatives. We discussed
the high likelihood of a successful procedure. We discussed the
risks of the procedure including infection, bleeding, tissue injury
and further surgery. We discussed the low dose of radioactivity
involved in the procedure. Informed, written consent was given.

The usual time-out protocol was performed immediately prior to the
procedure.

Using mammographic guidance, sterile technique, 1% lidocaine and an
B-BDK radioactive seed, the ribbon shaped clip within the RIGHT
breast was localized using a lateral approach. The follow-up
mammogram images confirm the seed in the expected location and were
marked for Dr. Sangita.

Follow-up survey of the patient confirms presence of the radioactive
seed.

Order number of B-BDK seed:  [id number].

Total activity:  0.252 millicurie reference Date: 07/26/2018

The patient tolerated the procedure well and was released from the
[REDACTED]. She was given instructions regarding seed removal.
IMPRESSION: Radioactive seed localization right breast. No apparent
complications. Based on today's postprocedural mammogram, the
targeted distortion is favored to be slightly anterior to the
clip/seed. Discussed with Dr. Sangita at the conclusion of today's
procedure.

## 2020-04-02 MED ORDER — RANOLAZINE ER 1000 MG PO TB12: 1000.0000 mg | ORAL_TABLET | Freq: Two times a day (BID) | ORAL | 1 refills | Status: DC

## 2020-04-02 NOTE — Patient Instructions (Signed)
Medication Instructions:  Your physician has recommended you make the following change in your medication:   INCREASE: Ranexa to 1000 mg twice daily   *If you need a refill on your cardiac medications before your next appointment, please call your pharmacy*   Lab Work: Your physician recommends that you return for lab work today: lipid, cmp, pro bnp  If you have labs (blood work) drawn today and your tests are completely normal, you will receive your results only by: Marland Kitchen MyChart Message (if you have MyChart) OR . A paper copy in the mail If you have any lab test that is abnormal or we need to change your treatment, we will call you to review the results.   Testing/Procedures: None   Follow-Up: At [facility], you and your health needs are our priority.  As part of our continuing mission to provide you with exceptional heart care, we have created designated Provider Care Teams.  These Care Teams include your primary Cardiologist (physician) and Advanced Practice Providers (APPs -  Physician Assistants and Nurse Practitioners) who all work together to provide you with the care you need, when you need it.  We recommend signing up for the patient portal called "MyChart".  Sign up information is provided on this After Visit Summary.  MyChart is used to connect with patients for Virtual Visits (Telemedicine).  Patients are able to view lab/test results, encounter notes, upcoming appointments, etc.  Non-urgent messages can be sent to your provider as well.   To learn more about what you can do with MyChart, go to [email or web address].    Your next appointment:   3 month(s)  The format for your next appointment:   In Person  Provider:   Jenne Campus, MD   Other Instructions

## 2020-04-02 NOTE — Telephone Encounter (Signed)
[place] wanted to confirm increase of ranexa today 1000 mg bid - aware that pt was seen today and increase was correct

## 2020-04-02 NOTE — Progress Notes (Signed)
Cardiology Office Note:    Date:  04/02/2020   ID:  Amy Moon, DOB 1950-10-19, MRN [id number]  PCP:  Lorrene Reid, PA-C  Cardiologist:  Jenne Campus, MD    Referring MD: Lorrene Reid, PA-C   No chief complaint on file. I am doing the same  History of Present Illness:    Amy Moon is a 69 y.o. female with past medical history significant for chronic smoking, asbestos exposure, she was referred to [place] because of shortness of breath as well as some atypical chest pain.  She finally had coronary CT angios done which showed some significant calcium deposits, also up to 65% stenosis in the LAD.  On top of that there was some lesions in the right coronary artery.  She comes today to talk about dose issues.  Still complain of having some shortness of breath while walking does have some swelling of lower extremities which improved significantly with Lasix.  Denies having any typical tightness squeezing pressure burning chest.  Past Medical History:  Diagnosis Date  . Allergy   . Anemia   . Angina pectoris ([place])   . Arthritis   . Asthma    years ago  . Bronchitis 06/03/2018  . Cancer (HCC)    squamous cell carcinoma, nose  . Cataract   . Chest pain 07/30/2017  . Chronic kidney disease    possibly per pt  . Chronic pain syndrome 07/30/2017  . Cirrhosis ([place])   . Clotting disorder (HCC)    platelets are low  . Depression   . Drug-induced low platelet count   . Duodenal ulcer   . Dysphagia 09/03/2017  . Dyspnea on exertion 10/17/2019  . Encounter for hepatitis C virus screening test for high risk patient 11/05/2017  . Encounter for Medicare annual wellness exam 05/09/2018  . Enlarged thyroid   . Family history of adverse reaction to anesthesia    father had difficulty waking up and breathing on his own after surgery  . GERD (gastroesophageal reflux disease)   . Granulomatous disease ([place])   . Healthcare maintenance 07/30/2017  . Hepatic disease   . Hiatal hernia   .  History of exposure to asbestos 10/21/2019  . History of kidney stones   . History of smoking 25-50 pack years- quit age 56, smoked 2ppd from 69 yo-69 yo. 10/21/2019  . History of surgery- pt has several Sx- no personal issues wiht anesthesia  10/21/2019  . Hypertension   . Irritant contact dermatitis 08/25/2015  . Low vitamin D level   . Nausea 03/01/2016  . Neuropathy   . Osteopenia   . Osteoporosis   . Parkinson's disease ([place])   . Pneumonia   . Portal hypertension ([place])   . Severe episode of recurrent major depressive disorder, without psychotic features ([place]) 05/15/2016  . Severe obesity (BMI >= 40) ([place]) 05/15/2016  . Sleep disturbance 10/21/2019  . Splenomegaly a  . Stage 3 chronic kidney disease ([place]) 10/21/2019  . Thrombocytopenia ([place]) 09/03/2017  . Tremor of both hands 05/15/2016  . Vitamin D deficiency 11/05/2017    Past Surgical History:  Procedure Laterality Date  . ABDOMINAL HYSTERECTOMY    . APPENDECTOMY    . BACK SURGERY     thoracic-fractured   . BACK SURGERY     lumbar - fractured  . BREAST LUMPECTOMY WITH RADIOACTIVE SEED LOCALIZATION Right 08/01/2018   Procedure: RADIOACTIVE SEED GUIDED RIGHT BREAST LUMPECTOMY;  Surgeon: Coralie Keens, MD;  Location: [facility];  Service: General;  Laterality: Right;  . ESOPHAGOGASTRODUODENOSCOPY  05/20/2008   Mild to moderate esophagitis. Otherwise normal EGD.   Marland Kitchen EYE SURGERY Left    cataract surgery with lens implant  . GASTROSTOMY     peptic ulcer - peg tube placement    Current Medications: Current Meds  Medication Sig  . acetaminophen (TYLENOL) 650 MG CR tablet Take 650 mg by mouth every 6 (six) hours as needed for pain.  . bisacodyl (DULCOLAX) 5 MG EC tablet Take 15 mg by mouth at bedtime as needed for moderate constipation.  . clopidogrel (PLAVIX) 75 MG tablet TAKE 1 TABLET BY MOUTH DAILY.  . cyclobenzaprine (FLEXERIL) 10 MG tablet TAKE 1 TABLET BY MOUTH EVERY 8 HOURS AS NEEDED  . diphenhydrAMINE (BENADRYL) 25 MG tablet  Take 25 mg by mouth every 6 (six) hours as needed.  . furosemide (LASIX) 20 MG tablet Take 1 tablet (20 mg total) by mouth daily.  Marland Kitchen gabapentin (NEURONTIN) 300 MG capsule Take 2 capsules (600 mg total) by mouth at bedtime AND 1 capsule (300 mg total) 2 (two) times daily.  Marland Kitchen HYDROcodone-acetaminophen (NORCO/VICODIN) 5-325 MG tablet TAKE 1 TABLET BY MOUTH EVERY 4 HOURS AS NEEDED FOR UP TO 5 DAYS FOR MODERATE PAIN.  Marland Kitchen losartan (COZAAR) 50 MG tablet Take 1 tablet (50 mg total) by mouth daily.  . nitroGLYCERIN (NITROSTAT) 0.4 MG SL tablet Place 0.4 mg under the tongue every 5 (five) minutes as needed for chest pain.   . pantoprazole (PROTONIX) 40 MG tablet Take 1 tablet (40 mg total) by mouth daily.  . promethazine (PHENERGAN) 25 MG tablet TAKE 1 TABLET BY MOUTH EVERY 6 HOURS AS NEEDED FOR NAUSEA OR VOMITING. Patient needs to call [phone] to schedule an office visit for more refills of this medication  . ranolazine (RANEXA) 500 MG 12 hr tablet TAKE 1 TABLET BY MOUTH 2 TIMES DAILY.  Marland Kitchen sertraline (ZOLOFT) 50 MG tablet Take 1 tablet (50 mg total) by mouth daily.  . traZODone (DESYREL) 100 MG tablet Take 1 tablet (100 mg total) by mouth at bedtime.  . Vitamin D, Ergocalciferol, (DRISDOL) 1.25 MG (50000 UT) CAPS capsule Take 1 capsule (50,000 Units total) by mouth every 7 (seven) days.   Current Facility-Administered Medications for the 04/02/20 encounter (Office Visit) with Park Liter, MD  Medication  . bupivacaine (MARCAINE) 0.25 % (with pres) injection 2 mL     Allergies:   Iodinated diagnostic agents, Nsaids, Latex, Sinemet [carbidopa w-levodopa], Inderal [propranolol], Indomethacin, Penicillin g, Pneumococcal vaccines, Scopolamine, and Tape   Social History   Socioeconomic History  . Marital status: Married    Spouse name: Not on file  . Number of children: 3  . Years of education: Not on file  . Highest education level: Not on file  Occupational History  . Not on file  Tobacco  Use  . Smoking status: Former Smoker    Packs/day: 1.00    Years: 22.00    Pack years: 22.00    Types: Cigarettes    Quit date: 06/26/1986    Years since quitting: 33.7  . Smokeless tobacco: Never Used  Vaping Use  . Vaping Use: Never used  Substance and Sexual Activity  . Alcohol use: No  . Drug use: No  . Sexual activity: Not Currently    Birth control/protection: None  Other Topics Concern  . Not on file  Social History Narrative  . Not on file   Social Determinants of Health   Financial Resource Strain:   .  Difficulty of Paying Living Expenses: Not on file  Food Insecurity:   . Worried About Charity fundraiser in the Last Year: Not on file  . Ran Out of Food in the Last Year: Not on file  Transportation Needs:   . Lack of Transportation (Medical): Not on file  . Lack of Transportation (Non-Medical): Not on file  Physical Activity:   . Days of Exercise per Week: Not on file  . Minutes of Exercise per Session: Not on file  Stress:   . Feeling of Stress : Not on file  Social Connections:   . Frequency of Communication with Friends and Family: Not on file  . Frequency of Social Gatherings with Friends and Family: Not on file  . Attends Religious Services: Not on file  . Active Member of Clubs or Organizations: Not on file  . Attends Archivist Meetings: Not on file  . Marital Status: Not on file     Family History: The patient's family history includes ALS in her sister; Alzheimer's disease in her father and mother; Arthritis in her mother and sister; Breast cancer in her maternal aunt, maternal aunt, and maternal aunt; Cancer in her father, maternal aunt, maternal uncle, and mother; Colon cancer in her maternal uncle and maternal uncle; Heart attack in her paternal aunt, paternal grandfather, and paternal uncle; Huntington's disease in her daughter; Hyperlipidemia in her mother; Liver disease in her maternal uncle; Pancreatic cancer in her maternal uncle;  Parkinson's disease in her father; Stomach cancer in her paternal aunt; Stroke in her paternal aunt and paternal grandmother. There is no history of Esophageal cancer or Rectal cancer. ROS:   Please see the history of present illness.    All 14 point review of systems negative except as described per history of present illness  EKGs/Labs/Other Studies Reviewed:      Recent Labs: 06/18/2019: TSH 0.254 09/17/2019: ALT 12; Hemoglobin 13.0; Platelets 137.0 10/17/2019: NT-Pro BNP 33; Potassium 3.9; Sodium 139 12/17/2019: BUN 9 03/08/2020: Creatinine, Ser 0.60  Recent Lipid Panel    Component Value Date/Time   CHOL 191 06/18/2019 1009   TRIG 87 06/18/2019 1009   HDL 54 06/18/2019 1009   CHOLHDL 3.5 06/18/2019 1009   LDLCALC 121 (H) 06/18/2019 1009    Physical Exam:    VS:  BP 124/64   Pulse 91   Ht 5' (1.524 m)   Wt 152 lb 0.6 oz (69 kg)   SpO2 92%   BMI 29.69 kg/m     Wt Readings from Last 3 Encounters:  04/02/20 152 lb 0.6 oz (69 kg)  03/23/20 169 lb (76.7 kg)  03/04/20 156 lb (70.8 kg)     GEN:  Well nourished, well developed in no acute distress HEENT: Normal NECK: No JVD; No carotid bruits LYMPHATICS: No lymphadenopathy CARDIAC: RRR, no murmurs, no rubs, no gallops RESPIRATORY:  Clear to auscultation without rales, wheezing or rhonchi  ABDOMEN: Soft, non-tender, non-distended MUSCULOSKELETAL:  No edema; No deformity  SKIN: Warm and dry LOWER EXTREMITIES: no swelling NEUROLOGIC:  Alert and oriented x 3 PSYCHIATRIC:  Normal affect   ASSESSMENT:    1. Primary hypertension   2. Coronary artery disease involving native coronary artery of native heart without angina pectoris   3. Dyspnea on exertion   4. Dyslipidemia    PLAN:    In order of problems listed above:  1. Primary hypertension: Blood pressure well controlled continue present management. 2. Coronary artery disease had a long discussion with  her about finding on the coronary CT angio.  She was not a  candidate for fractional flow reserve because of some respiratory artifact daily.  We elected to treat this with medications.  I told her that the ultimate test would be cardiac catheterization however she is willing to try medications first.  She is already on antiplatelet agent which is Plavix which I will continue.  I will add ranolazine 5 1 mg twice daily to her medical regimen.  Also today I will schedule her to have fasting lipid profile as well as a Chem-7 and proBNP. 3. Dyspnea on exertion: Multifactorial, however her asbestos exposure as well as history of smoking may be concerning she will be referred to pulmonary for evaluation she most likely required pulmonary function test. 4. Dyslipidemia now with proven coronary artery disease reduction of her cholesterol is absolutely essential.  She need to be on moderate intensity statin.  I will check her complete metabolic panel as well as cholesterol today and then appropriate medication will be initiated.   Medication Adjustments/Labs and Tests Ordered: Current medicines are reviewed at length with the patient today.  Concerns regarding medicines are outlined above.  No orders of the defined types were placed in this encounter.  Medication changes: No orders of the defined types were placed in this encounter.   Signed, Park Liter, MD, [facility] 04/02/2020 8:57 AM    [place]

## 2020-04-02 NOTE — Telephone Encounter (Signed)
° ° °  Pt c/o medication issue:  1. Name of Medication: ranolazine (RANEXA) 1000 MG SR tablet  2. How are you currently taking this medication (dosage and times per day)? Take 1 tablet (1,000 mg total) by mouth 2 (two) times daily.  3. Are you having a reaction (difficulty breathing--STAT)?   4. What is your medication issue? [facility] calling, they would like to verify dosage of this medications

## 2020-04-03 LAB — COMPREHENSIVE METABOLIC PANEL
ALT: 11 IU/L (ref 0–32)
AST: 13 IU/L (ref 0–40)
Albumin/Globulin Ratio: 2.3 — ABNORMAL HIGH (ref 1.2–2.2)
Albumin: 4.9 g/dL — ABNORMAL HIGH (ref 3.7–4.7)
Alkaline Phosphatase: 80 IU/L (ref 44–121)
BUN/Creatinine Ratio: 9 — ABNORMAL LOW (ref 12–28)
BUN: 7 mg/dL — ABNORMAL LOW (ref 8–27)
Bilirubin Total: 0.7 mg/dL (ref 0.0–1.2)
CO2: 27 mmol/L (ref 20–29)
Calcium: 9.4 mg/dL (ref 8.7–10.3)
Chloride: 99 mmol/L (ref 96–106)
Creatinine, Ser: 0.76 mg/dL (ref 0.57–1.00)
GFR calc Af Amer: 91 mL/min/{1.73_m2} (ref >59)
GFR calc non Af Amer: 79 mL/min/{1.73_m2} (ref >59)
Globulin, Total: 2.1 g/dL (ref 1.5–4.5)
Glucose: 91 mg/dL (ref 65–99)
Potassium: 3.2 mmol/L — ABNORMAL LOW (ref 3.5–5.2)
Sodium: 138 mmol/L (ref 134–144)
Total Protein: 7 g/dL (ref 6.0–8.5)

## 2020-04-03 LAB — LIPID PANEL
Chol/HDL Ratio: 3 ratio (ref 0.0–4.4)
Cholesterol, Total: 170 mg/dL (ref 100–199)
HDL: 57 mg/dL (ref >39)
LDL Chol Calc (NIH): 99 mg/dL (ref 0–99)
Triglycerides: 71 mg/dL (ref 0–149)
VLDL Cholesterol Cal: 14 mg/dL (ref 5–40)

## 2020-04-03 LAB — PRO B NATRIURETIC PEPTIDE: NT-Pro BNP: 67 pg/mL (ref 0–301)

## 2020-04-05 ENCOUNTER — Telehealth: Payer: Self-pay | Admitting: Emergency Medicine

## 2020-04-05 DIAGNOSIS — E785 Hyperlipidemia, unspecified: Secondary | ICD-10-CM

## 2020-04-05 MED ORDER — POTASSIUM CHLORIDE ER 10 MEQ PO TBCR: 10.0000 meq | EXTENDED_RELEASE_TABLET | Freq: Every day | ORAL | 1 refills | Status: DC

## 2020-04-05 MED ORDER — ATORVASTATIN CALCIUM 10 MG PO TABS: 10.0000 mg | ORAL_TABLET | Freq: Every day | ORAL | 1 refills | Status: DC

## 2020-04-05 NOTE — Telephone Encounter (Signed)
-----   Message from Park Liter, MD sent at 04/03/2020 11:09 AM EDT ----- Her potassium is low, please add potassium 10 mg daily, also her cholesterol is not already supposed to be.  Please add Lipitor 10 mg daily, she is to have fasting lipid profile 6 weeks, Chem-7 within next 10 days

## 2020-04-05 NOTE — Telephone Encounter (Signed)
Called patient. Informed her of results. Advised her to start potassium 10 meq daily and lipitor 10 mg daily. She verbally understood. She will have labs redrawn in 6 weeks. No further questions.

## 2020-04-09 ENCOUNTER — Encounter: Payer: Self-pay | Admitting: Gastroenterology

## 2020-04-09 ENCOUNTER — Ambulatory Visit (INDEPENDENT_AMBULATORY_CARE_PROVIDER_SITE_OTHER): Payer: Medicare Other | Admitting: Gastroenterology

## 2020-04-09 VITALS — BP 128/72 | HR 99 | Ht 60.0 in | Wt 151.5 lb

## 2020-04-09 DIAGNOSIS — R110 Nausea: Secondary | ICD-10-CM | POA: Diagnosis not present

## 2020-04-09 DIAGNOSIS — K5909 Other constipation: Secondary | ICD-10-CM | POA: Diagnosis not present

## 2020-04-09 MED ORDER — PROMETHAZINE HCL 25 MG PO TABS: 25.0000 mg | ORAL_TABLET | Freq: Four times a day (QID) | ORAL | 0 refills | Status: DC | PRN

## 2020-04-09 NOTE — Patient Instructions (Signed)
You have been scheduled for a gastric emptying scan at [facility] on 04/30/20 at [place]. Please arrive at least 15 minutes prior to your appointment for registration. Please make certain not to have anything to eat or drink after midnight the night before your test. Hold all stomach medications (ex: Zofran, phenergan, Reglan) 48 hours prior to your test. If you need to reschedule your appointment, please contact radiology scheduling at [phone]. _____________________________________________________________________ A gastric-emptying study measures how long it takes for food to move through your stomach. There are several ways to measure stomach emptying. In the most common test, you eat food that contains a small amount of radioactive material. A scanner that detects the movement of the radioactive material is placed over your abdomen to monitor the rate at which food leaves your stomach. This test normally takes about 4 hours to complete. _____________________________________________________________________  Your provider has requested that you go to the basement level for lab work at [facility] in [place] [address]. Press "B" on the elevator. The lab is located at the first door on the left as you exit the elevator.  Follow up in 24 weeks   Call in 2 weeks to report how you are doing  Thank you,  Dr. Jackquline Denmark

## 2020-04-09 NOTE — Progress Notes (Signed)
IMPRESSION and PLAN:    #1. N/V with RUQ pain. Failed zofran. Better with phenergan. US showed gallstone 08/2019, neg HIDA for acute cholecystitis. Neg EGD. Still can have biliary colic, due to medications, r/o gastroparesis, functional.  #2. GERD with dysphagia (resolved after dil 06/2019) d/t distal eso stricture, 2 cm HH. Neg Bx for EoE. H/O food impaction 2003 s/p endoscopic disimpaction. Has component of oropharyngeal dysphagia d/t ?  Parkinson's.  Had PEJ 2012 (removed 2017).   #3. Colorectal cancer screening: Colonoscopy 06/2019: 6 mm TA s/p polypectomy, mild sig div, highly redundant colon. Rpt colon only if any problems.  #4. 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 Likely d/t NASH. No ETOH, neg Hep C. No varices on EGD 06/2019 or ascites on [place] 02/2018. CT AP July 2021 neg for any masses. No HE. Nl AFP  #6. Chronic constipation. Excerberated by pain meds. Failed miralax, colace. Better with 3 dulcolax/day. Neg colon 06/2019. Occ rectal bleeding d/t hoids.  #7. Asymptomatic cholelithiasis. Neg HIDA 09/2019 for acute cholecystitis.   Plan: -Continue Protonix 40mg  po qd. -Continue Phenergan 25mg  po QID prn #45. Sedation and fall precautions were discussed. -Proceed with solid-phase GES. -CBC, CMP, PT INR, lipase -Continue dulcolax. -Minimize pain medications. -FU in 24 weeks. If still with problems, may need ref to Sx for possible lap chole (Dr Lovie Macadamia). She will call us and let us know.  HPI:    Chief Complaint:   Amy Moon is a 69 y.o. female  For follow-up visit.  Continues to have RUQ pain radiating to the back with N/V. She has not eating well and has lost wt as detailed below.  Having neck problems and is diagnosed as having cervical radiculopathy.  She is currently on Vicodin 2/day.  She may require neck surgery.  Underwent RUQ [place] which showed gallstone.  She had  positive Murphy's sign ultrasonographlly Thereafter underwent HIDA scan which was neg for cholecystitis.  Most recently underwent CT Abdo/pelvis 12/25/2019 which was negative for any acute abnormalities.  Nausea is better with Phenergan.  No jaundice dark urine or pale stools.  No fever chills or night sweats.   Her constipation did get worse but is currently better with Dulcolax.  She would occasionally have rectal bleeding attributed to hemorrhoids.  Denies any change in mental status.  No history of easy bruisability.  Wt Readings from Last 3 Encounters:  04/09/20 151 lb 8 oz (68.7 kg)  04/02/20 152 lb 0.6 oz (69 kg)  03/23/20 169 lb (76.7 kg)     Underwent EGD and colonoscopy January 2021  Past GI procedures: EGD: 07/16/2019 distal esophageal stricture s/p dilatation, grade A esophagitis, small hiatal hernia.  Colonoscopy January/2021 6 mm colonic polyp SP polypectomy, mild sigmoid diverticulosis.  Otherwise normal to TI.  Biopsies showed tubular adenoma.  Repeat colonoscopy in 7 years.  CT AP with contrast 12/25/2019 1. No acute findings within the abdomen or pelvis. 2. Hepatic cirrhosis and findings of portal venous hypertension. No evidence of hepatic neoplasm.  No ascites.  Mild splenomegaly. 3. Cholelithiasis. No radiographic evidence of cholecystitis.  [place] 08/2019 1. There is a 1.4 cm stone in the gallbladder. A positive Murphy's sign is reported. However, there is no wall thickening or pericholecystic fluid. Recommend clinical correlation. If there is concern for cholecystitis, recommend HIDA scan. 2. Probable hepatic steatosis. 3. No other abnormalities.   Past Medical History:  Diagnosis Date  . Angina pectoris ([place])   . Chronic kidney disease   . Drug-induced low platelet count   . Duodenal ulcer   . Granulomatous disease ([place])   . Hepatic disease   . Hypertension   . Low vitamin D level   . Neuropathy   . Osteopenia   . Parkinson's disease ([place])       .  Splenomegaly a    Current Outpatient Medications  Medication Sig Dispense Refill  . acetaminophen (TYLENOL) 650 MG CR tablet Take 650 mg by mouth every 6 (six) hours as needed for pain.    Marland Kitchen atorvastatin (LIPITOR) 10 MG tablet Take 1 tablet (10 mg total) by mouth daily. 90 tablet 1  . bisacodyl (DULCOLAX) 5 MG EC tablet Take 15 mg by mouth at bedtime as needed for moderate constipation.    . clopidogrel (PLAVIX) 75 MG tablet TAKE 1 TABLET BY MOUTH DAILY. 90 tablet 2  . cyclobenzaprine (FLEXERIL) 10 MG tablet TAKE 1 TABLET BY MOUTH EVERY 8 HOURS AS NEEDED 30 tablet 0  . diphenhydrAMINE (BENADRYL) 25 MG tablet Take 25 mg by mouth every 6 (six) hours as needed.    . furosemide (LASIX) 20 MG tablet Take 1 tablet (20 mg total) by mouth daily. 90 tablet 1  . gabapentin (NEURONTIN) 300 MG capsule Take 2 capsules (600 mg total) by mouth at bedtime AND 1 capsule (300 mg total) 2 (two) times daily. 120 capsule 6  . HYDROcodone-acetaminophen (NORCO/VICODIN) 5-325 MG tablet TAKE 1 TABLET BY MOUTH EVERY 4 HOURS AS NEEDED FOR UP TO 5 DAYS FOR MODERATE PAIN. 30 tablet 0  . losartan (COZAAR) 50 MG tablet Take 1 tablet (50 mg total) by mouth daily. 90 tablet 3  . nitroGLYCERIN (NITROSTAT) 0.4 MG SL tablet Place 0.4 mg under the tongue every 5 (five) minutes as needed for chest pain.     . pantoprazole (PROTONIX) 40 MG tablet Take 1 tablet (40 mg total) by mouth daily. 30 tablet 6  . potassium chloride (KLOR-CON) 10 MEQ tablet Take 1 tablet (10 mEq total) by mouth daily. 90 tablet 1  . promethazine (PHENERGAN) 25 MG tablet TAKE 1 TABLET BY MOUTH EVERY 6 HOURS AS NEEDED FOR NAUSEA OR VOMITING. Patient needs to call [phone] to schedule an office visit for more refills of this medication 30 tablet 0  . ranolazine (RANEXA) 1000 MG SR tablet Take 1 tablet (1,000 mg total) by mouth 2 (two) times daily. 60 tablet 1  . sertraline (ZOLOFT) 50 MG tablet Take 1 tablet (50 mg total) by mouth daily. 30 tablet 1  .  traZODone (DESYREL) 100 MG tablet Take 1 tablet (100 mg total) by mouth at bedtime. 30 tablet 1  . metoprolol tartrate (LOPRESSOR) 50 MG tablet Take 1 tablet (50 mg total) by mouth once for 1 dose. Take 1 tablet (50mg ) 2 hr prior to CT appt (Patient not taking: Reported on 04/09/2020) 1 tablet 0  . Vitamin D, Ergocalciferol, (DRISDOL) 1.25 MG (50000 UT) CAPS capsule Take 1 capsule (50,000 Units total) by mouth every 7 (seven) days. (Patient not taking: Reported on 04/09/2020) 16 capsule 0   Current Facility-Administered Medications  Medication Dose Route Frequency Provider Last Rate Last Admin  . bupivacaine (MARCAINE) 0.25 % (with pres) injection 2 mL  2 mL Other Once Magnus Sinning, MD        Past Surgical History:  Procedure Laterality Date  . ABDOMINAL HYSTERECTOMY    . APPENDECTOMY    . BACK  SURGERY     thoracic-fractured   . BACK SURGERY     lumbar - fractured  . BREAST LUMPECTOMY WITH RADIOACTIVE SEED LOCALIZATION Right 08/01/2018   Procedure: RADIOACTIVE SEED GUIDED RIGHT BREAST LUMPECTOMY;  Surgeon: Coralie Keens, MD;  Location: [facility];  Service: General;  Laterality: Right;  . ESOPHAGOGASTRODUODENOSCOPY  05/20/2008   Mild to moderate esophagitis. Otherwise normal EGD.   Marland Kitchen EYE SURGERY Left    cataract surgery with lens implant  . GASTROSTOMY     peptic ulcer - peg tube placement    Family History  Problem Relation Age of Onset  . Cancer Mother        ovarian  . Hyperlipidemia Mother   . Alzheimer's disease Mother   . Arthritis Mother        rheumatoid  . Cancer Father        lung  . Parkinson's disease Father   . Alzheimer's disease Father   . ALS Sister   . Cancer Maternal Aunt        pancreatic, lung, liver,breast  . Breast cancer Maternal Aunt   . Cancer Maternal Uncle        pancreatic, liver, brain  . Colon cancer Maternal Uncle   . Heart attack Paternal Aunt   . Stroke Paternal Aunt   . Stomach cancer Paternal Aunt   . Heart attack Paternal Uncle     . Stroke Paternal Grandmother   . Heart attack Paternal Grandfather   . Arthritis Sister        rheumatoid  . Huntington's disease Daughter        presumed inherited from father per patient  . Colon cancer Maternal Uncle   . Pancreatic cancer Maternal Uncle   . Liver disease Maternal Uncle   . Breast cancer Maternal Aunt   . Breast cancer Maternal Aunt   . Esophageal cancer Neg Hx   . Rectal cancer Neg Hx     Social History   Tobacco Use  . Smoking status: Former Smoker    Packs/day: 1.00    Years: 22.00    Pack years: 22.00    Types: Cigarettes    Quit date: 06/26/1986    Years since quitting: 33.8  . Smokeless tobacco: Never Used  Vaping Use  . Vaping Use: Never used  Substance Use Topics  . Alcohol use: No  . Drug use: No    Allergies  Allergen Reactions  . Iodinated Diagnostic Agents Hives  . Nsaids Other (See Comments)    History of bleeding ulcers  . Latex Hives and Rash  . Sinemet [Carbidopa W-Levodopa] Nausea And Vomiting  . Inderal [Propranolol] Other (See Comments)    Unknown  . Indomethacin Other (See Comments)    Unknown  . Penicillin G Other (See Comments)    Unknown  . Pneumococcal Vaccines Other (See Comments)    Caused "pneumonia"  . Scopolamine Other (See Comments)    The patch, caused her to pass out / change in mental status  . Tape Other (See Comments)    Unknown     Review of Systems: All systems reviewed and negative except where noted in HPI.    Physical Exam:     BP 128/72   Pulse 99   Ht 5' (1.524 m)   Wt 151 lb 8 oz (68.7 kg)   BMI 29.59 kg/m  Gen: awake, alert, NAD HEENT: anicteric, no pallor CV: RRR, no mrg Pulm: CTA b/l Abd: soft, NT/ND, +BS throughout Ext:  no c/c/e Neuro: nonfocal  . CBC Latest Ref Rng & Units 09/17/2019 06/18/2019 07/25/2018  WBC 4.0 - 10.5 K/uL 4.9 5.4 4.8  Hemoglobin 12.0 - 15.0 g/dL 13.0 13.9 12.5  Hematocrit 36 - 46 % 38.2 39.5 37.6  Platelets 150 - 400 K/uL 137.0(L) 166 122(L)   CMP  Latest Ref Rng & Units 04/02/2020 03/08/2020 12/17/2019  Glucose 65 - 99 mg/dL 91 - -  BUN 8 - 27 mg/dL 7(L) - 9  Creatinine 0.57 - 1.00 mg/dL 0.76 0.60 0.68  Sodium 134 - 144 mmol/L 138 - -  Potassium 3.5 - 5.2 mmol/L 3.2(L) - -  Chloride 96 - 106 mmol/L 99 - -  CO2 20 - 29 mmol/L 27 - -  Calcium 8.7 - 10.3 mg/dL 9.4 - -  Total Protein 6.0 - 8.5 g/dL 7.0 - -  Total Bilirubin 0.0 - 1.2 mg/dL 0.7 - -  Alkaline Phos 44 - 121 IU/L 80 - -  AST 0 - 40 IU/L 13 - -  ALT 0 - 32 IU/L 11 - -        Siegfried Vieth,MD 04/09/2020, 2:33 PM   CC Lorrene Reid, PA-C

## 2020-04-12 ENCOUNTER — Other Ambulatory Visit: Payer: Self-pay | Admitting: Specialist

## 2020-04-12 NOTE — Telephone Encounter (Signed)
Please advise 

## 2020-04-19 ENCOUNTER — Other Ambulatory Visit: Payer: Self-pay | Admitting: Specialist

## 2020-04-26 ENCOUNTER — Other Ambulatory Visit: Payer: Self-pay | Admitting: Physician Assistant

## 2020-04-26 ENCOUNTER — Other Ambulatory Visit: Payer: Self-pay | Admitting: Specialist

## 2020-04-26 ENCOUNTER — Telehealth: Payer: Self-pay | Admitting: Gastroenterology

## 2020-04-26 DIAGNOSIS — R110 Nausea: Secondary | ICD-10-CM

## 2020-04-26 DIAGNOSIS — G4700 Insomnia, unspecified: Secondary | ICD-10-CM

## 2020-04-26 MED ORDER — PROMETHAZINE HCL 25 MG PO TABS: 25.0000 mg | ORAL_TABLET | Freq: Four times a day (QID) | ORAL | 0 refills | Status: DC | PRN

## 2020-04-26 NOTE — Telephone Encounter (Signed)
Sent refill to patients pharmacy. 

## 2020-04-30 ENCOUNTER — Encounter (HOSPITAL_COMMUNITY): Payer: Medicare Other

## 2020-05-04 ENCOUNTER — Other Ambulatory Visit: Payer: Self-pay | Admitting: Specialist

## 2020-05-10 ENCOUNTER — Other Ambulatory Visit: Payer: Self-pay | Admitting: Specialist

## 2020-05-17 ENCOUNTER — Other Ambulatory Visit: Payer: Self-pay | Admitting: Specialist

## 2020-05-18 ENCOUNTER — Encounter: Payer: Self-pay | Admitting: Physician Assistant

## 2020-05-18 ENCOUNTER — Other Ambulatory Visit: Payer: Self-pay

## 2020-05-18 ENCOUNTER — Ambulatory Visit (INDEPENDENT_AMBULATORY_CARE_PROVIDER_SITE_OTHER): Payer: Medicare Other | Admitting: Physician Assistant

## 2020-05-18 VITALS — BP 162/84 | Ht 60.0 in | Wt 143.0 lb

## 2020-05-18 DIAGNOSIS — I10 Essential (primary) hypertension: Secondary | ICD-10-CM

## 2020-05-18 DIAGNOSIS — G4700 Insomnia, unspecified: Secondary | ICD-10-CM

## 2020-05-18 DIAGNOSIS — Z0000 Encounter for general adult medical examination without abnormal findings: Secondary | ICD-10-CM

## 2020-05-18 DIAGNOSIS — F419 Anxiety disorder, unspecified: Secondary | ICD-10-CM

## 2020-05-18 DIAGNOSIS — F32A Depression, unspecified: Secondary | ICD-10-CM | POA: Diagnosis not present

## 2020-05-18 MED ORDER — SERTRALINE HCL 100 MG PO TABS: 100.0000 mg | ORAL_TABLET | Freq: Every day | ORAL | 1 refills | Status: DC

## 2020-05-18 MED ORDER — ESZOPICLONE 1 MG PO TABS: 1.0000 mg | ORAL_TABLET | Freq: Every evening | ORAL | 1 refills | Status: DC | PRN

## 2020-05-18 NOTE — Progress Notes (Signed)
Telehealth office visit note for Amy Reid, PA-C- at [facility]   I connected with current patient today by telephone and verified that I am speaking with the correct person   . Location of the patient: Home . Location of the provider: Office - This visit type was conducted due to national recommendations for restrictions regarding the COVID-19 Pandemic (e.g. social distancing) in an effort to limit this patient's exposure and mitigate transmission in our community.    - No physical exam could be performed with this format, beyond that communicated to [place] by the patient/ family members as noted.   - Additionally my office staff/ schedulers were to discuss with the patient that there may be a monetary charge related to this service, depending on their medical insurance.  My understanding is that patient understood and consented to proceed.     _________________________________________________________________________________   History of Present Illness: Patient calls in to follow up on mood and insomnia. Reports has not noticed a change with mood with increased dose of Zoloft. Not sleeping better despite taking Trazodone 100 mg. Is getting about 3-4 hours of sleep. Not getting enough sleep affects her energy and mood.  HTN: Pt denies new onset chest pain, palpitations, dizziness or lower extremity swelling. Taking medication as directed without side effects. Checks BP at home and readings usually range 120-132/80s. Pt states today's BP is elevated which is unusual.       GAD 7 : Generalized Anxiety Score 12/22/2019  Nervous, Anxious, on Edge 3  Control/stop worrying 1  Worry too much - different things 3  Trouble relaxing 3  Restless 1  Easily annoyed or irritable 0  Afraid - awful might happen 1  Total GAD 7 Score 12  Anxiety Difficulty Somewhat difficult    Depression screen [facility] 2/9 05/18/2020 03/23/2020 12/22/2019 06/17/2019 06/03/2018  Decreased  Interest 2 1 1 1 1   Down, Depressed, Hopeless 3 - 1 1 2   PHQ - 2 Score 5 1 2 2 3   Altered sleeping 3 3 3 3 3   Tired, decreased energy 3 3 1 1 3   Change in appetite 0 1 0 1 3  Feeling bad or failure about yourself  1 0 1 1 2   Trouble concentrating 1 1 1 1 2   Moving slowly or fidgety/restless 0 0 1 3 2   Suicidal thoughts 0 0 0 0 0  PHQ-9 Score 13 9 9 12 18   Difficult doing work/chores Very difficult Somewhat difficult Somewhat difficult Somewhat difficult Very difficult  Some recent data might be hidden      Impression and Recommendations:     1. Insomnia, unspecified type   2. Anxiety and depression   3. Hypertension, unspecified type   4. Healthcare maintenance     Anxiety and depression: -PHQ-9 score of 13, increased from prior (9). Denies SI/HI. -Discussed with patient increasing Zoloft to 100 mg to help improve symptoms, patient is agreeable. Advised to let me know if unable to tolerate increased dose. Also had thorough discussion on alternative options if symptoms fail to improve or worsen such as adjunct therapy or alternative medication. -Improving sleep with likely help improve mood as well. -Follow up in 8 weeks to reassess symptoms and medication therapy.  Insomnia: -Has not improved with increased dose of Trazodone. Discussed with patient alternative treatment options including side effects and is agreeable to trial Lunesta. Advised to let me know if unable to tolerate medication. Discontinued Trazodone and  instructed patient not take Trazodone or Lunesta together. -Recommend to continue to avoid caffeine and establish a good sleep hygiene. -Follow up in 8 weeks to reassess sleep and medication therapy.  Hypertension: -BP elevated today. Prior BP readings at goal. Continue current medication regimen. -Advised to continue ambulatory BP/pulse monitoring and notify the office if BP remains consistently >140/90.  -Recommend to stay well hydrated. -Will continue to  monitor alongside cardiology.   Healthcare Maintenance: -Advised to schedule lab appointment for FBW including Vit D. -Will recheck BP at lab appointment.      - As part of my medical decision making, I reviewed the following data within the [place] History obtained from pt /family, CMA notes reviewed and incorporated if applicable, Labs reviewed, Radiograph/ tests reviewed if applicable and OV notes from prior OV's with me, as well as any other specialists she/he has seen since seeing me last, were all reviewed and used in my medical decision making process today.    - Additionally, when appropriate, discussion had with patient regarding our treatment plan, and their biases/concerns about that plan were used in my medical decision making today.    - The patient agreed with the plan and demonstrated an understanding of the instructions.   No barriers to understanding were identified.     - The patient was advised to call back or seek an in-person evaluation if the symptoms worsen or if the condition fails to improve as anticipated.   Return in about 8 weeks (around 07/13/2020) for Mood- inc med, Insomnia- changed med; lab visit for FBW + Vit D/recheck BP in 1-2 weeks.    No orders of the defined types were placed in this encounter.   Meds ordered this encounter  Medications  . eszopiclone (LUNESTA) 1 MG TABS tablet    Sig: Take 1 tablet (1 mg total) by mouth at bedtime as needed for sleep. Take immediately before bedtime    Dispense:  30 tablet    Refill:  1    Order Specific Question:   Supervising Provider    Answer:   Beatrice Lecher D [[id number]]  . sertraline (ZOLOFT) 100 MG tablet    Sig: Take 1 tablet (100 mg total) by mouth daily.    Dispense:  30 tablet    Refill:  1    Order Specific Question:   Supervising Provider    Answer:   Beatrice Lecher D [[id number]]    Medications Discontinued During This Encounter  Medication Reason  . sertraline (ZOLOFT)  50 MG tablet Dose change  . traZODone (DESYREL) 100 MG tablet Change in therapy  . promethazine (PHENERGAN) 25 MG tablet Duplicate       Time spent on visit including pre-visit chart review and post-visit care was 22 minutes.      The [place] was signed into law in 2016 which includes the topic of electronic health records.  This provides immediate access to information in MyChart.  This includes consultation notes, operative notes, office notes, lab results and pathology reports.  If you have any questions about what you read please let us know at your next visit or call us at the office.  We are right here with you.  Note:  This note was prepared with assistance of Dragon voice recognition software. Occasional wrong-word or sound-a-like substitutions may have occurred due to the inherent limitations of voice recognition software.  __________________________________________________________________________________     Patient Care Team    Relationship Specialty  Notifications Start End  Amy Moon, [place] PCP - General Physician Assistant  12/22/19   Park Liter, MD PCP - Cardiology Cardiology Admissions 07/01/18   Jackquline Denmark, MD Consulting Physician Gastroenterology  05/09/18      -Vitals obtained; medications/ allergies reconciled;  personal medical, social, Sx etc.histories were updated by CMA, reviewed by me and are reflected in chart   Patient Active Problem List   Diagnosis Date Noted  . Coronary artery disease 04/02/2020  . Dyslipidemia 04/02/2020  . Splenomegaly   . Portal hypertension ([place])   . Pneumonia   . Osteoporosis   . Osteopenia   . Neuropathy   . Low vitamin D level   . History of kidney stones   . Hiatal hernia   . Hepatic disease   . Granulomatous disease ([place])   . GERD (gastroesophageal reflux disease)   . Enlarged thyroid   . Duodenal ulcer   . Drug-induced low platelet count   . Depression   . Clotting disorder ([place])    . Cirrhosis ([place])   . Chronic kidney disease   . Cataract   . Cancer ([place])   . Asthma   . Arthritis   . Angina pectoris ([place])   . Anemia   . Allergy   . Stage 3 chronic kidney disease ([place]) 10/21/2019  . History of smoking 25-50 pack years- quit age 78, smoked 2ppd from 69 yo-69 yo. 10/21/2019  . History of surgery- pt has several Sx- no personal issues wiht anesthesia  10/21/2019  . Sleep disturbance 10/21/2019  . History of exposure to asbestos 10/21/2019  . Family history of adverse reaction to anesthesia   . Dyspnea on exertion 10/17/2019  . Bronchitis 06/03/2018  . Encounter for Medicare annual wellness exam 05/09/2018  . Vitamin D deficiency 11/05/2017  . Encounter for hepatitis C virus screening test for high risk patient 11/05/2017  . Dysphagia 09/03/2017  . Thrombocytopenia ([place]) 09/03/2017  . Healthcare maintenance 07/30/2017  . Chronic pain syndrome 07/30/2017  . Chest pain 07/30/2017  . Parkinson's disease ([place]) 07/30/2017  . Hypertension 07/30/2017  . Severe obesity (BMI >= 40) ([place]) 05/15/2016  . Severe episode of recurrent major depressive disorder, without psychotic features ([place]) 05/15/2016  . Tremor of both hands 05/15/2016  . Nausea 03/01/2016  . Irritant contact dermatitis 08/25/2015     Current Meds  Medication Sig  . acetaminophen (TYLENOL) 650 MG CR tablet Take 650 mg by mouth every 6 (six) hours as needed for pain.  Marland Kitchen atorvastatin (LIPITOR) 10 MG tablet Take 1 tablet (10 mg total) by mouth daily.  . bisacodyl (DULCOLAX) 5 MG EC tablet Take 15 mg by mouth at bedtime as needed for moderate constipation.  . clopidogrel (PLAVIX) 75 MG tablet TAKE 1 TABLET BY MOUTH DAILY.  . cyclobenzaprine (FLEXERIL) 10 MG tablet TAKE 1 TABLET BY MOUTH EVERY 8 HOURS AS NEEDED  . diphenhydrAMINE (BENADRYL) 25 MG tablet Take 25 mg by mouth every 6 (six) hours as needed.  . furosemide (LASIX) 20 MG tablet Take 1 tablet (20 mg total) by mouth daily.  Marland Kitchen gabapentin  (NEURONTIN) 300 MG capsule Take 2 capsules (600 mg total) by mouth at bedtime AND 1 capsule (300 mg total) 2 (two) times daily.  Marland Kitchen HYDROcodone-acetaminophen (NORCO/VICODIN) 5-325 MG tablet TAKE 1 TABLET BY MOUTH EVERY 4 HOURS AS NEEDED FOR UP TO 5 DAYS FOR MODERATE PAIN.  Marland Kitchen losartan (COZAAR) 50 MG tablet Take 1 tablet (50 mg total) by mouth daily.  . metoprolol  tartrate (LOPRESSOR) 50 MG tablet Take 1 tablet (50 mg total) by mouth once for 1 dose. Take 1 tablet (50mg ) 2 hr prior to CT appt  . nitroGLYCERIN (NITROSTAT) 0.4 MG SL tablet Place 0.4 mg under the tongue every 5 (five) minutes as needed for chest pain.   . pantoprazole (PROTONIX) 40 MG tablet Take 1 tablet (40 mg total) by mouth daily.  . potassium chloride (KLOR-CON) 10 MEQ tablet Take 1 tablet (10 mEq total) by mouth daily.  . promethazine (PHENERGAN) 25 MG tablet TAKE 1 TABLET BY MOUTH EVERY 6 HOURS AS NEEDED FOR NAUSEA OR VOMITING. Patient needs to call [phone] to schedule an office visit for more refills of this medication  . ranolazine (RANEXA) 1000 MG SR tablet Take 1 tablet (1,000 mg total) by mouth 2 (two) times daily.  . [DISCONTINUED] sertraline (ZOLOFT) 50 MG tablet Take 1 tablet (50 mg total) by mouth daily.  . [DISCONTINUED] traZODone (DESYREL) 100 MG tablet TAKE 1 TABLET (100 MG TOTAL) BY MOUTH AT BEDTIME.   Current Facility-Administered Medications for the 05/18/20 encounter (Office Visit) with Amy Reid, PA-C  Medication  . bupivacaine (MARCAINE) 0.25 % (with pres) injection 2 mL     Allergies:  Allergies  Allergen Reactions  . Iodinated Diagnostic Agents Hives  . Nsaids Other (See Comments)    History of bleeding ulcers  . Latex Hives and Rash  . Sinemet [Carbidopa W-Levodopa] Nausea And Vomiting  . Inderal [Propranolol] Other (See Comments)    Unknown  . Indomethacin Other (See Comments)    Unknown  . Penicillin G Other (See Comments)    Unknown  . Pneumococcal Vaccines Other (See Comments)     Caused "pneumonia"  . Scopolamine Other (See Comments)    The patch, caused her to pass out / change in mental status  . Tape Other (See Comments)    Unknown     ROS:  See above HPI for pertinent positives and negatives   Objective:   Blood pressure (!) 162/84, height 5' (1.524 m), weight 143 lb (64.9 kg).  (if some vitals are omitted, this means that patient was UNABLE to obtain them. They were asked to call us at their earliest convenience with these once obtained. ) General: A & O * 3; sounds in no acute distress Respiratory: speaking in full sentences, no conversational dyspnea Psych: insight appears good, mood- appears normal

## 2020-05-24 ENCOUNTER — Other Ambulatory Visit: Payer: Self-pay | Admitting: Specialist

## 2020-05-27 ENCOUNTER — Ambulatory Visit: Payer: Medicare Other | Admitting: Specialist

## 2020-06-01 ENCOUNTER — Other Ambulatory Visit: Payer: Self-pay | Admitting: Gastroenterology

## 2020-06-01 ENCOUNTER — Other Ambulatory Visit: Payer: Self-pay | Admitting: Specialist

## 2020-06-01 DIAGNOSIS — R110 Nausea: Secondary | ICD-10-CM

## 2020-06-07 ENCOUNTER — Other Ambulatory Visit: Payer: Self-pay | Admitting: Specialist

## 2020-06-08 ENCOUNTER — Other Ambulatory Visit: Payer: Self-pay | Admitting: Specialist

## 2020-06-16 ENCOUNTER — Other Ambulatory Visit: Payer: Self-pay | Admitting: Specialist

## 2020-06-21 ENCOUNTER — Other Ambulatory Visit: Payer: Self-pay | Admitting: Specialist

## 2020-06-24 ENCOUNTER — Other Ambulatory Visit: Payer: Self-pay | Admitting: Specialist

## 2020-06-28 ENCOUNTER — Other Ambulatory Visit: Payer: Self-pay

## 2020-06-28 ENCOUNTER — Ambulatory Visit: Payer: Self-pay

## 2020-06-28 ENCOUNTER — Encounter: Payer: Self-pay | Admitting: Specialist

## 2020-06-28 ENCOUNTER — Ambulatory Visit: Payer: Medicare Other | Admitting: Specialist

## 2020-06-28 VITALS — BP 111/76 | HR 106 | Ht 60.0 in | Wt 143.0 lb

## 2020-06-28 DIAGNOSIS — M5136 Other intervertebral disc degeneration, lumbar region: Secondary | ICD-10-CM

## 2020-06-28 DIAGNOSIS — M4802 Spinal stenosis, cervical region: Secondary | ICD-10-CM

## 2020-06-28 DIAGNOSIS — M19011 Primary osteoarthritis, right shoulder: Secondary | ICD-10-CM

## 2020-06-28 DIAGNOSIS — M170 Bilateral primary osteoarthritis of knee: Secondary | ICD-10-CM

## 2020-06-28 DIAGNOSIS — R200 Anesthesia of skin: Secondary | ICD-10-CM

## 2020-06-28 DIAGNOSIS — R202 Paresthesia of skin: Secondary | ICD-10-CM | POA: Diagnosis not present

## 2020-06-28 DIAGNOSIS — M47812 Spondylosis without myelopathy or radiculopathy, cervical region: Secondary | ICD-10-CM

## 2020-06-28 DIAGNOSIS — M5412 Radiculopathy, cervical region: Secondary | ICD-10-CM | POA: Diagnosis not present

## 2020-06-28 MED ORDER — PREGABALIN 50 MG PO CAPS: 50.0000 mg | ORAL_CAPSULE | Freq: Three times a day (TID) | ORAL | 0 refills | Status: DC

## 2020-06-28 NOTE — Progress Notes (Signed)
Office Visit Note   Patient: Amy Moon           Date of Birth: 14-Jan-1951           MRN: [id number] Visit Date: 06/28/2020              Requested by: Lorrene Reid, PA-C [place],  [place] [address] PCP: Lorrene Reid, PA-C   Assessment & Plan: Visit Diagnoses:  1. Cervical radiculopathy at C5   2. Degenerative disc disease, lumbar   3. Spondylosis without myelopathy or radiculopathy, cervical region   4. Spinal stenosis of cervical region   5. Primary osteoarthritis, right shoulder   6. Numbness and tingling in left arm   7. Bilateral primary osteoarthritis of knee     Plan: Plan: Knee is suffering from osteoarthritis, only real proven treatments are Weight loss and exercise. Well padded shoes help. Ice the knee that is suffering from osteoarthritis, only real proven treatments are Weight loss,you should not take arthritis meds due to coronary artery disease and GERD,exercise is helpful. Well padded shoes help. Ice the knee 2-3 times a day 15-20 mins at a time.-3 times a day 15-20 mins at a time. Hot showers in the AM.  Injection with steroid may be of benefit. Hemp CBD capsules, amazon.com 5,000-7,000 mg per bottle, 60 capsules per bottle, take one capsule twice a day. Cane in the left hand to use with left leg weight bearing. Follow-Up Instructions: No follow-ups on file.  Avoid overhead lifting and overhead use of the arms. Do not lift greater than 5 lbs. Adjust head rest in vehicle to prevent hyperextension if rear ended. Take extra precautions to avoid falling. Avoid bending, stooping and avoid lifting weights greater than 10 lbs. Avoid prolong standing and walking. Avoid frequent bending and stooping  No lifting greater than 10 lbs. May use ice or moist heat for pain. Weight loss is of benefit. Handicap license is approved. Dr. Romona Curls secretary/Assistant will call to arrange for EMG and NCV left arm and left leg Discontinued  Gabapentin due to leg swelling and need for diuresis Lyrica started, begin with one at night for 3 days then one tablet twice a day for 3 days then one capsule three times per day. Referral to Dr. Ninfa Linden for consideration of left TKR followed by right TKR.   Follow-Up Instructions: Return in about 4 weeks (around 07/26/2020), or Appt with Dr. Ninfa Linden for consideration of left and eventual right knee replacement..   Orders:  Orders Placed This Encounter  Procedures  . XR Cervical Spine 2 or 3 views  . XR Lumbar Spine 2-3 Views  . Ambulatory referral to Physical Medicine Rehab  . Ambulatory referral to Pain Clinic   Meds ordered this encounter  Medications  . pregabalin (LYRICA) 50 MG capsule    Sig: Take 1 capsule (50 mg total) by mouth 3 (three) times daily.    Dispense:  90 capsule    Refill:  0      Procedures: No procedures performed   Clinical Data: No additional findings.   Subjective: Chief Complaint  Patient presents with  . Neck - Follow-up  . Lower Back - Follow-up    70 year old female with history of cervical fusion and lumbar discomfort and bilateral knee osteoarthritis. She has bilateral knee varus osteoarthritis pain and deformity with bilateral left greater than right knee pain. Also pain into the left neck. Last seen EMG and NCV were Recommended but  not performed. She complains of numbness and tingling into both hands and left neck and left arm greater than the right Arm. No bowel or bladder difficulty, she is being treated for hiatal hernia, treated with diet, weight loss. She also has been seeing the cardiologist and has Calcific ASVD with coronary artery disease. She is being treated for coronary artery disease and is on plavix. She has not had any strokes or TIAs. She more recently is noticing worsening lumbar pain and radiation into the left leg about a little less than 1/2 way down. Leaning or bending increases the pain. She has a left leg limp and the  left knee wants to give away. There is no numbness in the leg but there is tingling and electrical pain into the left leg mainly the anterior lateral left thigh.    Review of Systems  Constitutional: Negative.   HENT: Negative.   Eyes: Negative.   Respiratory: Negative.   Cardiovascular: Negative.   Gastrointestinal: Negative.   Endocrine: Negative.   Genitourinary: Negative.   Musculoskeletal: Negative.   Skin: Negative.   Allergic/Immunologic: Negative.   Neurological: Negative.   Hematological: Negative.   Psychiatric/Behavioral: Negative.      Objective: Vital Signs: BP 111/76 (BP Location: Left Arm, Patient Position: Sitting)   Pulse (!) 106   Ht 5' (1.524 m)   Wt 143 lb (64.9 kg)   BMI 27.93 kg/m   Physical Exam Constitutional:      Appearance: She is well-developed and well-nourished.  HENT:     Head: Normocephalic and atraumatic.  Eyes:     Extraocular Movements: EOM normal.     Pupils: Pupils are equal, round, and reactive to light.  Pulmonary:     Effort: Pulmonary effort is normal.     Breath sounds: Normal breath sounds.  Abdominal:     General: Bowel sounds are normal.     Palpations: Abdomen is soft.  Musculoskeletal:     Cervical back: Normal range of motion and neck supple.     Lumbar back: Negative right straight leg raise test and negative left straight leg raise test.  Skin:    General: Skin is warm and dry.  Neurological:     Mental Status: She is alert and oriented to person, place, and time.  Psychiatric:        Mood and Affect: Mood and affect normal.        Behavior: Behavior normal.        Thought Content: Thought content normal.        Judgment: Judgment normal.     Back Exam   Tenderness  The patient is experiencing tenderness in the cervical and lumbar.  Range of Motion  Extension: abnormal  Flexion: abnormal  Lateral bend right: abnormal  Lateral bend left: abnormal  Rotation right: abnormal  Rotation left: abnormal    Muscle Strength  Right Quadriceps:  5/5  Left Quadriceps:  4/5  Right Hamstrings:  5/5  Left Hamstrings:  5/5   Tests  Straight leg raise right: negative Straight leg raise left: negative  Reflexes  Patellar: 1/4 Achilles: 1/4      Specialty Comments:  No specialty comments available.  Imaging: XR Lumbar Spine 2-3 Views  Result Date: 06/28/2020 AP and Lateral lumbar spine with DDD T11-12 and L1-2 through L5-S1, there is spondylosis changes through out and over 50% narrowing at each level. Straightening of the normal lordotic curve. There is a left sided scoliosis from T11 to L4 of  15 degrees. SI joints are well maintained and the hips show no significant OA.    PMFS History: Patient Active Problem List   Diagnosis Date Noted  . Coronary artery disease 04/02/2020  . Dyslipidemia 04/02/2020  . Splenomegaly   . Portal hypertension ([facility])   . Pneumonia   . Osteoporosis   . Osteopenia   . Neuropathy   . Low vitamin D level   . History of kidney stones   . Hiatal hernia   . Hepatic disease   . Granulomatous disease ([place])   . GERD (gastroesophageal reflux disease)   . Enlarged thyroid   . Duodenal ulcer   . Drug-induced low platelet count   . Depression   . Clotting disorder ([facility])   . Cirrhosis ([place])   . Chronic kidney disease   . Cataract   . Cancer ([facility] [place])   . Asthma   . Arthritis   . Angina pectoris ([facility] [facility])   . Anemia   . Allergy   . Stage 3 chronic kidney disease ([facility]) 10/21/2019  . History of smoking 25-50 pack years- quit age 1, smoked 2ppd from 70 yo-70 yo. 10/21/2019  . History of surgery- pt has several Sx- no personal issues wiht anesthesia  10/21/2019  . Sleep disturbance 10/21/2019  . History of exposure to asbestos 10/21/2019  . Family history of adverse reaction to anesthesia   . Dyspnea on exertion 10/17/2019  . Bronchitis 06/03/2018  . Encounter for Medicare annual wellness exam 05/09/2018  . Vitamin D deficiency 11/05/2017  . Encounter for  hepatitis C virus screening test for high risk patient 11/05/2017  . Dysphagia 09/03/2017  . Thrombocytopenia ([facility]) 09/03/2017  . Healthcare maintenance 07/30/2017  . Chronic pain syndrome 07/30/2017  . Chest pain 07/30/2017  . Parkinson's disease ([facility]) 07/30/2017  . Hypertension 07/30/2017  . Severe obesity (BMI >= 40) ([facility]) 05/15/2016  . Severe episode of recurrent major depressive disorder, without psychotic features ([place]) 05/15/2016  . Tremor of both hands 05/15/2016  . Nausea 03/01/2016  . Irritant contact dermatitis 08/25/2015   Past Medical History:  Diagnosis Date  . Allergy   . Anemia   . Angina pectoris ([facility])   . Arthritis   . Asthma    years ago  . Bronchitis 06/03/2018  . Cancer ([facility])    squamous cell carcinoma, nose  . Cataract   . Chest pain 07/30/2017  . Chronic kidney disease    possibly per pt  . Chronic pain syndrome 07/30/2017  . Cirrhosis ([facility])   . Clotting disorder ([facility])    platelets are low  . Depression   . Drug-induced low platelet count   . Duodenal ulcer   . Dysphagia 09/03/2017  . Dyspnea on exertion 10/17/2019  . Encounter for hepatitis C virus screening test for high risk patient 11/05/2017  . Encounter for Medicare annual wellness exam 05/09/2018  . Enlarged thyroid   . Family history of adverse reaction to anesthesia    father had difficulty waking up and breathing on his own after surgery  . GERD (gastroesophageal reflux disease)   . Granulomatous disease ([place])   . Healthcare maintenance 07/30/2017  . Hepatic disease   . Hiatal hernia   . History of exposure to asbestos 10/21/2019  . History of kidney stones   . History of smoking 25-50 pack years- quit age 77, smoked 2ppd from 70 yo-70 yo. 10/21/2019  . History of surgery- pt has several Sx- no personal issues wiht anesthesia  10/21/2019  . Hypertension   .  Irritant contact dermatitis 08/25/2015  . Low vitamin D level   . Nausea 03/01/2016  . Neuropathy   . Osteopenia   . Osteoporosis   .  Parkinson's disease ([facility])   . Pneumonia   . Portal hypertension ([facility])   . Severe episode of recurrent major depressive disorder, without psychotic features ([facility]) 05/15/2016  . Severe obesity (BMI >= 40) ([facility]) 05/15/2016  . Sleep disturbance 10/21/2019  . Splenomegaly a  . Stage 3 chronic kidney disease ([facility]) 10/21/2019  . Thrombocytopenia ([facility]) 09/03/2017  . Tremor of both hands 05/15/2016  . Vitamin D deficiency 11/05/2017    Family History  Problem Relation Age of Onset  . Cancer Mother        ovarian  . Hyperlipidemia Mother   . Alzheimer's disease Mother   . Arthritis Mother        rheumatoid  . Cancer Father        lung  . Parkinson's disease Father   . Alzheimer's disease Father   . ALS Sister   . Cancer Maternal Aunt        pancreatic, lung, liver,breast  . Breast cancer Maternal Aunt   . Cancer Maternal Uncle        pancreatic, liver, brain  . Colon cancer Maternal Uncle   . Heart attack Paternal Aunt   . Stroke Paternal Aunt   . Stomach cancer Paternal Aunt   . Heart attack Paternal Uncle   . Stroke Paternal Grandmother   . Heart attack Paternal Grandfather   . Arthritis Sister        rheumatoid  . Huntington's disease Daughter        presumed inherited from father per patient  . Colon cancer Maternal Uncle   . Pancreatic cancer Maternal Uncle   . Liver disease Maternal Uncle   . Breast cancer Maternal Aunt   . Breast cancer Maternal Aunt   . Esophageal cancer Neg Hx   . Rectal cancer Neg Hx     Past Surgical History:  Procedure Laterality Date  . ABDOMINAL HYSTERECTOMY    . APPENDECTOMY    . BACK SURGERY     thoracic-fractured   . BACK SURGERY     lumbar - fractured  . BREAST LUMPECTOMY WITH RADIOACTIVE SEED LOCALIZATION Right 08/01/2018   Procedure: RADIOACTIVE SEED GUIDED RIGHT BREAST LUMPECTOMY;  Surgeon: Abigail Miyamoto, MD;  Location: [facility] OR;  Service: General;  Laterality: Right;  . ESOPHAGOGASTRODUODENOSCOPY  05/20/2008   Mild to moderate  esophagitis. Otherwise normal EGD.   Marland Kitchen EYE SURGERY Left    cataract surgery with lens implant  . GASTROSTOMY     peptic ulcer - peg tube placement   Social History   Occupational History  . Not on file  Tobacco Use  . Smoking status: Former Smoker    Packs/day: 1.00    Years: 22.00    Pack years: 22.00    Types: Cigarettes    Quit date: 06/26/1986    Years since quitting: 34.0  . Smokeless tobacco: Never Used  Vaping Use  . Vaping Use: Never used  Substance and Sexual Activity  . Alcohol use: No  . Drug use: No  . Sexual activity: Not Currently    Birth control/protection: None

## 2020-06-28 NOTE — Patient Instructions (Addendum)
Plan: Knee is suffering from osteoarthritis, only real proven treatments are Weight loss and exercise. Well padded shoes help. Ice the knee that is suffering from osteoarthritis, only real proven treatments are Weight loss,you should not take arthritis meds due to coronary artery disease and GERD,exercise is helpful. Well padded shoes help. Ice the knee 2-3 times a day 15-20 mins at a time.-3 times a day 15-20 mins at a time. Hot showers in the AM.  Injection with steroid may be of benefit. Hemp CBD capsules, amazon.com 5,000-7,000 mg per bottle, 60 capsules per bottle, take one capsule twice a day. Cane in the left hand to use with left leg weight bearing. Follow-Up Instructions: No follow-ups on file.  Avoid overhead lifting and overhead use of the arms. Do not lift greater than 5 lbs. Adjust head rest in vehicle to prevent hyperextension if rear ended. Take extra precautions to avoid falling. Avoid bending, stooping and avoid lifting weights greater than 10 lbs. Avoid prolong standing and walking. Avoid frequent bending and stooping  No lifting greater than 10 lbs. May use ice or moist heat for pain. Weight loss is of benefit. Handicap license is approved. Dr. [place] Blas secretary/Assistant will call to arrange for EMG and NCV left arm and left leg Discontinued Gabapentin due to leg swelling and need for diuresis Lyrica started, begin with one at night for 3 days then one tablet twice a day for 3 days then one capsule three times per day. Referral to Dr. Magnus Ivan for consideration of left TKR followed by right TKR.

## 2020-07-02 ENCOUNTER — Other Ambulatory Visit: Payer: Self-pay | Admitting: Specialist

## 2020-07-02 NOTE — Telephone Encounter (Signed)
Pls advise.  

## 2020-07-05 ENCOUNTER — Ambulatory Visit: Payer: Medicare Other | Admitting: Cardiology

## 2020-07-05 ENCOUNTER — Encounter: Payer: Self-pay | Admitting: Cardiology

## 2020-07-05 ENCOUNTER — Other Ambulatory Visit: Payer: Self-pay

## 2020-07-05 VITALS — BP 144/82 | HR 94 | Ht 60.0 in | Wt 148.0 lb

## 2020-07-05 DIAGNOSIS — K766 Portal hypertension: Secondary | ICD-10-CM

## 2020-07-05 DIAGNOSIS — I10 Essential (primary) hypertension: Secondary | ICD-10-CM

## 2020-07-05 DIAGNOSIS — I2510 Atherosclerotic heart disease of native coronary artery without angina pectoris: Secondary | ICD-10-CM | POA: Diagnosis not present

## 2020-07-05 DIAGNOSIS — R0602 Shortness of breath: Secondary | ICD-10-CM | POA: Diagnosis not present

## 2020-07-05 DIAGNOSIS — R072 Precordial pain: Secondary | ICD-10-CM | POA: Diagnosis not present

## 2020-07-05 DIAGNOSIS — R079 Chest pain, unspecified: Secondary | ICD-10-CM

## 2020-07-05 NOTE — Patient Instructions (Signed)
Medication Instructions:  Your physician recommends that you continue on your current medications as directed. Please refer to the Current Medication list given to you today.  *If you need a refill on your cardiac medications before your next appointment, please call your pharmacy*   Lab Work: Your physician recommends that you return for lab work today: bmp , pro bnp  If you have labs (blood work) drawn today and your tests are completely normal, you will receive your results only by: . MyChart Message (if you have MyChart) OR . A paper copy in the mail If you have any lab test that is abnormal or we need to change your treatment, we will call you to review the results.   Testing/Procedures: None   Follow-Up: At [facility], you and your health needs are our priority.  As part of our continuing mission to provide you with exceptional heart care, we have created designated Provider Care Teams.  These Care Teams include your primary Cardiologist (physician) and Advanced Practice Providers (APPs -  Physician Assistants and Nurse Practitioners) who all work together to provide you with the care you need, when you need it.  We recommend signing up for the patient portal called "MyChart".  Sign up information is provided on this After Visit Summary.  MyChart is used to connect with patients for Virtual Visits (Telemedicine).  Patients are able to view lab/test results, encounter notes, upcoming appointments, etc.  Non-urgent messages can be sent to your provider as well.   To learn more about what you can do with MyChart, go to [email or web address].    Your next appointment:   3 month(s)  The format for your next appointment:   In Person  Provider:   Robert Krasowski, MD   Other Instructions   

## 2020-07-05 NOTE — Progress Notes (Signed)
Cardiology Office Note:    Date:  07/05/2020   ID:  Amy Moon, DOB 09/23/1950, MRN [id number]  PCP:  Mayer MaskerAbonza, Maritza, PA-C  Cardiologist:  Amy Balsamobert Joseline Mccampbell, MD    Referring MD: Mayer MaskerAbonza, Maritza, PA-C   Chief Complaint  Patient presents with  . Chest Pain  . Shortness of Breath  . Fatigue    History of Present Illness:    Amy Moon is a 70 y.o. female with past medical history significant for chronic smoking, asbestos exposure, initially she was referred to us because of shortness of breath as well as some atypical chest pain.  Eventually she ended up having coronary CT angio which showed some significant calcium deposit however in terms of obstruction there was up to 65% stenosis in the LAD.  She also had some noncritical lesion in the right coronary artery.  Conclusion was that she does have coronary artery disease but nonobstructive.  She still complaining having some shortness of breath swelling of lower extremities as well as abdominal distention.  She was given only 20 mg of furosemide with dramatic improvement.  I also put her on ranolazine however she cannot tell me if it makes her feel any better.  She attributes her better feeling to diuretic use.  Still complain having weakness fatigue tiredness as well as swelling of lower extremities but not much less than before.  Past Medical History:  Diagnosis Date  . Allergy   . Anemia   . Angina pectoris (HCC)   . Arthritis   . Asthma    years ago  . Bronchitis 06/03/2018  . Cancer (HCC)    squamous cell carcinoma, nose  . Cataract   . Chest pain 07/30/2017  . Chronic kidney disease    possibly per pt  . Chronic pain syndrome 07/30/2017  . Cirrhosis (HCC)   . Clotting disorder (HCC)    platelets are low  . Coronary artery disease 04/02/2020  . Depression   . Drug-induced low platelet count   . Duodenal ulcer   . Dyslipidemia 04/02/2020  . Dysphagia 09/03/2017  . Dyspnea on exertion 10/17/2019  . Encounter for hepatitis  C virus screening test for high risk patient 11/05/2017  . Encounter for Medicare annual wellness exam 05/09/2018  . Enlarged thyroid   . Family history of adverse reaction to anesthesia    father had difficulty waking up and breathing on his own after surgery  . GERD (gastroesophageal reflux disease)   . Granulomatous disease (HCC)   . Healthcare maintenance 07/30/2017  . Hepatic disease   . Hiatal hernia   . History of exposure to asbestos 10/21/2019  . History of kidney stones   . History of smoking 25-50 pack years- quit age 70, smoked 2ppd from 70 yo-70 yo. 10/21/2019  . History of surgery- pt has several Sx- no personal issues wiht anesthesia  10/21/2019  . Hypertension   . Irritant contact dermatitis 08/25/2015  . Low vitamin D level   . Nausea 03/01/2016  . Neuropathy   . Osteopenia   . Osteoporosis   . Parkinson's disease (HCC)   . Pneumonia   . Portal hypertension (HCC)   . Severe episode of recurrent major depressive disorder, without psychotic features (HCC) 05/15/2016  . Severe obesity (BMI >= 40) (HCC) 05/15/2016  . Sleep disturbance 10/21/2019  . Splenomegaly a  . Stage 3 chronic kidney disease (HCC) 10/21/2019  . Thrombocytopenia (HCC) 09/03/2017  . Tremor of both hands 05/15/2016  . Vitamin D deficiency  11/05/2017    Past Surgical History:  Procedure Laterality Date  . ABDOMINAL HYSTERECTOMY    . APPENDECTOMY    . BACK SURGERY     thoracic-fractured   . BACK SURGERY     lumbar - fractured  . BREAST LUMPECTOMY WITH RADIOACTIVE SEED LOCALIZATION Right 08/01/2018   Procedure: RADIOACTIVE SEED GUIDED RIGHT BREAST LUMPECTOMY;  Surgeon: Coralie Keens, MD;  Location: [place];  Service: General;  Laterality: Right;  . ESOPHAGOGASTRODUODENOSCOPY  05/20/2008   Mild to moderate esophagitis. Otherwise normal EGD.   Marland Kitchen EYE SURGERY Left    cataract surgery with lens implant  . GASTROSTOMY     peptic ulcer - peg tube placement    Current Medications: Current Meds  Medication  Sig  . acetaminophen (TYLENOL) 650 MG CR tablet Take 650 mg by mouth every 6 (six) hours as needed for pain.  Marland Kitchen atorvastatin (LIPITOR) 10 MG tablet Take 1 tablet (10 mg total) by mouth daily.  . bisacodyl (DULCOLAX) 5 MG EC tablet Take 15 mg by mouth at bedtime as needed for moderate constipation.  . clopidogrel (PLAVIX) 75 MG tablet TAKE 1 TABLET BY MOUTH DAILY.  . cyclobenzaprine (FLEXERIL) 10 MG tablet TAKE 1 TABLET BY MOUTH EVERY 8 HOURS AS NEEDED  . diphenhydrAMINE (BENADRYL) 25 MG tablet Take 25 mg by mouth every 6 (six) hours as needed.  . eszopiclone (LUNESTA) 1 MG TABS tablet Take 1 tablet (1 mg total) by mouth at bedtime as needed for sleep. Take immediately before bedtime  . furosemide (LASIX) 20 MG tablet Take 1 tablet (20 mg total) by mouth daily.  Marland Kitchen HYDROcodone-acetaminophen (NORCO/VICODIN) 5-325 MG tablet TAKE 1 TABLET BY MOUTH EVERY 4 HOURS AS NEEDED FOR UP TO 5 DAYS FOR MODERATE PAIN.  Marland Kitchen losartan (COZAAR) 50 MG tablet Take 1 tablet (50 mg total) by mouth daily.  . metoprolol tartrate (LOPRESSOR) 50 MG tablet Take 1 tablet (50 mg total) by mouth once for 1 dose. Take 1 tablet (50mg ) 2 hr prior to CT appt  . nitroGLYCERIN (NITROSTAT) 0.4 MG SL tablet Place 0.4 mg under the tongue every 5 (five) minutes as needed for chest pain.   . pantoprazole (PROTONIX) 40 MG tablet Take 1 tablet (40 mg total) by mouth daily.  . potassium chloride (KLOR-CON) 10 MEQ tablet Take 1 tablet (10 mEq total) by mouth daily.  . pregabalin (LYRICA) 50 MG capsule Take 1 capsule (50 mg total) by mouth 3 (three) times daily.  . promethazine (PHENERGAN) 25 MG tablet TAKE 1 TABLET BY MOUTH 4 TIMES DAILY AS NEEDED FOR NAUSEA OR VOMITING.  . ranolazine (RANEXA) 1000 MG SR tablet Take 1 tablet (1,000 mg total) by mouth 2 (two) times daily.  . sertraline (ZOLOFT) 100 MG tablet Take 1 tablet (100 mg total) by mouth daily.   Current Facility-Administered Medications for the 07/05/20 encounter (Office Visit) with  Park Liter, MD  Medication  . bupivacaine (MARCAINE) 0.25 % (with pres) injection 2 mL     Allergies:   Iodinated diagnostic agents, Nsaids, Latex, Sinemet [carbidopa w-levodopa], Inderal [propranolol], Indomethacin, Penicillin g, Pneumococcal vaccines, Scopolamine, and Tape   Social History   Socioeconomic History  . Marital status: Married    Spouse name: Not on file  . Number of children: 3  . Years of education: Not on file  . Highest education level: Not on file  Occupational History  . Not on file  Tobacco Use  . Smoking status: Former Smoker    Packs/day: 1.00  Years: 22.00    Pack years: 22.00    Types: Cigarettes    Quit date: 06/26/1986    Years since quitting: 34.0  . Smokeless tobacco: Never Used  Vaping Use  . Vaping Use: Never used  Substance and Sexual Activity  . Alcohol use: No  . Drug use: No  . Sexual activity: Not Currently    Birth control/protection: None  Other Topics Concern  . Not on file  Social History Narrative  . Not on file   Social Determinants of Health   Financial Resource Strain: Not on file  Food Insecurity: Not on file  Transportation Needs: Not on file  Physical Activity: Not on file  Stress: Not on file  Social Connections: Not on file     Family History: The patient's family history includes ALS in her sister; Alzheimer's disease in her father and mother; Arthritis in her mother and sister; Breast cancer in her maternal aunt, maternal aunt, and maternal aunt; Cancer in her father, maternal aunt, maternal uncle, and mother; Colon cancer in her maternal uncle and maternal uncle; Heart attack in her paternal aunt, paternal grandfather, and paternal uncle; Huntington's disease in her daughter; Hyperlipidemia in her mother; Liver disease in her maternal uncle; Pancreatic cancer in her maternal uncle; Parkinson's disease in her father; Stomach cancer in her paternal aunt; Stroke in her paternal aunt and paternal grandmother.  There is no history of Esophageal cancer or Rectal cancer. ROS:   Please see the history of present illness.    All 14 point review of systems negative except as described per history of present illness  EKGs/Labs/Other Studies Reviewed:      Recent Labs: 09/17/2019: Hemoglobin 13.0; Platelets 137.0 04/02/2020: ALT 11; BUN 7; Creatinine, Ser 0.76; NT-Pro BNP 67; Potassium 3.2; Sodium 138  Recent Lipid Panel    Component Value Date/Time   CHOL 170 04/02/2020 0911   TRIG 71 04/02/2020 0911   HDL 57 04/02/2020 0911   CHOLHDL 3.0 04/02/2020 0911   LDLCALC 99 04/02/2020 0911    Physical Exam:    VS:  BP (!) 144/82 (BP Location: Left Arm, Patient Position: Sitting)   Pulse 94   Ht 5' (1.524 m)   Wt 148 lb (67.1 kg)   SpO2 98%   BMI 28.90 kg/m     Wt Readings from Last 3 Encounters:  07/05/20 148 lb (67.1 kg)  06/28/20 143 lb (64.9 kg)  05/18/20 143 lb (64.9 kg)     GEN:  Well nourished, well developed in no acute distress HEENT: Normal NECK: No JVD; No carotid bruits LYMPHATICS: No lymphadenopathy CARDIAC: RRR, no murmurs, no rubs, no gallops RESPIRATORY:  Clear to auscultation without rales, wheezing or rhonchi  ABDOMEN: Soft, non-tender, non-distended MUSCULOSKELETAL:  No edema; No deformity  SKIN: Warm and dry LOWER EXTREMITIES: no swelling NEUROLOGIC:  Alert and oriented x 3 PSYCHIATRIC:  Normal affect   ASSESSMENT:    1. Chest pain of uncertain etiology   2. Coronary artery disease involving native coronary artery of native heart without angina pectoris   3. Portal hypertension ([place])   4. Primary hypertension   5. Precordial pain    PLAN:    In order of problems listed above:  1. Chest pain uncertain etiology.  This coronary CT under sharp to 65% lesion nonobstructive.  She was then given her chest pain.  I put her empirically on ranolazine thinking about possibility of small vessel disease.  She is still taking it but she cannot  tell me for sure if it  helps or not. 2. Coronary artery disease again nonobstructive plan as described above.  The case risk factors modifications. 3. Portal hypertension that being followed by GI as well as internal medicine team. 4. Dyspnea on exertion undoubtedly multifactorial.  I will ask her to have Chem-7 done today to check kidney function as well as potassium on top of that I will check proBNP to see if there is any role to increase diuresis in terms of heart management.  I did review her K PN which showed me her LDL of 99 HDL 57.  This is from April 02, 2020 she is on Lipitor 10 which we will continue.  We will make arrangements for fasting lipid profile to be rechecked   Medication Adjustments/Labs and Tests Ordered: Current medicines are reviewed at length with the patient today.  Concerns regarding medicines are outlined above.  Orders Placed This Encounter  Procedures  . EKG 12-Lead   Medication changes: No orders of the defined types were placed in this encounter.   Signed, Park Liter, MD, [facility] 07/05/2020 9:32 AM    [place]

## 2020-07-05 NOTE — Addendum Note (Signed)
Addended by: Senaida Ores on: 07/05/2020 09:39 AM   Modules accepted: Orders

## 2020-07-06 ENCOUNTER — Other Ambulatory Visit: Payer: Self-pay | Admitting: Gastroenterology

## 2020-07-06 DIAGNOSIS — K5909 Other constipation: Secondary | ICD-10-CM

## 2020-07-06 DIAGNOSIS — R110 Nausea: Secondary | ICD-10-CM

## 2020-07-06 LAB — BASIC METABOLIC PANEL
BUN/Creatinine Ratio: 17 (ref 12–28)
BUN: 13 mg/dL (ref 8–27)
CO2: 23 mmol/L (ref 20–29)
Calcium: 9.4 mg/dL (ref 8.7–10.3)
Chloride: 99 mmol/L (ref 96–106)
Creatinine, Ser: 0.75 mg/dL (ref 0.57–1.00)
GFR calc Af Amer: 92 mL/min/{1.73_m2} (ref >59)
GFR calc non Af Amer: 80 mL/min/{1.73_m2} (ref >59)
Glucose: 88 mg/dL (ref 65–99)
Potassium: 3.7 mmol/L (ref 3.5–5.2)
Sodium: 139 mmol/L (ref 134–144)

## 2020-07-06 LAB — PRO B NATRIURETIC PEPTIDE: NT-Pro BNP: 58 pg/mL (ref 0–301)

## 2020-07-07 ENCOUNTER — Institutional Professional Consult (permissible substitution): Payer: Medicare Other | Admitting: Emergency Medicine

## 2020-07-08 ENCOUNTER — Ambulatory Visit (INDEPENDENT_AMBULATORY_CARE_PROVIDER_SITE_OTHER): Payer: Medicare Other | Admitting: Physician Assistant

## 2020-07-08 ENCOUNTER — Encounter: Payer: Self-pay | Admitting: Physician Assistant

## 2020-07-08 ENCOUNTER — Ambulatory Visit: Payer: Medicare Other | Admitting: Physician Assistant

## 2020-07-08 ENCOUNTER — Telehealth: Payer: Self-pay | Admitting: Emergency Medicine

## 2020-07-08 VITALS — BP 165/82 | Ht 60.0 in | Wt 144.6 lb

## 2020-07-08 DIAGNOSIS — R220 Localized swelling, mass and lump, head: Secondary | ICD-10-CM

## 2020-07-08 DIAGNOSIS — F419 Anxiety disorder, unspecified: Secondary | ICD-10-CM

## 2020-07-08 DIAGNOSIS — F32A Depression, unspecified: Secondary | ICD-10-CM

## 2020-07-08 DIAGNOSIS — G4700 Insomnia, unspecified: Secondary | ICD-10-CM | POA: Diagnosis not present

## 2020-07-08 DIAGNOSIS — N6310 Unspecified lump in the right breast, unspecified quadrant: Secondary | ICD-10-CM | POA: Diagnosis not present

## 2020-07-08 DIAGNOSIS — Z79899 Other long term (current) drug therapy: Secondary | ICD-10-CM

## 2020-07-08 MED ORDER — DOXYCYCLINE HYCLATE 100 MG PO TABS: 100.0000 mg | ORAL_TABLET | Freq: Two times a day (BID) | ORAL | 0 refills | Status: DC

## 2020-07-08 MED ORDER — SERTRALINE HCL 100 MG PO TABS: ORAL_TABLET | ORAL | 1 refills | Status: DC

## 2020-07-08 MED ORDER — FUROSEMIDE 40 MG PO TABS: 40.0000 mg | ORAL_TABLET | Freq: Every day | ORAL | 1 refills | Status: DC

## 2020-07-08 MED ORDER — ESZOPICLONE 1 MG PO TABS
1.0000 mg | ORAL_TABLET | Freq: Every evening | ORAL | 1 refills | Status: DC | PRN
Start: 2020-07-21 — End: 2021-01-12

## 2020-07-08 NOTE — Telephone Encounter (Signed)
Called and spoke to patient. Informed her of results. She will increase lasix to 40 mg daily and she already takes potassium 10 meq daily. She will have labs drawn next week. No further questions.

## 2020-07-08 NOTE — Telephone Encounter (Signed)
-----   Message from Park Liter, MD sent at 07/07/2020  8:27 PM EST ----- Laboratory test did not show evidence of congestive heart failure, however I think it would be reasonable to try increased dose of Lasix from 20 mg daily to 40 mg daily also please add 10 mEq of potassium and see how she responds to that.  She need to have Chem-7 done within the next week or so

## 2020-07-08 NOTE — Progress Notes (Signed)
Telehealth office visit note for Amy Reid, PA-C- at Primary Care at [facility]   I connected with current patient today by telephone and verified that I am speaking with the correct person   . Location of the patient: Home . Location of the provider: Office - This visit type was conducted due to national recommendations for restrictions regarding the COVID-19 Pandemic (e.g. social distancing) in an effort to limit this patient's exposure and mitigate transmission in our community.    - No physical exam could be performed with this format, beyond that communicated to [place] by the patient/ family members as noted.   - Additionally my office staff/ schedulers were to discuss with the patient that there may be a monetary charge related to this service, depending on their medical insurance.  My understanding is that patient understood and consented to proceed.     _________________________________________________________________________________   History of Present Illness: Patient calls in follow up on insomnia and mood.  Has complaints of left facial pain and swelling.  Reports a low-grade fever for 4 days (unable to recall exact temperatures) and chills.  Denies redness, cough, vision changes, altered mental status, or new onset of chest pain. States last week her jaw popped and since then has been having pain with progression of swelling. Feels like her gums are swelling as well (has dentures).  Also reports found a new mass on her right breast.  Insomnia: Reports it is getting a little bit more of rest with Lunesta.  Takes medication almost every night.  Tolerating without issues.  Mood: Tolerating Zoloft 100 mg.  Reports medication compliance. Patient feels symptoms are a little bit better but continues to feel overwhelmed, stressed, and anxious. Patient is a poor historian and unable to clearly describe mood symptoms. States it is hard to explain. Feels like will benefit from  increasing Zoloft.     GAD 7 : Generalized Anxiety Score 12/22/2019  Nervous, Anxious, on Edge 3  Control/stop worrying 1  Worry too much - different things 3  Trouble relaxing 3  Restless 1  Easily annoyed or irritable 0  Afraid - awful might happen 1  Total GAD 7 Score 12  Anxiety Difficulty Somewhat difficult    Depression screen [facility] 2/9 07/08/2020 05/18/2020 03/23/2020 12/22/2019 06/17/2019  Decreased Interest 0 2 1 1 1   Down, Depressed, Hopeless 0 3 - 1 1  PHQ - 2 Score 0 5 1 2 2   Altered sleeping 0 3 3 3 3   Tired, decreased energy 0 3 3 1 1   Change in appetite 0 0 1 0 1  Feeling bad or failure about yourself  0 1 0 1 1  Trouble concentrating 0 1 1 1 1   Moving slowly or fidgety/restless 0 0 0 1 3  Suicidal thoughts 0 0 0 0 0  PHQ-9 Score 0 13 9 9 12   Difficult doing work/chores - Very difficult Somewhat difficult Somewhat difficult Somewhat difficult  Some recent data might be hidden      Impression and Recommendations:     1. Anxiety and depression   2. Breast mass, right   3. Left facial swelling   4. Insomnia, unspecified type     Anxiety and depression: -PHQ-9 score of 0, denies SI/HI. -Difficult to assess symptom improvement and patient continues to endorse anxiety so we will increase sertraline (Zoloft) to 150 mg.  Advised to let me know if unable to tolerate increased medication dose. -Discussed referral to  psychology, patient declined. -Follow-up in 8 weeks to reassess symptoms and medication therapy.  Insomnia, unspecified type: -Mild improvement.  Will continue Lunesta 1 mg.  PDMP reviewed, medication last filled June 21, 2020. Provided future refill. -If insomnia fails to improve then will consider adjusting medication dose. -Recommend to establish a good sleep hygiene and incorporate relaxation techniques before bedtime.  Left facial swelling: -Symptoms are concerning for possible infection so we will start doxycycline.  Advised to monitor for  worsening symptoms and recommended to seek in person evaluation if symptoms worsen.  Patient verbalized understanding.  Right breast mass: -Patient underwent right breast biopsy 06/2018 and pathology revealed fibrocystic changes. -Will place order for diagnostic mammogram for further evaluation of new mass.  Hypertension: -Ambulatory BP reading today is elevated. Patient is followed by Cardiology and had a recent consult (07/05/2020) and BP 144/82. Recommend to continue monitoring BP and if consistently >140/90 then will consider increasing Losartan to 100 mg. Also recommend bringing in BP device next visit. Patient reports dyspnea and chest pain remain at baseline, denies changes.    - As part of my medical decision making, I reviewed the following data within the [place] History obtained from pt /family, CMA notes reviewed and incorporated if applicable, Labs reviewed, Radiograph/ tests reviewed if applicable and OV notes from prior OV's with me, as well as any other specialists she/he has seen since seeing me last, were all reviewed and used in my medical decision making process today.    - Additionally, when appropriate, discussion had with patient regarding our treatment plan, and their biases/concerns about that plan were used in my medical decision making today.    - The patient agreed with the plan and demonstrated an understanding of the instructions.   No barriers to understanding were identified.     - The patient was advised to call back or seek an in-person evaluation if the symptoms worsen or if the condition fails to improve as anticipated.   Return in about 8 weeks (around 09/02/2020) for Mood- inc med, insomnia, HTN .    Orders Placed This Encounter  Procedures  . MM Digital Diagnostic Bilat    Meds ordered this encounter  Medications  . doxycycline (VIBRA-TABS) 100 MG tablet    Sig: Take 1 tablet (100 mg total) by mouth 2 (two) times daily.     Dispense:  14 tablet    Refill:  0    Order Specific Question:   Supervising Provider    Answer:   Beatrice Lecher D [[id number]]  . eszopiclone (LUNESTA) 1 MG TABS tablet    Sig: Take 1 tablet (1 mg total) by mouth at bedtime as needed for sleep. Take immediately before bedtime    Dispense:  30 tablet    Refill:  1    Order Specific Question:   Supervising Provider    Answer:   Beatrice Lecher D [[id number]]  . sertraline (ZOLOFT) 100 MG tablet    Sig: Take 1.5 tablet (150 mg) by mouth daily.    Dispense:  45 tablet    Refill:  1    Order Specific Question:   Supervising Provider    Answer:   Beatrice Lecher D [[id number]]    Medications Discontinued During This Encounter  Medication Reason  . eszopiclone (LUNESTA) 1 MG TABS tablet Reorder  . sertraline (ZOLOFT) 100 MG tablet        Time spent on visit including pre-visit chart review and post-visit care was 23  minutes.      The [place] was signed into law in 2016 which includes the topic of electronic health records.  This provides immediate access to information in MyChart.  This includes consultation notes, operative notes, office notes, lab results and pathology reports.  If you have any questions about what you read please let us know at your next visit or call us at the office.  We are right here with you.  Note:  This note was prepared with assistance of Dragon voice recognition software. Occasional wrong-word or sound-a-like substitutions may have occurred due to the inherent limitations of voice recognition software.  __________________________________________________________________________________     Patient Care Team    Relationship Specialty Notifications Start End  Amy Moon, [place] PCP - General Physician Assistant  12/22/19   Park Liter, MD PCP - Cardiology Cardiology Admissions 07/01/18   Jackquline Denmark, MD Consulting Physician Gastroenterology  05/09/18      -Vitals obtained;  medications/ allergies reconciled;  personal medical, social, Sx etc.histories were updated by CMA, reviewed by me and are reflected in chart   Patient Active Problem List   Diagnosis Date Noted  . Coronary artery disease 04/02/2020  . Splenomegaly   . Portal hypertension ([place])   . Pneumonia   . Osteoporosis   . Osteopenia   . Neuropathy   . Low vitamin D level   . History of kidney stones   . Hiatal hernia   . Hepatic disease   . Granulomatous disease ([place])   . GERD (gastroesophageal reflux disease)   . Enlarged thyroid   . Duodenal ulcer   . Drug-induced low platelet count   . Depression   . Clotting disorder ([place])   . Cirrhosis ([place])   . Chronic kidney disease   . Cataract   . Cancer ([place])   . Asthma   . Arthritis   . Angina pectoris ([place])   . Anemia   . Allergy   . Stage 3 chronic kidney disease ([place]) 10/21/2019  . History of smoking 25-50 pack years- quit age 22, smoked 2ppd from 70 yo-70 yo. 10/21/2019  . History of surgery- pt has several Sx- no personal issues wiht anesthesia  10/21/2019  . Sleep disturbance 10/21/2019  . History of exposure to asbestos 10/21/2019  . Family history of adverse reaction to anesthesia   . Dyspnea on exertion 10/17/2019  . Bronchitis 06/03/2018  . Encounter for Medicare annual wellness exam 05/09/2018  . Vitamin D deficiency 11/05/2017  . Encounter for hepatitis C virus screening test for high risk patient 11/05/2017  . Dysphagia 09/03/2017  . Thrombocytopenia ([place]) 09/03/2017  . Healthcare maintenance 07/30/2017  . Chronic pain syndrome 07/30/2017  . Chest pain 07/30/2017  . Parkinson's disease ([place]) 07/30/2017  . Hypertension 07/30/2017  . Severe obesity (BMI >= 40) ([place]) 05/15/2016  . Severe episode of recurrent major depressive disorder, without psychotic features ([place]) 05/15/2016  . Tremor of both hands 05/15/2016  . Nausea 03/01/2016  . Irritant contact dermatitis 08/25/2015     Current Meds  Medication Sig  .  acetaminophen (TYLENOL) 650 MG CR tablet Take 650 mg by mouth every 6 (six) hours as needed for pain.  Marland Kitchen atorvastatin (LIPITOR) 10 MG tablet Take 1 tablet (10 mg total) by mouth daily.  . bisacodyl (DULCOLAX) 5 MG EC tablet Take 15 mg by mouth at bedtime as needed for moderate constipation.  . clopidogrel (PLAVIX) 75 MG tablet TAKE 1 TABLET BY MOUTH DAILY.  Marland Kitchen  cyclobenzaprine (FLEXERIL) 10 MG tablet TAKE 1 TABLET BY MOUTH EVERY 8 HOURS AS NEEDED  . diphenhydrAMINE (BENADRYL) 25 MG tablet Take 25 mg by mouth every 6 (six) hours as needed.  . doxycycline (VIBRA-TABS) 100 MG tablet Take 1 tablet (100 mg total) by mouth 2 (two) times daily.  . furosemide (LASIX) 40 MG tablet Take 1 tablet (40 mg total) by mouth daily.  Marland Kitchen HYDROcodone-acetaminophen (NORCO/VICODIN) 5-325 MG tablet Take 1 tablet by mouth every 6 (six) hours as needed for up to 7 days for moderate pain. TAKE 1 TABLET BY MOUTH EVERY 4 HOURS AS NEEDED FOR UP TO 5 DAYS FOR MODERATE PAIN.  Marland Kitchen losartan (COZAAR) 50 MG tablet Take 1 tablet (50 mg total) by mouth daily.  . metoprolol tartrate (LOPRESSOR) 50 MG tablet Take 1 tablet (50 mg total) by mouth once for 1 dose. Take 1 tablet (50mg ) 2 hr prior to CT appt  . nitroGLYCERIN (NITROSTAT) 0.4 MG SL tablet Place 0.4 mg under the tongue every 5 (five) minutes as needed for chest pain.   . pantoprazole (PROTONIX) 40 MG tablet Take 1 tablet (40 mg total) by mouth daily.  . potassium chloride (KLOR-CON) 10 MEQ tablet Take 1 tablet (10 mEq total) by mouth daily.  . pregabalin (LYRICA) 50 MG capsule Take 1 capsule (50 mg total) by mouth 3 (three) times daily.  . promethazine (PHENERGAN) 25 MG tablet TAKE 1 TABLET BY MOUTH 4 TIMES DAILY AS NEEDED FOR NAUSEA OR VOMITING.  . ranolazine (RANEXA) 1000 MG SR tablet Take 1 tablet (1,000 mg total) by mouth 2 (two) times daily.  . [DISCONTINUED] eszopiclone (LUNESTA) 1 MG TABS tablet Take 1 tablet (1 mg total) by mouth at bedtime as needed for sleep. Take  immediately before bedtime  . [DISCONTINUED] sertraline (ZOLOFT) 100 MG tablet Take 1 tablet (100 mg total) by mouth daily.   Current Facility-Administered Medications for the 07/08/20 encounter (Office Visit) with Amy Reid, PA-C  Medication  . bupivacaine (MARCAINE) 0.25 % (with pres) injection 2 mL     Allergies:  Allergies  Allergen Reactions  . Iodinated Diagnostic Agents Hives  . Nsaids Other (See Comments)    History of bleeding ulcers  . Latex Hives and Rash  . Sinemet [Carbidopa W-Levodopa] Nausea And Vomiting  . Inderal [Propranolol] Other (See Comments)    Unknown  . Indomethacin Other (See Comments)    Unknown  . Penicillin G Other (See Comments)    Unknown  . Pneumococcal Vaccines Other (See Comments)    Caused "pneumonia"  . Scopolamine Other (See Comments)    The patch, caused her to pass out / change in mental status  . Tape Other (See Comments)    Unknown     ROS:  See above HPI for pertinent positives and negatives   Objective:   Blood pressure (!) 165/82, height 5' (1.524 m), weight 144 lb 9.6 oz (65.6 kg).  (if some vitals are omitted, this means that patient was UNABLE to obtain them.) General: A & O * 3; sounds in no acute distress  Respiratory: speaking in full sentences, no conversational dyspnea Psych: insight appears good, mood- appears full

## 2020-07-16 ENCOUNTER — Other Ambulatory Visit: Payer: Self-pay | Admitting: Specialist

## 2020-07-19 ENCOUNTER — Telehealth: Payer: Self-pay | Admitting: Physician Assistant

## 2020-07-19 NOTE — Telephone Encounter (Signed)
Spoke with patient who states that her jaw and mouth are popping when she tries to chew and that her mouth is in pain. Per Herb Grays, patient advised to call Dentist.   In regards to [name]- spoke with [name] from [facility] who states patient has not had rx filled since 12/21. Lunesta still on hold at pharmacy.   Spoke with patient who says she got confused when looking at meds in [place]. AS, CMA

## 2020-07-19 NOTE — Telephone Encounter (Signed)
Patient noticed on her Amy Moon she was not supposed to start taking it until Jan 26th and she started taking it when she picked it up. I believe it was picked up around the 13th. She would like to know why she was not supposed to take it until the 26th as she has been taking it, thanks.

## 2020-07-19 NOTE — Telephone Encounter (Signed)
Insomnia, unspecified type: -Mild improvement.  Will continue Lunesta 1 mg.  PDMP reviewed, medication last filled June 21, 2020. Provided future refill. -If insomnia fails to improve then will consider adjusting medication dose. -Recommend to establish a good sleep hygiene and incorporate relaxation techniques before bedtime

## 2020-07-20 ENCOUNTER — Other Ambulatory Visit: Payer: Self-pay | Admitting: Physician Assistant

## 2020-07-20 DIAGNOSIS — N6310 Unspecified lump in the right breast, unspecified quadrant: Secondary | ICD-10-CM

## 2020-07-21 ENCOUNTER — Ambulatory Visit: Payer: Medicare Other | Admitting: Physician Assistant

## 2020-07-23 ENCOUNTER — Institutional Professional Consult (permissible substitution): Payer: Medicare Other | Admitting: Pulmonary Disease

## 2020-07-28 ENCOUNTER — Other Ambulatory Visit: Payer: Self-pay | Admitting: Specialist

## 2020-07-29 ENCOUNTER — Encounter: Payer: Self-pay | Admitting: Specialist

## 2020-07-29 ENCOUNTER — Other Ambulatory Visit: Payer: Self-pay

## 2020-07-29 ENCOUNTER — Ambulatory Visit: Payer: Medicare Other | Admitting: Specialist

## 2020-07-29 VITALS — BP 137/74 | HR 105 | Ht 60.0 in | Wt 145.0 lb

## 2020-07-29 DIAGNOSIS — M25511 Pain in right shoulder: Secondary | ICD-10-CM | POA: Diagnosis not present

## 2020-07-29 DIAGNOSIS — M5412 Radiculopathy, cervical region: Secondary | ICD-10-CM | POA: Diagnosis not present

## 2020-07-29 DIAGNOSIS — G8929 Other chronic pain: Secondary | ICD-10-CM

## 2020-07-29 DIAGNOSIS — M25512 Pain in left shoulder: Secondary | ICD-10-CM

## 2020-07-29 DIAGNOSIS — M4802 Spinal stenosis, cervical region: Secondary | ICD-10-CM

## 2020-07-29 DIAGNOSIS — M47812 Spondylosis without myelopathy or radiculopathy, cervical region: Secondary | ICD-10-CM

## 2020-07-29 NOTE — Patient Instructions (Addendum)
Plan: Avoid overhead lifting and overhead use of the arms. Do not lift greater than 5 lbs. Adjust head rest in vehicle to prevent hyperextension if rear ended. Take extra precautions to avoid falling, including use of a cane if you feel weak. MRi of the cervical spine ordered. Referral to Dr. Ninfa Linden for left total knee replacement.

## 2020-07-29 NOTE — Progress Notes (Signed)
Office Visit Note   Patient: Amy Moon           Date of Birth: 03-16-1951           MRN: [id number] Visit Date: 07/29/2020              Requested by: Lorrene Reid, PA-C [place],  [place] [address] PCP: Lorrene Reid, PA-C   Assessment & Plan: Visit Diagnoses:  1. Spondylosis without myelopathy or radiculopathy, cervical region   2. Cervical radiculopathy at C5   3. Spinal stenosis of cervical region   4. Chronic pain of both shoulders     Plan: Avoid overhead lifting and overhead use of the arms. Do not lift greater than 5 lbs. Adjust head rest in vehicle to prevent hyperextension if rear ended. Take extra precautions to avoid falling, including use of a cane if you feel weak. MRi of the cervical spine ordered. Referral to Dr. Ninfa Linden for left total knee replacement.      Follow-Up Instructions: Return in about 4 weeks (around 08/26/2020) for Schedule an appointment with Dr. Ninfa Linden for consideration of left total knee replacement..   Orders:  Orders Placed This Encounter  Procedures  . MR Cervical Spine w/o contrast   No orders of the defined types were placed in this encounter.     Procedures: No procedures performed   Clinical Data: Findings:  CLINICAL DATA: Chronic neck pain. Right shoulder and arm pain with numbness and paresthesia.  EXAM: MRI CERVICAL SPINE WITHOUT CONTRAST  TECHNIQUE: Multiplanar, multisequence MR imaging of the cervical spine was performed. No intravenous contrast was administered.  COMPARISON: 10/23/2017  FINDINGS: Alignment: 2 mm of anterolisthesis at C3-4  Vertebrae: No fracture, evidence of discitis, or bone lesion.  Cord: Normal signal and morphology.  Posterior Fossa, vertebral arteries, paraspinal tissues: Negative.  Disc levels:  C2-3: Asymmetric left facet spurring. Ligamentum flavum thickening. The canal and foramina remain patent  C3-4: Facet degeneration with spurring  asymmetric to the right. Disc narrowing with disc bulging and endplate/uncovertebral ridging. There is bilateral foraminal impingement and mild spinal stenosis with ventral cord flattening.  C4-5: Disc narrowing endplate and uncovertebral ridging. Minor facet spurring. Moderate left foraminal narrowing.  C5-6: Mild facet spurring. Mild disc narrowing. Left uncovertebral spurring causes mild foraminal narrowing  C6-7: Leftward disc bulging and uncovertebral ridging with moderate foraminal narrowing. Patent spinal canal  C7-T1:Unremarkable.  IMPRESSION: 1. Resolved C3-4 facet arthritis since 2019, but there is new mild anterolisthesis at this level. 2. Stable spinal stenosis at C3-4 and C4-5 with mild ventral cord flattening at C3-4. 3. Foraminal impingement bilaterally at C3-4, on the left at C4-5, and on the left at C6-7.   Electronically Signed By: Monte Fantasia M.D. On: 09/01/2019 05:51    Subjective: Chief Complaint  Patient presents with  . Neck - Follow-up  . Lower Back - Follow-up  . Right Shoulder - Follow-up    70 year old female with history of cervicalgia and bilateral knee osteoarthritis. She is seen today. Calling weekly for narcotics to relieve pain into the left neck and left shoulder, in the past with right shoulder pain. MRI with DDD and mild  Cervical stenosis at C3-4, C4-5 and C5-6. She is also reporting severe bilateral knee pain left knee worse than right with Varus deformity.    Review of Systems  Constitutional: Negative.   HENT: Negative.   Eyes: Negative.   Respiratory: Negative.   Cardiovascular: Negative.   Gastrointestinal:  Negative.   Endocrine: Negative.   Genitourinary: Negative.   Musculoskeletal: Negative.   Skin: Negative.   Allergic/Immunologic: Negative.   Neurological: Negative.   Hematological: Negative.   Psychiatric/Behavioral: Negative.      Objective: Vital Signs: BP 137/74 (BP Location: Left Arm, Patient  Position: Sitting)   Pulse (!) 105   Ht 5' (1.524 m)   Wt 145 lb (65.8 kg)   BMI 28.32 kg/m   Physical Exam Constitutional:      Appearance: She is well-developed and well-nourished.  HENT:     Head: Normocephalic and atraumatic.  Eyes:     Extraocular Movements: EOM normal.     Pupils: Pupils are equal, round, and reactive to light.  Pulmonary:     Effort: Pulmonary effort is normal.     Breath sounds: Normal breath sounds.  Abdominal:     General: Bowel sounds are normal.     Palpations: Abdomen is soft.  Musculoskeletal:     Cervical back: Normal range of motion and neck supple.     Lumbar back: Negative right straight leg raise test and negative left straight leg raise test.  Skin:    General: Skin is warm and dry.  Neurological:     Mental Status: She is alert and oriented to person, place, and time.  Psychiatric:        Mood and Affect: Mood and affect normal.        Behavior: Behavior normal.        Thought Content: Thought content normal.        Judgment: Judgment normal.     Back Exam   Tenderness  The patient is experiencing tenderness in the cervical.  Range of Motion  Extension:  10 abnormal  Flexion:  40 abnormal  Lateral bend right:  50 abnormal  Lateral bend left:  50 abnormal  Rotation right:  50 abnormal  Rotation left:  50 abnormal   Muscle Strength  Right Quadriceps:  5/5  Left Quadriceps:  5/5  Right Hamstrings:  5/5  Left Hamstrings:  5/5   Tests  Straight leg raise right: negative Straight leg raise left: negative  Other  Toe walk: normal Heel walk: normal  Comments:  Painful ROM C-spine.      Specialty Comments:  No specialty comments available.  Imaging: No results found.   PMFS History: Patient Active Problem List   Diagnosis Date Noted  . Coronary artery disease 04/02/2020  . Splenomegaly   . Portal hypertension ([place])   . Pneumonia   . Osteoporosis   . Osteopenia   . Neuropathy   . Low vitamin D level   .  History of kidney stones   . Hiatal hernia   . Hepatic disease   . Granulomatous disease ([place])   . GERD (gastroesophageal reflux disease)   . Enlarged thyroid   . Duodenal ulcer   . Drug-induced low platelet count   . Depression   . Clotting disorder ([place])   . Cirrhosis ([place])   . Chronic kidney disease   . Cataract   . Cancer ([place])   . Asthma   . Arthritis   . Angina pectoris ([place])   . Anemia   . Allergy   . Stage 3 chronic kidney disease ([place]) 10/21/2019  . History of smoking 25-50 pack years- quit age 37, smoked 2ppd from 70 yo-70 yo. 10/21/2019  . History of surgery- pt has several Sx- no personal issues wiht anesthesia  10/21/2019  . Sleep disturbance  10/21/2019  . History of exposure to asbestos 10/21/2019  . Family history of adverse reaction to anesthesia   . Dyspnea on exertion 10/17/2019  . Bronchitis 06/03/2018  . Encounter for Medicare annual wellness exam 05/09/2018  . Vitamin D deficiency 11/05/2017  . Encounter for hepatitis C virus screening test for high risk patient 11/05/2017  . Dysphagia 09/03/2017  . Thrombocytopenia ([place]) 09/03/2017  . Healthcare maintenance 07/30/2017  . Chronic pain syndrome 07/30/2017  . Chest pain 07/30/2017  . Parkinson's disease ([place]) 07/30/2017  . Hypertension 07/30/2017  . Severe obesity (BMI >= 40) ([place]) 05/15/2016  . Severe episode of recurrent major depressive disorder, without psychotic features ([place]) 05/15/2016  . Tremor of both hands 05/15/2016  . Nausea 03/01/2016  . Irritant contact dermatitis 08/25/2015   Past Medical History:  Diagnosis Date  . Allergy   . Anemia   . Angina pectoris ([place])   . Arthritis   . Asthma    years ago  . Bronchitis 06/03/2018  . Cancer (HCC)    squamous cell carcinoma, nose  . Cataract   . Chest pain 07/30/2017  . Chronic kidney disease    possibly per pt  . Chronic pain syndrome 07/30/2017  . Cirrhosis ([place])   . Clotting disorder (HCC)    platelets are low  . Coronary artery  disease 04/02/2020  . Depression   . Drug-induced low platelet count   . Duodenal ulcer   . Dyslipidemia 04/02/2020  . Dysphagia 09/03/2017  . Dyspnea on exertion 10/17/2019  . Encounter for hepatitis C virus screening test for high risk patient 11/05/2017  . Encounter for Medicare annual wellness exam 05/09/2018  . Enlarged thyroid   . Family history of adverse reaction to anesthesia    father had difficulty waking up and breathing on his own after surgery  . GERD (gastroesophageal reflux disease)   . Granulomatous disease ([place])   . Healthcare maintenance 07/30/2017  . Hepatic disease   . Hiatal hernia   . History of exposure to asbestos 10/21/2019  . History of kidney stones   . History of smoking 25-50 pack years- quit age 47, smoked 2ppd from 70 yo-70 yo. 10/21/2019  . History of surgery- pt has several Sx- no personal issues wiht anesthesia  10/21/2019  . Hypertension   . Irritant contact dermatitis 08/25/2015  . Low vitamin D level   . Nausea 03/01/2016  . Neuropathy   . Osteopenia   . Osteoporosis   . Parkinson's disease ([place])   . Pneumonia   . Portal hypertension ([place])   . Severe episode of recurrent major depressive disorder, without psychotic features ([place]) 05/15/2016  . Severe obesity (BMI >= 40) ([place]) 05/15/2016  . Sleep disturbance 10/21/2019  . Splenomegaly a  . Stage 3 chronic kidney disease ([place]) 10/21/2019  . Thrombocytopenia ([place]) 09/03/2017  . Tremor of both hands 05/15/2016  . Vitamin D deficiency 11/05/2017    Family History  Problem Relation Age of Onset  . Cancer Mother        ovarian  . Hyperlipidemia Mother   . Alzheimer's disease Mother   . Arthritis Mother        rheumatoid  . Cancer Father        lung  . Parkinson's disease Father   . Alzheimer's disease Father   . ALS Sister   . Cancer Maternal Aunt        pancreatic, lung, liver,breast  . Breast cancer Maternal Aunt   . Cancer Maternal Uncle  pancreatic, liver, brain  . Colon cancer Maternal  Uncle   . Heart attack Paternal Aunt   . Stroke Paternal Aunt   . Stomach cancer Paternal Aunt   . Heart attack Paternal Uncle   . Stroke Paternal Grandmother   . Heart attack Paternal Grandfather   . Arthritis Sister        rheumatoid  . Huntington's disease Daughter        presumed inherited from father per patient  . Colon cancer Maternal Uncle   . Pancreatic cancer Maternal Uncle   . Liver disease Maternal Uncle   . Breast cancer Maternal Aunt   . Breast cancer Maternal Aunt   . Esophageal cancer Neg Hx   . Rectal cancer Neg Hx     Past Surgical History:  Procedure Laterality Date  . ABDOMINAL HYSTERECTOMY    . APPENDECTOMY    . BACK SURGERY     thoracic-fractured   . BACK SURGERY     lumbar - fractured  . BREAST LUMPECTOMY WITH RADIOACTIVE SEED LOCALIZATION Right 08/01/2018   Procedure: RADIOACTIVE SEED GUIDED RIGHT BREAST LUMPECTOMY;  Surgeon: Coralie Keens, MD;  Location: [facility];  Service: General;  Laterality: Right;  . ESOPHAGOGASTRODUODENOSCOPY  05/20/2008   Mild to moderate esophagitis. Otherwise normal EGD.   Marland Kitchen EYE SURGERY Left    cataract surgery with lens implant  . GASTROSTOMY     peptic ulcer - peg tube placement   Social History   Occupational History  . Not on file  Tobacco Use  . Smoking status: Former Smoker    Packs/day: 1.00    Years: 22.00    Pack years: 22.00    Types: Cigarettes    Quit date: 06/26/1986    Years since quitting: 34.1  . Smokeless tobacco: Never Used  Vaping Use  . Vaping Use: Never used  Substance and Sexual Activity  . Alcohol use: No  . Drug use: No  . Sexual activity: Not Currently    Birth control/protection: None

## 2020-07-30 ENCOUNTER — Encounter: Payer: Medicare Other | Admitting: Physical Medicine and Rehabilitation

## 2020-08-02 ENCOUNTER — Ambulatory Visit: Payer: Medicare Other | Admitting: Cardiology

## 2020-08-05 ENCOUNTER — Other Ambulatory Visit: Payer: Self-pay | Admitting: Specialist

## 2020-08-06 ENCOUNTER — Institutional Professional Consult (permissible substitution): Payer: Medicare Other | Admitting: Pulmonary Disease

## 2020-08-07 ENCOUNTER — Ambulatory Visit (HOSPITAL_BASED_OUTPATIENT_CLINIC_OR_DEPARTMENT_OTHER): Payer: Medicare Other

## 2020-08-09 ENCOUNTER — Other Ambulatory Visit: Payer: Self-pay | Admitting: Specialist

## 2020-08-13 ENCOUNTER — Other Ambulatory Visit: Payer: Self-pay | Admitting: Specialist

## 2020-08-13 NOTE — Telephone Encounter (Signed)
Pls advise.  

## 2020-08-14 ENCOUNTER — Ambulatory Visit (HOSPITAL_BASED_OUTPATIENT_CLINIC_OR_DEPARTMENT_OTHER): Payer: Medicare Other

## 2020-08-19 ENCOUNTER — Ambulatory Visit: Payer: Medicare Other | Admitting: Specialist

## 2020-08-20 ENCOUNTER — Encounter: Payer: Medicare Other | Admitting: Physical Medicine and Rehabilitation

## 2020-08-21 ENCOUNTER — Ambulatory Visit (HOSPITAL_BASED_OUTPATIENT_CLINIC_OR_DEPARTMENT_OTHER)
Admission: RE | Admit: 2020-08-21 | Discharge: 2020-08-21 | Disposition: A | Discharge status: Home / Self Care | Payer: Medicare Other | Source: Ambulatory Visit | Attending: Specialist | Admitting: Specialist

## 2020-08-21 ENCOUNTER — Other Ambulatory Visit: Payer: Self-pay

## 2020-08-21 DIAGNOSIS — M25512 Pain in left shoulder: Secondary | ICD-10-CM | POA: Diagnosis not present

## 2020-08-21 DIAGNOSIS — M47812 Spondylosis without myelopathy or radiculopathy, cervical region: Secondary | ICD-10-CM

## 2020-08-21 DIAGNOSIS — M4802 Spinal stenosis, cervical region: Secondary | ICD-10-CM | POA: Diagnosis not present

## 2020-08-21 DIAGNOSIS — M5412 Radiculopathy, cervical region: Secondary | ICD-10-CM

## 2020-08-21 DIAGNOSIS — M25511 Pain in right shoulder: Secondary | ICD-10-CM | POA: Diagnosis not present

## 2020-08-21 DIAGNOSIS — M4319 Spondylolisthesis, multiple sites in spine: Secondary | ICD-10-CM | POA: Diagnosis not present

## 2020-08-21 DIAGNOSIS — G8929 Other chronic pain: Secondary | ICD-10-CM | POA: Diagnosis not present

## 2020-08-23 ENCOUNTER — Other Ambulatory Visit: Payer: Self-pay | Admitting: Specialist

## 2020-08-23 ENCOUNTER — Other Ambulatory Visit: Payer: Self-pay | Admitting: Cardiology

## 2020-08-23 ENCOUNTER — Other Ambulatory Visit: Payer: Self-pay | Admitting: Gastroenterology

## 2020-08-23 DIAGNOSIS — R110 Nausea: Secondary | ICD-10-CM

## 2020-08-23 DIAGNOSIS — R072 Precordial pain: Secondary | ICD-10-CM

## 2020-08-23 NOTE — Telephone Encounter (Signed)
Refill sent to pharmacy.   

## 2020-08-26 ENCOUNTER — Encounter: Payer: Self-pay | Admitting: Specialist

## 2020-08-26 ENCOUNTER — Ambulatory Visit: Payer: Medicare Other | Admitting: Specialist

## 2020-08-26 ENCOUNTER — Ambulatory Visit: Payer: Self-pay

## 2020-08-26 ENCOUNTER — Other Ambulatory Visit: Payer: Self-pay

## 2020-08-26 VITALS — BP 140/75 | HR 109 | Ht 60.0 in | Wt 145.0 lb

## 2020-08-26 DIAGNOSIS — M5412 Radiculopathy, cervical region: Secondary | ICD-10-CM

## 2020-08-26 DIAGNOSIS — M47812 Spondylosis without myelopathy or radiculopathy, cervical region: Secondary | ICD-10-CM

## 2020-08-26 DIAGNOSIS — M170 Bilateral primary osteoarthritis of knee: Secondary | ICD-10-CM

## 2020-08-26 NOTE — Progress Notes (Signed)
Office Visit Note   Patient: Amy Moon           Date of Birth: [birth year]           MRN: [id number] Visit Date: 08/26/2020              Requested by: Lorrene Reid, PA-C [address],  [place] [address] PCP: Lorrene Reid, PA-C   Assessment & Plan: Visit Diagnoses:  1. Bilateral primary osteoarthritis of knee   2. Spondylosis without myelopathy or radiculopathy, cervical region   3. Cervical radiculopathy at C5     Plan: Presently on Plavix, unable to tolerate NSAIDs,  She has severe OA of the knees and total knee replacement is indicated due to deformity and use of narcotics to  Relieve pain, she has night pain in the left knee and neck.  Obtain left C4 and C5 selective nerve root blocks and if this is successful in relieving the cervical pain then I would  Offer to perform left C4 and C5 foraminotomies.  Continue with hydrocodone for pain. Referred to Dr. Ninfa Linden for consideration of left TKR with eventual right TKR.  Follow-Up Instructions: Return in about 3 weeks (around 09/16/2020), or See me in 3 weeks for cervical spine follow up., for follow up with Dr. Ninfa Linden in 2 weeks for assessment for left total knee replacement.   Orders:  Orders Placed This Encounter  Procedures  . XR Knee 1-2 Views Left   No orders of the defined types were placed in this encounter.     Procedures: No procedures performed   Clinical Data: Findings:  Narrative & Impression CLINICAL DATA:  Spinal stenosis. Left shoulder pain. Cervicalgia.  EXAM: MRI CERVICAL SPINE WITHOUT CONTRAST  TECHNIQUE: Multiplanar, multisequence MR imaging of the cervical spine was performed. No intravenous contrast was administered.  COMPARISON:  MRI the cervical spine 08/30/2019  FINDINGS: Alignment: Slight degenerative anterolisthesis at C3-4, C5-6 and C7-T1 are stable.  Vertebrae: Progressive chronic fatty endplate marrow changes at C3-4 and C4-5 are noted.  Marrow signal and vertebral body heights are otherwise normal.  Cord: Normal signal is present in cervical and upper thoracic spinal cord to the lowest imaged level, T3.  Posterior Fossa, vertebral arteries, paraspinal tissues: Craniocervical junction is normal. Flow is present in the vertebral arteries bilaterally. Visualized intracranial contents are normal.  Disc levels:  C1-2: Negative.  C2-3: Leftward disc osteophyte complex similar the prior exam. Mild left foraminal narrowing is stable. Facet hypertrophy contributes.  C3-4: A broad-based disc osteophyte complex present. Uncovertebral spurring present bilaterally. This effaces the ventral CSF. Moderate bilateral foraminal narrowing is stable.  C4-5: A broad-based disc osteophyte complex is asymmetric to the left. This partially effaces the ventral CSF. Moderate left foraminal narrowing is stable.  C5-6: Broad-based disc osteophyte complex present. Mild effacement of ventral CSF is noted. Mild bilateral foraminal narrowing is stable.  C6-7: Asymmetric left-sided uncovertebral spurring is stable. Moderate left foraminal narrowing is stable.  C7-T1: Negative.  IMPRESSION: 1. Stable multilevel spondylosis of the cervical spine as described. 2. Mild left foraminal narrowing at C2-3. 3. Moderate bilateral foraminal narrowing at C3-4. 4. Moderate left foraminal stenosis at C4-5. 5. Mild bilateral foraminal narrowing at C5-6. 6. Moderate left foraminal stenosis at C6-7.  A   Electronically Signed   By: San Morelle M.D.   On: 08/22/2020 20:32      Subjective: Chief Complaint  Patient presents with  . Neck - Follow-up  . Left Knee -  Pain    70 year old right handed female with cervicalgia and left shoulder and arm pain, with numbness and paresthesias. She has pain with overhead use of the left side and with lying on the left side. She is weak on the left side and tends to drop items holding  in the left hand.  Underwent repeat MRI cervical spine this past Saturday 5days ago and today had radiograps of the left knee. Has been followed for severe varus left knee osteoarthritis.    Review of Systems  Constitutional: Negative.   HENT: Negative.   Eyes: Negative.   Respiratory: Negative.   Cardiovascular: Negative.   Gastrointestinal: Negative.   Endocrine: Negative.   Genitourinary: Negative.   Musculoskeletal: Negative.   Skin: Negative.   Allergic/Immunologic: Negative.   Neurological: Negative.   Hematological: Negative.   Psychiatric/Behavioral: Negative.      Objective: Vital Signs: BP 140/75 (BP Location: Left Arm, Patient Position: Sitting)   Pulse (!) 109   Ht 5' (1.524 m)   Wt 145 lb (65.8 kg)   BMI 28.32 kg/m   Physical Exam Constitutional:      Appearance: She is well-developed and well-nourished.  HENT:     Head: Normocephalic and atraumatic.  Eyes:     Extraocular Movements: EOM normal.     Pupils: Pupils are equal, round, and reactive to light.  Pulmonary:     Effort: Pulmonary effort is normal.     Breath sounds: Normal breath sounds.  Abdominal:     General: Bowel sounds are normal.     Palpations: Abdomen is soft.  Musculoskeletal:     Cervical back: Normal range of motion and neck supple. No tenderness.     Left knee:     Instability Tests: Medial McMurray test positive. Lateral McMurray test negative.  Skin:    General: Skin is warm and dry.  Neurological:     Mental Status: She is alert and oriented to person, place, and time.  Psychiatric:        Mood and Affect: Mood and affect normal.        Behavior: Behavior normal.        Thought Content: Thought content normal.        Judgment: Judgment normal.     Right Knee Exam   Tenderness  The patient is experiencing tenderness in the medial joint line and medial retinaculum.   Left Knee Exam   Tenderness  The patient is experiencing tenderness in the medial joint line and  medial retinaculum.  Range of Motion  Extension:  -10 abnormal  Flexion:  130 abnormal   Tests  McMurray:  Medial - positive Lateral - negative Varus: positive Valgus: negative Lachman:  Anterior - negative    Posterior - negative  Comments:  Left knee varus deformity, right knee mild varus deformity. Grating of the P-F joint and medial joint line.       Specialty Comments:  No specialty comments available.  Imaging: XR Knee 1-2 Views Left  Result Date: 08/26/2020 AP and lateral left knee demonstrated left medial joint line is narrowed to nearly bone on bone on both AP and lateral radiograph, there is subchondral sclerosis of the medial tibial plateau, medial subluxation of the distal medial femoral condyle on the medial tibial plateau. There is minimal spur or osteophyte over the medial tibial plateau and mild superior pole osteophyte of the patella with 2 areas of retropatella joint line erosion or irregular joint line.  Findings consistent  with severe medial varus osteoarthritis of the left knee.    PMFS History: Patient Active Problem List   Diagnosis Date Noted  . Coronary artery disease 04/02/2020  . Splenomegaly   . Portal hypertension ([place])   . Pneumonia   . Osteoporosis   . Osteopenia   . Neuropathy   . Low vitamin D level   . History of kidney stones   . Hiatal hernia   . Hepatic disease   . Granulomatous disease ([place])   . GERD (gastroesophageal reflux disease)   . Enlarged thyroid   . Duodenal ulcer   . Drug-induced low platelet count   . Depression   . Clotting disorder ([facility])   . Cirrhosis ([place])   . Chronic kidney disease   . Cataract   . Cancer ([place])   . Asthma   . Arthritis   . Angina pectoris ([place])   . Anemia   . Allergy   . Stage 3 chronic kidney disease ([place]) 10/21/2019  . History of smoking 25-50 pack years- quit age 89, smoked 2ppd from 70 yo-70 yo. 10/21/2019  . History of surgery- pt has several Sx- no personal issues wiht anesthesia   10/21/2019  . Sleep disturbance 10/21/2019  . History of exposure to asbestos 10/21/2019  . Family history of adverse reaction to anesthesia   . Dyspnea on exertion 10/17/2019  . Bronchitis 06/03/2018  . Encounter for Medicare annual wellness exam 05/09/2018  . Vitamin D deficiency 11/05/2017  . Encounter for hepatitis C virus screening test for high risk patient 11/05/2017  . Dysphagia 09/03/2017  . Thrombocytopenia ([place]) 09/03/2017  . Healthcare maintenance 07/30/2017  . Chronic pain syndrome 07/30/2017  . Chest pain 07/30/2017  . Parkinson's disease ([place]) 07/30/2017  . Hypertension 07/30/2017  . Severe obesity (BMI >= 40) ([place]) 05/15/2016  . Severe episode of recurrent major depressive disorder, without psychotic features ([place]) 05/15/2016  . Tremor of both hands 05/15/2016  . Nausea 03/01/2016  . Irritant contact dermatitis 08/25/2015   Past Medical History:  Diagnosis Date  . Allergy   . Anemia   . Angina pectoris ([place])   . Arthritis   . Asthma    years ago  . Bronchitis 06/03/2018  . Cancer ([facility])    squamous cell carcinoma, nose  . Cataract   . Chest pain 07/30/2017  . Chronic kidney disease    possibly per pt  . Chronic pain syndrome 07/30/2017  . Cirrhosis ([place])   . Clotting disorder ([facility])    platelets are low  . Coronary artery disease 04/02/2020  . Depression   . Drug-induced low platelet count   . Duodenal ulcer   . Dyslipidemia 04/02/2020  . Dysphagia 09/03/2017  . Dyspnea on exertion 10/17/2019  . Encounter for hepatitis C virus screening test for high risk patient 11/05/2017  . Encounter for Medicare annual wellness exam 05/09/2018  . Enlarged thyroid   . Family history of adverse reaction to anesthesia    father had difficulty waking up and breathing on his own after surgery  . GERD (gastroesophageal reflux disease)   . Granulomatous disease ([place])   . Healthcare maintenance 07/30/2017  . Hepatic disease   . Hiatal hernia   . History of exposure to asbestos  10/21/2019  . History of kidney stones   . History of smoking 25-50 pack years- quit age 60, smoked 2ppd from 70 yo-70 yo. 10/21/2019  . History of surgery- pt has several Sx- no personal issues wiht anesthesia  10/21/2019  .  Hypertension   . Irritant contact dermatitis 08/25/2015  . Low vitamin D level   . Nausea 03/01/2016  . Neuropathy   . Osteopenia   . Osteoporosis   . Parkinson's disease ([place])   . Pneumonia   . Portal hypertension ([place])   . Severe episode of recurrent major depressive disorder, without psychotic features ([place]) 05/15/2016  . Severe obesity (BMI >= 40) ([place]) 05/15/2016  . Sleep disturbance 10/21/2019  . Splenomegaly a  . Stage 3 chronic kidney disease ([place]) 10/21/2019  . Thrombocytopenia ([place]) 09/03/2017  . Tremor of both hands 05/15/2016  . Vitamin D deficiency 11/05/2017    Family History  Problem Relation Age of Onset  . Cancer Mother        ovarian  . Hyperlipidemia Mother   . Alzheimer's disease Mother   . Arthritis Mother        rheumatoid  . Cancer Father        lung  . Parkinson's disease Father   . Alzheimer's disease Father   . ALS Sister   . Cancer Maternal Aunt        pancreatic, lung, liver,breast  . Breast cancer Maternal Aunt   . Cancer Maternal Uncle        pancreatic, liver, brain  . Colon cancer Maternal Uncle   . Heart attack Paternal Aunt   . Stroke Paternal Aunt   . Stomach cancer Paternal Aunt   . Heart attack Paternal Uncle   . Stroke Paternal Grandmother   . Heart attack Paternal Grandfather   . Arthritis Sister        rheumatoid  . Huntington's disease Daughter        presumed inherited from father per patient  . Colon cancer Maternal Uncle   . Pancreatic cancer Maternal Uncle   . Liver disease Maternal Uncle   . Breast cancer Maternal Aunt   . Breast cancer Maternal Aunt   . Esophageal cancer Neg Hx   . Rectal cancer Neg Hx     Past Surgical History:  Procedure Laterality Date  . ABDOMINAL HYSTERECTOMY    .  APPENDECTOMY    . BACK SURGERY     thoracic-fractured   . BACK SURGERY     lumbar - fractured  . BREAST LUMPECTOMY WITH RADIOACTIVE SEED LOCALIZATION Right 08/01/2018   Procedure: RADIOACTIVE SEED GUIDED RIGHT BREAST LUMPECTOMY;  Surgeon: Coralie Keens, MD;  Location: [facility];  Service: General;  Laterality: Right;  . ESOPHAGOGASTRODUODENOSCOPY  05/20/2008   Mild to moderate esophagitis. Otherwise normal EGD.   Marland Kitchen EYE SURGERY Left    cataract surgery with lens implant  . GASTROSTOMY     peptic ulcer - peg tube placement   Social History   Occupational History  . Not on file  Tobacco Use  . Smoking status: Former Smoker    Packs/day: 1.00    Years: 22.00    Pack years: 22.00    Types: Cigarettes    Quit date: 06/26/1986    Years since quitting: 34.1  . Smokeless tobacco: Never Used  Vaping Use  . Vaping Use: Never used  Substance and Sexual Activity  . Alcohol use: No  . Drug use: No  . Sexual activity: Not Currently    Birth control/protection: None

## 2020-08-31 ENCOUNTER — Other Ambulatory Visit: Payer: Self-pay | Admitting: Specialist

## 2020-09-02 ENCOUNTER — Other Ambulatory Visit: Payer: Medicare Other

## 2020-09-06 ENCOUNTER — Other Ambulatory Visit: Payer: Self-pay | Admitting: Specialist

## 2020-09-06 NOTE — Telephone Encounter (Signed)
Please advise 

## 2020-09-07 ENCOUNTER — Other Ambulatory Visit: Payer: Self-pay | Admitting: Cardiology

## 2020-09-07 NOTE — Telephone Encounter (Signed)
Potassium CL approved and sent 

## 2020-09-09 ENCOUNTER — Other Ambulatory Visit: Payer: Self-pay | Admitting: Specialist

## 2020-09-14 ENCOUNTER — Other Ambulatory Visit: Payer: Self-pay | Admitting: Specialist

## 2020-09-15 ENCOUNTER — Other Ambulatory Visit: Payer: Self-pay

## 2020-09-15 ENCOUNTER — Encounter: Payer: Self-pay | Admitting: Physical Medicine and Rehabilitation

## 2020-09-15 ENCOUNTER — Ambulatory Visit: Payer: Self-pay

## 2020-09-15 ENCOUNTER — Ambulatory Visit (INDEPENDENT_AMBULATORY_CARE_PROVIDER_SITE_OTHER): Payer: Medicare Other | Admitting: Physical Medicine and Rehabilitation

## 2020-09-15 VITALS — BP 135/78 | HR 94

## 2020-09-15 DIAGNOSIS — M5412 Radiculopathy, cervical region: Secondary | ICD-10-CM

## 2020-09-15 MED ORDER — DEXAMETHASONE SODIUM PHOSPHATE 10 MG/ML IJ SOLN: 15.0000 mg | Freq: Once | INTRAMUSCULAR | Status: DC

## 2020-09-15 NOTE — Patient Instructions (Signed)

## 2020-09-15 NOTE — Progress Notes (Signed)
Amy Moon - 70 y.o. female MRN [id number]  Date of birth: September 02, 1950  Office Visit Note: Visit Date: 09/15/2020 PCP: Lorrene Reid, PA-C Referred by: Lorrene Reid, PA-C  Subjective: Chief Complaint  Patient presents with  . Neck - Pain  . Right Shoulder - Pain  . Left Shoulder - Pain   HPI:  Amy Moon is a 70 y.o. female who comes in today at the request of Dr. Basil Dess for planned Left C3-4 and C4-5 (C4 and C5 nerve roots) cervical selective nerve root block injection with fluoroscopic guidance.  The patient has failed conservative care including home exercise, medications, time and activity modification.  This injection will be diagnostic and hopefully therapeutic.  Please see requesting physician notes for further details and justification. MRI reviewed with images and spine model.  MRI reviewed in the note below.  She is following up with Dr. Louanne Skye tomorrow and they can review the results of this I told her to keep track during the day today after the injection with the numbing medicine in place.  She also has a situation with her right foot where something has falling on her foot but she did not quite remember what it was but she does have some bruising.  She will talk to Dr. Louanne Skye about that tomorrow.  Has tolerated very small amount of iodinated contrast in the past and we did use a very small amount today around the nerve roots.    ROS Otherwise per HPI.  Assessment & Plan: Visit Diagnoses:    ICD-10-CM   1. Cervical radiculopathy  M54.12 XR C-ARM NO REPORT    Nerve Block    DISCONTINUED: dexamethasone (DECADRON) injection 15 mg    Plan: No additional findings.   Meds & Orders:  Meds ordered this encounter  Medications  . DISCONTD: dexamethasone (DECADRON) injection 15 mg    Orders Placed This Encounter  Procedures  . Nerve Block  . XR C-ARM NO REPORT    Follow-up: Return for visit to requesting physician as needed.   Procedures: No procedures  performed  Cervical Transforaminal Selective Nerve Root Block with Fluoroscopic Guidance   Patient: Amy Moon      Date of Birth: [birth year] MRN: [id number] PCP: Lorrene Reid, PA-C      Visit Date: 09/15/2020   Universal Protocol:    Date/Time: 03/23/223:01 PM  Consent Given By: the patient  Position: SUPINE  Additional Comments: Vital signs were monitored before and after the procedure. Patient was prepped and draped in the usual sterile fashion. The correct patient, procedure, and site was verified.   Injection Procedure Details:  Procedure Site One Meds Administered:  Meds ordered this encounter  Medications  . DISCONTD: dexamethasone (DECADRON) injection 15 mg     Laterality: Left  Location/Site: C4 and C5 SNRB C3-4 C4-5  Needle size: 25 G  Needle type: spinal needle  Needle Placement: Just at the determined foramen  Findings:    -Comments: Excellent flow of contrast along the nerve and into the epidural space.  Procedure Details: The C-arm was obliqued to the ipsilateral side of the patient to about 45 from AP.  The C-arm was then tilted to square off the inferior endplate of the level targeted.  C-arm obliquity was then adjusted to maximize the size of the foramen.  The skin over the targeted area which was the posterior inferior portion of the foramen was then anesthetized with a small amount of 1% lidocaine without epinephrine.  A  25-gauge 2-1/2 inch spinal needle was then introduced through the skin and down to the targeted area under biplanar fluoroscopic guidance.  Multiple AP and lateral/oblique views were monitored during needle manipulation.  Final placement was with the needle just outside of the 6 o'clock position on the AP view.  After negative aspirate for air, blood, and CSF, a 1 ml. volume of Isovue-250 was injected into the foramen and the flow of contrast was observed. Radiographs were obtained for documentation purposes.   When there was  no arterial or venous flow of contrast, the injectate was administered into the level noted above.     Additional Comments:  The patient tolerated the procedure well Dressing: 2 x 2 sterile gauze with Band-Aid    Post-procedure details: Patient was observed during the procedure. Post-procedure instructions were reviewed.  Patient left the clinic in stable condition.      Clinical History: MRI CERVICAL SPINE WITHOUT CONTRAST  TECHNIQUE: Multiplanar, multisequence MR imaging of the cervical spine was performed. No intravenous contrast was administered.  COMPARISON:  MRI the cervical spine 08/30/2019  FINDINGS: Alignment: Slight degenerative anterolisthesis at C3-4, C5-6 and C7-T1 are stable.  Vertebrae: Progressive chronic fatty endplate marrow changes at C3-4 and C4-5 are noted. Marrow signal and vertebral body heights are otherwise normal.  Cord: Normal signal is present in cervical and upper thoracic spinal cord to the lowest imaged level, T3.  Posterior Fossa, vertebral arteries, paraspinal tissues: Craniocervical junction is normal. Flow is present in the vertebral arteries bilaterally. Visualized intracranial contents are normal.  Disc levels:  C1-2: Negative.  C2-3: Leftward disc osteophyte complex similar the prior exam. Mild left foraminal narrowing is stable. Facet hypertrophy contributes.  C3-4: A broad-based disc osteophyte complex present. Uncovertebral spurring present bilaterally. This effaces the ventral CSF. Moderate bilateral foraminal narrowing is stable.  C4-5: A broad-based disc osteophyte complex is asymmetric to the left. This partially effaces the ventral CSF. Moderate left foraminal narrowing is stable.  C5-6: Broad-based disc osteophyte complex present. Mild effacement of ventral CSF is noted. Mild bilateral foraminal narrowing is stable.  C6-7: Asymmetric left-sided uncovertebral spurring is stable. Moderate left  foraminal narrowing is stable.  C7-T1: Negative.  IMPRESSION: 1. Stable multilevel spondylosis of the cervical spine as described. 2. Mild left foraminal narrowing at C2-3. 3. Moderate bilateral foraminal narrowing at C3-4. 4. Moderate left foraminal stenosis at C4-5. 5. Mild bilateral foraminal narrowing at C5-6. 6. Moderate left foraminal stenosis at C6-7.  A   Electronically Signed   By: San Morelle M.D.   On: 08/22/2020 20:32     Objective:  VS:  HT:    WT:   BMI:     BP:135/78  HR:94bpm  TEMP: ( )  RESP:  Physical Exam Vitals and nursing note reviewed.  Constitutional:      General: She is not in acute distress.    Appearance: Normal appearance. She is not ill-appearing.  HENT:     Head: Normocephalic and atraumatic.     Right Ear: External ear normal.     Left Ear: External ear normal.  Eyes:     Extraocular Movements: Extraocular movements intact.  Cardiovascular:     Rate and Rhythm: Normal rate.     Pulses: Normal pulses.  Musculoskeletal:     Cervical back: Tenderness present. No rigidity.     Right lower leg: No edema.     Left lower leg: No edema.     Comments: Patient has good strength in the upper  extremities including 5 out of 5 strength in wrist extension long finger flexion and APB.  There is no atrophy of the hands intrinsically.  There is a negative Hoffmann's test.  Examination of the right foot show some ecchymoses in the first dorsal space and anterior part of the foot with some generalized swelling but no specific swelling.  She is able to bear weight.  Lymphadenopathy:     Cervical: No cervical adenopathy.  Skin:    Findings: No erythema, lesion or rash.  Neurological:     General: No focal deficit present.     Mental Status: She is alert and oriented to person, place, and time.     Sensory: No sensory deficit.     Motor: No weakness or abnormal muscle tone.     Coordination: Coordination normal.  Psychiatric:        Mood  and Affect: Mood normal.        Behavior: Behavior normal.      Imaging: XR C-ARM NO REPORT  Result Date: 09/15/2020 Please see Notes tab for imaging impression.

## 2020-09-15 NOTE — Procedures (Signed)
Cervical Transforaminal Selective Nerve Root Block with Fluoroscopic Guidance   Patient: Amy Moon      Date of Birth: May 06, 1951 MRN: [id number] PCP: Lorrene Reid, PA-C      Visit Date: 09/15/2020   Universal Protocol:    Date/Time: 03/23/223:01 PM  Consent Given By: the patient  Position: SUPINE  Additional Comments: Vital signs were monitored before and after the procedure. Patient was prepped and draped in the usual sterile fashion. The correct patient, procedure, and site was verified.   Injection Procedure Details:  Procedure Site One Meds Administered:  Meds ordered this encounter  Medications  . DISCONTD: dexamethasone (DECADRON) injection 15 mg     Laterality: Left  Location/Site: C4 and C5 SNRB C3-4 C4-5  Needle size: 25 G  Needle type: spinal needle  Needle Placement: Just at the determined foramen  Findings:    -Comments: Excellent flow of contrast along the nerve and into the epidural space.  Procedure Details: The C-arm was obliqued to the ipsilateral side of the patient to about 45 from AP.  The C-arm was then tilted to square off the inferior endplate of the level targeted.  C-arm obliquity was then adjusted to maximize the size of the foramen.  The skin over the targeted area which was the posterior inferior portion of the foramen was then anesthetized with a small amount of 1% lidocaine without epinephrine.  A 25-gauge 2-1/2 inch spinal needle was then introduced through the skin and down to the targeted area under biplanar fluoroscopic guidance.  Multiple AP and lateral/oblique views were monitored during needle manipulation.  Final placement was with the needle just outside of the 6 o'clock position on the AP view.  After negative aspirate for air, blood, and CSF, a 1 ml. volume of Isovue-250 was injected into the foramen and the flow of contrast was observed. Radiographs were obtained for documentation purposes.   When there was no arterial  or venous flow of contrast, the injectate was administered into the level noted above.     Additional Comments:  The patient tolerated the procedure well Dressing: 2 x 2 sterile gauze with Band-Aid    Post-procedure details: Patient was observed during the procedure. Post-procedure instructions were reviewed.  Patient left the clinic in stable condition.

## 2020-09-15 NOTE — Progress Notes (Signed)
Pt state neck pain that travels down to both shoulders. Pt state turning her head makes the pain worse. Pt state she take pain med to help ease her pain.  Numeric Pain Rating Scale and Functional Assessment Average Pain 7   In the last MONTH (on 0-10 scale) has pain interfered with the following?  1. General activity like being  able to carry out your everyday physical activities such as walking, climbing stairs, carrying groceries, or moving a chair?  Rating(10)   +Driver, +BT, -Dye Allergies.

## 2020-09-16 ENCOUNTER — Ambulatory Visit: Payer: Medicare Other | Admitting: Specialist

## 2020-09-16 ENCOUNTER — Encounter: Payer: Self-pay | Admitting: Specialist

## 2020-09-16 ENCOUNTER — Ambulatory Visit: Payer: Self-pay

## 2020-09-16 VITALS — BP 139/82 | HR 105 | Ht 60.0 in | Wt 145.0 lb

## 2020-09-16 DIAGNOSIS — M47812 Spondylosis without myelopathy or radiculopathy, cervical region: Secondary | ICD-10-CM | POA: Diagnosis not present

## 2020-09-16 DIAGNOSIS — M79671 Pain in right foot: Secondary | ICD-10-CM | POA: Diagnosis not present

## 2020-09-16 DIAGNOSIS — S9031XA Contusion of right foot, initial encounter: Secondary | ICD-10-CM | POA: Diagnosis not present

## 2020-09-16 DIAGNOSIS — M4807 Spinal stenosis, lumbosacral region: Secondary | ICD-10-CM | POA: Diagnosis not present

## 2020-09-16 DIAGNOSIS — S93601A Unspecified sprain of right foot, initial encounter: Secondary | ICD-10-CM

## 2020-09-16 DIAGNOSIS — M170 Bilateral primary osteoarthritis of knee: Secondary | ICD-10-CM | POA: Diagnosis not present

## 2020-09-16 MED ORDER — HYDROCODONE-ACETAMINOPHEN 5-325 MG PO TABS: ORAL_TABLET | ORAL | 0 refills | Status: DC

## 2020-09-16 NOTE — Progress Notes (Signed)
Office Visit Note   Patient: Amy Moon           Date of Birth: [birth year]           MRN: [id number] Visit Date: 09/16/2020              Requested by: Amy Reid, PA-C [place],  [place] [id number] PCP: Amy Reid, PA-C   Assessment & Plan: Visit Diagnoses:  1. Right foot pain   2. Right foot sprain, initial encounter   3. Hematoma of right foot   4. Bilateral primary osteoarthritis of knee   5. Spondylosis without myelopathy or radiculopathy, cervical region   6. Spinal stenosis of lumbosacral region     Plan: Plan: Avoid overhead lifting and overhead use of the arms. Do not lift greater than 5 lbs. Adjust head rest in vehicle to prevent hyperextension if rear ended. Take extra precautions to avoid falling, including use of a cane if you feel weak.  Referral to Dr. Ninfa Moon for left total knee replacement.    Right foot contusion with dorsal hematoma and sprain. Use ace wrap with cotton between the toes to decrease moisture and  Pressure between the toes with the ACE wrap.Ice the foot for 15 min then off for 30 min for pain and to decrease hematoma formation then you may use moist heat after 2 day. Wooden post op healing shoe to ease the use of the ACE wrap and to decrease movement across the forefoot, placing the foot at  Rest.  Requests a great quantity of pain meds as she is leaving to go visit sister in [place] and will not be able to fill meds in  while there. Will provide #40 hydrocodone.  Follow-Up Instructions: No follow-ups on file.   Orders:  Orders Placed This Encounter  Procedures  . XR Foot Complete Right   No orders of the defined types were placed in this encounter.     Procedures: No procedures performed   Clinical Data: No additional findings.   Subjective: Chief Complaint  Patient presents with  . Neck - Follow-up  . Right Foot - Pain, Edema, Injury    Dropped salt shaker on her foot.    70 year  old female with history of cervicalgia and lumbar spinal stenosis and bilateral severe varus osteoarthriitis. Today presents with Right foot injury, dropped a salt shaker on the dorsolateral right fore foot. She has had swelling and ecchymosis since then. Is walking in A house slipper. Complains of the knees hurting and the cervical injections doing nothing for her pain in the neck and head aches. I had been considering intervention as she is requiring narcotics to be renewed nearly weekly. Knees are painful but she reports not having seen Dr. Ninfa Moon as yet.    Review of Systems  Constitutional: Negative.   HENT: Negative.   Eyes: Negative.   Respiratory: Negative.   Cardiovascular: Negative.   Gastrointestinal: Negative.   Endocrine: Negative.   Genitourinary: Negative.   Musculoskeletal: Negative.   Skin: Negative.   Allergic/Immunologic: Negative.   Neurological: Negative.   Hematological: Negative.   Psychiatric/Behavioral: Negative.      Objective: Vital Signs: BP 139/82 (BP Location: Left Arm, Patient Position: Sitting)   Pulse (!) 105   Wt 145 lb (65.8 kg)   BMI 28.32 kg/m   Physical Exam Constitutional:      Appearance: She is well-developed.  HENT:     Head: Normocephalic and atraumatic.  Eyes:     Pupils: Pupils are equal, round, and reactive to light.  Pulmonary:     Effort: Pulmonary effort is normal.     Breath sounds: Normal breath sounds.  Abdominal:     General: Bowel sounds are normal.     Palpations: Abdomen is soft.  Musculoskeletal:     Cervical back: Normal range of motion and neck supple.     Right knee:     Instability Tests: Medial McMurray test positive.     Left knee:     Instability Tests: Medial McMurray test positive.  Skin:    General: Skin is warm and dry.  Neurological:     Mental Status: She is alert and oriented to person, place, and time.  Psychiatric:        Behavior: Behavior normal.        Thought Content: Thought content  normal.        Judgment: Judgment normal.     Right Ankle Exam  Right ankle exam is normal.  Other  Sensation: normal Pulse: present   Comments:  Right foot with dorsal lateral forefoot swelling and ecchymosis. Painful to pull longitudinally on 2nd through 4th toes.   Right Knee Exam   Range of Motion  Extension: -5  Flexion:  120 abnormal   Tests  McMurray:  Medial - positive  Varus: negative Valgus: positive Lachman:  Anterior - negative    Posterior - negative Drawer:  Anterior - negative    Posterior - negative Pivot shift: negative Patellar apprehension: positive  Other  Erythema: absent Scars: absent Sensation: normal Pulse: present Swelling: mild  Comments:  Grating of the P-F joint. Varus deformity.   Left Knee Exam   Range of Motion  Extension: -10  Flexion: 120   Tests  McMurray:  Medial - positive  Varus: negative Valgus: positive Lachman:  Anterior - negative    Posterior - negative Drawer:  Anterior - negative     Posterior - negative Pivot shift: negative Patellar apprehension: positive  Other  Erythema: absent Sensation: normal Pulse: present Swelling: mild  Comments:  Grating of the P-F joint. Varus deformity.      Specialty Comments:  No specialty comments available.  Imaging: No results found.   PMFS History: Patient Active Problem List   Diagnosis Date Noted  . Coronary artery disease 04/02/2020  . Splenomegaly   . Portal hypertension ([place])   . Pneumonia   . Osteoporosis   . Osteopenia   . Neuropathy   . Low vitamin D level   . History of kidney stones   . Hiatal hernia   . Hepatic disease   . Granulomatous disease ([place])   . GERD (gastroesophageal reflux disease)   . Enlarged thyroid   . Duodenal ulcer   . Drug-induced low platelet count   . Depression   . Clotting disorder ([place])   . Cirrhosis ([place])   . Chronic kidney disease   . Cataract   . Cancer ([place])   . Asthma   . Arthritis   . Angina pectoris  ([place])   . Anemia   . Allergy   . Stage 3 chronic kidney disease ([place]) 10/21/2019  . History of smoking 25-50 pack years- quit age 53, smoked 2ppd from 70 yo-70 yo. 10/21/2019  . History of surgery- pt has several Sx- no personal issues wiht anesthesia  10/21/2019  . Sleep disturbance 10/21/2019  . History of exposure to asbestos 10/21/2019  . Family history of adverse  reaction to anesthesia   . Dyspnea on exertion 10/17/2019  . Bronchitis 06/03/2018  . Encounter for Medicare annual wellness exam 05/09/2018  . Vitamin D deficiency 11/05/2017  . Encounter for hepatitis C virus screening test for high risk patient 11/05/2017  . Dysphagia 09/03/2017  . Thrombocytopenia ([place]) 09/03/2017  . Healthcare maintenance 07/30/2017  . Chronic pain syndrome 07/30/2017  . Chest pain 07/30/2017  . Parkinson's disease ([place]) 07/30/2017  . Hypertension 07/30/2017  . Severe obesity (BMI >= 40) ([place]) 05/15/2016  . Severe episode of recurrent major depressive disorder, without psychotic features ([place]) 05/15/2016  . Tremor of both hands 05/15/2016  . Nausea 03/01/2016  . Irritant contact dermatitis 08/25/2015   Past Medical History:  Diagnosis Date  . Allergy   . Anemia   . Angina pectoris ([place])   . Arthritis   . Asthma    years ago  . Bronchitis 06/03/2018  . Cancer ([facility])    squamous cell carcinoma, nose  . Cataract   . Chest pain 07/30/2017  . Chronic kidney disease    possibly per pt  . Chronic pain syndrome 07/30/2017  . Cirrhosis ([place])   . Clotting disorder ([facility])    platelets are low  . Coronary artery disease 04/02/2020  . Depression   . Drug-induced low platelet count   . Duodenal ulcer   . Dyslipidemia 04/02/2020  . Dysphagia 09/03/2017  . Dyspnea on exertion 10/17/2019  . Encounter for hepatitis C virus screening test for high risk patient 11/05/2017  . Encounter for Medicare annual wellness exam 05/09/2018  . Enlarged thyroid   . Family history of adverse reaction to anesthesia     father had difficulty waking up and breathing on his own after surgery  . GERD (gastroesophageal reflux disease)   . Granulomatous disease ([place])   . Healthcare maintenance 07/30/2017  . Hepatic disease   . Hiatal hernia   . History of exposure to asbestos 10/21/2019  . History of kidney stones   . History of smoking 25-50 pack years- quit age 53, smoked 2ppd from 70 yo-70 yo. 10/21/2019  . History of surgery- pt has several Sx- no personal issues wiht anesthesia  10/21/2019  . Hypertension   . Irritant contact dermatitis 08/25/2015  . Low vitamin D level   . Nausea 03/01/2016  . Neuropathy   . Osteopenia   . Osteoporosis   . Parkinson's disease ([place])   . Pneumonia   . Portal hypertension ([place])   . Severe episode of recurrent major depressive disorder, without psychotic features ([place]) 05/15/2016  . Severe obesity (BMI >= 40) ([place]) 05/15/2016  . Sleep disturbance 10/21/2019  . Splenomegaly a  . Stage 3 chronic kidney disease ([place]) 10/21/2019  . Thrombocytopenia ([place]) 09/03/2017  . Tremor of both hands 05/15/2016  . Vitamin D deficiency 11/05/2017    Family History  Problem Relation Age of Onset  . Cancer Mother        ovarian  . Hyperlipidemia Mother   . Alzheimer's disease Mother   . Arthritis Mother        rheumatoid  . Cancer Father        lung  . Parkinson's disease Father   . Alzheimer's disease Father   . ALS Sister   . Cancer Maternal Aunt        pancreatic, lung, liver,breast  . Breast cancer Maternal Aunt   . Cancer Maternal Uncle        pancreatic, liver, brain  . Colon cancer Maternal  Uncle   . Heart attack Paternal Aunt   . Stroke Paternal Aunt   . Stomach cancer Paternal Aunt   . Heart attack Paternal Uncle   . Stroke Paternal Grandmother   . Heart attack Paternal Grandfather   . Arthritis Sister        rheumatoid  . Huntington's disease Daughter        presumed inherited from father per patient  . Colon cancer Maternal Uncle   . Pancreatic cancer Maternal  Uncle   . Liver disease Maternal Uncle   . Breast cancer Maternal Aunt   . Breast cancer Maternal Aunt   . Esophageal cancer Neg Hx   . Rectal cancer Neg Hx     Past Surgical History:  Procedure Laterality Date  . ABDOMINAL HYSTERECTOMY    . APPENDECTOMY    . BACK SURGERY     thoracic-fractured   . BACK SURGERY     lumbar - fractured  . BREAST LUMPECTOMY WITH RADIOACTIVE SEED LOCALIZATION Right 08/01/2018   Procedure: RADIOACTIVE SEED GUIDED RIGHT BREAST LUMPECTOMY;  Surgeon: Coralie Keens, MD;  Location: [facility];  Service: General;  Laterality: Right;  . ESOPHAGOGASTRODUODENOSCOPY  05/20/2008   Mild to moderate esophagitis. Otherwise normal EGD.   [facility] EYE SURGERY Left    cataract surgery with lens implant  . GASTROSTOMY     peptic ulcer - peg tube placement   Social History   Occupational History  . Not on file  Tobacco Use  . Smoking status: Former Smoker    Packs/day: 1.00    Years: 22.00    Pack years: 22.00    Types: Cigarettes    Quit date: 06/26/1986    Years since quitting: 34.2  . Smokeless tobacco: Never Used  Vaping Use  . Vaping Use: Never used  Substance and Sexual Activity  . Alcohol use: No  . Drug use: No  . Sexual activity: Not Currently    Birth control/protection: None

## 2020-09-16 NOTE — Patient Instructions (Addendum)
Plan: Avoid overhead lifting and overhead use of the arms. Do not lift greater than 5 lbs. Adjust head rest in vehicle to prevent hyperextension if rear ended. Take extra precautions to avoid falling, including use of a cane if you feel weak. MRi of the cervical spine ordered. Referral to Dr. Ninfa Linden for left total knee replacement.  Use the ACE wrap for the right foot for 3-4 days then discontinue. Use ice 15 min on and 30 min off for swelling and pain associated with the right dorsal foot.  Use the wooden healing shoe to support the right foot and place at rest when walking by limiting  The right forefoot movement.   Requests a great quantity of pain meds as she is leaving to go visit sister in [place] and will not be able to fill meds in [place] while there. Will provide #40 hydrocodone.

## 2020-09-23 ENCOUNTER — Other Ambulatory Visit: Payer: Self-pay | Admitting: Physician Assistant

## 2020-09-23 DIAGNOSIS — G4700 Insomnia, unspecified: Secondary | ICD-10-CM

## 2020-09-24 ENCOUNTER — Ambulatory Visit: Payer: Medicare Other | Admitting: Specialist

## 2020-09-24 ENCOUNTER — Other Ambulatory Visit: Payer: Self-pay | Admitting: Specialist

## 2020-09-24 NOTE — Telephone Encounter (Signed)
Patient last seen 07/08/20 and advised to follow up in 8 wks.   Last refill given 07/21/20 #30 with 1 refill.  Please approve if refills is appropriate.

## 2020-09-28 ENCOUNTER — Other Ambulatory Visit: Payer: Self-pay | Admitting: Physician Assistant

## 2020-09-28 DIAGNOSIS — G4700 Insomnia, unspecified: Secondary | ICD-10-CM

## 2020-09-28 NOTE — Telephone Encounter (Signed)
Last apt 07/08/20 and advised to follow up in 8 wks.   No future apt scheduled.   Please approve if refill deemed appropriate. AS, CMA

## 2020-09-28 NOTE — Telephone Encounter (Signed)
Patient needs a refill on her sleep medication eszopiclone and uses [facility], thanks.

## 2020-09-28 NOTE — Addendum Note (Signed)
Addended by: Mickel Crow on: 09/28/2020 09:16 AM   Modules accepted: Orders

## 2020-09-29 NOTE — Telephone Encounter (Signed)
Pt advised to follow up for refill of med per last AVS. Future apt scheduled 4/12. AS< CMA

## 2020-09-30 ENCOUNTER — Other Ambulatory Visit: Payer: Self-pay | Admitting: Specialist

## 2020-09-30 ENCOUNTER — Other Ambulatory Visit: Payer: Self-pay

## 2020-10-01 ENCOUNTER — Ambulatory Visit (INDEPENDENT_AMBULATORY_CARE_PROVIDER_SITE_OTHER): Payer: Medicare Other | Admitting: Cardiology

## 2020-10-01 ENCOUNTER — Encounter: Payer: Self-pay | Admitting: Cardiology

## 2020-10-01 ENCOUNTER — Other Ambulatory Visit: Payer: Self-pay

## 2020-10-01 VITALS — BP 126/62 | HR 82 | Ht 60.0 in | Wt 160.0 lb

## 2020-10-01 DIAGNOSIS — K766 Portal hypertension: Secondary | ICD-10-CM | POA: Diagnosis not present

## 2020-10-01 DIAGNOSIS — Z79899 Other long term (current) drug therapy: Secondary | ICD-10-CM

## 2020-10-01 DIAGNOSIS — I2510 Atherosclerotic heart disease of native coronary artery without angina pectoris: Secondary | ICD-10-CM

## 2020-10-01 DIAGNOSIS — I209 Angina pectoris, unspecified: Secondary | ICD-10-CM

## 2020-10-01 DIAGNOSIS — I10 Essential (primary) hypertension: Secondary | ICD-10-CM | POA: Diagnosis not present

## 2020-10-01 MED ORDER — ISOSORBIDE MONONITRATE ER 30 MG PO TB24: 30.0000 mg | ORAL_TABLET | Freq: Every day | ORAL | 1 refills | Status: DC

## 2020-10-01 NOTE — Progress Notes (Signed)
Cardiology Office Note:    Date:  10/01/2020   ID:  Amy Moon, DOB 1950/12/19, MRN [id number]  PCP:  Lorrene Reid, PA-C  Cardiologist:  Jenne Campus, MD    Referring MD: Lorrene Reid, PA-C   Chief Complaint  Patient presents with  . Chest Pain  . Shortness of Breath  . UNSTABLE BALANCE  . FEET AND BIL LEG SWELLING    History of Present Illness:    Amy Moon is a 70 y.o. female with past medical history significant for chronic smoking, as best for exposure, she initially was referred to [place] because of shortness of breath as well as atypical chest pain we did have coronary CT angio which show some significant calcium deposits not in terms of obstruction she had 65% stenosis of LAD also some noncritical lesion in the right coronary artery conclusion of the time was drawn that her pain was probably not related to coronary artery disease and she does have nonobstructive coronary disease however with possibility of small vessel disease I started treating her with antianginal therapy.  She also does have history of cirrhosis of the liver but doing quite well and stable from that point review.  She also had history of swelling of lower extremities but successfully managed with diuretics. Comes today 2 months of follow-up she is not doing well she says she still having chest pain she takes nitroglycerin pain is relieved pain however happens not with exercise.  She complained of having shortness of breath and she thinks that may be a little worse than it was before.  Swelling of lower extremities still present.  But overall fairly well controlled with furosemide.  Past Medical History:  Diagnosis Date  . Allergy   . Anemia   . Angina pectoris ([place])   . Arthritis   . Asthma    years ago  . Bronchitis 06/03/2018  . Cancer (HCC)    squamous cell carcinoma, nose  . Cataract   . Chest pain 07/30/2017  . Chronic kidney disease    possibly per pt  . Chronic pain syndrome 07/30/2017   . Cirrhosis ([place])   . Clotting disorder (HCC)    platelets are low  . Coronary artery disease 04/02/2020  . Depression   . Drug-induced low platelet count   . Duodenal ulcer   . Dyslipidemia 04/02/2020  . Dysphagia 09/03/2017  . Dyspnea on exertion 10/17/2019  . Encounter for hepatitis C virus screening test for high risk patient 11/05/2017  . Encounter for Medicare annual wellness exam 05/09/2018  . Enlarged thyroid   . Family history of adverse reaction to anesthesia    father had difficulty waking up and breathing on his own after surgery  . GERD (gastroesophageal reflux disease)   . Granulomatous disease ([place])   . Healthcare maintenance 07/30/2017  . Hepatic disease   . Hiatal hernia   . History of exposure to asbestos 10/21/2019  . History of kidney stones   . History of smoking 25-50 pack years- quit age 69, smoked 2ppd from 70 yo-70 yo. 10/21/2019  . History of surgery- pt has several Sx- no personal issues wiht anesthesia  10/21/2019  . Hypertension   . Irritant contact dermatitis 08/25/2015  . Low vitamin D level   . Nausea 03/01/2016  . Neuropathy   . Osteopenia   . Osteoporosis   . Parkinson's disease ([place])   . Pneumonia   . Portal hypertension ([place])   . Severe episode of recurrent major depressive  disorder, without psychotic features ([place]) 05/15/2016  . Severe obesity (BMI >= 40) ([place]) 05/15/2016  . Sleep disturbance 10/21/2019  . Splenomegaly a  . Stage 3 chronic kidney disease ([place]) 10/21/2019  . Thrombocytopenia ([place]) 09/03/2017  . Tremor of both hands 05/15/2016  . Vitamin D deficiency 11/05/2017    Past Surgical History:  Procedure Laterality Date  . ABDOMINAL HYSTERECTOMY    . APPENDECTOMY    . BACK SURGERY     thoracic-fractured   . BACK SURGERY     lumbar - fractured  . BREAST LUMPECTOMY WITH RADIOACTIVE SEED LOCALIZATION Right 08/01/2018   Procedure: RADIOACTIVE SEED GUIDED RIGHT BREAST LUMPECTOMY;  Surgeon: Coralie Keens, MD;  Location: [facility];  Service:  General;  Laterality: Right;  . ESOPHAGOGASTRODUODENOSCOPY  05/20/2008   Mild to moderate esophagitis. Otherwise normal EGD.   Marland Kitchen EYE SURGERY Left    cataract surgery with lens implant  . GASTROSTOMY     peptic ulcer - peg tube placement    Current Medications: Current Meds  Medication Sig  . acetaminophen (TYLENOL) 650 MG CR tablet Take 650 mg by mouth every 6 (six) hours as needed for pain.  Marland Kitchen atorvastatin (LIPITOR) 10 MG tablet Take 1 tablet (10 mg total) by mouth daily.  . bisacodyl (DULCOLAX) 5 MG EC tablet Take 15 mg by mouth at bedtime as needed for moderate constipation.  . clopidogrel (PLAVIX) 75 MG tablet TAKE 1 TABLET BY MOUTH DAILY. (Patient taking differently: Take 75 mg by mouth once.)  . cyclobenzaprine (FLEXERIL) 10 MG tablet TAKE 1 TABLET BY MOUTH EVERY 8 HOURS AS NEEDED (Patient taking differently: Take 10 mg by mouth as needed for muscle spasms (q 6hrs).)  . diphenhydrAMINE (BENADRYL) 25 MG tablet Take 25 mg by mouth every 6 (six) hours as needed for allergies.  Marland Kitchen eszopiclone (LUNESTA) 1 MG TABS tablet Take 1 tablet (1 mg total) by mouth at bedtime as needed for sleep. Take immediately before bedtime  . furosemide (LASIX) 40 MG tablet Take 1 tablet (40 mg total) by mouth daily.  Marland Kitchen HYDROcodone-acetaminophen (NORCO/VICODIN) 5-325 MG tablet TAKE 1 TABLET BY MOUTH EVERY 4 HOURS AS NEEDED FOR UP TO 5 DAYS FOR MODERATE PAIN. (Patient taking differently: Take 1 tablet by mouth every 4 (four) hours as needed for moderate pain or severe pain. TAKE 1 TABLET BY MOUTH EVERY 4 HOURS AS NEEDED FOR UP TO 5 DAYS FOR MODERATE PAIN.)  . losartan (COZAAR) 50 MG tablet Take 1 tablet (50 mg total) by mouth daily.  . nitroGLYCERIN (NITROSTAT) 0.4 MG SL tablet Place 0.4 mg under the tongue every 5 (five) minutes as needed for chest pain.   . pantoprazole (PROTONIX) 40 MG tablet Take 1 tablet (40 mg total) by mouth daily.  . potassium chloride (KLOR-CON) 10 MEQ tablet TAKE 1 TABLET (10 MEQ TOTAL)  BY MOUTH DAILY.  Marland Kitchen pregabalin (LYRICA) 50 MG capsule TAKE 1 CAPSULE BY MOUTH 3 TIMES DAILY. (Patient taking differently: Take 50 mg by mouth 3 (three) times daily.)  . promethazine (PHENERGAN) 25 MG tablet TAKE 1 TABLET BY MOUTH 4 TIMES DAILY AS NEEDED FOR NAUSEA OR VOMITING. (Patient taking differently: Take 25 mg by mouth as needed for nausea or vomiting. TAKE 1 TABLET BY MOUTH 4 TIMES DAILY AS NEEDED FOR NAUSEA OR VOMITING.)  . ranolazine (RANEXA) 1000 MG SR tablet TAKE 1 TABLET BY MOUTH 2 TIMES DAILY. (Patient taking differently: Take 1,000 mg by mouth daily.)  . sertraline (ZOLOFT) 100 MG tablet Take 1.5 tablet (  150 mg) by mouth daily. (Patient taking differently: Take 150 mg by mouth daily. Take 1.5 tablet (150 mg) by mouth daily.)  . [DISCONTINUED] citalopram (CELEXA) 10 MG tablet Take 1 tablet by mouth daily.     Allergies:   Iodinated diagnostic agents, Nsaids, Latex, Sinemet [carbidopa w-levodopa], Inderal [propranolol], Indomethacin, Penicillin g, Pneumococcal vaccines, Scopolamine, and Tape   Social History   Socioeconomic History  . Marital status: Married    Spouse name: Not on file  . Number of children: 3  . Years of education: Not on file  . Highest education level: Not on file  Occupational History  . Not on file  Tobacco Use  . Smoking status: Former Smoker    Packs/day: 1.00    Years: 22.00    Pack years: 22.00    Types: Cigarettes    Quit date: 06/26/1986    Years since quitting: 34.2  . Smokeless tobacco: Never Used  Vaping Use  . Vaping Use: Never used  Substance and Sexual Activity  . Alcohol use: No  . Drug use: No  . Sexual activity: Not Currently    Birth control/protection: None  Other Topics Concern  . Not on file  Social History Narrative  . Not on file   Social Determinants of Health   Financial Resource Strain: Not on file  Food Insecurity: Not on file  Transportation Needs: Not on file  Physical Activity: Not on file  Stress: Not on file   Social Connections: Not on file     Family History: The patient's family history includes ALS in her sister; Alzheimer's disease in her father and mother; Arthritis in her mother and sister; Breast cancer in her maternal aunt, maternal aunt, and maternal aunt; Cancer in her father, maternal aunt, maternal uncle, and mother; Colon cancer in her maternal uncle and maternal uncle; Heart attack in her paternal aunt, paternal grandfather, and paternal uncle; Huntington's disease in her daughter; Hyperlipidemia in her mother; Liver disease in her maternal uncle; Pancreatic cancer in her maternal uncle; Parkinson's disease in her father; Stomach cancer in her paternal aunt; Stroke in her paternal aunt and paternal grandmother. There is no history of Esophageal cancer or Rectal cancer. ROS:   Please see the history of present illness.    All 14 point review of systems negative except as described per history of present illness  EKGs/Labs/Other Studies Reviewed:      Recent Labs: 04/02/2020: ALT 11 07/05/2020: BUN 13; Creatinine, Ser 0.75; NT-Pro BNP 58; Potassium 3.7; Sodium 139  Recent Lipid Panel    Component Value Date/Time   CHOL 170 04/02/2020 0911   TRIG 71 04/02/2020 0911   HDL 57 04/02/2020 0911   CHOLHDL 3.0 04/02/2020 0911   LDLCALC 99 04/02/2020 0911    Physical Exam:    VS:  BP 126/62 (BP Location: Right Arm, Patient Position: Sitting)   Pulse 82   Ht 5' (1.524 m)   Wt 160 lb (72.6 kg)   SpO2 96%   BMI 31.25 kg/m     Wt Readings from Last 3 Encounters:  10/01/20 160 lb (72.6 kg)  09/16/20 145 lb (65.8 kg)  08/26/20 145 lb (65.8 kg)     GEN:  Well nourished, well developed in no acute distress HEENT: Normal NECK: No JVD; No carotid bruits LYMPHATICS: No lymphadenopathy CARDIAC: RRR, no murmurs, no rubs, no gallops RESPIRATORY:  Clear to auscultation without rales, wheezing or rhonchi  ABDOMEN: Soft, non-tender, non-distended MUSCULOSKELETAL:  No edema; No deformity  SKIN: Warm and dry LOWER EXTREMITIES: no swelling NEUROLOGIC:  Alert and oriented x 3 PSYCHIATRIC:  Normal affect   ASSESSMENT:    1. Coronary artery disease involving native coronary artery of native heart without angina pectoris   2. Angina pectoris ([place])   3. Primary hypertension   4. Portal hypertension (HCC)    PLAN:    In order of problems listed above:  1. Coronary disease still having some pain we debated long time what to do in the situation I recommended to try long-acting nitroglycerin.  We will put her on Imdur 30 mg daily and see how she responds to this therapy we may be forced to pursue cardiac catheterization if she does not respond well to medical therapy in spite of the fact that coronary CT angio did not show critical lesions.  I am also concerned that her pain may not be related to her heart rate may be related to her cirrhosis of the liver that she has. 2. Essential hypertension, blood pressure well controlled continue present medications. 3. Dyslipidemia, she is taking Lipitor 10 which I will continue, ideally did review K PL from October of last year showing LDL of 99 HDL 57.  We will make arrangements for cholesterol to be rechecked. 4. Portal hypertension that being managed by GI team. 5. Overall confusing situation.  So far cardiac testing showed preserved ejection fraction, coronary CT angio did not grow critical lesion.  But she still having some symptoms that are concerning for coronary artery disease.  Plan as outlined above   Medication Adjustments/Labs and Tests Ordered: Current medicines are reviewed at length with the patient today.  Concerns regarding medicines are outlined above.  No orders of the defined types were placed in this encounter.  Medication changes: No orders of the defined types were placed in this encounter.   Signed, Park Liter, MD, [facility] 10/01/2020 2:35 PM    [facility]

## 2020-10-01 NOTE — Addendum Note (Signed)
Addended by: Senaida Ores on: 10/01/2020 02:48 PM   Modules accepted: Orders

## 2020-10-01 NOTE — Addendum Note (Signed)
Addended by: Senaida Ores on: 10/01/2020 02:42 PM   Modules accepted: Orders

## 2020-10-01 NOTE — Patient Instructions (Signed)
Medication Instructions:  Your physician has recommended you make the following change in your medication:   START: imdur 30 mg daily  *If you need a refill on your cardiac medications before your next appointment, please call your pharmacy*   Lab Work: None If you have labs (blood work) drawn today and your tests are completely normal, you will receive your results only by: Marland Kitchen MyChart Message (if you have MyChart) OR . A paper copy in the mail If you have any lab test that is abnormal or we need to change your treatment, we will call you to review the results.   Testing/Procedures:  None   Follow-Up: At [facility], you and your health needs are our priority.  As part of our continuing mission to provide you with exceptional heart care, we have created designated Provider Care Teams.  These Care Teams include your primary Cardiologist (physician) and Advanced Practice Providers (APPs -  Physician Assistants and Nurse Practitioners) who all work together to provide you with the care you need, when you need it.  We recommend signing up for the patient portal called "MyChart".  Sign up information is provided on this After Visit Summary.  MyChart is used to connect with patients for Virtual Visits (Telemedicine).  Patients are able to view lab/test results, encounter notes, upcoming appointments, etc.  Non-urgent messages can be sent to your provider as well.   To learn more about what you can do with MyChart, go to [email or web address].    Your next appointment:   1 month(s)  The format for your next appointment:   In Person  Provider:   Jenne Campus, MD   Other Instructions  Isosorbide Mononitrate extended-release tablets What is this medicine? ISOSORBIDE MONONITRATE (eye soe SOR bide mon oh NYE trate) is a vasodilator. It relaxes blood vessels, increasing the blood and oxygen supply to your heart. This medicine is used to prevent chest pain caused by angina. It will  not help to stop an episode of chest pain. This medicine may be used for other purposes; ask your health care provider or pharmacist if you have questions. COMMON BRAND NAME(S): Imdur, Isotrate ER What should I tell my health care provider before I take this medicine? They need to know if you have any of these conditions:  previous heart attack or heart failure  an unusual or allergic reaction to isosorbide mononitrate, nitrates, other medicines, foods, dyes, or preservatives  pregnant or trying to get pregnant  breast-feeding How should I use this medicine? Take this medicine by mouth with a glass of water. Follow the directions on the prescription label. Do not crush or chew. Take your medicine at regular intervals. Do not take your medicine more often than directed. Do not stop taking this medicine except on the advice of your doctor or health care professional. Talk to your pediatrician regarding the use of this medicine in children. Special care may be needed. Overdosage: If you think you have taken too much of this medicine contact a poison control center or emergency room at once. NOTE: This medicine is only for you. Do not share this medicine with others. What if I miss a dose? If you miss a dose, take it as soon as you can. If it is almost time for your next dose, take only that dose. Do not take double or extra doses. What may interact with this medicine? Do not take this medicine with any of the following medications:  medicines used to treat erectile  dysfunction (ED) like avanafil, sildenafil, tadalafil, and vardenafil  riociguat This medicine may also interact with the following medications:  medicines for high blood pressure  other medicines for angina or heart failure This list may not describe all possible interactions. Give your health care provider a list of all the medicines, herbs, non-prescription drugs, or dietary supplements you use. Also tell them if you smoke,  drink alcohol, or use illegal drugs. Some items may interact with your medicine. What should I watch for while using this medicine? Check your heart rate and blood pressure regularly while you are taking this medicine. Ask your doctor or health care professional what your heart rate and blood pressure should be and when you should contact him or her. Tell your doctor or health care professional if you feel your medicine is no longer working. You may get dizzy. Do not drive, use machinery, or do anything that needs mental alertness until you know how this medicine affects you. To reduce the risk of dizzy or fainting spells, do not sit or stand up quickly, especially if you are an older patient. Alcohol can make you more dizzy, and increase flushing and rapid heartbeats. Avoid alcoholic drinks. Do not treat yourself for coughs, colds, or pain while you are taking this medicine without asking your doctor or health care professional for advice. Some ingredients may increase your blood pressure. What side effects may I notice from receiving this medicine? Side effects that you should report to your doctor or health care professional as soon as possible:  bluish discoloration of lips, fingernails, or palms of hands  irregular heartbeat, palpitations  low blood pressure  nausea, vomiting  persistent headache  unusually weak or tired Side effects that usually do not require medical attention (report to your doctor or health care professional if they continue or are bothersome):  flushing of the face or neck  rash This list may not describe all possible side effects. Call your doctor for medical advice about side effects. You may report side effects to FDA at 1-800-FDA-1088. Where should I keep my medicine? Keep out of the reach of children. Store between 15 and 30 degrees C (59 and 86 degrees F). Keep container tightly closed. Throw away any unused medicine after the expiration date. NOTE: This  sheet is a summary. It may not cover all possible information. If you have questions about this medicine, talk to your doctor, pharmacist, or health care provider.  2021 Elsevier/Gold Standard (2013-04-11 14:48:19)

## 2020-10-02 LAB — BASIC METABOLIC PANEL
BUN/Creatinine Ratio: 15 (ref 12–28)
BUN: 9 mg/dL (ref 8–27)
CO2: 26 mmol/L (ref 20–29)
Calcium: 8.9 mg/dL (ref 8.7–10.3)
Chloride: 103 mmol/L (ref 96–106)
Creatinine, Ser: 0.59 mg/dL (ref 0.57–1.00)
Glucose: 87 mg/dL (ref 65–99)
Potassium: 3.9 mmol/L (ref 3.5–5.2)
Sodium: 143 mmol/L (ref 134–144)
eGFR: 96 mL/min/{1.73_m2} (ref >59)

## 2020-10-05 ENCOUNTER — Other Ambulatory Visit: Payer: Self-pay

## 2020-10-05 ENCOUNTER — Encounter: Payer: Self-pay | Admitting: Physician Assistant

## 2020-10-05 ENCOUNTER — Ambulatory Visit (INDEPENDENT_AMBULATORY_CARE_PROVIDER_SITE_OTHER): Payer: Medicare Other | Admitting: Physician Assistant

## 2020-10-05 VITALS — BP 107/61 | HR 93 | Temp 99.5°F | Ht 60.0 in | Wt 159.8 lb

## 2020-10-05 DIAGNOSIS — R6884 Jaw pain: Secondary | ICD-10-CM | POA: Diagnosis not present

## 2020-10-05 DIAGNOSIS — F419 Anxiety disorder, unspecified: Secondary | ICD-10-CM

## 2020-10-05 DIAGNOSIS — F32A Depression, unspecified: Secondary | ICD-10-CM

## 2020-10-05 DIAGNOSIS — G4700 Insomnia, unspecified: Secondary | ICD-10-CM | POA: Diagnosis not present

## 2020-10-05 DIAGNOSIS — R220 Localized swelling, mass and lump, head: Secondary | ICD-10-CM | POA: Diagnosis not present

## 2020-10-05 MED ORDER — CLINDAMYCIN HCL 300 MG PO CAPS: 300.0000 mg | ORAL_CAPSULE | Freq: Three times a day (TID) | ORAL | 0 refills | Status: DC

## 2020-10-05 MED ORDER — SERTRALINE HCL 25 MG PO TABS: ORAL_TABLET | ORAL | 0 refills | Status: DC

## 2020-10-05 MED ORDER — MIRTAZAPINE 7.5 MG PO TABS: 7.5000 mg | ORAL_TABLET | Freq: Every day | ORAL | 0 refills | Status: DC

## 2020-10-05 NOTE — Progress Notes (Signed)
Established Patient Office Visit  Subjective:  Patient ID: Amy Moon, female    DOB: 12/06/1950  Age: 70 y.o. MRN: [id number]  CC:  Chief Complaint  Patient presents with  . Insomnia    HPI [name] presents for follow up on insomnia.   Insomnia: Patient continues to have trouble with falling asleep.  Reports sleeping medication Lunesta helps with falling asleep and gets about 3 to 5 hours of sleep versus 2 to 3 hours without medication.  Patient unsure if she snores and denies daytime sleepiness.  Reports has 3 sundrop drinks per day.  Mood: Reports is taking sertraline 150 mg and has not noticed a significant difference with her mood, and continues to feel down and things are getting on her nerves.  Denies SI/HI.  Towards the end of the visit patient complains of still having jaw pain and swelling. Reports symptoms are more prominent on the left side. States the pain spreads to her forehead and to the other side. Hurts to chew. States has intermittent fevers. States saw her dentist and does not feel it is TMJ related. Patient expresses concern of underlying infection.   Past Medical History:  Diagnosis Date  . Allergy   . Anemia   . Angina pectoris ([place])   . Arthritis   . Asthma    years ago  . Bronchitis 06/03/2018  . Cancer (HCC)    squamous cell carcinoma, nose  . Cataract   . Chest pain 07/30/2017  . Chronic kidney disease    possibly per pt  . Chronic pain syndrome 07/30/2017  . Cirrhosis ([place])   . Clotting disorder (HCC)    platelets are low  . Coronary artery disease 04/02/2020  . Depression   . Drug-induced low platelet count   . Duodenal ulcer   . Dyslipidemia 04/02/2020  . Dysphagia 09/03/2017  . Dyspnea on exertion 10/17/2019  . Encounter for hepatitis C virus screening test for high risk patient 11/05/2017  . Encounter for Medicare annual wellness exam 05/09/2018  . Enlarged thyroid   . Family history of adverse reaction to anesthesia    father had  difficulty waking up and breathing on his own after surgery  . GERD (gastroesophageal reflux disease)   . Granulomatous disease ([place])   . Healthcare maintenance 07/30/2017  . Hepatic disease   . Hiatal hernia   . History of exposure to asbestos 10/21/2019  . History of kidney stones   . History of smoking 25-50 pack years- quit age 56, smoked 2ppd from 70 yo-70 yo. 10/21/2019  . History of surgery- pt has several Sx- no personal issues wiht anesthesia  10/21/2019  . Hypertension   . Irritant contact dermatitis 08/25/2015  . Low vitamin D level   . Nausea 03/01/2016  . Neuropathy   . Osteopenia   . Osteoporosis   . Parkinson's disease ([place])   . Pneumonia   . Portal hypertension ([place])   . Severe episode of recurrent major depressive disorder, without psychotic features ([place]) 05/15/2016  . Severe obesity (BMI >= 40) ([place]) 05/15/2016  . Sleep disturbance 10/21/2019  . Splenomegaly a  . Stage 3 chronic kidney disease ([place]) 10/21/2019  . Thrombocytopenia ([place]) 09/03/2017  . Tremor of both hands 05/15/2016  . Vitamin D deficiency 11/05/2017    Past Surgical History:  Procedure Laterality Date  . ABDOMINAL HYSTERECTOMY    . APPENDECTOMY    . BACK SURGERY     thoracic-fractured   . BACK SURGERY  lumbar - fractured  . BREAST LUMPECTOMY WITH RADIOACTIVE SEED LOCALIZATION Right 08/01/2018   Procedure: RADIOACTIVE SEED GUIDED RIGHT BREAST LUMPECTOMY;  Surgeon: Coralie Keens, MD;  Location: [facility];  Service: General;  Laterality: Right;  . ESOPHAGOGASTRODUODENOSCOPY  05/20/2008   Mild to moderate esophagitis. Otherwise normal EGD.   Marland Kitchen EYE SURGERY Left    cataract surgery with lens implant  . GASTROSTOMY     peptic ulcer - peg tube placement    Family History  Problem Relation Age of Onset  . Cancer Mother        ovarian  . Hyperlipidemia Mother   . Alzheimer's disease Mother   . Arthritis Mother        rheumatoid  . Cancer Father        lung  . Parkinson's disease Father   .  Alzheimer's disease Father   . ALS Sister   . Cancer Maternal Aunt        pancreatic, lung, liver,breast  . Breast cancer Maternal Aunt   . Cancer Maternal Uncle        pancreatic, liver, brain  . Colon cancer Maternal Uncle   . Heart attack Paternal Aunt   . Stroke Paternal Aunt   . Stomach cancer Paternal Aunt   . Heart attack Paternal Uncle   . Stroke Paternal Grandmother   . Heart attack Paternal Grandfather   . Arthritis Sister        rheumatoid  . Huntington's disease Daughter        presumed inherited from father per patient  . Colon cancer Maternal Uncle   . Pancreatic cancer Maternal Uncle   . Liver disease Maternal Uncle   . Breast cancer Maternal Aunt   . Breast cancer Maternal Aunt   . Esophageal cancer Neg Hx   . Rectal cancer Neg Hx     Social History   Socioeconomic History  . Marital status: Married    Spouse name: Not on file  . Number of children: 3  . Years of education: Not on file  . Highest education level: Not on file  Occupational History  . Not on file  Tobacco Use  . Smoking status: Former Smoker    Packs/day: 1.00    Years: 22.00    Pack years: 22.00    Types: Cigarettes    Quit date: 06/26/1986    Years since quitting: 34.3  . Smokeless tobacco: Never Used  Vaping Use  . Vaping Use: Never used  Substance and Sexual Activity  . Alcohol use: No  . Drug use: No  . Sexual activity: Not Currently    Birth control/protection: None  Other Topics Concern  . Not on file  Social History Narrative  . Not on file   Social Determinants of Health   Financial Resource Strain: Not on file  Food Insecurity: Not on file  Transportation Needs: Not on file  Physical Activity: Not on file  Stress: Not on file  Social Connections: Not on file  Intimate Partner Violence: Not on file    Outpatient Medications Prior to Visit  Medication Sig Dispense Refill  . acetaminophen (TYLENOL) 650 MG CR tablet Take 650 mg by mouth every 6 (six) hours as  needed for pain.    Marland Kitchen atorvastatin (LIPITOR) 10 MG tablet Take 1 tablet (10 mg total) by mouth daily. 90 tablet 1  . bisacodyl (DULCOLAX) 5 MG EC tablet Take 15 mg by mouth at bedtime as needed for moderate constipation.    Marland Kitchen  clopidogrel (PLAVIX) 75 MG tablet TAKE 1 TABLET BY MOUTH DAILY. (Patient taking differently: Take 75 mg by mouth once.) 90 tablet 2  . cyclobenzaprine (FLEXERIL) 10 MG tablet TAKE 1 TABLET BY MOUTH EVERY 8 HOURS AS NEEDED (Patient taking differently: Take 10 mg by mouth as needed for muscle spasms (q 6hrs).) 30 tablet 0  . diphenhydrAMINE (BENADRYL) 25 MG tablet Take 25 mg by mouth every 6 (six) hours as needed for allergies.    Marland Kitchen eszopiclone (LUNESTA) 1 MG TABS tablet Take 1 tablet (1 mg total) by mouth at bedtime as needed for sleep. Take immediately before bedtime 30 tablet 1  . furosemide (LASIX) 40 MG tablet Take 1 tablet (40 mg total) by mouth daily. 90 tablet 1  . HYDROcodone-acetaminophen (NORCO/VICODIN) 5-325 MG tablet TAKE 1 TABLET BY MOUTH EVERY 4 HOURS AS NEEDED FOR UP TO 5 DAYS FOR MODERATE PAIN. (Patient taking differently: Take 1 tablet by mouth every 4 (four) hours as needed for moderate pain or severe pain. TAKE 1 TABLET BY MOUTH EVERY 4 HOURS AS NEEDED FOR UP TO 5 DAYS FOR MODERATE PAIN.) 40 tablet 0  . isosorbide mononitrate (IMDUR) 30 MG 24 hr tablet Take 1 tablet (30 mg total) by mouth daily. 90 tablet 1  . losartan (COZAAR) 50 MG tablet Take 1 tablet (50 mg total) by mouth daily. 90 tablet 3  . nitroGLYCERIN (NITROSTAT) 0.4 MG SL tablet Place 0.4 mg under the tongue every 5 (five) minutes as needed for chest pain.     . pantoprazole (PROTONIX) 40 MG tablet Take 1 tablet (40 mg total) by mouth daily. 30 tablet 6  . potassium chloride (KLOR-CON) 10 MEQ tablet TAKE 1 TABLET (10 MEQ TOTAL) BY MOUTH DAILY. 90 tablet 1  . pregabalin (LYRICA) 50 MG capsule TAKE 1 CAPSULE BY MOUTH 3 TIMES DAILY. (Patient taking differently: Take 50 mg by mouth 3 (three) times daily.)  90 capsule 0  . promethazine (PHENERGAN) 25 MG tablet TAKE 1 TABLET BY MOUTH 4 TIMES DAILY AS NEEDED FOR NAUSEA OR VOMITING. (Patient taking differently: Take 25 mg by mouth as needed for nausea or vomiting. TAKE 1 TABLET BY MOUTH 4 TIMES DAILY AS NEEDED FOR NAUSEA OR VOMITING.) 45 tablet 0  . ranolazine (RANEXA) 1000 MG SR tablet TAKE 1 TABLET BY MOUTH 2 TIMES DAILY. (Patient taking differently: Take 1,000 mg by mouth daily.) 60 tablet 11  . sertraline (ZOLOFT) 100 MG tablet Take 1.5 tablet (150 mg) by mouth daily. (Patient taking differently: Take 150 mg by mouth daily. Take 1.5 tablet (150 mg) by mouth daily.) 45 tablet 1   No facility-administered medications prior to visit.    Allergies  Allergen Reactions  . Iodinated Diagnostic Agents Hives  . Nsaids Other (See Comments)    History of bleeding ulcers  . Latex Hives and Rash  . Sinemet [Carbidopa W-Levodopa] Nausea And Vomiting  . Inderal [Propranolol] Other (See Comments)    Unknown  . Indomethacin Other (See Comments)    Unknown  . Penicillin G Other (See Comments)    Unknown  . Pneumococcal Vaccines Other (See Comments)    Caused "pneumonia"  . Scopolamine Other (See Comments)    The patch, caused her to pass out / change in mental status  . Tape Other (See Comments)    Unknown    ROS Review of Systems A fourteen system review of systems was performed and found to be positive as per HPI.   Objective:    Physical Exam  General:  Well Developed, well nourished, appropriate for stated age.  Neuro:  Alert and oriented,  extra-ocular muscles intact  HEENT:  Normocephalic, atraumatic, tenderness to palpation of left facial side and behind right ear, normal TM of both ears, no adenopathy appreciated, neck supple, mild swelling of mandibular body on left side when compared to the right Skin:  no gross rash, warm, pink. Cardiac:  RRR, S1 S2 Respiratory:  ECTA B/L, Not using accessory muscles, speaking in full sentences-  unlabored. Vascular:  Ext warm, no cyanosis apprec.; cap RF less 2 sec. Psych:  No HI/SI, judgement and insight good, Euthymic mood. Full Affect.  BP 107/61   Pulse 93   Temp 99.5 F (37.5 C)   Ht 5' (1.524 m)   Wt 159 lb 12.8 oz (72.5 kg)   SpO2 96%   BMI 31.21 kg/m  Wt Readings from Last 3 Encounters:  10/05/20 159 lb 12.8 oz (72.5 kg)  10/01/20 160 lb (72.6 kg)  09/16/20 145 lb (65.8 kg)     Health Maintenance Due  Topic Date Due  . COVID-19 Vaccine (1) Never done  . TETANUS/TDAP  Never done  . MAMMOGRAM  06/25/2020    There are no preventive care reminders to display for this patient.  Lab Results  Component Value Date   TSH 0.254 (L) 06/18/2019   Lab Results  Component Value Date   WBC 4.9 09/17/2019   HGB 13.0 09/17/2019   HCT 38.2 09/17/2019   MCV 95.4 09/17/2019   PLT 137.0 (L) 09/17/2019   Lab Results  Component Value Date   NA 143 10/01/2020   K 3.9 10/01/2020   CO2 26 10/01/2020   GLUCOSE 87 10/01/2020   BUN 9 10/01/2020   CREATININE 0.59 10/01/2020   BILITOT 0.7 04/02/2020   ALKPHOS 80 04/02/2020   AST 13 04/02/2020   ALT 11 04/02/2020   PROT 7.0 04/02/2020   ALBUMIN 4.9 (H) 04/02/2020   CALCIUM 8.9 10/01/2020   ANIONGAP 8 07/25/2018   GFR 86.72 09/17/2019   Lab Results  Component Value Date   CHOL 170 04/02/2020   Lab Results  Component Value Date   HDL 57 04/02/2020   Lab Results  Component Value Date   LDLCALC 99 04/02/2020   Lab Results  Component Value Date   TRIG 71 04/02/2020   Lab Results  Component Value Date   CHOLHDL 3.0 04/02/2020   Lab Results  Component Value Date   HGBA1C 4.8 06/18/2019      Assessment & Plan:   Problem List Items Addressed This Visit   None   Visit Diagnoses    Insomnia, unspecified type    -  Primary   Relevant Medications   mirtazapine (REMERON) 7.5 MG tablet   Anxiety and depression       Relevant Medications   sertraline (ZOLOFT) 25 MG tablet   mirtazapine (REMERON) 7.5 MG  tablet   Jaw swelling       Relevant Medications   clindamycin (CLEOCIN) 300 MG capsule   Jaw pain, non-TMJ       Relevant Medications   clindamycin (CLEOCIN) 300 MG capsule     Insomnia: -Discussed with patient alternative treatment options and is agreeable to trial mirtazapine for mood and sleep. -Recommend to reduce caffeine. -If symptoms fail to improve or worsen recommend to consider referral to sleep clinic.  Anxiety and depression: -PHQ-9 score of 10. -Will discontinue sertraline and start mirtazapine for mood and insomnia. Discussed with patient will  taper sertraline and should take as instructed on rx and then start new medication. -Advised to let me know if unable to tolerate mirtazapine.  Jaw pain, non-TMJ, jaw swelling: -Discussed with patient etiology unclear and potentially symptoms could be related to trigeminal neuralgia which are atypical (symptoms are usually unilateral w/o swelling). Will treat empirically for potentially underlying infection with clindamycin x 3 days. If symptoms fail to improve recommend referral to Neurology and/ or imaging studies and labs.  Meds ordered this encounter  Medications  . sertraline (ZOLOFT) 25 MG tablet    Sig: Take 5 tablets by mouth daily x 7 days. Take 4 tablets by mouth daily x 7 days. Take 3 tablets by mouth daily x 7 days. Take 2 tablets by mouth daily x 7 days. Take 1 tablet by mouth daily x 7 days.    Dispense:  105 tablet    Refill:  0    Order Specific Question:   Supervising Provider    Answer:   Beatrice Lecher D [[id number]]  . mirtazapine (REMERON) 7.5 MG tablet    Sig: Take 1 tablet (7.5 mg total) by mouth at bedtime. Start medication once weaned off sertraline (Zoloft).    Dispense:  90 tablet    Refill:  0    Order Specific Question:   Supervising Provider    Answer:   Beatrice Lecher D [[id number]]  . clindamycin (CLEOCIN) 300 MG capsule    Sig: Take 1 capsule (300 mg total) by mouth 3 (three) times daily.     Dispense:  9 capsule    Refill:  0    Order Specific Question:   Supervising Provider    Answer:   Beatrice Lecher D [[id number]]    Follow-up: Return in about 3 months (around 01/04/2021) for Insomnia, Mood- changed med.    Lorrene Reid, PA-C

## 2020-10-05 NOTE — Patient Instructions (Signed)
Start Mirtazapine once you have weaned off and completed the Zoloft.

## 2020-10-07 ENCOUNTER — Other Ambulatory Visit: Payer: Self-pay | Admitting: Specialist

## 2020-10-11 ENCOUNTER — Other Ambulatory Visit: Payer: Self-pay | Admitting: Specialist

## 2020-10-12 ENCOUNTER — Other Ambulatory Visit: Payer: Self-pay | Admitting: Specialist

## 2020-10-19 ENCOUNTER — Other Ambulatory Visit: Payer: Self-pay | Admitting: Specialist

## 2020-10-20 ENCOUNTER — Telehealth: Payer: Self-pay | Admitting: Cardiology

## 2020-10-20 DIAGNOSIS — Z79899 Other long term (current) drug therapy: Secondary | ICD-10-CM

## 2020-10-20 NOTE — Telephone Encounter (Signed)
Pt c/o medication issue:  1. Name of Medication: furosemide (LASIX) 40 MG tablet  2. How are you currently taking this medication (dosage and times per day)? 1 tablet daily  3. Are you having a reaction (difficulty breathing--STAT)? no  4. What is your medication issue? Patient would like to increase medication dose. She states she discussed it at her last appointment. She states the 40 mg helps, but she still always has the swelling in her legs and has congestion. She states if the line is busy to keep trying if it is not an inconvenience.

## 2020-10-21 NOTE — Telephone Encounter (Signed)
Left message for patient to return call.

## 2020-10-22 NOTE — Telephone Encounter (Signed)
Pt is returning call.  

## 2020-10-22 NOTE — Telephone Encounter (Signed)
Called patient informed her she needs labs to decide about lasix. Also, Patient complaining of headaches since taking imdur, she has had this issue in the past she wants to know if Dr. Agustin Cree is ok with her stopping.

## 2020-10-22 NOTE — Telephone Encounter (Signed)
Called patient informed her that Dr. Agustin Cree said she can stop imdur if her headaches persistent. She understood. No further questions.

## 2020-10-25 ENCOUNTER — Other Ambulatory Visit: Payer: Self-pay | Admitting: Specialist

## 2020-10-28 ENCOUNTER — Other Ambulatory Visit: Payer: Self-pay | Admitting: Gastroenterology

## 2020-10-28 ENCOUNTER — Other Ambulatory Visit: Payer: Self-pay | Admitting: Specialist

## 2020-11-02 ENCOUNTER — Ambulatory Visit: Payer: Medicare Other | Admitting: Cardiology

## 2020-11-08 ENCOUNTER — Other Ambulatory Visit: Payer: Self-pay | Admitting: Specialist

## 2020-11-12 ENCOUNTER — Ambulatory Visit: Payer: Medicare Other | Admitting: Specialist

## 2020-11-16 ENCOUNTER — Other Ambulatory Visit: Payer: Self-pay | Admitting: Specialist

## 2020-11-19 ENCOUNTER — Other Ambulatory Visit: Payer: Self-pay | Admitting: Specialist

## 2020-11-19 ENCOUNTER — Ambulatory Visit: Payer: Self-pay

## 2020-11-19 ENCOUNTER — Other Ambulatory Visit: Payer: Self-pay

## 2020-11-19 ENCOUNTER — Ambulatory Visit: Payer: Medicare Other | Admitting: Surgery

## 2020-11-19 DIAGNOSIS — M1711 Unilateral primary osteoarthritis, right knee: Secondary | ICD-10-CM | POA: Diagnosis not present

## 2020-11-19 DIAGNOSIS — M1712 Unilateral primary osteoarthritis, left knee: Secondary | ICD-10-CM

## 2020-11-19 NOTE — Telephone Encounter (Signed)
Please advise 

## 2020-11-19 NOTE — Progress Notes (Signed)
Office Visit Note   Patient: Amy Moon           Date of Birth: 1951-02-05           MRN: [id number] Visit Date: 11/19/2020              Requested by: Lorrene Reid, PA-C [address] [place],  [place] [address] PCP: Lorrene Reid, PA-C   Assessment & Plan: Visit Diagnoses:  1. Unilateral primary osteoarthritis, left knee   2. Unilateral primary osteoarthritis, right knee   End-stage DJD left knee  Plan: Reviewed x-rays with patient today.  She does not have an acute injury.  She has bone-on-bone changes medial compartment left knee and I think that when she fell she aggravated this.  Advised patient that best treatment option would be left total knee replacement.  Anything short of this would not be of any great benefit to her.  Dr. Louanne Skye had recommended the patient see Dr. Ninfa Linden in his March 2022 note but she has not seen him yet.  I will schedule appointment with him next week and he can discuss treatment options.  Follow-Up Instructions: Return in about 1 year (around 11/19/2021) for with dr blackman to discuss possible scheduling of left total knee replacement.   Orders:  Orders Placed This Encounter  Procedures  . XR KNEE 3 VIEW LEFT  . XR KNEE 3 VIEW RIGHT   No orders of the defined types were placed in this encounter.     Procedures: No procedures performed   Clinical Data: No additional findings.   Subjective: Chief Complaint  Patient presents with  . Right Knee - Pain, Injury    States that she fell out of truck landing on both knees  . Left Knee - Pain, Injury    States that she fell out of truck landing on both knees    HPI 70 year old white female history of left greater than right knee DJD comes in with complaints of knee pain.  Again states that left knee is worse than the right.  States that a few days ago she was getting out of a truck when she slipped and tried to catch her self she may have twisted her left knee.  She did not  hit her knees on the ground.  Left knee worsen right ankle.  She has had left knee cortisone injection in the past.  March 2022 Dr. Louanne Skye had recommended patient be seen by Dr. Ninfa Linden to discuss left total knee replacement but has not done so yet. Review of Systems Not complaining of any cardiac pulmonary GI GU issues  Objective: Vital Signs: There were no vitals taken for this visit.  Physical Exam HENT:     Head: Normocephalic and atraumatic.  Eyes:     Extraocular Movements: Extraocular movements intact.  Pulmonary:     Effort: No respiratory distress.  Musculoskeletal:     Comments: Gait antalgic.  Right knee good ROM.  Mild joint line tenderness.   Left knee varus deformity.  Marked medial joint line tenderness.  Some swelling left knee with small effusion.  bilat calves nontender.   Neurological:     Mental Status: She is alert and oriented to person, place, and time.  Psychiatric:        Mood and Affect: Mood normal.     Ortho Exam  Specialty Comments:  No specialty comments available.  Imaging: No results found.   PMFS History: Patient Active Problem List  Diagnosis Date Noted  . Dyslipidemia 04/02/2020  . Splenomegaly   . Portal hypertension ([place])   . Pneumonia   . Osteoporosis   . Osteopenia   . Neuropathy   . Low vitamin D level   . History of kidney stones   . Hiatal hernia   . Hepatic disease   . Granulomatous disease ([place])   . GERD (gastroesophageal reflux disease)   . Enlarged thyroid   . Duodenal ulcer   . Drug-induced low platelet count   . Depression   . Clotting disorder ([place])   . Cirrhosis ([place])   . Chronic kidney disease   . Cataract   . Cancer ([place])   . Asthma   . Arthritis   . Angina pectoris ([place])   . Anemia   . Allergy   . Stage 3 chronic kidney disease ([place]) 10/21/2019  . History of smoking 25-50 pack years- quit age 61, smoked 2ppd from 70 yo-70 yo. 10/21/2019  . History of surgery- pt has several Sx- no personal issues  wiht anesthesia  10/21/2019  . Sleep disturbance 10/21/2019  . History of exposure to asbestos 10/21/2019  . Family history of adverse reaction to anesthesia   . Dyspnea on exertion 10/17/2019  . Encounter for Medicare annual wellness exam 05/09/2018  . Vitamin D deficiency 11/05/2017  . Encounter for hepatitis C virus screening test for high risk patient 11/05/2017  . Dysphagia 09/03/2017  . Thrombocytopenia ([place]) 09/03/2017  . Healthcare maintenance 07/30/2017  . Chronic pain syndrome 07/30/2017  . Chest pain 07/30/2017  . Parkinson's disease ([place]) 07/30/2017  . Hypertension 07/30/2017  . Severe episode of recurrent major depressive disorder, without psychotic features ([place]) 05/15/2016  . Tremor of both hands 05/15/2016  . Severe obesity (BMI >= 40) ([place]) 05/15/2016  . Nausea 03/01/2016  . Irritant contact dermatitis 08/25/2015   Past Medical History:  Diagnosis Date  . Allergy   . Anemia   . Angina pectoris ([place])   . Arthritis   . Asthma    years ago  . Bronchitis 06/03/2018  . Cancer (HCC)    squamous cell carcinoma, nose  . Cataract   . Chest pain 07/30/2017  . Chronic kidney disease    possibly per pt  . Chronic pain syndrome 07/30/2017  . Cirrhosis ([place])   . Clotting disorder (HCC)    platelets are low  . Coronary artery disease 04/02/2020  . Depression   . Drug-induced low platelet count   . Duodenal ulcer   . Dyslipidemia 04/02/2020  . Dysphagia 09/03/2017  . Dyspnea on exertion 10/17/2019  . Encounter for hepatitis C virus screening test for high risk patient 11/05/2017  . Encounter for Medicare annual wellness exam 05/09/2018  . Enlarged thyroid   . Family history of adverse reaction to anesthesia    father had difficulty waking up and breathing on his own after surgery  . GERD (gastroesophageal reflux disease)   . Granulomatous disease ([place])   . Healthcare maintenance 07/30/2017  . Hepatic disease   . Hiatal hernia   . History of exposure to asbestos 10/21/2019   . History of kidney stones   . History of smoking 25-50 pack years- quit age 75, smoked 2ppd from 70 yo-70 yo. 10/21/2019  . History of surgery- pt has several Sx- no personal issues wiht anesthesia  10/21/2019  . Hypertension   . Irritant contact dermatitis 08/25/2015  . Low vitamin D level   . Nausea 03/01/2016  . Neuropathy   .  Osteopenia   . Osteoporosis   . Parkinson's disease ([place])   . Pneumonia   . Portal hypertension ([place])   . Severe episode of recurrent major depressive disorder, without psychotic features ([place]) 05/15/2016  . Severe obesity (BMI >= 40) ([place]) 05/15/2016  . Sleep disturbance 10/21/2019  . Splenomegaly a  . Stage 3 chronic kidney disease ([place]) 10/21/2019  . Thrombocytopenia ([place]) 09/03/2017  . Tremor of both hands 05/15/2016  . Vitamin D deficiency 11/05/2017    Family History  Problem Relation Age of Onset  . Cancer Mother        ovarian  . Hyperlipidemia Mother   . Alzheimer's disease Mother   . Arthritis Mother        rheumatoid  . Cancer Father        lung  . Parkinson's disease Father   . Alzheimer's disease Father   . ALS Sister   . Cancer Maternal Aunt        pancreatic, lung, liver,breast  . Breast cancer Maternal Aunt   . Cancer Maternal Uncle        pancreatic, liver, brain  . Colon cancer Maternal Uncle   . Heart attack Paternal Aunt   . Stroke Paternal Aunt   . Stomach cancer Paternal Aunt   . Heart attack Paternal Uncle   . Stroke Paternal Grandmother   . Heart attack Paternal Grandfather   . Arthritis Sister        rheumatoid  . Huntington's disease Daughter        presumed inherited from father per patient  . Colon cancer Maternal Uncle   . Pancreatic cancer Maternal Uncle   . Liver disease Maternal Uncle   . Breast cancer Maternal Aunt   . Breast cancer Maternal Aunt   . Esophageal cancer Neg Hx   . Rectal cancer Neg Hx     Past Surgical History:  Procedure Laterality Date  . ABDOMINAL HYSTERECTOMY    . APPENDECTOMY    .  BACK SURGERY     thoracic-fractured   . BACK SURGERY     lumbar - fractured  . BREAST LUMPECTOMY WITH RADIOACTIVE SEED LOCALIZATION Right 08/01/2018   Procedure: RADIOACTIVE SEED GUIDED RIGHT BREAST LUMPECTOMY;  Surgeon: Coralie Keens, MD;  Location: [facility];  Service: General;  Laterality: Right;  . ESOPHAGOGASTRODUODENOSCOPY  05/20/2008   Mild to moderate esophagitis. Otherwise normal EGD.   Marland Kitchen EYE SURGERY Left    cataract surgery with lens implant  . GASTROSTOMY     peptic ulcer - peg tube placement   Social History   Occupational History  . Not on file  Tobacco Use  . Smoking status: Former Smoker    Packs/day: 1.00    Years: 22.00    Pack years: 22.00    Types: Cigarettes    Quit date: 06/26/1986    Years since quitting: 34.4  . Smokeless tobacco: Never Used  Vaping Use  . Vaping Use: Never used  Substance and Sexual Activity  . Alcohol use: No  . Drug use: No  . Sexual activity: Not Currently    Birth control/protection: None

## 2020-11-25 ENCOUNTER — Other Ambulatory Visit: Payer: Self-pay | Admitting: Specialist

## 2020-11-25 MED ORDER — HYDROCODONE-ACETAMINOPHEN 5-325 MG PO TABS: ORAL_TABLET | ORAL | 0 refills | Status: DC

## 2020-11-30 ENCOUNTER — Encounter: Payer: Self-pay | Admitting: Orthopaedic Surgery

## 2020-11-30 ENCOUNTER — Ambulatory Visit: Payer: Medicare Other | Admitting: Orthopaedic Surgery

## 2020-11-30 VITALS — Ht 60.0 in | Wt 159.8 lb

## 2020-11-30 DIAGNOSIS — M1712 Unilateral primary osteoarthritis, left knee: Secondary | ICD-10-CM | POA: Insufficient documentation

## 2020-11-30 HISTORY — DX: Unilateral primary osteoarthritis, left knee: M17.12

## 2020-11-30 NOTE — Progress Notes (Signed)
Office Visit Note   Patient: Amy Moon           Date of Birth: [birth year]           MRN: [id number] Visit Date: 11/30/2020              Requested by: Lorrene Reid, PA-C [address] [place],  [place] [address] PCP: Lorrene Reid, PA-C   Assessment & Plan: Visit Diagnoses:  1. Unilateral primary osteoarthritis, left knee     Plan: I spoke to her in length in detail about knee replacement surgery for her left knee.  I showed her knee model and went over her x-rays.  I described the risk and benefits of the surgery as well as what to expect with an intraoperative and postoperative course.  I agree with her proceeding with the surgery at this standpoint given the detrimental effect her knee pain is having on her mobility and her quality of life as well as her actives daily living combined with the failure of conservative treatment.  I showed her a knee replacement model as well and described in detail with surgery involves.  All questions and concerns were answered and addressed.  She would need to stop Plavix 1 week before surgery.  We will work on getting this scheduled sometime hopefully in the near future.  We will be in touch.  Follow-Up Instructions: Return for 2 weeks post-op.   Orders:  No orders of the defined types were placed in this encounter.  No orders of the defined types were placed in this encounter.     Procedures: No procedures performed   Clinical Data: No additional findings.   Subjective: Chief Complaint  Patient presents with  . Left Knee - Follow-up  The patient is a very pleasant and active 70 year old female with known and well-documented osteoarthritis of her left knee.  She has tried and failed other forms of conservative treatment and was sent to me by Dr. Louanne Skye to consider knee replacement surgery on her for her left knee pain.  She states that she is ready for this.  Her left knee pain is daily and it is severe.  It has been  worsening for over 12 months now.  It is detrimentally affecting her mobility, her quality of life and activities day living.  She is on Plavix but can come off of this for surgery.  She is not a diabetic.  She is interested in knee replacement surgery given her limited mobility and the severity of her left knee pain combined with the failure of all forms of conservative treatment for the left knee.  HPI  Review of Systems She currently denies any headache, chest pain, shortness of breath, fever, chills, nausea, vomiting  Objective: Vital Signs: Ht 5' (1.524 m)   Wt 159 lb 12.8 oz (72.5 kg)   BMI 31.21 kg/m   Physical Exam She is alert and orient x3 and in no acute distress.  She ambulates very slowly and gets up slowly out of a chair. Ortho Exam Examination of her left knee shows a moderate effusion.  There is varus malalignment that is not correctable.  There is a slight flexion contracture but good range of motion of the knee but is very painful especially in the medial and lateral joint lines. Specialty Comments:  No specialty comments available.  Imaging: No results found. 2 views of the left knee are reviewed and show severe end-stage arthritis.  There is complete  bone-on-bone wear of the medial compartment as well as the patellofemoral joint.  There is varus malalignment and essentially bone-on-bone wear.  PMFS History: Patient Active Problem List   Diagnosis Date Noted  . Unilateral primary osteoarthritis, left knee 11/30/2020  . Dyslipidemia 04/02/2020  . Splenomegaly   . Portal hypertension ([place])   . Pneumonia   . Osteoporosis   . Osteopenia   . Neuropathy   . Low vitamin D level   . History of kidney stones   . Hiatal hernia   . Hepatic disease   . Granulomatous disease ([place])   . GERD (gastroesophageal reflux disease)   . Enlarged thyroid   . Duodenal ulcer   . Drug-induced low platelet count   . Depression   . Clotting disorder ([place])   . Cirrhosis ([place])   .  Chronic kidney disease   . Cataract   . Cancer ([place])   . Asthma   . Arthritis   . Angina pectoris ([place])   . Anemia   . Allergy   . Stage 3 chronic kidney disease ([place]) 10/21/2019  . History of smoking 25-50 pack years- quit age 65, smoked 2ppd from 70 yo-70 yo. 10/21/2019  . History of surgery- pt has several Sx- no personal issues wiht anesthesia  10/21/2019  . Sleep disturbance 10/21/2019  . History of exposure to asbestos 10/21/2019  . Family history of adverse reaction to anesthesia   . Dyspnea on exertion 10/17/2019  . Encounter for Medicare annual wellness exam 05/09/2018  . Vitamin D deficiency 11/05/2017  . Encounter for hepatitis C virus screening test for high risk patient 11/05/2017  . Dysphagia 09/03/2017  . Thrombocytopenia ([place]) 09/03/2017  . Healthcare maintenance 07/30/2017  . Chronic pain syndrome 07/30/2017  . Chest pain 07/30/2017  . Parkinson's disease ([place]) 07/30/2017  . Hypertension 07/30/2017  . Severe episode of recurrent major depressive disorder, without psychotic features ([place]) 05/15/2016  . Tremor of both hands 05/15/2016  . Severe obesity (BMI >= 40) ([place]) 05/15/2016  . Nausea 03/01/2016  . Irritant contact dermatitis 08/25/2015   Past Medical History:  Diagnosis Date  . Allergy   . Anemia   . Angina pectoris ([place])   . Arthritis   . Asthma    years ago  . Bronchitis 06/03/2018  . Cancer ([facility])    squamous cell carcinoma, nose  . Cataract   . Chest pain 07/30/2017  . Chronic kidney disease    possibly per pt  . Chronic pain syndrome 07/30/2017  . Cirrhosis ([place])   . Clotting disorder ([facility])    platelets are low  . Coronary artery disease 04/02/2020  . Depression   . Drug-induced low platelet count   . Duodenal ulcer   . Dyslipidemia 04/02/2020  . Dysphagia 09/03/2017  . Dyspnea on exertion 10/17/2019  . Encounter for hepatitis C virus screening test for high risk patient 11/05/2017  . Encounter for Medicare annual wellness exam 05/09/2018  .  Enlarged thyroid   . Family history of adverse reaction to anesthesia    father had difficulty waking up and breathing on his own after surgery  . GERD (gastroesophageal reflux disease)   . Granulomatous disease ([place])   . Healthcare maintenance 07/30/2017  . Hepatic disease   . Hiatal hernia   . History of exposure to asbestos 10/21/2019  . History of kidney stones   . History of smoking 25-50 pack years- quit age 6, smoked 2ppd from 70 yo-70 yo. 10/21/2019  . History of surgery-  pt has several Sx- no personal issues wiht anesthesia  10/21/2019  . Hypertension   . Irritant contact dermatitis 08/25/2015  . Low vitamin D level   . Nausea 03/01/2016  . Neuropathy   . Osteopenia   . Osteoporosis   . Parkinson's disease ([place])   . Pneumonia   . Portal hypertension ([place])   . Severe episode of recurrent major depressive disorder, without psychotic features ([place]) 05/15/2016  . Severe obesity (BMI >= 40) ([place]) 05/15/2016  . Sleep disturbance 10/21/2019  . Splenomegaly a  . Stage 3 chronic kidney disease ([place]) 10/21/2019  . Thrombocytopenia ([place]) 09/03/2017  . Tremor of both hands 05/15/2016  . Vitamin D deficiency 11/05/2017    Family History  Problem Relation Age of Onset  . Cancer Mother        ovarian  . Hyperlipidemia Mother   . Alzheimer's disease Mother   . Arthritis Mother        rheumatoid  . Cancer Father        lung  . Parkinson's disease Father   . Alzheimer's disease Father   . ALS Sister   . Cancer Maternal Aunt        pancreatic, lung, liver,breast  . Breast cancer Maternal Aunt   . Cancer Maternal Uncle        pancreatic, liver, brain  . Colon cancer Maternal Uncle   . Heart attack Paternal Aunt   . Stroke Paternal Aunt   . Stomach cancer Paternal Aunt   . Heart attack Paternal Uncle   . Stroke Paternal Grandmother   . Heart attack Paternal Grandfather   . Arthritis Sister        rheumatoid  . Huntington's disease Daughter        presumed inherited from father per  patient  . Colon cancer Maternal Uncle   . Pancreatic cancer Maternal Uncle   . Liver disease Maternal Uncle   . Breast cancer Maternal Aunt   . Breast cancer Maternal Aunt   . Esophageal cancer Neg Hx   . Rectal cancer Neg Hx     Past Surgical History:  Procedure Laterality Date  . ABDOMINAL HYSTERECTOMY    . APPENDECTOMY    . BACK SURGERY     thoracic-fractured   . BACK SURGERY     lumbar - fractured  . BREAST LUMPECTOMY WITH RADIOACTIVE SEED LOCALIZATION Right 08/01/2018   Procedure: RADIOACTIVE SEED GUIDED RIGHT BREAST LUMPECTOMY;  Surgeon: Coralie Keens, MD;  Location: [facility];  Service: General;  Laterality: Right;  . ESOPHAGOGASTRODUODENOSCOPY  05/20/2008   Mild to moderate esophagitis. Otherwise normal EGD.   [facility] EYE SURGERY Left    cataract surgery with lens implant  . GASTROSTOMY     peptic ulcer - peg tube placement   Social History   Occupational History  . Not on file  Tobacco Use  . Smoking status: Former Smoker    Packs/day: 1.00    Years: 22.00    Pack years: 22.00    Types: Cigarettes    Quit date: 06/26/1986    Years since quitting: 34.4  . Smokeless tobacco: Never Used  Vaping Use  . Vaping Use: Never used  Substance and Sexual Activity  . Alcohol use: No  . Drug use: No  . Sexual activity: Not Currently    Birth control/protection: None

## 2020-12-01 ENCOUNTER — Other Ambulatory Visit: Payer: Self-pay | Admitting: Specialist

## 2020-12-01 ENCOUNTER — Other Ambulatory Visit: Payer: Self-pay | Admitting: Physician Assistant

## 2020-12-01 ENCOUNTER — Other Ambulatory Visit: Payer: Self-pay | Admitting: Cardiology

## 2020-12-01 DIAGNOSIS — I10 Essential (primary) hypertension: Secondary | ICD-10-CM

## 2020-12-01 NOTE — Telephone Encounter (Signed)
Rx approved and sent 

## 2020-12-14 ENCOUNTER — Ambulatory Visit: Payer: Medicare Other | Admitting: Cardiology

## 2020-12-14 ENCOUNTER — Other Ambulatory Visit: Payer: Self-pay | Admitting: Specialist

## 2020-12-20 ENCOUNTER — Other Ambulatory Visit: Payer: Self-pay

## 2020-12-20 DIAGNOSIS — H353131 Nonexudative age-related macular degeneration, bilateral, early dry stage: Secondary | ICD-10-CM | POA: Diagnosis not present

## 2020-12-22 ENCOUNTER — Other Ambulatory Visit: Payer: Self-pay | Admitting: Cardiology

## 2020-12-22 ENCOUNTER — Other Ambulatory Visit: Payer: Self-pay | Admitting: Specialist

## 2020-12-30 ENCOUNTER — Other Ambulatory Visit: Payer: Self-pay | Admitting: Specialist

## 2021-01-04 ENCOUNTER — Ambulatory Visit: Payer: Medicare Other | Admitting: Physician Assistant

## 2021-01-06 ENCOUNTER — Other Ambulatory Visit: Payer: Self-pay | Admitting: Specialist

## 2021-01-07 ENCOUNTER — Other Ambulatory Visit: Payer: Self-pay | Admitting: Physician Assistant

## 2021-01-10 ENCOUNTER — Other Ambulatory Visit: Payer: Self-pay | Admitting: Specialist

## 2021-01-10 NOTE — Telephone Encounter (Signed)
Can you please advise since Dr Nitka is out of the office? 

## 2021-01-11 NOTE — Progress Notes (Signed)
DUE TO COVID-19 ONLY ONE VISITOR IS ALLOWED TO COME WITH YOU AND STAY IN THE WAITING ROOM ONLY DURING PRE OP AND PROCEDURE DAY OF SURGERY. THE 1 VISITOR  MAY VISIT WITH YOU AFTER SURGERY IN YOUR PRIVATE ROOM DURING VISITING HOURS ONLY!  YOU NEED TO HAVE A COVID 19 TEST ON__7/26/22 _____ @_______ , THIS TEST MUST BE DONE BEFORE SURGERY,  COVID TESTING SITE [address] [place] [address], IT IS ON THE RIGHT GOING OUT [address] APPROXIMATELY  2 MINUTES PAST [employer] ON THE RIGHT. ONCE YOUR COVID TEST IS COMPLETED,  PLEASE BEGIN THE QUARANTINE INSTRUCTIONS AS OUTLINED IN YOUR HANDOUT.                KYSA CALAIS  01/11/2021   Your procedure is scheduled on:                       01/21/2021   Report to [facility] Main  Entrance   Report to admitting at    0600 AM     Call this number if you have problems the morning of surgery [phone]    REMEMBER: NO  SOLID FOOD CANDY OR GUM AFTER MIDNIGHT. CLEAR LIQUIDS UNTIL      0530am      . NOTHING BY MOUTH EXCEPT CLEAR LIQUIDS UNTIL     0530am . PLEASE FINISH ENSURE DRINK PER SURGEON ORDER  WHICH NEEDS TO BE COMPLETED AT    0530am     .      CLEAR LIQUID DIET   Foods Allowed                                                                    Coffee and tea, regular and decaf                            Fruit ices (not with fruit pulp)                                      Iced Popsicles                                    Carbonated beverages, regular and diet                                    Cranberry, grape and apple juices Sports drinks like Gatorade Lightly seasoned clear broth or consume(fat free) Sugar, honey syrup ___________________________________________________________________      BRUSH YOUR TEETH MORNING OF SURGERY AND RINSE YOUR MOUTH OUT, NO CHEWING GUM CANDY OR MINTS.     Take these medicines the morning of surgery with A SIP OF WATER:     Ranexa, zoloft, imdur, protonix, lyrica   DO NOT TAKE ANY DIABETIC MEDICATIONS DAY OF YOUR SURGERY  You may not have any metal on your body including hair pins and              piercings  Do not wear jewelry, make-up, lotions, powders or perfumes, deodorant             Do not wear nail polish on your fingernails.  Do not shave  48 hours prior to surgery.              Men may shave face and neck.   Do not bring valuables to the hospital. [place].  Contacts, dentures or bridgework may not be worn into surgery.  Leave suitcase in the car. After surgery it may be brought to your room.     Patients discharged the day of surgery will not be allowed to drive home. IF YOU ARE HAVING SURGERY AND GOING HOME THE SAME DAY, YOU MUST HAVE AN ADULT TO DRIVE YOU HOME AND BE WITH YOU FOR 24 HOURS. YOU MAY GO HOME BY TAXI OR UBER OR ORTHERWISE, BUT AN ADULT MUST ACCOMPANY YOU HOME AND STAY WITH YOU FOR 24 HOURS.  Name and phone number of your driver:  Special Instructions: N/A              Please read over the following fact sheets you were given: _____________________________________________________________________  [facility] - Preparing for Surgery Before surgery, you can play an important role.  Because skin is not sterile, your skin needs to be as free of germs as possible.  You can reduce the number of germs on your skin by washing with CHG (chlorahexidine gluconate) soap before surgery.  CHG is an antiseptic cleaner which kills germs and bonds with the skin to continue killing germs even after washing. Please DO NOT use if you have an allergy to CHG or antibacterial soaps.  If your skin becomes reddened/irritated stop using the CHG and inform your nurse when you arrive at Short Stay. Do not shave (including legs and underarms) for at least 48 hours prior to the first CHG shower.  You may shave your face/neck. Please follow these instructions carefully:  1.   Shower with CHG Soap the night before surgery and the  morning of Surgery.  2.  If you choose to wash your hair, wash your hair first as usual with your  normal  shampoo.  3.  After you shampoo, rinse your hair and body thoroughly to remove the  shampoo.                           4.  Use CHG as you would any other liquid soap.  You can apply chg directly  to the skin and wash                       Gently with a scrungie or clean washcloth.  5.  Apply the CHG Soap to your body ONLY FROM THE NECK DOWN.   Do not use on face/ open                           Wound or open sores. Avoid contact with eyes, ears mouth and genitals (private parts).  Wash face,  Genitals (private parts) with your normal soap.             6.  Wash thoroughly, paying special attention to the area where your surgery  will be performed.  7.  Thoroughly rinse your body with warm water from the neck down.  8.  DO NOT shower/wash with your normal soap after using and rinsing off  the CHG Soap.                9.  Pat yourself dry with a clean towel.            10.  Wear clean pajamas.            11.  Place clean sheets on your bed the night of your first shower and do not  sleep with pets. Day of Surgery : Do not apply any lotions/deodorants the morning of surgery.  Please wear clean clothes to the hospital/surgery center.  FAILURE TO FOLLOW THESE INSTRUCTIONS MAY RESULT IN THE CANCELLATION OF YOUR SURGERY PATIENT SIGNATURE_________________________________  NURSE SIGNATURE__________________________________  ________________________________________________________________________

## 2021-01-12 ENCOUNTER — Encounter: Payer: Self-pay | Admitting: Physician Assistant

## 2021-01-12 ENCOUNTER — Other Ambulatory Visit: Payer: Self-pay | Admitting: Physician Assistant

## 2021-01-12 ENCOUNTER — Other Ambulatory Visit: Payer: Self-pay | Admitting: Family

## 2021-01-12 ENCOUNTER — Ambulatory Visit (INDEPENDENT_AMBULATORY_CARE_PROVIDER_SITE_OTHER): Payer: Medicare Other | Admitting: Physician Assistant

## 2021-01-12 VITALS — Ht 60.0 in | Wt 162.0 lb

## 2021-01-12 DIAGNOSIS — F32A Depression, unspecified: Secondary | ICD-10-CM | POA: Diagnosis not present

## 2021-01-12 DIAGNOSIS — G4700 Insomnia, unspecified: Secondary | ICD-10-CM

## 2021-01-12 DIAGNOSIS — F419 Anxiety disorder, unspecified: Secondary | ICD-10-CM

## 2021-01-12 DIAGNOSIS — R072 Precordial pain: Secondary | ICD-10-CM

## 2021-01-12 MED ORDER — QUETIAPINE FUMARATE 25 MG PO TABS: 25.0000 mg | ORAL_TABLET | Freq: Every day | ORAL | 1 refills | Status: DC

## 2021-01-12 NOTE — Progress Notes (Signed)
Telehealth office visit note for Amy Reid, PA-C- at Primary Care at [facility]   I connected with current patient today by telephone and verified that I am speaking with the correct person    Location of the patient: Home  Location of the provider: Office - This visit type was conducted due to national recommendations for restrictions regarding the COVID-19 Pandemic (e.g. social distancing) in an effort to limit this patient's exposure and mitigate transmission in our community.    - No physical exam could be performed with this format, beyond that communicated to [place] by the patient/ family members as noted.   - Additionally my office staff/ schedulers were to discuss with the patient that there may be a monetary charge related to this service, depending on their medical insurance.  My understanding is that patient understood and consented to proceed.     _________________________________________________________________________________   History of Present Illness: Patient calls in to follow-up on mood and insomnia.  Patient was started on mirtazapine to help with sleep and mood but reports medication has not been very helpful. Is sleeping about 2-3 hours per night, feels more tired. States has constant thoughts at bedtime which make it challenging for her to relax. Reports misses her daughter. Patient was started on Lasix by cardiology and states medication has helped with fluid retention/swelling.     GAD 7 : Generalized Anxiety Score 01/12/2021 12/22/2019  Nervous, Anxious, on Edge 1 3  Control/stop worrying 3 1  Worry too much - different things 2 3  Trouble relaxing 2 3  Restless 2 1  Easily annoyed or irritable 1 0  Afraid - awful might happen 1 1  Total GAD 7 Score 12 12  Anxiety Difficulty - Somewhat difficult    Depression screen [facility] 2/9 01/12/2021 10/05/2020 07/08/2020 05/18/2020 03/23/2020  Decreased Interest 0 0 0 2 1  Down, Depressed, Hopeless 1 1 0 3 -  PHQ -  2 Score 1 1 0 5 1  Altered sleeping 3 3 0 3 3  Tired, decreased energy 1 3 0 3 3  Change in appetite 0 1 0 0 1  Feeling bad or failure about yourself  0 1 0 1 0  Trouble concentrating 0 1 0 1 1  Moving slowly or fidgety/restless 1 0 0 0 0  Suicidal thoughts 0 0 0 0 0  PHQ-9 Score 6 10 0 13 9  Difficult doing work/chores - - - Very difficult Somewhat difficult  Some recent data might be hidden      Impression and Recommendations:     1. Insomnia, unspecified type   2. Anxiety and depression     Insomnia: -Patient has failed multiple drug therapies (trazodone, Lunesta and mirtazapine). Of note, patient also takes diphenhydramine for allergies and reports medication does not make her drowsy, recommend taking second generation antihistamine for allergies such as Claritin, Allegra, or Zyrtec. Discussed referral to sleep specialist and prefers to trial another medication before considering referral. Will trial low dose of Seroquel. Discussed potential side effects and advised patient to let me know if unable to tolerate medication. Discussed tapering instruction for mirtazapine.  -Will reassess symptoms and medication therapy in 8-12 weeks .  Anxiety and depression: -GAD-7 score of 12, unchanged. PHQ-9 score of 6, some improvement. -Will discontinue mirtazapine and start Seroquel to help improve insomnia which will also provide benefits for mood. If mood fails to improve or worsen will consider treatment adjustments such as SSRI. -  Recommend to consider [place] therapy. -Will reassess symptoms and medication therapy in 8-12 weeks.   - As part of my medical decision making, I reviewed the following data within the [place] History obtained from pt /family, CMA notes reviewed and incorporated if applicable, Labs reviewed, Radiograph/ tests reviewed if applicable and OV notes from prior OV's with me, as well as any other specialists she/he has seen since seeing me last, were all  reviewed and used in my medical decision making process today.    - Additionally, when appropriate, discussion had with patient regarding our treatment plan, and their biases/concerns about that plan were used in my medical decision making today.    - The patient agreed with the plan and demonstrated an understanding of the instructions.   No barriers to understanding were identified.     - The patient was advised to call back or seek an in-person evaluation if the symptoms worsen or if the condition fails to improve as anticipated.   Return for Mood, insomnia in 8-12 weeks .    No orders of the defined types were placed in this encounter.   Meds ordered this encounter  Medications   QUEtiapine (SEROQUEL) 25 MG tablet    Sig: Take 1 tablet (25 mg total) by mouth at bedtime.    Dispense:  30 tablet    Refill:  1    Order Specific Question:   Supervising Provider    Answer:   Beatrice Lecher D [[id number]]     Medications Discontinued During This Encounter  Medication Reason   clindamycin (CLEOCIN) 300 MG capsule Completed Course   eszopiclone (LUNESTA) 1 MG TABS tablet Patient Preference   isosorbide mononitrate (IMDUR) 30 MG 24 hr tablet Patient Preference   sertraline (ZOLOFT) 25 MG tablet Patient Preference   mirtazapine (REMERON) 7.5 MG tablet Change in therapy       Time spent on telephone encounter was 15 minutes.      The [place] was signed into law in 2016 which includes the topic of electronic health records.  This provides immediate access to information in MyChart.  This includes consultation notes, operative notes, office notes, lab results and pathology reports.  If you have any questions about what you read please let us know at your next visit or call us at the office.  We are right here with you.   __________________________________________________________________________________     Patient Care Team    Relationship Specialty  Notifications Start End  Amy Moon, [place] PCP - General Physician Assistant  12/22/19   Park Liter, MD PCP - Cardiology Cardiology Admissions 07/01/18   Jackquline Denmark, MD Consulting Physician Gastroenterology  05/09/18      -Vitals obtained; medications/ allergies reconciled;  personal medical, social, Sx etc.histories were updated by CMA, reviewed by me and are reflected in chart   Patient Active Problem List   Diagnosis Date Noted   Unilateral primary osteoarthritis, left knee 11/30/2020   Dyslipidemia 04/02/2020   Splenomegaly    Portal hypertension ([place])    Pneumonia    Osteoporosis    Osteopenia    Neuropathy    Low vitamin D level    History of kidney stones    Hiatal hernia    Hepatic disease    Granulomatous disease (HCC)    GERD (gastroesophageal reflux disease)    Enlarged thyroid    Duodenal ulcer    Drug-induced low platelet count    Depression  Clotting disorder ([place])    Cirrhosis ([place])    Chronic kidney disease    Cataract    Cancer ([place])    Asthma    Arthritis    Angina pectoris ([place])    Anemia    Allergy    Stage 3 chronic kidney disease ([place]) 10/21/2019   History of smoking 25-50 pack years- quit age 12, smoked 2ppd from 70 yo-70 yo. 10/21/2019   History of surgery- pt has several Sx- no personal issues wiht anesthesia  10/21/2019   Sleep disturbance 10/21/2019   History of exposure to asbestos 10/21/2019   Family history of adverse reaction to anesthesia    Dyspnea on exertion 10/17/2019   Encounter for Medicare annual wellness exam 05/09/2018   Vitamin D deficiency 11/05/2017   Encounter for hepatitis C virus screening test for high risk patient 11/05/2017   Dysphagia 09/03/2017   Thrombocytopenia ([place]) 09/03/2017   Healthcare maintenance 07/30/2017   Chronic pain syndrome 07/30/2017   Chest pain 07/30/2017   Parkinson's disease ([place]) 07/30/2017   Hypertension 07/30/2017   Severe episode of recurrent major depressive disorder,  without psychotic features ([place]) 05/15/2016   Tremor of both hands 05/15/2016   Severe obesity (BMI >= 40) (HCC) 05/15/2016   Nausea 03/01/2016   Irritant contact dermatitis 08/25/2015     Current Meds  Medication Sig   acetaminophen (TYLENOL) 650 MG CR tablet Take 650 mg by mouth every 6 (six) hours as needed for pain.   atorvastatin (LIPITOR) 10 MG tablet TAKE 1 TABLET (10 MG TOTAL) BY MOUTH DAILY.   bisacodyl (DULCOLAX) 5 MG EC tablet Take 15 mg by mouth at bedtime as needed for moderate constipation.   clopidogrel (PLAVIX) 75 MG tablet Take 1 tablet (75 mg total) by mouth daily.   cyclobenzaprine (FLEXERIL) 10 MG tablet TAKE 1 TABLET BY MOUTH EVERY 8 HOURS AS NEEDED (Patient taking differently: Take 10 mg by mouth every 8 (eight) hours as needed for muscle spasms.)   diphenhydrAMINE (BENADRYL) 25 MG tablet Take 25 mg by mouth every 6 (six) hours as needed for allergies.   furosemide (LASIX) 40 MG tablet Take 1 tablet (40 mg total) by mouth daily.   HYDROcodone-acetaminophen (NORCO/VICODIN) 5-325 MG tablet TAKE 1 TABLET BY MOUTH EVERY 4 HOURS AS NEEDED FOR MODERATE OR SEVERE PAIN FOR UP TO 5 DAYS (Patient taking differently: Take 1 tablet by mouth every 4 (four) hours as needed for moderate pain or severe pain.)   losartan (COZAAR) 50 MG tablet TAKE 1 TABLET (50 MG TOTAL) BY MOUTH DAILY.   nitroGLYCERIN (NITROSTAT) 0.4 MG SL tablet Place 0.4 mg under the tongue every 5 (five) minutes as needed for chest pain.    pantoprazole (PROTONIX) 40 MG tablet TAKE 1 TABLET BY MOUTH DAILY. (Patient taking differently: Take 40 mg by mouth daily.)   potassium chloride (KLOR-CON) 10 MEQ tablet TAKE 1 TABLET (10 MEQ TOTAL) BY MOUTH DAILY.   pregabalin (LYRICA) 50 MG capsule TAKE 1 CAPSULE BY MOUTH 3 TIMES DAILY. (Patient taking differently: Take 50 mg by mouth 3 (three) times daily.)   promethazine (PHENERGAN) 25 MG tablet TAKE 1 TABLET BY MOUTH 4 TIMES DAILY AS NEEDED FOR NAUSEA OR VOMITING. (Patient  taking differently: Take 25 mg by mouth 4 (four) times daily as needed for nausea or vomiting.)   QUEtiapine (SEROQUEL) 25 MG tablet Take 1 tablet (25 mg total) by mouth at bedtime.   ranolazine (RANEXA) 1000 MG SR tablet TAKE 1 TABLET BY MOUTH 2 TIMES  DAILY. (Patient taking differently: Take 500 mg by mouth 2 (two) times daily.)   [DISCONTINUED] mirtazapine (REMERON) 7.5 MG tablet Take 1 tablet (7.5 mg total) by mouth at bedtime. Start medication once weaned off sertraline (Zoloft).     Allergies:  Allergies  Allergen Reactions   Iodinated Diagnostic Agents Hives   Nsaids Other (See Comments)    History of bleeding ulcers   Latex Hives and Rash   Sinemet [Carbidopa W-Levodopa] Nausea And Vomiting   Inderal [Propranolol] Other (See Comments)    Unknown   Indomethacin Other (See Comments)    Unknown   Penicillin G Other (See Comments)    Unknown   Pneumococcal Vaccines Other (See Comments)    Caused "pneumonia"   Scopolamine Other (See Comments)    The patch, caused her to pass out / change in mental status   Tape Other (See Comments)    Unknown     ROS:  See above HPI for pertinent positives and negatives   Objective:   Height 5' (1.524 m), weight 162 lb (73.5 kg).   (if some vitals are omitted, this means that patient was UNABLE to obtain them.) General: A & O * 3; sounds in no acute distress  Respiratory: speaking in full sentences, no conversational dyspnea Psych: insight appears good, mood- appears full

## 2021-01-14 ENCOUNTER — Other Ambulatory Visit: Payer: Self-pay

## 2021-01-14 ENCOUNTER — Other Ambulatory Visit: Payer: Self-pay | Admitting: Specialist

## 2021-01-14 ENCOUNTER — Encounter (HOSPITAL_COMMUNITY)
Admission: RE | Admit: 2021-01-14 | Discharge: 2021-01-14 | Disposition: A | Discharge status: Home / Self Care | Payer: Medicare Other | Source: Ambulatory Visit | Attending: Orthopaedic Surgery | Admitting: Orthopaedic Surgery

## 2021-01-14 ENCOUNTER — Encounter (HOSPITAL_COMMUNITY): Payer: Self-pay

## 2021-01-14 DIAGNOSIS — I10 Essential (primary) hypertension: Secondary | ICD-10-CM | POA: Insufficient documentation

## 2021-01-14 DIAGNOSIS — M1712 Unilateral primary osteoarthritis, left knee: Secondary | ICD-10-CM | POA: Diagnosis not present

## 2021-01-14 DIAGNOSIS — K219 Gastro-esophageal reflux disease without esophagitis: Secondary | ICD-10-CM | POA: Diagnosis not present

## 2021-01-14 DIAGNOSIS — I739 Peripheral vascular disease, unspecified: Secondary | ICD-10-CM | POA: Diagnosis not present

## 2021-01-14 DIAGNOSIS — Z79899 Other long term (current) drug therapy: Secondary | ICD-10-CM | POA: Diagnosis not present

## 2021-01-14 DIAGNOSIS — Z01812 Encounter for preprocedural laboratory examination: Secondary | ICD-10-CM | POA: Insufficient documentation

## 2021-01-14 DIAGNOSIS — N183 Chronic kidney disease, stage 3 unspecified: Secondary | ICD-10-CM | POA: Diagnosis not present

## 2021-01-14 DIAGNOSIS — Z7902 Long term (current) use of antithrombotics/antiplatelets: Secondary | ICD-10-CM | POA: Insufficient documentation

## 2021-01-14 HISTORY — DX: Peripheral vascular disease, unspecified: I73.9

## 2021-01-14 HISTORY — DX: Other specified postprocedural states: Z98.890

## 2021-01-14 HISTORY — DX: Nausea with vomiting, unspecified: R11.2

## 2021-01-14 HISTORY — DX: Migraine, unspecified, not intractable, without status migrainosus: G43.909

## 2021-01-14 LAB — CBC
HCT: 37.8 % (ref 36.0–46.0)
Hemoglobin: 12.8 g/dL (ref 12.0–15.0)
MCH: 31.8 pg (ref 26.0–34.0)
MCHC: 33.9 g/dL (ref 30.0–36.0)
MCV: 93.8 fL (ref 80.0–100.0)
Platelets: 139 10*3/uL — ABNORMAL LOW (ref 150–400)
RBC: 4.03 MIL/uL (ref 3.87–5.11)
RDW: 13.1 % (ref 11.5–15.5)
WBC: 5.2 10*3/uL (ref 4.0–10.5)
nRBC: 0 % (ref 0.0–0.2)

## 2021-01-14 LAB — COMPREHENSIVE METABOLIC PANEL
ALT: 16 U/L (ref 0–44)
AST: 18 U/L (ref 15–41)
Albumin: 4 g/dL (ref 3.5–5.0)
Alkaline Phosphatase: 98 U/L (ref 38–126)
Anion gap: 6 (ref 5–15)
BUN: 9 mg/dL (ref 8–23)
CO2: 29 mmol/L (ref 22–32)
Calcium: 9.2 mg/dL (ref 8.9–10.3)
Chloride: 105 mmol/L (ref 98–111)
Creatinine, Ser: 0.63 mg/dL (ref 0.44–1.00)
GFR, Estimated: >60 mL/min (ref >60)
Glucose, Bld: 98 mg/dL (ref 70–99)
Potassium: 3.2 mmol/L — ABNORMAL LOW (ref 3.5–5.1)
Sodium: 140 mmol/L (ref 135–145)
Total Bilirubin: 0.7 mg/dL (ref 0.3–1.2)
Total Protein: 6.9 g/dL (ref 6.5–8.1)

## 2021-01-14 LAB — SURGICAL PCR SCREEN
MRSA, PCR: NEGATIVE
Staphylococcus aureus: NEGATIVE

## 2021-01-14 NOTE — Progress Notes (Addendum)
COVID Vaccine Completed: No Date COVID Vaccine completed: N/A Has received booster: N/A COVID vaccine manufacturer: N/A Date of COVID positive in last 90 days: No  PCP - Lorrene Reid, PA-C [facility] Cardiologist - Park Liter, MD last office note in epic 10/01/2020  Chest x-ray - N/A EKG - 10/04/2020 in epic Stress Test - greater  than 2 years in epic ECHO - 10/24/2019 in epic Cardiac Cath - N/A Pacemaker/ICD device last checked: N/A  Sleep Study - N/A CPAP - N/A  Fasting Blood Sugar - N/A Checks Blood Sugar __N/A___ times a day  Blood Thinner Instructions: Plavix last dose on 01/13/2021 Aspirin Instructions:N/A Last Dose:N/A  Activity level:  Unable to go up a flight of stairs without symptoms      Anesthesia review: CAD, HTN, Liver disease, Kidney Disease, Potassium 3.2 01/14/21  Patient denies shortness of breath, fever, cough and chest pain at PAT appointment   Patient verbalized understanding of instructions that were given to them at the PAT appointment. Patient was also instructed that they will need to review over the PAT instructions again at home before surgery.

## 2021-01-14 NOTE — Patient Instructions (Addendum)
DUE TO COVID-19 ONLY ONE VISITOR IS ALLOWED TO COME WITH YOU AND STAY IN THE WAITING ROOM ONLY DURING PRE OP AND PROCEDURE.   **NO VISITORS ARE ALLOWED IN THE SHORT STAY AREA OR RECOVERY ROOM!!**  IF YOU WILL BE ADMITTED INTO THE HOSPITAL YOU ARE ALLOWED ONLY TWO SUPPORT PEOPLE DURING VISITATION HOURS ONLY (10AM -8PM)   The support person(s) may change daily. The support person(s) must pass our screening, gel in and out, and wear a mask at all times, including in the patient's room. Patients must also wear a mask when staff or their support person are in the room.  No visitors under the age of 54. Any visitor under the age of 60 must be accompanied by an adult.    COVID SWAB TESTING MUST BE COMPLETED ON:  Wednesday, January 19, 2021 at 2:05 PM   [address] [place],  [address]    You are not required to quarantine, however you are required to wear a well-fitted mask when you are out and around people not in your household.  Hand Hygiene often Do NOT share personal items Notify your provider if you are in close contact with someone who has COVID or you develop fever 100.4 or greater, new onset of sneezing, cough, sore throat, shortness of breath or body aches.        Your procedure is scheduled on: Friday, January 21, 2021   Report to [facility] Main  Entrance    Report to admitting at 6:00 AM   Call this number if you have problems the morning of surgery [phone]   Do not eat food :After Midnight.   May have liquids until  5:30 AM  day of surgery  CLEAR LIQUID DIET  Foods Allowed                                                                     Foods Excluded  Water, Black Coffee and tea, regular and decaf                             liquids that you cannot  Plain Jell-O in any flavor  (No red)                                           see through such as: Fruit ices (not with fruit pulp)                                     milk, soups, orange juice               Iced Popsicles (No red)                                    All solid food  Apple juices Sports drinks like Gatorade (No red) Lightly seasoned clear broth or consume(fat free) Sugar, honey syrup  Sample Menu Breakfast                                Lunch                                     Supper Cranberry juice                    Beef broth                            Chicken broth Jell-O                                     Grape juice                           Apple juice Coffee or tea                        Jell-O                                      Popsicle                                                Coffee or tea                        Coffee or tea        The day of surgery:  Drink ONE (1) Pre-Surgery Clear Ensure 5:30AM the morning of surgery. Drink in one sitting. Do not sip.  This drink was given to you during your hospital  pre-op appointment visit. Nothing else to drink after completing the  Pre-Surgery Clear Ensure           If you have questions, please contact your surgeon's office.     Oral Hygiene is also important to reduce your risk of infection.                                    Remember - BRUSH YOUR TEETH THE MORNING OF SURGERY WITH YOUR REGULAR TOOTHPASTE   Do NOT smoke after Midnight   Take these medicines the morning of surgery with A SIP OF WATER: Atorvastatin, Pantoprazole, Pregablin, Ranolazine                              You may not have any metal on your body including hair pins, jewelry, and body piercing             Do not wear make-up, lotions, powders, perfumes/cologne, or deodorant  Do not wear nail polish including gel and S&S, artificial/acrylic nails, or any other type of covering on natural nails  including finger and toenails. If you have artificial nails, gel coating, etc. that needs to be removed by a nail salon please have this removed prior to surgery or surgery may need to be canceled/ delayed if  the surgeon/ anesthesia feels like they are unable to be safely monitored.   Do not shave  48 hours prior to surgery.        Do not bring valuables to the hospital. [place].   Contacts, dentures or bridgework may not be worn into surgery.   Bring small overnight bag day of surgery.    Patients discharged the day of surgery will not be allowed to drive home.   Special Instructions: Bring a copy of your healthcare power of attorney and living will documents         the day of surgery if you haven't scanned them in before.              Please read over the following fact sheets you were given: IF YOU HAVE QUESTIONS ABOUT YOUR PRE OP INSTRUCTIONS PLEASE CALL [phone]   [place] - Preparing for Surgery Before surgery, you can play an important role.  Because skin is not sterile, your skin needs to be as free of germs as possible.  You can reduce the number of germs on your skin by washing with CHG (chlorahexidine gluconate) soap before surgery.  CHG is an antiseptic cleaner which kills germs and bonds with the skin to continue killing germs even after washing. Please DO NOT use if you have an allergy to CHG or antibacterial soaps.  If your skin becomes reddened/irritated stop using the CHG and inform your nurse when you arrive at Short Stay. Do not shave (including legs and underarms) for at least 48 hours prior to the first CHG shower.  You may shave your face/neck.  Please follow these instructions carefully:  1.  Shower with CHG Soap the night before surgery and the  morning of surgery.  2.  If you choose to wash your hair, wash your hair first as usual with your normal  shampoo.  3.  After you shampoo, rinse your hair and body thoroughly to remove the shampoo.                             4.  Use CHG as you would any other liquid soap.  You can apply chg directly to the skin and wash.  Gently with a scrungie or clean washcloth.  5.   Apply the CHG Soap to your body ONLY FROM THE NECK DOWN.   Do   not use on face/ open                           Wound or open sores. Avoid contact with eyes, ears mouth and   genitals (private parts).                       Wash face,  Genitals (private parts) with your normal soap.             6.  Wash thoroughly, paying special attention to the area where your    surgery  will be performed.  7.  Thoroughly rinse your body with warm water from the neck down.  8.  DO NOT shower/wash with your normal soap after using and rinsing off the CHG Soap.                9.  Pat yourself dry with a clean towel.            10.  Wear clean pajamas.            11.  Place clean sheets on your bed the night of your first shower and do not  sleep with pets. Day of Surgery : Do not apply any lotions/deodorants the morning of surgery.  Please wear clean clothes to the hospital/surgery center.  FAILURE TO FOLLOW THESE INSTRUCTIONS MAY RESULT IN THE CANCELLATION OF YOUR SURGERY  PATIENT SIGNATURE_________________________________  NURSE SIGNATURE__________________________________  ________________________________________________________________________    Amy Moon  An incentive spirometer is a tool that can help keep your lungs clear and active. This tool measures how well you are filling your lungs with each breath. Taking long deep breaths may help reverse or decrease the chance of developing breathing (pulmonary) problems (especially infection) following: A long period of time when you are unable to move or be active. BEFORE THE PROCEDURE  If the spirometer includes an indicator to show your best effort, your nurse or respiratory therapist will set it to a desired goal. If possible, sit up straight or lean slightly forward. Try not to slouch. Hold the incentive spirometer in an upright position. INSTRUCTIONS FOR USE  Sit on the edge of your bed if possible, or sit up as far as you can in bed or  on a chair. Hold the incentive spirometer in an upright position. Breathe out normally. Place the mouthpiece in your mouth and seal your lips tightly around it. Breathe in slowly and as deeply as possible, raising the piston or the ball toward the top of the column. Hold your breath for 3-5 seconds or for as long as possible. Allow the piston or ball to fall to the bottom of the column. Remove the mouthpiece from your mouth and breathe out normally. Rest for a few seconds and repeat Steps 1 through 7 at least 10 times every 1-2 hours when you are awake. Take your time and take a few normal breaths between deep breaths. The spirometer may include an indicator to show your best effort. Use the indicator as a goal to work toward during each repetition. After each set of 10 deep breaths, practice coughing to be sure your lungs are clear. If you have an incision (the cut made at the time of surgery), support your incision when coughing by placing a pillow or rolled up towels firmly against it. Once you are able to get out of bed, walk around indoors and cough well. You may stop using the incentive spirometer when instructed by your caregiver.  RISKS AND COMPLICATIONS Take your time so you do not get dizzy or light-headed. If you are in pain, you may need to take or ask for pain medication before doing incentive spirometry. It is harder to take a deep breath if you are having pain. AFTER USE Rest and breathe slowly and easily. It can be helpful to keep track of a log of your progress. Your caregiver can provide you with a simple table to help with this. If you are using the spirometer at home, follow these instructions: [place] IF:  You are having difficultly using the spirometer. You have trouble using the spirometer as often as instructed. Your pain  medication is not giving enough relief while using the spirometer. You develop fever of 100.5 F (38.1 C) or higher. SEEK IMMEDIATE MEDICAL  CARE IF:  You cough up bloody sputum that had not been present before. You develop fever of 102 F (38.9 C) or greater. You develop worsening pain at or near the incision site. MAKE SURE YOU:  Understand these instructions. Will watch your condition. Will get help right away if you are not doing well or get worse. Document Released: 10/23/2006 Document Revised: 09/04/2011 Document Reviewed: 12/24/2006 ExitCare Patient Information 2014 [employer], [place].   ________________________________________________________________________  WHAT IS A BLOOD TRANSFUSION? Blood Transfusion Information  A transfusion is the replacement of blood or some of its parts. Blood is made up of multiple cells which provide different functions. Red blood cells carry oxygen and are used for blood loss replacement. White blood cells fight against infection. Platelets control bleeding. Plasma helps clot blood. Other blood products are available for specialized needs, such as hemophilia or other clotting disorders. BEFORE THE TRANSFUSION  Who gives blood for transfusions?  Healthy volunteers who are fully evaluated to make sure their blood is safe. This is blood bank blood. Transfusion therapy is the safest it has ever been in the practice of medicine. Before blood is taken from a donor, a complete history is taken to make sure that person has no history of diseases nor engages in risky social behavior (examples are intravenous drug use or sexual activity with multiple partners). The donor's travel history is screened to minimize risk of transmitting infections, such as malaria. The donated blood is tested for signs of infectious diseases, such as HIV and hepatitis. The blood is then tested to be sure it is compatible with you in order to minimize the chance of a transfusion reaction. If you or a relative donates blood, this is often done in anticipation of surgery and is not appropriate for emergency situations. It takes  many days to process the donated blood. RISKS AND COMPLICATIONS Although transfusion therapy is very safe and saves many lives, the main dangers of transfusion include:  Getting an infectious disease. Developing a transfusion reaction. This is an allergic reaction to something in the blood you were given. Every precaution is taken to prevent this. The decision to have a blood transfusion has been considered carefully by your caregiver before blood is given. Blood is not given unless the benefits outweigh the risks. AFTER THE TRANSFUSION Right after receiving a blood transfusion, you will usually feel much better and more energetic. This is especially true if your red blood cells have gotten low (anemic). The transfusion raises the level of the red blood cells which carry oxygen, and this usually causes an energy increase. The nurse administering the transfusion will monitor you carefully for complications. HOME CARE INSTRUCTIONS  No special instructions are needed after a transfusion. You may find your energy is better. Speak with your caregiver about any limitations on activity for underlying diseases you may have. SEEK MEDICAL CARE IF:  Your condition is not improving after your transfusion. You develop redness or irritation at the intravenous (IV) site. SEEK IMMEDIATE MEDICAL CARE IF:  Any of the following symptoms occur over the next 12 hours: Shaking chills. You have a temperature by mouth above 102 F (38.9 C), not controlled by medicine. Chest, back, or muscle pain. People around you feel you are not acting correctly or are confused. Shortness of breath or difficulty breathing. Dizziness and fainting. You get a rash or  develop hives. You have a decrease in urine output. Your urine turns a dark color or changes to pink, red, or brown. Any of the following symptoms occur over the next 10 days: You have a temperature by mouth above 102 F (38.9 C), not controlled by  medicine. Shortness of breath. Weakness after normal activity. The white part of the eye turns yellow (jaundice). You have a decrease in the amount of urine or are urinating less often. Your urine turns a dark color or changes to pink, red, or brown. Document Released: 06/09/2000 Document Revised: 09/04/2011 Document Reviewed: 01/27/2008 [facility] Patient Information 2014 [employer], [place].  _______________________________________________________________________

## 2021-01-17 ENCOUNTER — Telehealth: Payer: Self-pay

## 2021-01-17 NOTE — Telephone Encounter (Signed)
   [facility] Pre-operative Risk Assessment    Patient Name: Amy Moon  DOB: 1951-05-17 MRN: [id number]  HEARTCARE STAFF:  - IMPORTANT!!!!!! Under Visit Info/Reason for Call, type in Other and utilize the format Clearance MM/DD/YY or Clearance TBD. Do not use dashes or single digits. - Please review there is not already an duplicate clearance open for this procedure. - If request is for dental extraction, please clarify the # of teeth to be extracted. - If the patient is currently at the dentist's office, call Pre-Op Callback Staff (MA/nurse) to input urgent request.  - If the patient is not currently in the dentist office, please route to the Pre-Op pool.  Request for surgical clearance:  What type of surgery is being performed? Left total knee arthroplasty  When is this surgery scheduled? 01/21/21  What type of clearance is required (medical clearance vs. Pharmacy clearance to hold med vs. Both)? Both  Are there any medications that need to be held prior to surgery and how long? Plavix  Practice name and name of physician performing surgery? [facility]  What is the office phone number? [phone]   7.   What is the office fax number? [phone] Attention Sherri  8.   Anesthesia type (None, local, MAC, general) ? Spinal   Lowella Grip 01/17/2021, 4:06 PM  _________________________________________________________________   (provider comments below)

## 2021-01-17 NOTE — Progress Notes (Addendum)
Anesthesia Chart Review   Case: [id number] Date/Time: 01/21/21 0815   Procedure: LEFT TOTAL KNEE ARTHROPLASTY (Left: Knee) - Need RNFA   Anesthesia type: Spinal   Pre-op diagnosis: Osteomyelitis Left Knee   Location: [facility] / [facility]   Surgeons: Mcarthur Rossetti, MD       DISCUSSION:70 y.o. former smoker with h/o PONV, HTN, GERD, CKD Stage III, cirrhosis, CAD, PVD, left knee OA scheduled for above procedure 01/21/2021 with Dr. Jean Rosenthal.   Last dose of Plavix 01/13/21.   Pt last seen by cardiology 10/01/2020. Cardiac clearance requested.  Addendum 01/19/2021:  Per cardiology preoperative evaluation 01/18/2021, "Chart reviewed as part of pre-operative protocol coverage. Given past medical history and time since last visit, based on ACC/AHA guidelines, ALEYSIA PLOTNICK would be at acceptable risk for the planned procedure without further cardiovascular testing.    Her Plavix may be held for 5 to 7 days prior to her surgery.  Please resume as soon as hemostasis is achieved."  VS: BP 118/67   Pulse 87   Temp 36.8 C (Oral)   Resp 16   Ht 5' (1.524 m)   Wt 74.3 kg   SpO2 97%   BMI 31.99 kg/m   PROVIDERS: Lorrene Reid, PA-C is PCP   Tristan Schroeder, MD is Cardiologist  LABS: Labs reviewed: Acceptable for surgery. (all labs ordered are listed, but only abnormal results are displayed)  Labs Reviewed  CBC - Abnormal; Notable for the following components:      Result Value   Platelets 139 (*)    All other components within normal limits  COMPREHENSIVE METABOLIC PANEL - Abnormal; Notable for the following components:   Potassium 3.2 (*)    All other components within normal limits  SURGICAL PCR SCREEN  TYPE AND SCREEN     IMAGES:   EKG: 10/04/20 Rate 82 bpm  NSR  CV: Stress Test 11/26/2017   Nuclear stress EF: 77%. There was no ST segment deviation noted during stress. This is a low risk study. The left ventricular ejection fraction is  hyperdynamic (>65%).   1. EF 77%, normal wall motion. 2. Fixed small, mild basal to mid inferior perfusion defect.  Given normal wall motion, this may be soft tissue attenuation.  No evidence for ischemia.   Low risk study.  Echo 10/24/2019  1. Left ventricular ejection fraction, by estimation, is 60 to 65%. The  left ventricle has normal function. The left ventricle has no regional  wall motion abnormalities. There is mild concentric left ventricular  hypertrophy. Left ventricular diastolic  parameters are consistent with Grade I diastolic dysfunction (impaired  relaxation).   2. Right ventricular systolic function is normal. The right ventricular  size is normal. There is normal pulmonary artery systolic pressure.   3. The mitral valve is normal in structure. No evidence of mitral valve  regurgitation. No evidence of mitral stenosis.   4. The aortic valve is normal in structure. Aortic valve regurgitation is  not visualized. No aortic stenosis is present.   5. The inferior vena cava is normal in size with greater than 50%  respiratory variability, suggesting right atrial pressure of 3 mmHg.  Past Medical History:  Diagnosis Date   Allergy    Anemia    Angina pectoris ([place])    Arthritis    Asthma    years ago   Bronchitis 06/03/2018   Cancer (HCC)    squamous cell carcinoma, nose   Cataract    Chest  pain 07/30/2017   Chronic kidney disease    possibly per pt   Chronic pain syndrome 07/30/2017   Cirrhosis (HCC)    Clotting disorder (HCC)    platelets are low   Coronary artery disease 04/02/2020   Depression    Drug-induced low platelet count    Duodenal ulcer    Dyslipidemia 04/02/2020   Dysphagia 09/03/2017   Dyspnea on exertion 10/17/2019   Encounter for hepatitis C virus screening test for high risk patient 11/05/2017   Encounter for Medicare annual wellness exam 05/09/2018   Enlarged thyroid    Family history of adverse reaction to anesthesia    father had  difficulty waking up and breathing on his own after surgery   GERD (gastroesophageal reflux disease)    Granulomatous disease ([place])    Healthcare maintenance 07/30/2017   Hepatic disease    Hiatal hernia    History of exposure to asbestos 10/21/2019   History of kidney stones    History of smoking 25-50 pack years- quit age 52, smoked 2ppd from 70 yo-70 yo. 10/21/2019   History of surgery- pt has several Sx- no personal issues wiht anesthesia  10/21/2019   Hypertension    Irritant contact dermatitis 08/25/2015   Low vitamin D level    Migraine    Nausea 03/01/2016   Neuropathy    Osteopenia    Osteoporosis    Parkinson's disease ([place])    Peripheral vascular disease ([place])    Pneumonia    PONV (postoperative nausea and vomiting)    Portal hypertension (HCC)    Severe episode of recurrent major depressive disorder, without psychotic features ([place]) 05/15/2016   Severe obesity (BMI >= 40) ([place]) 05/15/2016   Sleep disturbance 10/21/2019   Splenomegaly a   Stage 3 chronic kidney disease ([place]) 10/21/2019   Thrombocytopenia ([place]) 09/03/2017   Tremor of both hands 05/15/2016   Vitamin D deficiency 11/05/2017    Past Surgical History:  Procedure Laterality Date   ABDOMINAL HYSTERECTOMY     APPENDECTOMY     BACK SURGERY     thoracic-fractured    BACK SURGERY     lumbar - fractured   BREAST LUMPECTOMY WITH RADIOACTIVE SEED LOCALIZATION Right 08/01/2018   Procedure: RADIOACTIVE SEED GUIDED RIGHT BREAST LUMPECTOMY;  Surgeon: Coralie Keens, MD;  Location: [facility];  Service: General;  Laterality: Right;   COLONOSCOPY     ESOPHAGOGASTRODUODENOSCOPY  05/20/2008   Mild to moderate esophagitis. Otherwise normal EGD.    EYE SURGERY Left    cataract surgery with lens implant   GASTROSTOMY     peptic ulcer - peg tube placement    MEDICATIONS:  acetaminophen (TYLENOL) 650 MG CR tablet   atorvastatin (LIPITOR) 10 MG tablet   bisacodyl (DULCOLAX) 5 MG EC tablet   clopidogrel (PLAVIX)  75 MG tablet   cyclobenzaprine (FLEXERIL) 10 MG tablet   diphenhydrAMINE (BENADRYL) 25 MG tablet   furosemide (LASIX) 40 MG tablet   HYDROcodone-acetaminophen (NORCO/VICODIN) 5-325 MG tablet   losartan (COZAAR) 50 MG tablet   nitroGLYCERIN (NITROSTAT) 0.4 MG SL tablet   pantoprazole (PROTONIX) 40 MG tablet   potassium chloride (KLOR-CON) 10 MEQ tablet   pregabalin (LYRICA) 50 MG capsule   promethazine (PHENERGAN) 25 MG tablet   QUEtiapine (SEROQUEL) 25 MG tablet   ranolazine (RANEXA) 1000 MG SR tablet   No current facility-administered medications for this encounter.     Konrad Felix, PA-C [facility] Pre-Surgical Testing [phone]

## 2021-01-18 ENCOUNTER — Telehealth: Payer: Self-pay | Admitting: Specialist

## 2021-01-18 NOTE — Telephone Encounter (Signed)
This is pending in Dr. Otho Ket box.

## 2021-01-18 NOTE — Telephone Encounter (Signed)
Amy Moon 70 year old female is requesting preoperative cardiac evaluation for left total knee arthroplasty.  She was last seen in clinic on 10/01/2020.  During that time she was initiated on isosorbide.  It was felt that she may need cardiac catheterization if she did not respond well to medical therapy.  She underwent CT angio which did not show critical lesions.  It was felt that her chest discomfort may be related to her heart rate and possibly her liver cirrhosis.  Her PMH also includes dyslipidemia, hypertension, portal hypertension, osteopenia, obesity, and vitamin D deficiency.  May her Plavix be held prior to her procedure?  Would you like to see her for follow-up evaluation prior to procedure?  Thank you for your help.  Please direct response to [facility] preop pool.   Jossie Ng. Shaheim Mahar NP-C    01/18/2021, 9:16 AM [place] [address] Office [phone] Fax [phone]

## 2021-01-18 NOTE — Telephone Encounter (Signed)
   Primary Cardiologist: Jenne Campus, MD  Chart reviewed as part of pre-operative protocol coverage. Given past medical history and time since last visit, based on ACC/AHA guidelines, Amy Moon would be at acceptable risk for the planned procedure without further cardiovascular testing.   Her Plavix may be held for 5 to 7 days prior to her surgery.  Please resume as soon as hemostasis is achieved.  Patient was advised that if she develops new symptoms prior to surgery to contact our office to arrange a follow-up appointment.  She verbalized understanding.  I will route this recommendation to the requesting party via Epic fax function and remove from pre-op pool.  Please call with questions.  Jossie Ng. Atoya Andrew NP-C    01/18/2021, 2:25 PM [facility] [place] [address] Office [phone] Fax [phone]

## 2021-01-18 NOTE — Telephone Encounter (Signed)
Pt called requesting hydrocodone refill. Please send to pharmacy on file. Pt phone number is [phone].

## 2021-01-19 ENCOUNTER — Other Ambulatory Visit (HOSPITAL_COMMUNITY)
Admission: RE | Admit: 2021-01-19 | Discharge: 2021-01-19 | Disposition: A | Discharge status: Home / Self Care | Payer: Medicare Other | Source: Ambulatory Visit | Attending: Orthopaedic Surgery | Admitting: Orthopaedic Surgery

## 2021-01-19 ENCOUNTER — Telehealth: Payer: Self-pay | Admitting: Cardiology

## 2021-01-19 DIAGNOSIS — Z823 Family history of stroke: Secondary | ICD-10-CM | POA: Diagnosis not present

## 2021-01-19 DIAGNOSIS — G894 Chronic pain syndrome: Secondary | ICD-10-CM | POA: Diagnosis not present

## 2021-01-19 DIAGNOSIS — M810 Age-related osteoporosis without current pathological fracture: Secondary | ICD-10-CM | POA: Diagnosis not present

## 2021-01-19 DIAGNOSIS — E785 Hyperlipidemia, unspecified: Secondary | ICD-10-CM | POA: Diagnosis not present

## 2021-01-19 DIAGNOSIS — K766 Portal hypertension: Secondary | ICD-10-CM | POA: Diagnosis not present

## 2021-01-19 DIAGNOSIS — Z8711 Personal history of peptic ulcer disease: Secondary | ICD-10-CM | POA: Diagnosis not present

## 2021-01-19 DIAGNOSIS — Z6831 Body mass index (BMI) 31.0-31.9, adult: Secondary | ICD-10-CM | POA: Diagnosis not present

## 2021-01-19 DIAGNOSIS — G8918 Other acute postprocedural pain: Secondary | ICD-10-CM | POA: Diagnosis not present

## 2021-01-19 DIAGNOSIS — K219 Gastro-esophageal reflux disease without esophagitis: Secondary | ICD-10-CM | POA: Diagnosis not present

## 2021-01-19 DIAGNOSIS — Z83438 Family history of other disorder of lipoprotein metabolism and other lipidemia: Secondary | ICD-10-CM | POA: Diagnosis not present

## 2021-01-19 DIAGNOSIS — Z20822 Contact with and (suspected) exposure to covid-19: Secondary | ICD-10-CM | POA: Insufficient documentation

## 2021-01-19 DIAGNOSIS — D649 Anemia, unspecified: Secondary | ICD-10-CM | POA: Diagnosis not present

## 2021-01-19 DIAGNOSIS — M868X6 Other osteomyelitis, lower leg: Secondary | ICD-10-CM | POA: Diagnosis not present

## 2021-01-19 DIAGNOSIS — M869 Osteomyelitis, unspecified: Secondary | ICD-10-CM | POA: Diagnosis not present

## 2021-01-19 DIAGNOSIS — E669 Obesity, unspecified: Secondary | ICD-10-CM | POA: Diagnosis not present

## 2021-01-19 DIAGNOSIS — Z8249 Family history of ischemic heart disease and other diseases of the circulatory system: Secondary | ICD-10-CM | POA: Diagnosis not present

## 2021-01-19 DIAGNOSIS — Z01812 Encounter for preprocedural laboratory examination: Secondary | ICD-10-CM | POA: Insufficient documentation

## 2021-01-19 DIAGNOSIS — Z800 Family history of malignant neoplasm of digestive organs: Secondary | ICD-10-CM | POA: Diagnosis not present

## 2021-01-19 DIAGNOSIS — N183 Chronic kidney disease, stage 3 unspecified: Secondary | ICD-10-CM | POA: Diagnosis not present

## 2021-01-19 DIAGNOSIS — Z96652 Presence of left artificial knee joint: Secondary | ICD-10-CM | POA: Diagnosis not present

## 2021-01-19 DIAGNOSIS — M1711 Unilateral primary osteoarthritis, right knee: Secondary | ICD-10-CM | POA: Diagnosis not present

## 2021-01-19 DIAGNOSIS — E871 Hypo-osmolality and hyponatremia: Secondary | ICD-10-CM | POA: Diagnosis not present

## 2021-01-19 DIAGNOSIS — M1712 Unilateral primary osteoarthritis, left knee: Secondary | ICD-10-CM | POA: Diagnosis not present

## 2021-01-19 DIAGNOSIS — I129 Hypertensive chronic kidney disease with stage 1 through stage 4 chronic kidney disease, or unspecified chronic kidney disease: Secondary | ICD-10-CM | POA: Diagnosis not present

## 2021-01-19 DIAGNOSIS — Z803 Family history of malignant neoplasm of breast: Secondary | ICD-10-CM | POA: Diagnosis not present

## 2021-01-19 DIAGNOSIS — K7460 Unspecified cirrhosis of liver: Secondary | ICD-10-CM | POA: Diagnosis not present

## 2021-01-19 DIAGNOSIS — Z8261 Family history of arthritis: Secondary | ICD-10-CM | POA: Diagnosis not present

## 2021-01-19 DIAGNOSIS — Z820 Family history of epilepsy and other diseases of the nervous system: Secondary | ICD-10-CM | POA: Diagnosis not present

## 2021-01-19 DIAGNOSIS — J45909 Unspecified asthma, uncomplicated: Secondary | ICD-10-CM | POA: Diagnosis not present

## 2021-01-19 DIAGNOSIS — G629 Polyneuropathy, unspecified: Secondary | ICD-10-CM | POA: Diagnosis not present

## 2021-01-19 NOTE — Telephone Encounter (Signed)
Follow up:   Patient calling to speak with the nurse again.

## 2021-01-19 NOTE — Telephone Encounter (Signed)
Encounter not needed

## 2021-01-20 ENCOUNTER — Telehealth: Payer: Self-pay | Admitting: *Deleted

## 2021-01-20 LAB — SARS CORONAVIRUS 2 (TAT 6-24 HRS): SARS Coronavirus 2: NEGATIVE

## 2021-01-20 NOTE — Telephone Encounter (Signed)
Tried to reach the pt per pre op call back provider. Vm is full and could not leave a message to call back. Per pre op provider clearance notes were faxed on 01/18/21.

## 2021-01-20 NOTE — Progress Notes (Signed)
Pt aware to arrive at [facility] admitting at 0830 on Friday 01/21/2021 for scheduled surgical procedure. No food after midnight, clear liquids from midnight till 0800 then nothing by mouth.

## 2021-01-20 NOTE — Care Plan (Signed)
RNCM call to patient to discuss her upcoming Left total knee replacement with Dr. Ninfa Linden. Patient is an Ortho bundle patient and is agreeable to case management. She lives with her husband, who will be assisting her at home after discharge. She will need a FWW and 3in1/BSC. These will be ordered and provided at the hospital prior to discharge. Referral made to [facility]. HHPT referral made to [facility] after choice provided. Reviewed all post op care instructions. Will continue to follow for needs.

## 2021-01-20 NOTE — Anesthesia Preprocedure Evaluation (Addendum)
Anesthesia Evaluation  Patient identified by MRN, date of birth, ID band Patient awake    Reviewed: Allergy & Precautions, NPO status , Unable to perform ROS - Chart review only  Airway Mallampati: II  TM Distance: >3 FB Neck ROM: Full    Dental no notable dental hx. (+) Upper Dentures, Partial Upper,    Pulmonary asthma , former smoker,    Pulmonary exam normal breath sounds clear to auscultation       Cardiovascular hypertension, Pt. on medications + CAD and + Peripheral Vascular Disease  Normal cardiovascular exam Rhythm:Regular Rate:Normal  10/24/2019 Echo 1. Left ventricular ejection fraction, by estimation, is 60 to 65%. The  left ventricle has normal function. The left ventricle has no regional  wall motion abnormalities. There is mild concentric left ventricular  hypertrophy. Left ventricular diastolic  parameters are consistent with Grade I diastolic dysfunction (impaired  relaxation).  2. Right ventricular systolic function is normal. The right ventricular  size is normal. There is normal pulmonary artery systolic pressure.  3. The mitral valve is normal in structure. No evidence of mitral valve  regurgitation. No evidence of mitral stenosis.  4. The aortic valve is normal in structure. Aortic valve regurgitation is  not visualized. No aortic stenosis is present.  5. The inferior vena cava is normal in size with greater than 50%  respiratory variability, suggesting right atrial pressure of 3 mmHg.    Neuro/Psych    GI/Hepatic GERD  ,  Endo/Other    Renal/GU Renal InsufficiencyRenal diseaseLab Results      Component                Value               Date                      CREATININE               0.63                01/14/2021                BUN                      9                   01/14/2021                NA                       140                 01/14/2021                K                         3.2 (L)             01/14/2021                CL                       105                 01/14/2021                CO2  29                  01/14/2021                Musculoskeletal  (+) Arthritis ,   Abdominal (+) + obese (31.99),   Peds  Hematology Lab Results      Component                Value               Date                      WBC                      5.2                 01/14/2021                HGB                      12.8                01/14/2021                HCT                      37.8                01/14/2021                MCV                      93.8                01/14/2021                PLT                      139 (L)             01/14/2021              Anesthesia Other Findings All See list  Reproductive/Obstetrics                            Anesthesia Physical Anesthesia Plan  ASA: 3  Anesthesia Plan: Spinal   Post-op Pain Management:  Regional for Post-op pain   Induction: Intravenous  PONV Risk Score and Plan: 4 or greater and Treatment may vary due to age or medical condition, Ondansetron, Dexamethasone and Midazolam  Airway Management Planned: Natural Airway and Nasal Cannula  Additional Equipment: None  Intra-op Plan:   Post-operative Plan:   Informed Consent: I have reviewed the patients History and Physical, chart, labs and discussed the procedure including the risks, benefits and alternatives for the proposed anesthesia with the patient or authorized representative who has indicated his/her understanding and acceptance.     Dental advisory given  Plan Discussed with: CRNA  Anesthesia Plan Comments: (Spinal L Adductor)       Anesthesia Quick Evaluation

## 2021-01-20 NOTE — Telephone Encounter (Signed)
Ortho bundle pre-op call completed. 

## 2021-01-20 NOTE — H&P (Signed)
TOTAL KNEE ADMISSION H&P  Patient is being admitted for left total knee arthroplasty.  Subjective:  Chief Complaint:left knee pain.  HPI: Amy Moon, 70 y.o. female, has a history of pain and functional disability in the left knee due to arthritis and has failed non-surgical conservative treatments for greater than 12 weeks to includeNSAID's and/or analgesics, corticosteriod injections, viscosupplementation injections, flexibility and strengthening excercises, use of assistive devices, weight reduction as appropriate, and activity modification.  Onset of symptoms was gradual, starting 2 years ago with gradually worsening course since that time. The patient noted no past surgery on the left knee(s).  Patient currently rates pain in the left knee(s) at 10 out of 10 with activity. Patient has night pain, worsening of pain with activity and weight bearing, pain that interferes with activities of daily living, pain with passive range of motion, crepitus, and joint swelling.  Patient has evidence of subchondral sclerosis, periarticular osteophytes, and joint space narrowing by imaging studies. There is no active infection.  Patient Active Problem List   Diagnosis Date Noted   Unilateral primary osteoarthritis, left knee 11/30/2020   Dyslipidemia 04/02/2020   Splenomegaly    Portal hypertension (HCC)    Pneumonia    Osteoporosis    Osteopenia    Neuropathy    Low vitamin D level    History of kidney stones    Hiatal hernia    Hepatic disease    Granulomatous disease (HCC)    GERD (gastroesophageal reflux disease)    Enlarged thyroid    Duodenal ulcer    Drug-induced low platelet count    Depression    Clotting disorder (HCC)    Cirrhosis ([place])    Chronic kidney disease    Cataract    Cancer ([place])    Asthma    Arthritis    Angina pectoris ([place])    Anemia    Allergy    Stage 3 chronic kidney disease ([place]) 10/21/2019   History of smoking 25-50 pack years- quit age 40, smoked 2ppd  from 70 yo-70 yo. 10/21/2019   History of surgery- pt has several Sx- no personal issues wiht anesthesia  10/21/2019   Sleep disturbance 10/21/2019   History of exposure to asbestos 10/21/2019   Family history of adverse reaction to anesthesia    Dyspnea on exertion 10/17/2019   Encounter for Medicare annual wellness exam 05/09/2018   Vitamin D deficiency 11/05/2017   Encounter for hepatitis C virus screening test for high risk patient 11/05/2017   Dysphagia 09/03/2017   Thrombocytopenia ([place]) 09/03/2017   Healthcare maintenance 07/30/2017   Chronic pain syndrome 07/30/2017   Chest pain 07/30/2017   Parkinson's disease ([place]) 07/30/2017   Hypertension 07/30/2017   Severe episode of recurrent major depressive disorder, without psychotic features ([place]) 05/15/2016   Tremor of both hands 05/15/2016   Severe obesity (BMI >= 40) ([place]) 05/15/2016   Nausea 03/01/2016   Irritant contact dermatitis 08/25/2015   Past Medical History:  Diagnosis Date   Allergy    Anemia    Angina pectoris ([place])    Arthritis    Asthma    years ago   Bronchitis 06/03/2018   Cancer ([place])    squamous cell carcinoma, nose   Cataract    Chest pain 07/30/2017   Chronic kidney disease    possibly per pt   Chronic pain syndrome 07/30/2017   Cirrhosis (HCC)    Clotting disorder (HCC)    platelets are low   Coronary artery disease 04/02/2020  Depression    Drug-induced low platelet count    Duodenal ulcer    Dyslipidemia 04/02/2020   Dysphagia 09/03/2017   Dyspnea on exertion 10/17/2019   Encounter for hepatitis C virus screening test for high risk patient 11/05/2017   Encounter for Medicare annual wellness exam 05/09/2018   Enlarged thyroid    Family history of adverse reaction to anesthesia    father had difficulty waking up and breathing on his own after surgery   GERD (gastroesophageal reflux disease)    Granulomatous disease ([place])    Healthcare maintenance 07/30/2017   Hepatic disease    Hiatal  hernia    History of exposure to asbestos 10/21/2019   History of kidney stones    History of smoking 25-50 pack years- quit age 73, smoked 2ppd from 70 yo-70 yo. 10/21/2019   History of surgery- pt has several Sx- no personal issues wiht anesthesia  10/21/2019   Hypertension    Irritant contact dermatitis 08/25/2015   Low vitamin D level    Migraine    Nausea 03/01/2016   Neuropathy    Osteopenia    Osteoporosis    Parkinson's disease ([place])    Peripheral vascular disease ([place])    Pneumonia    PONV (postoperative nausea and vomiting)    Portal hypertension (HCC)    Severe episode of recurrent major depressive disorder, without psychotic features ([place]) 05/15/2016   Severe obesity (BMI >= 40) ([place]) 05/15/2016   Sleep disturbance 10/21/2019   Splenomegaly a   Stage 3 chronic kidney disease ([place]) 10/21/2019   Thrombocytopenia ([place]) 09/03/2017   Tremor of both hands 05/15/2016   Vitamin D deficiency 11/05/2017    Past Surgical History:  Procedure Laterality Date   ABDOMINAL HYSTERECTOMY     APPENDECTOMY     BACK SURGERY     thoracic-fractured    BACK SURGERY     lumbar - fractured   BREAST LUMPECTOMY WITH RADIOACTIVE SEED LOCALIZATION Right 08/01/2018   Procedure: RADIOACTIVE SEED GUIDED RIGHT BREAST LUMPECTOMY;  Surgeon: Coralie Keens, MD;  Location: [facility];  Service: General;  Laterality: Right;   COLONOSCOPY     ESOPHAGOGASTRODUODENOSCOPY  05/20/2008   Mild to moderate esophagitis. Otherwise normal EGD.    EYE SURGERY Left    cataract surgery with lens implant   GASTROSTOMY     peptic ulcer - peg tube placement    No current facility-administered medications for this encounter.   Current Outpatient Medications  Medication Sig Dispense Refill Last Dose   acetaminophen (TYLENOL) 650 MG CR tablet Take 650 mg by mouth every 6 (six) hours as needed for pain.      atorvastatin (LIPITOR) 10 MG tablet TAKE 1 TABLET (10 MG TOTAL) BY MOUTH DAILY. 90 tablet 1    bisacodyl  (DULCOLAX) 5 MG EC tablet Take 15 mg by mouth at bedtime as needed for moderate constipation.      clopidogrel (PLAVIX) 75 MG tablet Take 1 tablet (75 mg total) by mouth daily. 90 tablet 3    cyclobenzaprine (FLEXERIL) 10 MG tablet TAKE 1 TABLET BY MOUTH EVERY 8 HOURS AS NEEDED (Patient taking differently: Take 10 mg by mouth every 8 (eight) hours as needed for muscle spasms.) 30 tablet 0    diphenhydrAMINE (BENADRYL) 25 MG tablet Take 25 mg by mouth every 6 (six) hours as needed for allergies.      furosemide (LASIX) 40 MG tablet Take 1 tablet (40 mg total) by mouth daily. 90 tablet 2  losartan (COZAAR) 50 MG tablet TAKE 1 TABLET (50 MG TOTAL) BY MOUTH DAILY. 90 tablet 0    nitroGLYCERIN (NITROSTAT) 0.4 MG SL tablet Place 0.4 mg under the tongue every 5 (five) minutes as needed for chest pain.       pantoprazole (PROTONIX) 40 MG tablet TAKE 1 TABLET BY MOUTH DAILY. (Patient taking differently: Take 40 mg by mouth daily.) 30 tablet 6    potassium chloride (KLOR-CON) 10 MEQ tablet TAKE 1 TABLET (10 MEQ TOTAL) BY MOUTH DAILY. 90 tablet 1    pregabalin (LYRICA) 50 MG capsule TAKE 1 CAPSULE BY MOUTH 3 TIMES DAILY. (Patient taking differently: Take 50 mg by mouth 3 (three) times daily.) 90 capsule 0    promethazine (PHENERGAN) 25 MG tablet TAKE 1 TABLET BY MOUTH 4 TIMES DAILY AS NEEDED FOR NAUSEA OR VOMITING. (Patient taking differently: Take 25 mg by mouth 4 (four) times daily as needed for nausea or vomiting.) 45 tablet 0    ranolazine (RANEXA) 1000 MG SR tablet TAKE 1 TABLET BY MOUTH 2 TIMES DAILY. (Patient taking differently: Take 500 mg by mouth 2 (two) times daily.) 60 tablet 11    HYDROcodone-acetaminophen (NORCO/VICODIN) 5-325 MG tablet Take 1 tablet by mouth every 4 (four) hours as needed for moderate pain or severe pain. 30 tablet 0    QUEtiapine (SEROQUEL) 25 MG tablet Take 1 tablet (25 mg total) by mouth at bedtime. 30 tablet 1    Allergies  Allergen Reactions   Iodinated Diagnostic Agents  Hives   Nsaids Other (See Comments)    History of bleeding ulcers   Latex Hives and Rash   Sinemet [Carbidopa W-Levodopa] Nausea And Vomiting   Inderal [Propranolol] Other (See Comments)    Unknown   Indomethacin Other (See Comments)    Unknown   Penicillin G Other (See Comments)    Unknown   Pneumococcal Vaccines Other (See Comments)    Caused "pneumonia"   Scopolamine Other (See Comments)    The patch, caused her to pass out / change in mental status   Tape Other (See Comments)    Unknown    Social History   Tobacco Use   Smoking status: Former    Packs/day: 1.00    Years: 22.00    Pack years: 22.00    Types: Cigarettes    Quit date: 06/26/1986    Years since quitting: 34.5   Smokeless tobacco: Never  Substance Use Topics   Alcohol use: No    Family History  Problem Relation Age of Onset   Cancer Mother        ovarian   Hyperlipidemia Mother    Alzheimer's disease Mother    Arthritis Mother        rheumatoid   Cancer Father        lung   Parkinson's disease Father    Alzheimer's disease Father    ALS Sister    Cancer Maternal Aunt        pancreatic, lung, liver,breast   Breast cancer Maternal Aunt    Cancer Maternal Uncle        pancreatic, liver, brain   Colon cancer Maternal Uncle    Heart attack Paternal Aunt    Stroke Paternal Aunt    Stomach cancer Paternal Aunt    Heart attack Paternal Uncle    Stroke Paternal Grandmother    Heart attack Paternal Grandfather    Arthritis Sister        rheumatoid   Huntington's disease Daughter  presumed inherited from father per patient   Colon cancer Maternal Uncle    Pancreatic cancer Maternal Uncle    Liver disease Maternal Uncle    Breast cancer Maternal Aunt    Breast cancer Maternal Aunt    Esophageal cancer Neg Hx    Rectal cancer Neg Hx      Review of Systems  Musculoskeletal:  Positive for gait problem and joint swelling.  All other systems reviewed and are  negative.  Objective:  Physical Exam Vitals reviewed.  Constitutional:      Appearance: Normal appearance.  HENT:     Head: Normocephalic and atraumatic.  Eyes:     Extraocular Movements: Extraocular movements intact.     Pupils: Pupils are equal, round, and reactive to light.  Cardiovascular:     Rate and Rhythm: Normal rate.  Pulmonary:     Effort: Pulmonary effort is normal.  Abdominal:     Palpations: Abdomen is soft.  Musculoskeletal:     Cervical back: Normal range of motion.     Left knee: Effusion, bony tenderness and crepitus present. Decreased range of motion. Tenderness present over the medial joint line, lateral joint line and patellar tendon. Abnormal alignment.  Neurological:     Mental Status: She is alert and oriented to person, place, and time.  Psychiatric:        Behavior: Behavior normal.    Vital signs in last 24 hours:    Labs:   Estimated body mass index is 31.99 kg/m as calculated from the following:   Height as of 01/14/21: 5' (1.524 m).   Weight as of 01/14/21: 74.3 kg.   Imaging Review Plain radiographs demonstrate severe degenerative joint disease of the left knee(s). The overall alignment ismild varus. The bone quality appears to be good for age and reported activity level.      Assessment/Plan:  End stage arthritis, left knee   The patient history, physical examination, clinical judgment of the provider and imaging studies are consistent with end stage degenerative joint disease of the left knee(s) and total knee arthroplasty is deemed medically necessary. The treatment options including medical management, injection therapy arthroscopy and arthroplasty were discussed at length. The risks and benefits of total knee arthroplasty were presented and reviewed. The risks due to aseptic loosening, infection, stiffness, patella tracking problems, thromboembolic complications and other imponderables were discussed. The patient acknowledged the  explanation, agreed to proceed with the plan and consent was signed. Patient is being admitted for inpatient treatment for surgery, pain control, PT, OT, prophylactic antibiotics, VTE prophylaxis, progressive ambulation and ADL's and discharge planning. The patient is planning to be discharged home with home health services

## 2021-01-20 NOTE — Telephone Encounter (Signed)
Patient's preoperative cardiac evaluation sent back on 01/18/2021.  Please call back patient to answer any questions she may have.  Thank you.  Jossie Ng. Airica Schwartzkopf NP-C    01/20/2021, 9:33 AM [address] Office [phone] Fax [phone]

## 2021-01-21 ENCOUNTER — Other Ambulatory Visit: Payer: Self-pay

## 2021-01-21 ENCOUNTER — Inpatient Hospital Stay (HOSPITAL_COMMUNITY)
Admission: RE | Admit: 2021-01-21 | Discharge: 2021-01-25 | DRG: 470 | Disposition: A | Discharge status: Home Health | Payer: Medicare Other | Source: Ambulatory Visit | Attending: Orthopaedic Surgery | Admitting: Orthopaedic Surgery

## 2021-01-21 ENCOUNTER — Ambulatory Visit (HOSPITAL_COMMUNITY): Payer: Medicare Other | Admitting: Physician Assistant

## 2021-01-21 ENCOUNTER — Encounter (HOSPITAL_COMMUNITY): Payer: Self-pay | Admitting: Orthopaedic Surgery

## 2021-01-21 ENCOUNTER — Ambulatory Visit (HOSPITAL_COMMUNITY): Payer: Medicare Other | Admitting: Anesthesiology

## 2021-01-21 ENCOUNTER — Encounter (HOSPITAL_COMMUNITY)
Admission: RE | Disposition: A | Discharge status: Home / Self Care | Payer: Self-pay | Source: Ambulatory Visit | Attending: Orthopaedic Surgery

## 2021-01-21 ENCOUNTER — Observation Stay (HOSPITAL_COMMUNITY): Payer: Medicare Other

## 2021-01-21 DIAGNOSIS — N183 Chronic kidney disease, stage 3 unspecified: Secondary | ICD-10-CM | POA: Diagnosis present

## 2021-01-21 DIAGNOSIS — Z803 Family history of malignant neoplasm of breast: Secondary | ICD-10-CM

## 2021-01-21 DIAGNOSIS — G8918 Other acute postprocedural pain: Secondary | ICD-10-CM | POA: Diagnosis not present

## 2021-01-21 DIAGNOSIS — K7460 Unspecified cirrhosis of liver: Secondary | ICD-10-CM | POA: Diagnosis present

## 2021-01-21 DIAGNOSIS — Z820 Family history of epilepsy and other diseases of the nervous system: Secondary | ICD-10-CM

## 2021-01-21 DIAGNOSIS — M810 Age-related osteoporosis without current pathological fracture: Secondary | ICD-10-CM | POA: Diagnosis present

## 2021-01-21 DIAGNOSIS — Z96652 Presence of left artificial knee joint: Secondary | ICD-10-CM

## 2021-01-21 DIAGNOSIS — Z8711 Personal history of peptic ulcer disease: Secondary | ICD-10-CM

## 2021-01-21 DIAGNOSIS — Z91048 Other nonmedicinal substance allergy status: Secondary | ICD-10-CM

## 2021-01-21 DIAGNOSIS — Z880 Allergy status to penicillin: Secondary | ICD-10-CM

## 2021-01-21 DIAGNOSIS — Z886 Allergy status to analgesic agent status: Secondary | ICD-10-CM

## 2021-01-21 DIAGNOSIS — Z823 Family history of stroke: Secondary | ICD-10-CM

## 2021-01-21 DIAGNOSIS — I129 Hypertensive chronic kidney disease with stage 1 through stage 4 chronic kidney disease, or unspecified chronic kidney disease: Secondary | ICD-10-CM | POA: Diagnosis not present

## 2021-01-21 DIAGNOSIS — Z83438 Family history of other disorder of lipoprotein metabolism and other lipidemia: Secondary | ICD-10-CM

## 2021-01-21 DIAGNOSIS — K219 Gastro-esophageal reflux disease without esophagitis: Secondary | ICD-10-CM | POA: Diagnosis present

## 2021-01-21 DIAGNOSIS — J45909 Unspecified asthma, uncomplicated: Secondary | ICD-10-CM | POA: Diagnosis present

## 2021-01-21 DIAGNOSIS — G629 Polyneuropathy, unspecified: Secondary | ICD-10-CM | POA: Diagnosis present

## 2021-01-21 DIAGNOSIS — M1712 Unilateral primary osteoarthritis, left knee: Principal | ICD-10-CM

## 2021-01-21 DIAGNOSIS — Z8261 Family history of arthritis: Secondary | ICD-10-CM

## 2021-01-21 DIAGNOSIS — E669 Obesity, unspecified: Secondary | ICD-10-CM | POA: Diagnosis present

## 2021-01-21 DIAGNOSIS — Z887 Allergy status to serum and vaccine status: Secondary | ICD-10-CM

## 2021-01-21 DIAGNOSIS — Z20822 Contact with and (suspected) exposure to covid-19: Secondary | ICD-10-CM | POA: Diagnosis present

## 2021-01-21 DIAGNOSIS — Z800 Family history of malignant neoplasm of digestive organs: Secondary | ICD-10-CM

## 2021-01-21 DIAGNOSIS — G894 Chronic pain syndrome: Secondary | ICD-10-CM | POA: Diagnosis present

## 2021-01-21 DIAGNOSIS — Z6831 Body mass index (BMI) 31.0-31.9, adult: Secondary | ICD-10-CM

## 2021-01-21 DIAGNOSIS — M868X6 Other osteomyelitis, lower leg: Secondary | ICD-10-CM | POA: Diagnosis present

## 2021-01-21 DIAGNOSIS — Z87891 Personal history of nicotine dependence: Secondary | ICD-10-CM

## 2021-01-21 DIAGNOSIS — D649 Anemia, unspecified: Secondary | ICD-10-CM | POA: Diagnosis present

## 2021-01-21 DIAGNOSIS — Z91040 Latex allergy status: Secondary | ICD-10-CM

## 2021-01-21 DIAGNOSIS — M869 Osteomyelitis, unspecified: Secondary | ICD-10-CM | POA: Diagnosis not present

## 2021-01-21 DIAGNOSIS — E871 Hypo-osmolality and hyponatremia: Secondary | ICD-10-CM | POA: Diagnosis present

## 2021-01-21 DIAGNOSIS — Z888 Allergy status to other drugs, medicaments and biological substances status: Secondary | ICD-10-CM

## 2021-01-21 DIAGNOSIS — E785 Hyperlipidemia, unspecified: Secondary | ICD-10-CM | POA: Diagnosis present

## 2021-01-21 DIAGNOSIS — Z79899 Other long term (current) drug therapy: Secondary | ICD-10-CM

## 2021-01-21 DIAGNOSIS — Z8249 Family history of ischemic heart disease and other diseases of the circulatory system: Secondary | ICD-10-CM

## 2021-01-21 DIAGNOSIS — K766 Portal hypertension: Secondary | ICD-10-CM | POA: Diagnosis present

## 2021-01-21 DIAGNOSIS — M1711 Unilateral primary osteoarthritis, right knee: Secondary | ICD-10-CM | POA: Diagnosis not present

## 2021-01-21 HISTORY — PX: TOTAL KNEE ARTHROPLASTY: SHX125

## 2021-01-21 HISTORY — DX: Presence of left artificial knee joint: Z96.652

## 2021-01-21 LAB — TYPE AND SCREEN
ABO/RH(D): O POS
Antibody Screen: NEGATIVE

## 2021-01-21 LAB — ABO/RH: ABO/RH(D): O POS

## 2021-01-21 SURGERY — ARTHROPLASTY, KNEE, TOTAL
Anesthesia: Spinal | Site: Knee | Laterality: Left

## 2021-01-21 MED ORDER — ORAL CARE MOUTH RINSE: 15.0000 mL | Freq: Once | OROMUCOSAL | Status: AC

## 2021-01-21 MED ORDER — ALBUMIN HUMAN 5 % IV SOLN
12.5000 g | Freq: Once | INTRAVENOUS | Status: AC
  Administered 2021-01-21: 12.5 g via INTRAVENOUS

## 2021-01-21 MED ORDER — CLINDAMYCIN PHOSPHATE 600 MG/50ML IV SOLN
600.0000 mg | Freq: Four times a day (QID) | INTRAVENOUS | Status: AC
  Administered 2021-01-21 – 2021-01-22 (×2): 600 mg via INTRAVENOUS
  Filled 2021-01-21 (×2): qty 50

## 2021-01-21 MED ORDER — ATORVASTATIN CALCIUM 10 MG PO TABS
10.0000 mg | ORAL_TABLET | Freq: Every day | ORAL | Status: DC
  Administered 2021-01-22 – 2021-01-25 (×4): 10 mg via ORAL
  Filled 2021-01-21 (×4): qty 1

## 2021-01-21 MED ORDER — HYDROMORPHONE HCL 1 MG/ML IJ SOLN: 0.2500 mg | INTRAMUSCULAR | Status: DC | PRN

## 2021-01-21 MED ORDER — SODIUM CHLORIDE 0.9 % IR SOLN
Status: DC | PRN
  Administered 2021-01-21: 1000 mL

## 2021-01-21 MED ORDER — OXYCODONE HCL 5 MG PO TABS: 5.0000 mg | ORAL_TABLET | Freq: Once | ORAL | Status: DC | PRN

## 2021-01-21 MED ORDER — ONDANSETRON HCL 4 MG/2ML IJ SOLN
4.0000 mg | Freq: Once | INTRAMUSCULAR | Status: AC | PRN
  Administered 2021-01-21: 4 mg via INTRAVENOUS

## 2021-01-21 MED ORDER — OXYCODONE HCL 5 MG PO TABS
5.0000 mg | ORAL_TABLET | ORAL | Status: DC | PRN
  Administered 2021-01-21 – 2021-01-23 (×2): 10 mg via ORAL
  Administered 2021-01-23: 5 mg via ORAL
  Administered 2021-01-23: 10 mg via ORAL
  Filled 2021-01-21: qty 2
  Filled 2021-01-21: qty 1
  Filled 2021-01-21 (×2): qty 2
  Filled 2021-01-21 (×2): qty 1

## 2021-01-21 MED ORDER — HYDROMORPHONE HCL 1 MG/ML IJ SOLN
0.5000 mg | INTRAMUSCULAR | Status: DC | PRN
  Administered 2021-01-21 – 2021-01-22 (×4): 1 mg via INTRAVENOUS
  Filled 2021-01-21 (×5): qty 1

## 2021-01-21 MED ORDER — LOSARTAN POTASSIUM 50 MG PO TABS
50.0000 mg | ORAL_TABLET | Freq: Every day | ORAL | Status: DC
  Administered 2021-01-21 – 2021-01-24 (×3): 50 mg via ORAL
  Filled 2021-01-21 (×6): qty 1

## 2021-01-21 MED ORDER — SODIUM CHLORIDE 0.9 % IV SOLN: INTRAVENOUS | Status: DC

## 2021-01-21 MED ORDER — POVIDONE-IODINE 10 % EX SWAB: 2.0000 "application " | Freq: Once | CUTANEOUS | Status: DC

## 2021-01-21 MED ORDER — QUETIAPINE FUMARATE 25 MG PO TABS
25.0000 mg | ORAL_TABLET | Freq: Every day | ORAL | Status: DC
  Administered 2021-01-21 – 2021-01-24 (×4): 25 mg via ORAL
  Filled 2021-01-21 (×4): qty 1

## 2021-01-21 MED ORDER — DOCUSATE SODIUM 100 MG PO CAPS
100.0000 mg | ORAL_CAPSULE | Freq: Two times a day (BID) | ORAL | Status: DC
  Administered 2021-01-21 – 2021-01-25 (×8): 100 mg via ORAL
  Filled 2021-01-21 (×8): qty 1

## 2021-01-21 MED ORDER — RANOLAZINE ER 500 MG PO TB12: 500.0000 mg | ORAL_TABLET | Freq: Two times a day (BID) | ORAL | Status: DC

## 2021-01-21 MED ORDER — MENTHOL 3 MG MT LOZG: 1.0000 | LOZENGE | OROMUCOSAL | Status: DC | PRN

## 2021-01-21 MED ORDER — 0.9 % SODIUM CHLORIDE (POUR BTL) OPTIME
TOPICAL | Status: DC | PRN
  Administered 2021-01-21: 1000 mL

## 2021-01-21 MED ORDER — PANTOPRAZOLE SODIUM 40 MG PO TBEC
40.0000 mg | DELAYED_RELEASE_TABLET | Freq: Every day | ORAL | Status: DC
  Administered 2021-01-21 – 2021-01-25 (×5): 40 mg via ORAL
  Filled 2021-01-21 (×5): qty 1

## 2021-01-21 MED ORDER — TRANEXAMIC ACID-NACL 1000-0.7 MG/100ML-% IV SOLN
1000.0000 mg | INTRAVENOUS | Status: AC
  Administered 2021-01-21: 1000 mg via INTRAVENOUS
  Filled 2021-01-21: qty 100

## 2021-01-21 MED ORDER — ONDANSETRON HCL 4 MG/2ML IJ SOLN
INTRAMUSCULAR | Status: DC | PRN
  Administered 2021-01-21: 4 mg via INTRAVENOUS

## 2021-01-21 MED ORDER — PREGABALIN 50 MG PO CAPS
50.0000 mg | ORAL_CAPSULE | Freq: Three times a day (TID) | ORAL | Status: DC
  Administered 2021-01-21 – 2021-01-25 (×13): 50 mg via ORAL
  Filled 2021-01-21 (×13): qty 1

## 2021-01-21 MED ORDER — PHENYLEPHRINE HCL (PRESSORS) 10 MG/ML IV SOLN
INTRAVENOUS | Status: AC
  Filled 2021-01-21: qty 1

## 2021-01-21 MED ORDER — METHOCARBAMOL 500 MG PO TABS
500.0000 mg | ORAL_TABLET | Freq: Four times a day (QID) | ORAL | Status: DC | PRN
  Administered 2021-01-21 – 2021-01-24 (×5): 500 mg via ORAL
  Filled 2021-01-21 (×5): qty 1

## 2021-01-21 MED ORDER — METOCLOPRAMIDE HCL 5 MG PO TABS: 5.0000 mg | ORAL_TABLET | Freq: Three times a day (TID) | ORAL | Status: DC | PRN

## 2021-01-21 MED ORDER — ACETAMINOPHEN 10 MG/ML IV SOLN: 1000.0000 mg | Freq: Once | INTRAVENOUS | Status: DC | PRN

## 2021-01-21 MED ORDER — METHOCARBAMOL 500 MG IVPB - SIMPLE MED
500.0000 mg | Freq: Four times a day (QID) | INTRAVENOUS | Status: DC | PRN
  Filled 2021-01-21: qty 50

## 2021-01-21 MED ORDER — PHENYLEPHRINE 40 MCG/ML (10ML) SYRINGE FOR IV PUSH (FOR BLOOD PRESSURE SUPPORT)
PREFILLED_SYRINGE | INTRAVENOUS | Status: DC | PRN
  Administered 2021-01-21: 80 ug via INTRAVENOUS

## 2021-01-21 MED ORDER — ONDANSETRON HCL 4 MG PO TABS: 4.0000 mg | ORAL_TABLET | Freq: Four times a day (QID) | ORAL | Status: DC | PRN

## 2021-01-21 MED ORDER — OXYCODONE HCL 5 MG/5ML PO SOLN: 5.0000 mg | Freq: Once | ORAL | Status: DC | PRN

## 2021-01-21 MED ORDER — ALUM & MAG HYDROXIDE-SIMETH 200-200-20 MG/5ML PO SUSP: 30.0000 mL | ORAL | Status: DC | PRN

## 2021-01-21 MED ORDER — RANOLAZINE ER 500 MG PO TB12
500.0000 mg | ORAL_TABLET | Freq: Two times a day (BID) | ORAL | Status: DC
  Administered 2021-01-21 – 2021-01-25 (×8): 500 mg via ORAL
  Filled 2021-01-21 (×8): qty 1

## 2021-01-21 MED ORDER — CLONIDINE HCL (ANALGESIA) 100 MCG/ML EP SOLN
EPIDURAL | Status: DC | PRN
  Administered 2021-01-21: 100 ug

## 2021-01-21 MED ORDER — ONDANSETRON HCL 4 MG/2ML IJ SOLN: 4.0000 mg | Freq: Four times a day (QID) | INTRAMUSCULAR | Status: DC | PRN

## 2021-01-21 MED ORDER — BUPIVACAINE IN DEXTROSE 0.75-8.25 % IT SOLN
INTRATHECAL | Status: DC | PRN
  Administered 2021-01-21: 12 mg via INTRATHECAL

## 2021-01-21 MED ORDER — POTASSIUM CHLORIDE ER 10 MEQ PO TBCR
10.0000 meq | EXTENDED_RELEASE_TABLET | Freq: Every day | ORAL | Status: DC
  Administered 2021-01-22 – 2021-01-24 (×3): 10 meq via ORAL
  Filled 2021-01-21 (×5): qty 1

## 2021-01-21 MED ORDER — ALBUMIN HUMAN 5 % IV SOLN
INTRAVENOUS | Status: AC
  Filled 2021-01-21: qty 250

## 2021-01-21 MED ORDER — CLOPIDOGREL BISULFATE 75 MG PO TABS
75.0000 mg | ORAL_TABLET | Freq: Every day | ORAL | Status: DC
  Administered 2021-01-21 – 2021-01-25 (×5): 75 mg via ORAL
  Filled 2021-01-21 (×5): qty 1

## 2021-01-21 MED ORDER — FUROSEMIDE 40 MG PO TABS
40.0000 mg | ORAL_TABLET | Freq: Every day | ORAL | Status: DC
  Administered 2021-01-21 – 2021-01-25 (×5): 40 mg via ORAL
  Filled 2021-01-21 (×5): qty 1

## 2021-01-21 MED ORDER — PHENYLEPHRINE 40 MCG/ML (10ML) SYRINGE FOR IV PUSH (FOR BLOOD PRESSURE SUPPORT)
PREFILLED_SYRINGE | INTRAVENOUS | Status: AC
  Filled 2021-01-21: qty 10

## 2021-01-21 MED ORDER — PHENYLEPHRINE HCL-NACL 10-0.9 MG/250ML-% IV SOLN
INTRAVENOUS | Status: DC | PRN
  Administered 2021-01-21: 35 ug/min via INTRAVENOUS

## 2021-01-21 MED ORDER — CLINDAMYCIN PHOSPHATE 900 MG/50ML IV SOLN
900.0000 mg | INTRAVENOUS | Status: AC
  Administered 2021-01-21: 900 mg via INTRAVENOUS
  Filled 2021-01-21: qty 50

## 2021-01-21 MED ORDER — OXYCODONE HCL 5 MG PO TABS
10.0000 mg | ORAL_TABLET | ORAL | Status: DC | PRN
  Administered 2021-01-21 – 2021-01-22 (×4): 10 mg via ORAL
  Administered 2021-01-23: 15 mg via ORAL
  Administered 2021-01-24 (×2): 10 mg via ORAL
  Filled 2021-01-21 (×4): qty 2
  Filled 2021-01-21: qty 3
  Filled 2021-01-21: qty 2

## 2021-01-21 MED ORDER — ASPIRIN 81 MG PO CHEW
81.0000 mg | CHEWABLE_TABLET | Freq: Every day | ORAL | Status: DC
  Administered 2021-01-21 – 2021-01-25 (×5): 81 mg via ORAL
  Filled 2021-01-21 (×5): qty 1

## 2021-01-21 MED ORDER — STERILE WATER FOR IRRIGATION IR SOLN
Status: DC | PRN
  Administered 2021-01-21: 2000 mL

## 2021-01-21 MED ORDER — DIPHENHYDRAMINE HCL 12.5 MG/5ML PO ELIX
12.5000 mg | ORAL_SOLUTION | ORAL | Status: DC | PRN
  Administered 2021-01-21: 25 mg via ORAL
  Filled 2021-01-21: qty 10

## 2021-01-21 MED ORDER — ACETAMINOPHEN 325 MG PO TABS
325.0000 mg | ORAL_TABLET | Freq: Four times a day (QID) | ORAL | Status: DC | PRN
  Administered 2021-01-24 – 2021-01-25 (×3): 650 mg via ORAL
  Filled 2021-01-21 (×3): qty 2

## 2021-01-21 MED ORDER — FENTANYL CITRATE (PF) 100 MCG/2ML IJ SOLN
50.0000 ug | Freq: Once | INTRAMUSCULAR | Status: AC
Start: 2021-01-21 — End: 2021-01-21
  Administered 2021-01-21: 50 ug via INTRAVENOUS
  Filled 2021-01-21: qty 2

## 2021-01-21 MED ORDER — MIDAZOLAM HCL 2 MG/2ML IJ SOLN
1.0000 mg | Freq: Once | INTRAMUSCULAR | Status: AC
  Administered 2021-01-21: 1 mg via INTRAVENOUS
  Filled 2021-01-21: qty 2

## 2021-01-21 MED ORDER — CHLORHEXIDINE GLUCONATE 0.12 % MT SOLN
15.0000 mL | Freq: Once | OROMUCOSAL | Status: AC
  Administered 2021-01-21: 15 mL via OROMUCOSAL

## 2021-01-21 MED ORDER — LACTATED RINGERS IV SOLN: INTRAVENOUS | Status: DC

## 2021-01-21 MED ORDER — AMISULPRIDE (ANTIEMETIC) 5 MG/2ML IV SOLN
INTRAVENOUS | Status: AC
  Filled 2021-01-21: qty 4

## 2021-01-21 MED ORDER — PROPOFOL 1000 MG/100ML IV EMUL
INTRAVENOUS | Status: AC
  Filled 2021-01-21: qty 100

## 2021-01-21 MED ORDER — ROPIVACAINE HCL 7.5 MG/ML IJ SOLN
INTRAMUSCULAR | Status: DC | PRN
  Administered 2021-01-21: 20 mL via PERINEURAL

## 2021-01-21 MED ORDER — METOCLOPRAMIDE HCL 5 MG/ML IJ SOLN: 5.0000 mg | Freq: Three times a day (TID) | INTRAMUSCULAR | Status: DC | PRN

## 2021-01-21 MED ORDER — ONDANSETRON HCL 4 MG/2ML IJ SOLN
INTRAMUSCULAR | Status: AC
  Filled 2021-01-21: qty 2

## 2021-01-21 MED ORDER — AMISULPRIDE (ANTIEMETIC) 5 MG/2ML IV SOLN
10.0000 mg | Freq: Once | INTRAVENOUS | Status: AC | PRN
  Administered 2021-01-21: 10 mg via INTRAVENOUS

## 2021-01-21 MED ORDER — PROPOFOL 10 MG/ML IV BOLUS
INTRAVENOUS | Status: DC | PRN
  Administered 2021-01-21: 10 mg via INTRAVENOUS

## 2021-01-21 MED ORDER — PROPOFOL 500 MG/50ML IV EMUL
INTRAVENOUS | Status: DC | PRN
  Administered 2021-01-21: 100 ug/kg/min via INTRAVENOUS

## 2021-01-21 MED ORDER — PHENOL 1.4 % MT LIQD: 1.0000 | OROMUCOSAL | Status: DC | PRN

## 2021-01-21 SURGICAL SUPPLY — 56 items
BAG COUNTER SPONGE SURGICOUNT (BAG) ×4 IMPLANT
BAG ZIPLOCK 12X15 (MISCELLANEOUS) IMPLANT
BENZOIN TINCTURE PRP APPL 2/3 (GAUZE/BANDAGES/DRESSINGS) IMPLANT
BLADE SAG 18X100X1.27 (BLADE) ×2 IMPLANT
BLADE SURG SZ10 CARB STEEL (BLADE) ×4 IMPLANT
BNDG ELASTIC 6X5.8 VLCR STR LF (GAUZE/BANDAGES/DRESSINGS) ×4 IMPLANT
BOWL SMART MIX CTS (DISPOSABLE) ×2 IMPLANT
CEMENT BONE R 1X40 (Cement) ×4 IMPLANT
COMP FEM CMT PS STD 5 LT (Joint) ×2 IMPLANT
COMPONENT FEM CMT PS STD 5 LT (Joint) ×1 IMPLANT
COOLER ICEMAN CLASSIC (MISCELLANEOUS) ×2 IMPLANT
COVER SURGICAL LIGHT HANDLE (MISCELLANEOUS) ×2 IMPLANT
CUFF TOURN SGL QUICK 34 (TOURNIQUET CUFF) ×2
CUFF TRNQT CYL 34X4.125X (TOURNIQUET CUFF) ×1 IMPLANT
DECANTER SPIKE VIAL GLASS SM (MISCELLANEOUS) IMPLANT
DRAPE U-SHAPE 47X51 STRL (DRAPES) ×2 IMPLANT
DRSG ADAPTIC 3X8 NADH LF (GAUZE/BANDAGES/DRESSINGS) ×2 IMPLANT
DRSG PAD ABDOMINAL 8X10 ST (GAUZE/BANDAGES/DRESSINGS) ×2 IMPLANT
ELECT BLADE TIP CTD 4 INCH (ELECTRODE) IMPLANT
ELECT REM PT RETURN 15FT ADLT (MISCELLANEOUS) ×2 IMPLANT
GAUZE SPONGE 4X4 12PLY STRL (GAUZE/BANDAGES/DRESSINGS) ×2 IMPLANT
GAUZE XEROFORM 1X8 LF (GAUZE/BANDAGES/DRESSINGS) ×2 IMPLANT
GLOVE SRG 8 PF TXTR STRL LF DI (GLOVE) ×2 IMPLANT
GLOVE SURG ENC MOIS LTX SZ7.5 (GLOVE) ×2 IMPLANT
GLOVE SURG LTX SZ8 (GLOVE) ×2 IMPLANT
GLOVE SURG UNDER POLY LF SZ8 (GLOVE) ×4
GOWN STRL REUS W/TWL XL LVL3 (GOWN DISPOSABLE) ×4 IMPLANT
HANDPIECE INTERPULSE COAX TIP (DISPOSABLE) ×2
HDLS TROCR DRIL PIN KNEE 75 (PIN) ×2
HOLDER FOLEY CATH W/STRAP (MISCELLANEOUS) ×2 IMPLANT
IMMOBILIZER KNEE 20 (SOFTGOODS) ×2
IMMOBILIZER KNEE 20 THIGH 36 (SOFTGOODS) ×1 IMPLANT
INSERT COMP PS 16 4-5/CD LT (Insert) ×2 IMPLANT
KIT TURNOVER KIT A (KITS) ×2 IMPLANT
NS IRRIG 1000ML POUR BTL (IV SOLUTION) ×2 IMPLANT
PACK TOTAL KNEE CUSTOM (KITS) ×2 IMPLANT
PAD COLD SHLDR WRAP-ON (PAD) ×2 IMPLANT
PADDING CAST COTTON 6X4 STRL (CAST SUPPLIES) ×4 IMPLANT
PENCIL SMOKE EVACUATOR (MISCELLANEOUS) IMPLANT
PIN DRILL HDLS TROCAR 75 4PK (PIN) ×1 IMPLANT
PROTECTOR NERVE ULNAR (MISCELLANEOUS) ×2 IMPLANT
SCREW FEMALE HEX FIX 25X2.5 (ORTHOPEDIC DISPOSABLE SUPPLIES) ×2 IMPLANT
SET HNDPC FAN SPRY TIP SCT (DISPOSABLE) ×1 IMPLANT
SET PAD KNEE POSITIONER (MISCELLANEOUS) ×2 IMPLANT
STAPLER VISISTAT 35W (STAPLE) IMPLANT
STEM POLY PAT PLY 32M KNEE (Knees) ×2 IMPLANT
STEM TIBIA 5 DEG SZ D L KNEE (Knees) ×1 IMPLANT
STRIP CLOSURE SKIN 1/2X4 (GAUZE/BANDAGES/DRESSINGS) IMPLANT
SUT MNCRL AB 4-0 PS2 18 (SUTURE) IMPLANT
SUT VIC AB 0 CT1 27 (SUTURE) ×2
SUT VIC AB 0 CT1 27XBRD ANTBC (SUTURE) ×1 IMPLANT
SUT VIC AB 1 CT1 36 (SUTURE) ×4 IMPLANT
SUT VIC AB 2-0 CT1 27 (SUTURE) ×4
SUT VIC AB 2-0 CT1 TAPERPNT 27 (SUTURE) ×2 IMPLANT
TIBIA STEM 5 DEG SZ D L KNEE (Knees) ×2 IMPLANT
WATER STERILE IRR 1000ML POUR (IV SOLUTION) ×4 IMPLANT

## 2021-01-21 NOTE — Care Plan (Signed)
Ortho Bundle Case Management Note  Patient Details  Name: Amy Moon MRN: [id number] Date of Birth: [birth year]  RNCM call to patient to discuss her upcoming Left total knee replacement with Dr. Ninfa Linden. Patient is an Ortho bundle patient and is agreeable to case management. She lives with her husband, who will be assisting her at home after discharge. She will need a FWW and 3in1/BSC. These will be ordered and provided at the hospital prior to discharge. Referral made to [facility]. HHPT referral made to [facility] after choice provided. Reviewed all post op care instructions. Will continue to follow for needs.                   DME Arranged:  3-N-1, [place] rolling DME Agency:  [employer]  HH Arranged:  PT HH Agency:  [facility]  Additional Comments: Please contact me with any questions of if this plan should need to change.  Jamse Arn, RN, BSN, [employer]  [phone] 01/21/2021, 11:11 AM

## 2021-01-21 NOTE — Plan of Care (Signed)

## 2021-01-21 NOTE — Anesthesia Procedure Notes (Addendum)
Anesthesia Regional Block: Adductor canal block   Pre-Anesthetic Checklist: , timeout performed,  Correct Patient, Correct Site, Correct Laterality,  Correct Procedure, Correct Position, site marked,  Risks and benefits discussed,  Surgical consent,  Pre-op evaluation,  At surgeon's request and post-op pain management  Laterality: Lower and Left  Prep: chloraprep       Needles:  Injection technique: Single-shot  Needle Type: Echogenic Needle     Needle Length: 9cm  Needle Gauge: 22     Additional Needles:   Procedures:,,,, ultrasound used (permanent image in chart),,    Narrative:  Start time: 01/21/2021 11:17 AM End time: 01/21/2021 11:22 AM Injection made incrementally with aspirations every 5 mL.  Performed by: Personally  Anesthesiologist: Barnet Glasgow, MD  Additional Notes: Block assessed prior to surgery. Pt tolerated procedure well.

## 2021-01-21 NOTE — Transfer of Care (Signed)
Immediate Anesthesia Transfer of Care Note  Patient: Amy Moon  Procedure(s) Performed: Procedure(s) with comments: LEFT TOTAL KNEE ARTHROPLASTY (Left) - Need RNFA  Patient Location: PACU  Anesthesia Type:Spinal  Level of Consciousness: awake, alert  and oriented  Airway & Oxygen Therapy: Patient Spontanous Breathing  Post-op Assessment: Report given to RN and Post -op Vital signs reviewed and stable  Post vital signs: Reviewed and stable  Last Vitals:  Vitals:   01/21/21 1118 01/21/21 1130  BP: 124/63 118/74  Pulse: 97 97  Resp: 12 20  Temp:    SpO2: 0000000 [id number]    Complications: No apparent anesthesia complications

## 2021-01-21 NOTE — Anesthesia Postprocedure Evaluation (Signed)
Anesthesia Post Note  Patient: Amy Moon  Procedure(s) Performed: LEFT TOTAL KNEE ARTHROPLASTY (Left: Knee)     Patient location during evaluation: Nursing Unit Anesthesia Type: Spinal Level of consciousness: oriented and awake and alert Pain management: pain level controlled Vital Signs Assessment: post-procedure vital signs reviewed and stable Respiratory status: spontaneous breathing and respiratory function stable Cardiovascular status: blood pressure returned to baseline and stable Postop Assessment: no headache, no backache, no apparent nausea or vomiting and patient able to bend at knees Anesthetic complications: no   No notable events documented.  Last Vitals:  Vitals:   01/21/21 1600 01/21/21 1608  BP:  (!) 113/48  Pulse: 75 73  Resp: 12 17  Temp:  36.4 C  SpO2: 98% 100%    Last Pain:  Vitals:   01/21/21 1657  TempSrc:   PainSc: 5                  Barnet Glasgow

## 2021-01-21 NOTE — Progress Notes (Signed)
AssistedDr. Houser with left, ultrasound guided, adductor canal block. Side rails up, monitors on throughout procedure. See vital signs in flow sheet. Tolerated Procedure well.  

## 2021-01-21 NOTE — Interval H&P Note (Signed)
History and Physical Interval Note: The patient understands that she is here today for a left knee replacement to treat her left knee osteoarthritis.  The risks and benefits of surgery have been described in detail and informed consent is obtained.  There has been no acute or interval change in her medical status.  See recent H&P.  The left operative knee has been marked.  01/21/2021 10:53 AM  Amy Moon  has presented today for surgery, with the diagnosis of Osteomyelitis Left Knee.  The various methods of treatment have been discussed with the patient and family. After consideration of risks, benefits and other options for treatment, the patient has consented to  Procedure(s) with comments: LEFT TOTAL KNEE ARTHROPLASTY (Left) - Need RNFA as a surgical intervention.  The patient's history has been reviewed, patient examined, no change in status, stable for surgery.  I have reviewed the patient's chart and labs.  Questions were answered to the patient's satisfaction.     Mcarthur Rossetti

## 2021-01-21 NOTE — Brief Op Note (Signed)
01/21/2021  2:03 PM  PATIENT:  [name]  70 y.o. female  PRE-OPERATIVE DIAGNOSIS:  Osteomyelitis Left Knee  POST-OPERATIVE DIAGNOSIS:  Osteomyelitis Left Knee  PROCEDURE:  Procedure(s) with comments: LEFT TOTAL KNEE ARTHROPLASTY (Left) - Need RNFA  SURGEON:  Surgeon(s) and Role:    Mcarthur Rossetti, MD - Primary  ASSISTANTS: RNFA   ANESTHESIA:   regional and spinal  EBL:  70 mL   COUNTS:  YES  TOURNIQUET:   Total Tourniquet Time Documented: Thigh (Left) - 56 minutes Total: Thigh (Left) - 56 minutes   DICTATION: .Other Dictation: Dictation Number [id number]  PLAN OF CARE: Admit for overnight observation  PATIENT DISPOSITION:  PACU - hemodynamically stable.   Delay start of Pharmacological VTE agent (>24hrs) due to surgical blood loss or risk of bleeding: no

## 2021-01-21 NOTE — Anesthesia Procedure Notes (Signed)
Spinal  Patient location during procedure: OR Start time: 01/21/2021 12:12 PM End time: 01/21/2021 12:19 PM Reason for block: surgical anesthesia Staffing Anesthesiologist: Barnet Glasgow, MD Preanesthetic Checklist Completed: patient identified, IV checked, site marked, risks and benefits discussed, surgical consent, monitors and equipment checked, pre-op evaluation and timeout performed Spinal Block Patient position: sitting Prep: DuraPrep Patient monitoring: heart rate, cardiac monitor, continuous pulse ox and blood pressure Approach: midline Location: L3-4 Injection technique: single-shot Needle Needle type: Sprotte  Needle gauge: 24 G Needle length: 9 cm Needle insertion depth: 7 cm Assessment Sensory level: T4 Events: CSF return and second provider Additional Notes 2 attempts Pt tolerated procedure well

## 2021-01-21 NOTE — Anesthesia Procedure Notes (Signed)
Procedure Name: MAC Date/Time: 01/21/2021 12:08 PM Performed by: Maxwell Caul, CRNA Pre-anesthesia Checklist: Patient identified, Emergency Drugs available, Suction available and Patient being monitored Oxygen Delivery Method: Simple face mask

## 2021-01-21 NOTE — Op Note (Signed)
NAME: Amy Moon, Amy Moon RECORD NO: [id number] ACCOUNT NO: [id number] DATE OF BIRTH: 1950-12-22 FACILITY: [facility]ATION: [facility] PHYSICIAN: Lind Guest. Ninfa Linden, MD  Operative Report   DATE OF PROCEDURE: 01/21/2021  PREOPERATIVE DIAGNOSIS:  Primary osteoarthritis and degenerative joint disease, left knee.  POSTOPERATIVE DIAGNOSIS:  Primary osteoarthritis and degenerative joint disease, left knee.  PROCEDURE:  Left total knee arthroplasty.  IMPLANTS:  Zimmer Persona cemented knee system with size 5 CR femur, size D left tibial tray, 16 mm medial congruent polyethylene insert, size 32 patellar button.  SURGEON:  Lind Guest. Ninfa Linden, MD.  ANESTHESIA: 1.  Left lower extremity adductor canal block. 2.  Spinal.  ANTIBIOTICS:  2 g IV Ancef.  ESTIMATED BLOOD LOSS:  100 mL  TOURNIQUET TIME:  56 minutes.  COMPLICATIONS:  None.  INDICATIONS:  The patient is a 70 year old female well known to me.  She has debilitating end-stage arthritis involving her left knee.  She has tried and failed all forms of conservative treatment.  At this point, her left knee pain is daily and it is  detrimentally affecting her mobility, her quality of life and activities of daily living to the point she does wish to proceed with a total knee arthroplasty on the left side and we have recommended this as well.  We talked in length in detail the risk  of acute blood loss anemia, nerve or vessel injury, fracture, infection, DVT, implant failure and skin and soft tissue issues.  We talked about her goals being decreased pain, improve mobility and overall improve quality of life.  DESCRIPTION OF PROCEDURE:  After informed consent was obtained, appropriate left knee was marked.  An adductor canal block was obtained in the left lower extremity in the holding room.  She was then brought to the operating room and sat up on the  operating table.  Spinal anesthesia was obtained.  She was laid in supine position on  the operating table.  Foley catheter was placed and a nonsterile tourniquet was placed around the upper left thigh.  Her left thigh knee, leg, and ankle were prepped  and draped in DuraPrep and sterile drapes including a sterile stockinette.  A timeout was called.  She was identified correct patient, correct left knee.  We then used Esmarch to wrap that leg and tourniquet was inflated to 300 mm of pressure.  I then  made a direct midline incision over the patella and carried this proximally and distally.  I dissected down the knee joint and carried out a medial parapatellar arthrotomy, finding very large joint effusion.  With the knee in a flexed position, we found  the cartilage denuded throughout the knee.  We removed osteophytes in all three compartments as well as remnants of the ACL and medial and lateral meniscus.  We then used the extramedullary cutting guide for making our proximal tibia cut correcting for  varus and valgus and a -3 degrees slope.  We made this cut to take 9 mm off the high side, we made the cut and it certainly turned out to be a little thicker tibial cut than I usually do.  We then used an intramedullary guide for making our distal  femoral cut, setting this for a left knee at 5 degrees externally rotated and a 10 mm distal femoral cut was made that I cut without difficulty, and brought the knee back down to full extension and it took about a 14 mm extension block for me to feel  comfortable with the  extension that she was back to full.  We then back to the femur and put our femoral sizing guide based off the epicondylar axis and Whiteside's line.  Based off of this, we chose a size 5 femur.  We put a 4-in-1 cutting block for a  size 5 femur, we made our anterior and posterior cuts, followed by our chamfer cuts.  We then went back to the tibia and chose a size D left tibial tray for coverage, setting the rotation of the tibial tubercle and the femur.  We made our keel punch off  of  this.  With a size D trial tibia, we trialed a 5 left femur and we went with a 14 mm medial congruent polyethylene insert.  I felt like we needed just a little more stable and the final would be a 16.  We then made our patellar cut and drilled three  holes for a size 32 patellar button.  I then removed all trial instrumentation from the knee and irrigated the knee with normal saline solution using pulse lavage.  We dried the knee real well and then mixed our cement.  We then cemented our real Zimmer  Persona size D left tibial tray followed by our size 5 CR left femur.  We placed our real 16 mm medial congruent polyethylene insert and cemented our size 32 patellar button.  I held the knee completely extended and compressed while we removed cement  debris from the knee and allowed the cement to harden.  Once it hardened I put the knee through range of motion.  It was felt to be stable.  We then let the tourniquet down and hemostasis was obtained with electrocautery.  I then closed the arthrotomy  with interrupted #1 Vicryl suture followed by 0 Vicryl for the deep tissue and 2-0 Vicryl, closed the subcutaneous tissue.  The skin was closed with staples.  A well-padded sterile dressing was applied.  She was taken to recovery room in stable condition  with all final counts being correct.  There are no complications noted.   PUS D: 01/21/2021 2:01:56 pm T: 01/21/2021 8:33:00 pm  JOB: [id number]

## 2021-01-22 DIAGNOSIS — M810 Age-related osteoporosis without current pathological fracture: Secondary | ICD-10-CM | POA: Diagnosis present

## 2021-01-22 DIAGNOSIS — Z803 Family history of malignant neoplasm of breast: Secondary | ICD-10-CM | POA: Diagnosis not present

## 2021-01-22 DIAGNOSIS — G894 Chronic pain syndrome: Secondary | ICD-10-CM | POA: Diagnosis present

## 2021-01-22 DIAGNOSIS — E785 Hyperlipidemia, unspecified: Secondary | ICD-10-CM | POA: Diagnosis present

## 2021-01-22 DIAGNOSIS — E669 Obesity, unspecified: Secondary | ICD-10-CM | POA: Diagnosis present

## 2021-01-22 DIAGNOSIS — Z20822 Contact with and (suspected) exposure to covid-19: Secondary | ICD-10-CM | POA: Diagnosis present

## 2021-01-22 DIAGNOSIS — Z96652 Presence of left artificial knee joint: Secondary | ICD-10-CM

## 2021-01-22 DIAGNOSIS — K7460 Unspecified cirrhosis of liver: Secondary | ICD-10-CM | POA: Diagnosis present

## 2021-01-22 DIAGNOSIS — Z6831 Body mass index (BMI) 31.0-31.9, adult: Secondary | ICD-10-CM | POA: Diagnosis not present

## 2021-01-22 DIAGNOSIS — Z823 Family history of stroke: Secondary | ICD-10-CM | POA: Diagnosis not present

## 2021-01-22 DIAGNOSIS — Z8249 Family history of ischemic heart disease and other diseases of the circulatory system: Secondary | ICD-10-CM | POA: Diagnosis not present

## 2021-01-22 DIAGNOSIS — Z83438 Family history of other disorder of lipoprotein metabolism and other lipidemia: Secondary | ICD-10-CM | POA: Diagnosis not present

## 2021-01-22 DIAGNOSIS — J45909 Unspecified asthma, uncomplicated: Secondary | ICD-10-CM | POA: Diagnosis present

## 2021-01-22 DIAGNOSIS — M1712 Unilateral primary osteoarthritis, left knee: Secondary | ICD-10-CM | POA: Diagnosis present

## 2021-01-22 DIAGNOSIS — Z8261 Family history of arthritis: Secondary | ICD-10-CM | POA: Diagnosis not present

## 2021-01-22 DIAGNOSIS — G629 Polyneuropathy, unspecified: Secondary | ICD-10-CM | POA: Diagnosis present

## 2021-01-22 DIAGNOSIS — Z800 Family history of malignant neoplasm of digestive organs: Secondary | ICD-10-CM | POA: Diagnosis not present

## 2021-01-22 DIAGNOSIS — M868X6 Other osteomyelitis, lower leg: Secondary | ICD-10-CM | POA: Diagnosis present

## 2021-01-22 DIAGNOSIS — E871 Hypo-osmolality and hyponatremia: Secondary | ICD-10-CM | POA: Diagnosis present

## 2021-01-22 DIAGNOSIS — N183 Chronic kidney disease, stage 3 unspecified: Secondary | ICD-10-CM | POA: Diagnosis present

## 2021-01-22 DIAGNOSIS — D649 Anemia, unspecified: Secondary | ICD-10-CM | POA: Diagnosis present

## 2021-01-22 DIAGNOSIS — K766 Portal hypertension: Secondary | ICD-10-CM | POA: Diagnosis present

## 2021-01-22 DIAGNOSIS — Z8711 Personal history of peptic ulcer disease: Secondary | ICD-10-CM | POA: Diagnosis not present

## 2021-01-22 DIAGNOSIS — K219 Gastro-esophageal reflux disease without esophagitis: Secondary | ICD-10-CM | POA: Diagnosis present

## 2021-01-22 DIAGNOSIS — Z820 Family history of epilepsy and other diseases of the nervous system: Secondary | ICD-10-CM | POA: Diagnosis not present

## 2021-01-22 HISTORY — DX: Presence of left artificial knee joint: Z96.652

## 2021-01-22 LAB — CBC
HCT: 33.4 % — ABNORMAL LOW (ref 36.0–46.0)
Hemoglobin: 11.2 g/dL — ABNORMAL LOW (ref 12.0–15.0)
MCH: 32.1 pg (ref 26.0–34.0)
MCHC: 33.5 g/dL (ref 30.0–36.0)
MCV: 95.7 fL (ref 80.0–100.0)
Platelets: 89 10*3/uL — ABNORMAL LOW (ref 150–400)
RBC: 3.49 MIL/uL — ABNORMAL LOW (ref 3.87–5.11)
RDW: 13.2 % (ref 11.5–15.5)
WBC: 5.6 10*3/uL (ref 4.0–10.5)
nRBC: 0 % (ref 0.0–0.2)

## 2021-01-22 LAB — BASIC METABOLIC PANEL
Anion gap: 8 (ref 5–15)
BUN: 8 mg/dL (ref 8–23)
CO2: 31 mmol/L (ref 22–32)
Calcium: 8.4 mg/dL — ABNORMAL LOW (ref 8.9–10.3)
Chloride: 97 mmol/L — ABNORMAL LOW (ref 98–111)
Creatinine, Ser: 0.68 mg/dL (ref 0.44–1.00)
GFR, Estimated: >60 mL/min (ref >60)
Glucose, Bld: 107 mg/dL — ABNORMAL HIGH (ref 70–99)
Potassium: 3 mmol/L — ABNORMAL LOW (ref 3.5–5.1)
Sodium: 136 mmol/L (ref 135–145)

## 2021-01-22 MED ORDER — OXYCODONE HCL 5 MG PO TABS: 5.0000 mg | ORAL_TABLET | ORAL | 0 refills | Status: DC | PRN

## 2021-01-22 NOTE — Discharge Instructions (Signed)

## 2021-01-22 NOTE — Plan of Care (Signed)
  Problem: Clinical Measurements: Goal: Postoperative complications will be avoided or minimized Outcome: Progressing   Problem: Pain Management: Goal: Pain level will decrease with appropriate interventions Outcome: Progressing   

## 2021-01-22 NOTE — Progress Notes (Signed)
Physical Therapy Treatment Patient Details Name: Amy Moon MRN: [id number] DOB: 01/15/1951 Today's Date: 01/22/2021    History of Present Illness Pt is 70 yo female s/p L TKA on 01/21/21.  She has hx including but not limited to OA, osteoporosis, neuropathy, depression, kidney disease, chronic pain syndrome, Parkinson's disease, and obesity.    PT Comments    Pt limited this afternoon due to severe lethargy.  She had received IV dilaudid prior to PT session and was unable to maintain eyes open.  Performed exercises as able but unsafe to attempt OOB.  Pt still unable to quad set on L.     Follow Up Recommendations  Follow surgeon's recommendation for DC plan and follow-up therapies;Supervision/Assistance - 24 hour (may need to consider SNF if not progressing)     Equipment Recommendations  Other (comment) (has RW and BSC in room)    Recommendations for Other Services       Precautions / Restrictions Precautions Precautions: Fall Required Braces or Orthoses: Knee Immobilizer - Left Knee Immobilizer - Left: Discontinue once straight leg raise with < 10 degree lag Restrictions LLE Weight Bearing: Weight bearing as tolerated    Mobility  Bed Mobility     Transfers Unable to safely perform any transfers due to lethargy , falling asleep with exercise, could not maintain eyes open.     Stairs             Wheelchair Mobility    Modified Rankin (Stroke Patients Only)       Balance Overall balance assessment: Needs assistance Sitting-balance support: Bilateral upper extremity supported Sitting balance-Leahy Scale: Poor Sitting balance - Comments: unable due to lethargy   Standing balance support: Bilateral upper extremity supported Standing balance-Leahy Scale: Poor Standing balance comment: Requiring UE support and min A from therapist                            Cognition Arousal/Alertness: Lethargic;Suspect due to medications Behavior During  Therapy: [facility] for tasks assessed/performed Overall Cognitive Status: Difficult to assess                                 General Comments: Pt had received IV Dilaudid prior to therapy at was extremely lethargic and falling asleep during exercises.  When pt falling asleep O2 sats dropping to 85%.  Placed 2 L O2 with sats back to 90%.  Notified RN.       Exercises Total Joint Exercises Ankle Circles/Pumps: AROM;Both;10 reps;Supine (tactile cues for full ROM on L) Quad Sets:  (still unable to quad set on L) Heel Slides: PROM;Left;10 reps;Supine (to tolerance) Hip ABduction/ADduction: Left;AAROM;10 reps;Supine (very light assist from pt) Goniometric ROM: L knee 5 to 30 degrees Other Exercises Other Exercises: Pt needing tactile and verbal cues to stay awake and assist with exercises.  Cues for correct exercise form    General Comments General comments (skin integrity, edema, etc.): Educated pt and family on PT role/POC and importance of early mobility.  Discussed from therapy professional opinion , pt would not be ready to go home today based on pain control, weak quad, and limited mobility.  They verbalized understanding.  Spouse asking what she would need to be able to do in order to go home.  Discussed need to walk household distances (50-70' at least) and perform her 2 steps.  DId discuss if she becomes medically  ready for d/c but not meeting these goals may need to consider SNF placement.  She is motivated to work toward home      Pertinent Vitals/Pain Pain Assessment: Faces Faces Pain Scale: No hurt (0/10 rest, 6/10 exercise) Pain Location: L knee Pain Descriptors / Indicators: Sharp Pain Intervention(s): Limited activity within patient's tolerance;Monitored during session;Premedicated before session    Home Living                      Prior Function            PT Goals (current goals can now be found in the care plan section) Acute Rehab PT Goals Patient  Stated Goal: return home; decrease pain PT Goal Formulation: With patient/family Time For Goal Achievement: 02/05/21 Potential to Achieve Goals: Good Progress towards PT goals: Not progressing toward goals - comment (limited by pain/lethargy)    Frequency    7X/week      PT Plan Current plan remains appropriate    Co-evaluation              AM-PAC PT "6 Clicks" Mobility   Outcome Measure  Help needed turning from your back to your side while in a flat bed without using bedrails?: A Lot Help needed moving from lying on your back to sitting on the side of a flat bed without using bedrails?: A Lot Help needed moving to and from a bed to a chair (including a wheelchair)?: A Lot Help needed standing up from a chair using your arms (e.g., wheelchair or bedside chair)?: A Lot Help needed to walk in hospital room?: Total Help needed climbing 3-5 steps with a railing? : Total 6 Click Score: 10    End of Session Equipment Utilized During Treatment: Gait belt;Left knee immobilizer Activity Tolerance: Patient limited by pain;Patient limited by lethargy Patient left: in bed;with call bell/phone within reach;with bed alarm set;with SCD's reapplied;with family/visitor present Nurse Communication: Mobility status;Other (comment) (lethargy and had to don 2 L O2 due to decreased sats when asleep) PT Visit Diagnosis: Other abnormalities of gait and mobility (R26.89);Unsteadiness on feet (R26.81);Muscle weakness (generalized) (M62.81)     Time: [id number] PT Time Calculation (min) (ACUTE ONLY): 19 min  Charges:  $Therapeutic Exercise: 8-22 mins $Therapeutic Activity: 8-22 mins                     Abran Richard, PT Acute Rehab Services Pager [phone] [facility] [phone]    Karlton Lemon 01/22/2021, 2:34 PM

## 2021-01-22 NOTE — Progress Notes (Signed)
Patient ID: Amy Moon, female   DOB: [birth year], 70 y.o.   MRN: [id number] Physical therapy just work with the patient and feel that she will not be ready for discharge to home today.  She does have some balance issues and it would not be safe to send her home today.  I will change her to an inpatient admission.

## 2021-01-22 NOTE — Progress Notes (Signed)
Subjective: 1 Day Post-Op Procedure(s) (LRB): LEFT TOTAL KNEE ARTHROPLASTY (Left) Patient reports pain as moderate.    Objective: Vital signs in last 24 hours: Temp:  [97.6 F (36.4 C)-99.8 F (37.7 C)] 99.8 F (37.7 C) (07/30 0921) Pulse Rate:  [73-106] 106 (07/30 0921) Resp:  [11-20] 16 (07/30 0921) BP: (92-127)/(40-74) 127/60 (07/30 0921) SpO2:  [89 %-100 %] 89 % (07/30 0921)  Intake/Output from previous day: 07/29 0701 - 07/30 0700 In: 4503.3 [P.O.:480; I.V.:3557.3; IV Piggyback:466] Out: [id number] [Urine:3825; Blood:70] Intake/Output this shift: No intake/output data recorded.  Recent Labs    01/22/21 0258  HGB 11.2*   Recent Labs    01/22/21 0258  WBC 5.6  RBC 3.49*  HCT 33.4*  PLT 89*   Recent Labs    01/22/21 0258  NA 136  K 3.0*  CL 97*  CO2 31  BUN 8  CREATININE 0.68  GLUCOSE 107*  CALCIUM 8.4*   No results for input(s): LABPT, INR in the last 72 hours.  Sensation intact distally Intact pulses distally Dorsiflexion/Plantar flexion intact Incision: dressing C/D/I No cellulitis present Compartment soft   Assessment/Plan: 1 Day Post-Op Procedure(s) (LRB): LEFT TOTAL KNEE ARTHROPLASTY (Left) Up with therapy Discharge home with home health this afternoon if doing well.      Amy Moon 01/22/2021, 10:02 AM

## 2021-01-22 NOTE — Plan of Care (Signed)
  Problem: Education: Goal: Knowledge of the prescribed therapeutic regimen will improve Outcome: Progressing   Problem: Activity: Goal: Ability to avoid complications of mobility impairment will improve Outcome: Progressing   Problem: Pain Management: Goal: Pain level will decrease with appropriate interventions Outcome: Progressing   

## 2021-01-22 NOTE — TOC Transition Note (Signed)
Transition of Care [facility]) - CM/SW Discharge Note   Patient Details  Name: Amy Moon MRN: [id number] Date of Birth: 1951/01/31  Transition of Care [facility] CM/SW Contact:  Lennart Pall, LCSW Phone Number: 01/22/2021, 10:02 AM   Clinical Narrative:    Met with pt and spouse and confirming 3n1 and rw delivered to room via [place].  Plan for HHPT via [place].  No further TOC needs.   Final next level of care: [place] Barriers to Discharge: No Barriers Identified   Patient Goals and CMS Choice Patient states their goals for this hospitalization and ongoing recovery are:: return home      Discharge Placement                       Discharge Plan and Services                DME Arranged: 3-N-1, Walker rolling DME Agency: [employer]       HH Arranged: PT [place] Agency: [place]        Social Determinants of Health (SDOH) Interventions     Readmission Risk Interventions No flowsheet data found.

## 2021-01-22 NOTE — Evaluation (Signed)
Physical Therapy Evaluation Patient Details Name: Amy Moon MRN: [id number] DOB: 1951-03-24 Today's Date: 01/22/2021   History of Present Illness  Pt is 70 yo female s/p L TKA on 01/21/21.  She has hx including but not limited to OA, osteoporosis, neuropathy, depression, kidney disease, chronic pain syndrome, Parkinson's disease, and obesity.  Clinical Impression  Pt is s/p L TKA resulting in the deficits listed below (see PT Problem List). Pt seen on POD #1 for evaluation.  She reports significant pain (did get some relief in chair, was premedicated) and hand no active quad contraction (noted nerve block for sx).  Utilized KI for transfers and pt was able to transfer to the chair with min-mod A of 2 for safety but did not put L foot on the ground due to pain. At baseline, pt reports days with limitations, but overall independent with ADLs and ambulation without AD.  Pt needs further therapy prior to discharging home - notified MD pt would likely not be safe to d/c home today. Pt will benefit from skilled PT to increase their independence and safety with mobility to allow discharge to the venue listed below.      Follow Up Recommendations Follow surgeon's recommendation for DC plan and follow-up therapies;Supervision/Assistance - 24 hour (may need to consider SNF if not progressing)    Equipment Recommendations  Other (comment) (has RW and BSC delivered in room)    Recommendations for Other Services       Precautions / Restrictions Precautions Precautions: Fall Required Braces or Orthoses: Knee Immobilizer - Left Knee Immobilizer - Left: Discontinue once straight leg raise with < 10 degree lag Restrictions Weight Bearing Restrictions: No LLE Weight Bearing: Weight bearing as tolerated      Mobility  Bed Mobility Overal bed mobility: Needs Assistance Bed Mobility: Supine to Sit     Supine to sit: Mod assist;+2 for safety/equipment     General bed mobility comments: Assist for  L leg, scooting forward, and trunk; had assist of 2 for pain control    Transfers Overall transfer level: Needs assistance Equipment used: Rolling walker (2 wheeled) Transfers: Sit to/from [facility] Sit to Stand: Mod assist;+2 safety/equipment Stand pivot transfers: Min assist;+2 safety/equipment       General transfer comment: Assist of 2 for safety; cues for hand placement and L LE management; utilized KI on L; min A of 2 to pivot to chair; max cues for all  Ambulation/Gait             General Gait Details: unable to take steps due to pain/nausea/ weakness  Stairs            Wheelchair Mobility    Modified Rankin (Stroke Patients Only)       Balance Overall balance assessment: Needs assistance Sitting-balance support: Bilateral upper extremity supported Sitting balance-Leahy Scale: Poor Sitting balance - Comments: requiring UE support - some limitations due to pain   Standing balance support: Bilateral upper extremity supported Standing balance-Leahy Scale: Poor Standing balance comment: Requiring UE support and min A from therapist                             Pertinent Vitals/Pain Pain Assessment: 0-10 Pain Score: 10-Worst pain ever Pain Location: L knee Pain Descriptors / Indicators: Discomfort;Sharp Pain Intervention(s): Limited activity within patient's tolerance;Monitored during session;Premedicated before session;Patient requesting pain meds-RN notified;Repositioned;Ice applied (did feel some better once in chair)    Home  Living Family/patient expects to be discharged to:: Private residence Living Arrangements: Spouse/significant other Available Help at Discharge: Family;Available 24 hours/day Type of Home: House Home Access: Stairs to enter Entrance Stairs-Rails: None Entrance Stairs-Number of Steps: 2 Home Layout: One level Home Equipment: Cane - single point Additional Comments: walker and bsc delivered in room     Prior Function Level of Independence: Needs assistance   Gait / Transfers Assistance Needed: Pt ambulated without AD; could ambulate in grocery store but with some difficulty  ADL's / Homemaking Assistance Needed: Pt reports mostly independent with ADLs -spouse assisted at times; Shared duties in [place]        Extremity/Trunk Assessment   Upper Extremity Assessment Upper Extremity Assessment: Overall WFL for tasks assessed    Lower Extremity Assessment Lower Extremity Assessment: LLE deficits/detail;RLE deficits/detail RLE Deficits / Details: ROM WFL: MMT 5/5 LLE Deficits / Details: Limited by pain; post op changes.  ROM: ankle and hip WFL, knee 5 to 10 degrees limited by pain; MMT: ankle 4/5, hip 1/5, knee 0/5 - no active quad contraction, did have nerve block during surgery LLE Sensation: WNL    Cervical / Trunk Assessment Cervical / Trunk Assessment: Kyphotic  Communication   Communication: HOH  Cognition Arousal/Alertness: Awake/alert Behavior During Therapy: WFL for tasks assessed/performed Overall Cognitive Status: Within Functional Limits for tasks assessed                                 General Comments: Overall WFL but was at time distracted by pain      General Comments General comments (skin integrity, edema, etc.): Educated pt and family on PT role/POC and importance of early mobility.  Discussed from therapy professional opinion , pt would not be ready to go home today based on pain control, weak quad, and limited mobility.  They verbalized understanding.  Spouse asking what she would need to be able to do in order to go home.  Discussed need to walk household distances (50-70' at least) and perform her 2 steps.  DId discuss if she becomes medically ready for d/c but not meeting these goals may need to consider SNF placement.  She is motivated to work toward home    Exercises Total Joint Exercises Ankle Circles/Pumps:  AROM;Both;10 reps;Supine Other Exercises Other Exercises: Other exercises held due to pain and pt unable to quad set   Assessment/Plan    PT Assessment Patient needs continued PT services  PT Problem List Decreased strength;Decreased mobility;Decreased safety awareness;Decreased range of motion;Decreased knowledge of precautions;Decreased activity tolerance;Decreased balance;Decreased knowledge of use of DME;Pain;Decreased cognition       PT Treatment Interventions DME instruction;Therapeutic activities;Modalities;Gait training;Therapeutic exercise;Patient/family education;Stair training;Balance training;Functional mobility training    PT Goals (Current goals can be found in the Care Plan section)  Acute Rehab PT Goals Patient Stated Goal: return home; decrease pain PT Goal Formulation: With patient/family Time For Goal Achievement: 02/05/21 Potential to Achieve Goals: Good    Frequency 7X/week   Barriers to discharge        Co-evaluation               AM-PAC PT "6 Clicks" Mobility  Outcome Measure Help needed turning from your back to your side while in a flat bed without using bedrails?: A Lot Help needed moving from lying on your back to sitting on the side of a flat bed  without using bedrails?: A Lot Help needed moving to and from a bed to a chair (including a wheelchair)?: A Lot Help needed standing up from a chair using your arms (e.g., wheelchair or bedside chair)?: A Lot Help needed to walk in hospital room?: Total Help needed climbing 3-5 steps with a railing? : Total 6 Click Score: 10    End of Session Equipment Utilized During Treatment: Gait belt;Left knee immobilizer Activity Tolerance: Patient limited by pain Patient left: in chair;with chair alarm set;with call bell/phone within reach;with family/visitor present Nurse Communication: Mobility status PT Visit Diagnosis: Other abnormalities of gait and mobility (R26.89);Unsteadiness on feet (R26.81);Muscle  weakness (generalized) (M62.81)    Time: [id number] PT Time Calculation (min) (ACUTE ONLY): 30 min   Charges:   PT Evaluation $PT Eval Moderate Complexity: 1 Mod PT Treatments $Therapeutic Activity: 8-22 mins        Abran Richard, PT Acute Rehab Services Pager [phone] [facility] [place] 01/22/2021, 10:54 AM

## 2021-01-23 NOTE — Progress Notes (Signed)
   Subjective: 2 Days Post-Op Procedure(s) (LRB): LEFT TOTAL KNEE ARTHROPLASTY (Left) Patient reports pain as moderate.    Objective: Vital signs in last 24 hours: Temp:  [98.8 F (37.1 C)-99.3 F (37.4 C)] 99.3 F (37.4 C) (07/31 0640) Pulse Rate:  [90-100] 90 (07/31 0640) Resp:  [17-18] 17 (07/31 0640) BP: (99-123)/(54-73) 116/54 (07/31 0640) SpO2:  [94 %-96 %] 94 % (07/31 0640)  Intake/Output from previous day: 07/30 0701 - 07/31 0700 In: 534 [P.O.:360; I.V.:174] Out: 900 [Urine:900] Intake/Output this shift: Total I/O In: -  Out: 200 [Urine:200]  Recent Labs    01/22/21 0258  HGB 11.2*   Recent Labs    01/22/21 0258  WBC 5.6  RBC 3.49*  HCT 33.4*  PLT 89*   Recent Labs    01/22/21 0258  NA 136  K 3.0*  CL 97*  CO2 31  BUN 8  CREATININE 0.68  GLUCOSE 107*  CALCIUM 8.4*   No results for input(s): LABPT, INR in the last 72 hours.  Neurologically intact No results found.  Assessment/Plan: 2 Days Post-Op Procedure(s) (LRB): LEFT TOTAL KNEE ARTHROPLASTY (Left) Up with therapy. Just made it out into the hall and short way. Continue PT . Likely here until Monday  Amy Moon 01/23/2021, 10:42 AM

## 2021-01-23 NOTE — Progress Notes (Signed)
Physical Therapy Treatment Patient Details Name: Amy Moon MRN: [id number] DOB: 1951-02-09 Today's Date: 01/23/2021    History of Present Illness Pt is 70 yo female s/p L TKA on 01/21/21.  She has hx including but not limited to OA, osteoporosis, neuropathy, depression, kidney disease, chronic pain syndrome, Parkinson's disease, and obesity.    PT Comments    Pt continues cooperative but requiring increased time for all task and continues to fatigue easily.  Pt up to ambulate increased distance in hall this pm and with noted decreased assist level for all tasks.  Follow Up Recommendations  Follow surgeon's recommendation for DC plan and follow-up therapies;Supervision/Assistance - 24 hour     Equipment Recommendations  Other (comment)    Recommendations for Other Services       Precautions / Restrictions Precautions Precautions: Fall Required Braces or Orthoses: Knee Immobilizer - Left Knee Immobilizer - Left: Discontinue once straight leg raise with < 10 degree lag Restrictions Weight Bearing Restrictions: No LLE Weight Bearing: Weight bearing as tolerated    Mobility  Bed Mobility Overal bed mobility: Needs Assistance Bed Mobility: Supine to Sit;Sit to Supine     Supine to sit: Min assist Sit to supine: Min assist   General bed mobility comments: Increased time wtih assist for L leg    Transfers Overall transfer level: Needs assistance Equipment used: Rolling walker (2 wheeled) Transfers: Sit to/from Stand Sit to Stand: Min assist Stand pivot transfers: Min assist       General transfer comment: cues for LE management and use of UEs to self assist  Ambulation/Gait Ambulation/Gait assistance: Min assist Gait Distance (Feet): 54 Feet Assistive device: Rolling walker (2 wheeled) Gait Pattern/deviations: Step-to pattern;Decreased step length - right;Decreased step length - left;Decreased stance time - left;Shuffle;Trunk flexed Gait velocity: decr    General Gait Details: cues for sequence, posture, position from RW and to increase L LE WB   Stairs             Wheelchair Mobility    Modified Rankin (Stroke Patients Only)       Balance Overall balance assessment: Needs assistance Sitting-balance support: No upper extremity supported;Feet supported Sitting balance-Leahy Scale: Good     Standing balance support: Bilateral upper extremity supported Standing balance-Leahy Scale: Poor                              Cognition Arousal/Alertness: Awake/alert Behavior During Therapy: WFL for tasks assessed/performed                                          Exercises      General Comments        Pertinent Vitals/Pain Pain Assessment: 0-10 Pain Score: 5  Pain Location: L knee Pain Descriptors / Indicators: Aching;Sore Pain Intervention(s): Limited activity within patient's tolerance    Home Living                      Prior Function            PT Goals (current goals can now be found in the care plan section) Acute Rehab PT Goals Patient Stated Goal: return home; decrease pain PT Goal Formulation: With patient Time For Goal Achievement: 02/05/21 Potential to Achieve Goals: Good Progress towards PT goals: Progressing toward goals    Frequency  7X/week      PT Plan Current plan remains appropriate    Co-evaluation              AM-PAC PT "6 Clicks" Mobility   Outcome Measure  Help needed turning from your back to your side while in a flat bed without using bedrails?: A Little Help needed moving from lying on your back to sitting on the side of a flat bed without using bedrails?: A Little Help needed moving to and from a bed to a chair (including a wheelchair)?: A Little Help needed standing up from a chair using your arms (e.g., wheelchair or bedside chair)?: A Little Help needed to walk in hospital room?: A Little Help needed climbing 3-5 steps with  a railing? : A Lot 6 Click Score: 17    End of Session Equipment Utilized During Treatment: Gait belt;Left knee immobilizer Activity Tolerance: Patient tolerated treatment well Patient left: in bed;with call bell/phone within reach;with bed alarm set Nurse Communication: Mobility status PT Visit Diagnosis: Other abnormalities of gait and mobility (R26.89);Unsteadiness on feet (R26.81);Muscle weakness (generalized) (M62.81)     Time: 1401-1430 PT Time Calculation (min) (ACUTE ONLY): 29 min  Charges:  $Gait Training: 23-37 mins                     [place] Pager [phone] Office [phone]    Tou Hayner 01/23/2021, 3:30 PM

## 2021-01-23 NOTE — Progress Notes (Signed)
Physical Therapy Treatment Patient Details Name: Amy Moon MRN: [id number] DOB: 1950-12-27 Today's Date: 01/23/2021    History of Present Illness Pt is 70 yo female s/p L TKA on 01/21/21.  She has hx including but not limited to OA, osteoporosis, neuropathy, depression, kidney disease, chronic pain syndrome, Parkinson's disease, and obesity.    PT Comments    Pt very cooperative and more alert this am.  Pt continues to require assistance and increased time for all tasks but with marked improvement in activity tolerance.  This am, pt participated in therex program, up to [facility] for toileting and ambulated limited distance in hall.  Follow Up Recommendations  Follow surgeon's recommendation for DC plan and follow-up therapies;Supervision/Assistance - 24 hour     Equipment Recommendations       Recommendations for Other Services       Precautions / Restrictions Precautions Precautions: Fall Required Braces or Orthoses: Knee Immobilizer - Left Knee Immobilizer - Left: Discontinue once straight leg raise with < 10 degree lag Restrictions Weight Bearing Restrictions: No LLE Weight Bearing: Weight bearing as tolerated    Mobility  Bed Mobility Overal bed mobility: Needs Assistance Bed Mobility: Supine to Sit     Supine to sit: Min assist     General bed mobility comments: Increased time wtih assist for L leg    Transfers Overall transfer level: Needs assistance Equipment used: Rolling walker (2 wheeled) Transfers: Sit to/from Stand Sit to Stand: Min assist;Mod assist         General transfer comment: cues for LE management and use of UEs to self assist  Ambulation/Gait Ambulation/Gait assistance: Min assist;Mod assist;+2 safety/equipment Gait Distance (Feet): 23 Feet (and 4' to [facility]) Assistive device: Rolling walker (2 wheeled) Gait Pattern/deviations: Step-to pattern;Decreased step length - right;Decreased step length - left;Decreased stance time -  left;Shuffle;Trunk flexed Gait velocity: decr   General Gait Details: cues for sequence, posture, position from RW and to increase L LE WB   Stairs             Wheelchair Mobility    Modified Rankin (Stroke Patients Only)       Balance Overall balance assessment: Needs assistance Sitting-balance support: No upper extremity supported;Feet supported Sitting balance-Leahy Scale: Good     Standing balance support: Bilateral upper extremity supported Standing balance-Leahy Scale: Poor                              Cognition Arousal/Alertness: Awake/alert Behavior During Therapy: WFL for tasks assessed/performed Overall Cognitive Status: Difficult to assess                                        Exercises Total Joint Exercises Ankle Circles/Pumps: AROM;Both;Supine;15 reps Quad Sets: AROM;Both;10 reps;Supine Heel Slides: AAROM;15 reps;Left;Supine Straight Leg Raises: AAROM;Left;15 reps;Supine Goniometric ROM: L knee -8 - 40    General Comments        Pertinent Vitals/Pain Pain Assessment: Faces Faces Pain Scale: Hurts even more Pain Location: L knee Pain Descriptors / Indicators: Aching;Sore Pain Intervention(s): Limited activity within patient's tolerance;Monitored during session;Premedicated before session    Home Living                      Prior Function            PT Goals (current goals can  now be found in the care plan section) Acute Rehab PT Goals Patient Stated Goal: return home; decrease pain PT Goal Formulation: With patient Time For Goal Achievement: 02/05/21 Potential to Achieve Goals: Good Progress towards PT goals: Progressing toward goals    Frequency    7X/week      PT Plan Current plan remains appropriate    Co-evaluation              AM-PAC PT "6 Clicks" Mobility   Outcome Measure  Help needed turning from your back to your side while in a flat bed without using bedrails?: A  Little Help needed moving from lying on your back to sitting on the side of a flat bed without using bedrails?: A Little Help needed moving to and from a bed to a chair (including a wheelchair)?: A Little Help needed standing up from a chair using your arms (e.g., wheelchair or bedside chair)?: A Little Help needed to walk in hospital room?: A Little Help needed climbing 3-5 steps with a railing? : A Lot 6 Click Score: 17    End of Session Equipment Utilized During Treatment: Gait belt;Left knee immobilizer Activity Tolerance: Patient tolerated treatment well Patient left: in chair;with chair alarm set;with nursing/sitter in room [facility]) Nurse Communication: Mobility status PT Visit Diagnosis: Other abnormalities of gait and mobility (R26.89);Unsteadiness on feet (R26.81);Muscle weakness (generalized) (M62.81)     Time: [id number] PT Time Calculation (min) (ACUTE ONLY): 47 min  Charges:  $Gait Training: 8-22 mins $Therapeutic Exercise: 8-22 mins $Therapeutic Activity: 8-22 mins                     [place] Pager [phone] Office [phone]    Kc Sedlak 01/23/2021, 9:44 AM

## 2021-01-24 ENCOUNTER — Encounter (HOSPITAL_COMMUNITY): Payer: Self-pay | Admitting: Orthopaedic Surgery

## 2021-01-24 MED ORDER — LACTATED RINGERS IV BOLUS
500.0000 mL | Freq: Once | INTRAVENOUS | Status: AC
  Administered 2021-01-24: 500 mL via INTRAVENOUS

## 2021-01-24 MED ORDER — OXYCODONE HCL 5 MG PO TABS
5.0000 mg | ORAL_TABLET | ORAL | Status: DC | PRN
  Administered 2021-01-24 – 2021-01-25 (×4): 5 mg via ORAL
  Filled 2021-01-24 (×4): qty 1

## 2021-01-24 MED ORDER — OXYCODONE HCL ER 15 MG PO T12A
15.0000 mg | EXTENDED_RELEASE_TABLET | Freq: Two times a day (BID) | ORAL | Status: DC
  Administered 2021-01-24 – 2021-01-25 (×2): 15 mg via ORAL
  Filled 2021-01-24 (×2): qty 1

## 2021-01-24 MED ORDER — POTASSIUM CHLORIDE ER 10 MEQ PO TBCR
10.0000 meq | EXTENDED_RELEASE_TABLET | Freq: Two times a day (BID) | ORAL | Status: DC
  Administered 2021-01-24 – 2021-01-25 (×2): 10 meq via ORAL
  Filled 2021-01-24 (×4): qty 1

## 2021-01-24 NOTE — Progress Notes (Signed)
Physical Therapy Treatment Patient Details Name: Amy Moon MRN: [id number] DOB: 05/14/1951 Today's Date: 01/24/2021    History of Present Illness Pt is 70 yo female s/p L TKA on 01/21/21.  She has hx including but not limited to OA, osteoporosis, neuropathy, depression, kidney disease, chronic pain syndrome, Parkinson's disease, and obesity.    PT Comments    Pt limited today due to lightheadedness/syncopal symptoms while on stairs.  Able to get pt down steps and seated with BP 99/51 in sitting with symptoms improving.  Suspect BP was lower when standing and on steps.  Continues to have limited quad contraction and walking limited distances.  She had received oral pain meds prior to session and was lethargic.  Continue to progress as able.    Follow Up Recommendations  Follow surgeon's recommendation for DC plan and follow-up therapies;Supervision/Assistance - 24 hour     Equipment Recommendations  Other (comment) (has DME delivered in room)    Recommendations for Other Services       Precautions / Restrictions Precautions Precautions: Fall Required Braces or Orthoses: Knee Immobilizer - Left Knee Immobilizer - Left: Discontinue once straight leg raise with < 10 degree lag Restrictions Weight Bearing Restrictions: Yes LLE Weight Bearing: Weight bearing as tolerated    Mobility  Bed Mobility Overal bed mobility: Needs Assistance Bed Mobility: Supine to Sit     Supine to sit: Min assist;HOB elevated     General bed mobility comments: Increased time wtih assist for L leg    Transfers Overall transfer level: Needs assistance Equipment used: Rolling walker (2 wheeled) Transfers: Sit to/from Stand Sit to Stand: Min assist         General transfer comment: cues for LE management and use of UEs to self assist  Ambulation/Gait Ambulation/Gait assistance: Min assist Gait Distance (Feet): 24 Feet Assistive device: Rolling walker (2 wheeled) Gait Pattern/deviations:  Step-to pattern;Decreased stance time - left;Shuffle;Trunk flexed Gait velocity: decr   General Gait Details: Cues for posture and RW; limited distance due to became lightheaded on the steps   Stairs Stairs: Yes Stairs assistance: Min assist;+2 safety/equipment Stair Management: Two rails;Step to pattern Number of Stairs: 3 General stair comments: Pt went up 3 steps and c/o being hot and then feeling woozy.  Got her down the steps and into chair with min A of 2 and max cues. BP was 99/51 and symptoms improving once in chair but suspect lower when on steps - notified RN>   Wheelchair Mobility    Modified Rankin (Stroke Patients Only)       Balance Overall balance assessment: Needs assistance Sitting-balance support: No upper extremity supported;Feet supported Sitting balance-Leahy Scale: Good     Standing balance support: Bilateral upper extremity supported Standing balance-Leahy Scale: Poor Standing balance comment: Requiring UE support and min A from therapist                            Cognition Arousal/Alertness: Lethargic Behavior During Therapy: WFL for tasks assessed/performed Overall Cognitive Status: Within Functional Limits for tasks assessed                                 General Comments: Overall was [facility] but lethargic and increased cues to focus on task at hand ; received meds prior to session      Exercises Total Joint Exercises Ankle Circles/Pumps: AROM;Both;Supine;15 reps (encouraged for  full ROM) Quad Sets: AROM;Both;10 reps;Supine (very min contraction on L) Short Arc Quad: AAROM;Left;10 reps;Supine Heel Slides: AAROM;Left;Supine;10 reps Hip ABduction/ADduction: Left;AAROM;10 reps;Supine Goniometric ROM: L knee 8 degrees to 60 degrees    General Comments        Pertinent Vitals/Pain Pain Assessment: 0-10 Pain Score: 8  Pain Location: L knee Pain Descriptors / Indicators: Aching;Sore;Discomfort Pain Intervention(s):  Limited activity within patient's tolerance;Monitored during session;Premedicated before session;Ice applied    Home Living                      Prior Function            PT Goals (current goals can now be found in the care plan section) Acute Rehab PT Goals Patient Stated Goal: return home; decrease pain PT Goal Formulation: With patient Time For Goal Achievement: 02/05/21 Potential to Achieve Goals: Good Progress towards PT goals: Progressing toward goals    Frequency    7X/week      PT Plan Current plan remains appropriate    Co-evaluation              AM-PAC PT "6 Clicks" Mobility   Outcome Measure  Help needed turning from your back to your side while in a flat bed without using bedrails?: A Little Help needed moving from lying on your back to sitting on the side of a flat bed without using bedrails?: A Little Help needed moving to and from a bed to a chair (including a wheelchair)?: A Little Help needed standing up from a chair using your arms (e.g., wheelchair or bedside chair)?: A Little Help needed to walk in hospital room?: A Little Help needed climbing 3-5 steps with a railing? : A Lot 6 Click Score: 17    End of Session Equipment Utilized During Treatment: Gait belt;Left knee immobilizer Activity Tolerance: Patient limited by lethargy (and lightheadedness/low bp) Patient left: with call bell/phone within reach;with chair alarm set;in chair Nurse Communication: Mobility status (likely orthostatic hypotension) PT Visit Diagnosis: Other abnormalities of gait and mobility (R26.89);Unsteadiness on feet (R26.81);Muscle weakness (generalized) (M62.81)     Time: [id number] PT Time Calculation (min) (ACUTE ONLY): 30 min  Charges:  $Gait Training: 8-22 mins $Therapeutic Exercise: 8-22 mins                     Abran Richard, PT Acute Rehab Services Pager [phone] [facility] 01/24/2021, 1:10 PM

## 2021-01-24 NOTE — Progress Notes (Signed)
     Subjective: 3 Days Post-Op Procedure(s) (LRB): LEFT TOTAL KNEE ARTHROPLASTY (Left) Awake, alert and oriented x 4. Blood pressure responded to LR 500 cc bolus and restart IVF at 100 CC per hour.  She is awake and alert tonight.  Pain is controlled but she was sedate this AM, I will change to  Oxycontin 10 mg q 12 hours and intermittant IR 5 mg.  Voiding well   Patient reports pain as moderate.    Objective:   VITALS:  Temp:  [98.6 F (37 C)-99.6 F (37.6 C)] 98.6 F (37 C) (08/01 1323) Pulse Rate:  [89-97] 89 (08/01 1446) Resp:  [17-18] 18 (08/01 1323) BP: (87-120)/(42-63) 104/63 (08/01 1446) SpO2:  [91 %-96 %] 96 % (08/01 1323)  Neurologically intact ABD soft Neurovascular intact Sensation intact distally Intact pulses distally Dorsiflexion/Plantar flexion intact Incision: scant drainage   LABS Recent Labs    01/22/21 0258  HGB 11.2*  WBC 5.6  PLT 89*   Recent Labs    01/22/21 0258  NA 136  K 3.0*  CL 97*  CO2 31  BUN 8  CREATININE 0.68  GLUCOSE 107*   No results for input(s): LABPT, INR in the last 72 hours.   Assessment/Plan: 3 Days Post-Op Procedure(s) (LRB): LEFT TOTAL KNEE ARTHROPLASTY (Left)  Advance diet Up with therapy Plan for discharge tomorrow Adjust meds and if she is stable she may go home tomorrow, check K level. Basil Dess 01/24/2021, 7:38 PM Patient ID: Amy Moon, female   DOB: April 13, 1951, 70 y.o.   MRN: [id number]

## 2021-01-24 NOTE — Progress Notes (Signed)
Physical Therapy Treatment Patient Details Name: Amy Moon MRN: [id number] DOB: [birth year] Today's Date: 01/24/2021    History of Present Illness Pt is 70 yo female s/p L TKA on 01/21/21.  She has hx including but not limited to OA, osteoporosis, neuropathy, depression, kidney disease, chronic pain syndrome, Parkinson's disease, and obesity.    PT Comments    Pt with gradual progress.  Noted that pt had episode of low BP after PT earlier - RN reports pt received bolus and now on continuous fluids.  Pt reports feeling some better , but BP still soft at times and periods of lightheadedness.  Pt also still remains somewhat lethargic and mild confusion at times (asking therapist same question multiple times).  She did ambulate and performed steps but with max cues - additionally, needs stair training with spouse present as she does not have rails at home.  Tolerating ROM but still with very weak quad contraction and requiring KI for ambulation/stairs.     Follow Up Recommendations  Follow surgeon's recommendation for DC plan and follow-up therapies;Supervision/Assistance - 24 hour     Equipment Recommendations  Other (comment) (has DME delivered in room)    Recommendations for Other Services       Precautions / Restrictions Precautions Precautions: Fall Required Braces or Orthoses: Knee Immobilizer - Left Knee Immobilizer - Left: Discontinue once straight leg raise with < 10 degree lag Restrictions LLE Weight Bearing: Weight bearing as tolerated    Mobility  Bed Mobility Overal bed mobility: Needs Assistance Bed Mobility: Sit to Supine       Sit to supine: Min assist   General bed mobility comments: Increased time and assist for L leg    Transfers Overall transfer level: Needs assistance Equipment used: Rolling walker (2 wheeled) Transfers: Sit to/from Stand Sit to Stand: Min assist         General transfer comment: cues for LE management and safe hand placement;  performed x 3  Ambulation/Gait Ambulation/Gait assistance: Min assist Gait Distance (Feet): 45 Feet Assistive device: Rolling walker (2 wheeled) Gait Pattern/deviations: Step-to pattern;Decreased stance time - left;Shuffle;Trunk flexed Gait velocity: decr   General Gait Details: Cues for posture and RW proximity; Had episode of lightheadedness after ~25' but BP stable and resolved with rest and able to ambulate another 25 '   Stairs Stairs: Yes Stairs assistance: Min assist;+2 safety/equipment Stair Management: No rails;Step to pattern;Backwards;With walker Number of Stairs: 2 General stair comments: Up/down 2 backward with max cues, min A, and RW blocked.   Wheelchair Mobility    Modified Rankin (Stroke Patients Only)       Balance Overall balance assessment: Needs assistance Sitting-balance support: No upper extremity supported;Feet supported Sitting balance-Leahy Scale: Good     Standing balance support: Bilateral upper extremity supported Standing balance-Leahy Scale: Poor Standing balance comment: Requiring UE support                            Cognition Arousal/Alertness: Lethargic Behavior During Therapy: WFL for tasks assessed/performed Overall Cognitive Status: Impaired/Different from baseline Area of Impairment: Memory                     Memory: Decreased short-term memory         General Comments: Overall was [facility] but lethargic and increased cues to focus on task at hand and asked therapist 3 x during session how to dial out on phone ; received meds  prior to session      Exercises Total Joint Exercises Ankle Circles/Pumps: AROM;Both;Supine;15 reps Quad Sets: AROM;Both;10 reps;Supine (very minimal contraction on L) Short Arc Quad: AAROM;Left;10 reps;Supine Heel Slides: AAROM;Left;Supine;10 reps Hip ABduction/ADduction: Left;AAROM;10 reps;Supine Goniometric ROM: L knee 8 to 60 degrees    General Comments   BP during  session: Seated 105/44 Standing 104/61 -reports initial lightheaded Standing after walking 25' when she became lightheaded 124/57 Sitting before stairs 116/51 Return to bed 117/51      Pertinent Vitals/Pain Pain Assessment: 0-10 Pain Score: 7  Pain Location: L knee Pain Descriptors / Indicators: Aching;Sore;Discomfort Pain Intervention(s): Limited activity within patient's tolerance;Monitored during session;Premedicated before session;Ice applied    Home Living                      Prior Function            PT Goals (current goals can now be found in the care plan section) Acute Rehab PT Goals Patient Stated Goal: return home; decrease pain PT Goal Formulation: With patient Time For Goal Achievement: 02/05/21 Potential to Achieve Goals: Good Progress towards PT goals: Progressing toward goals    Frequency    7X/week      PT Plan Current plan remains appropriate    Co-evaluation              AM-PAC PT "6 Clicks" Mobility   Outcome Measure  Help needed turning from your back to your side while in a flat bed without using bedrails?: A Little Help needed moving from lying on your back to sitting on the side of a flat bed without using bedrails?: A Little Help needed moving to and from a bed to a chair (including a wheelchair)?: A Little Help needed standing up from a chair using your arms (e.g., wheelchair or bedside chair)?: A Little Help needed to walk in hospital room?: A Little Help needed climbing 3-5 steps with a railing? : A Lot 6 Click Score: 17    End of Session Equipment Utilized During Treatment: Gait belt;Left knee immobilizer Activity Tolerance: Patient limited by lethargy Patient left: with call bell/phone within reach;in bed;with bed alarm set;with SCD's reapplied Nurse Communication: Mobility status PT Visit Diagnosis: Other abnormalities of gait and mobility (R26.89);Unsteadiness on feet (R26.81);Muscle weakness (generalized)  (M62.81)     Time: [id number] PT Time Calculation (min) (ACUTE ONLY): 42 min  Charges:  $Gait Training: 8-22 mins $Therapeutic Exercise: 8-22 mins $Therapeutic Activity: 8-22 mins                     Abran Richard, PT Acute Rehab Services Pager [phone] [facility] [place] 01/24/2021, 5:14 PM

## 2021-01-25 LAB — BASIC METABOLIC PANEL
Anion gap: 6 (ref 5–15)
BUN: 11 mg/dL (ref 8–23)
CO2: 28 mmol/L (ref 22–32)
Calcium: 8 mg/dL — ABNORMAL LOW (ref 8.9–10.3)
Chloride: 101 mmol/L (ref 98–111)
Creatinine, Ser: 0.54 mg/dL (ref 0.44–1.00)
GFR, Estimated: >60 mL/min (ref >60)
Glucose, Bld: 103 mg/dL — ABNORMAL HIGH (ref 70–99)
Potassium: 3 mmol/L — ABNORMAL LOW (ref 3.5–5.1)
Sodium: 135 mmol/L (ref 135–145)

## 2021-01-25 LAB — CBC
HCT: 25.5 % — ABNORMAL LOW (ref 36.0–46.0)
Hemoglobin: 8.6 g/dL — ABNORMAL LOW (ref 12.0–15.0)
MCH: 32 pg (ref 26.0–34.0)
MCHC: 33.7 g/dL (ref 30.0–36.0)
MCV: 94.8 fL (ref 80.0–100.0)
Platelets: 107 10*3/uL — ABNORMAL LOW (ref 150–400)
RBC: 2.69 MIL/uL — ABNORMAL LOW (ref 3.87–5.11)
RDW: 12.8 % (ref 11.5–15.5)
WBC: 4.2 10*3/uL (ref 4.0–10.5)
nRBC: 0 % (ref 0.0–0.2)

## 2021-01-25 NOTE — Progress Notes (Signed)
Physical Therapy Treatment Patient Details Name: Amy Moon MRN: [id number] DOB: [birth year] Today's Date: 01/25/2021    History of Present Illness Pt is 70 yo female s/p L TKA on 01/21/21.  She has hx including but not limited to OA, osteoporosis, neuropathy, depression, kidney disease, chronic pain syndrome, Parkinson's disease, and obesity.    PT Comments    POD # 4 am session Assisted OOB.  General bed mobility comments: demonstarted and educated how to use a belt to self assist LE.  Assisted with amb a limited distance due to fatigue/pain level.  Assisted to recliner.  Performed a few TE's but need spouse present to practice stairs.     Follow Up Recommendations  Follow surgeon's recommendation for DC plan and follow-up therapies;Supervision/Assistance - 24 hour     Equipment Recommendations  Rolling walker with 5" wheels;3in1 (PT) (delivered)    Recommendations for Other Services       Precautions / Restrictions Precautions Precautions: Fall Precaution Comments: instructed no pillow under knee Restrictions Weight Bearing Restrictions: No LLE Weight Bearing: Weight bearing as tolerated    Mobility  Bed Mobility Overal bed mobility: Needs Assistance Bed Mobility: Supine to Sit     Supine to sit: Supervision;Min guard     General bed mobility comments: demonstarted and educated how to use a belt to self assist LE    Transfers Overall transfer level: Needs assistance Equipment used: Rolling walker (2 wheeled) Transfers: Sit to/from [facility] Sit to Stand: Supervision Stand pivot transfers: Supervision;Min guard       General transfer comment: cues for LE management and safe hand placement; performed x 3  Ambulation/Gait Ambulation/Gait assistance: Supervision;Min guard Gait Distance (Feet): 18 Feet Assistive device: Rolling walker (2 wheeled) Gait Pattern/deviations: Step-to pattern;Decreased stance time - left;Shuffle;Trunk flexed Gait  velocity: decreased   General Gait Details: 25% VC's on safety with turns and proper walker to self distance.   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 TE's following HEP handout 10 reps B LE ankle pumps 05 reps towel squeezes 05 reps knee presses 05 reps heel slides  05 reps SAQ's 05 reps SLR's 05 reps ABD Educated on use of gait belt to assist with TE's Followed by ICE     General Comments        Pertinent Vitals/Pain Pain Assessment: 0-10 Pain Score: 5  Pain Location: L knee Pain Descriptors / Indicators: Aching;Sore;Discomfort;Operative site guarding Pain Intervention(s): Monitored during session;Premedicated before session;Repositioned;Ice applied    Home Living                      Prior Function            PT Goals (current  goals can now be found in the care plan section) Progress towards PT goals: Progressing toward goals    Frequency    7X/week      PT Plan Current plan remains appropriate    Co-evaluation              AM-PAC PT "6 Clicks" Mobility   Outcome Measure  Help needed turning from your back to your side while in a flat bed without using bedrails?: A Little Help needed moving from lying on your back to sitting on the side of a flat bed without using bedrails?: A Little Help needed moving to and from a bed to a chair (including a wheelchair)?: A Little Help needed standing up from a chair using your arms (e.g., wheelchair or bedside chair)?: A Little Help needed to walk in hospital room?: A  Little Help needed climbing 3-5 steps with a railing? : A Lot 6 Click Score: 17    End of Session Equipment Utilized During Treatment: Gait belt Activity Tolerance: Patient tolerated treatment well Patient left: with call bell/phone within reach;with bed alarm set;with SCD's reapplied;in chair Nurse Communication: Mobility status PT Visit Diagnosis: Other abnormalities of gait and mobility (R26.89);Unsteadiness on feet (R26.81);Muscle weakness (generalized) (M62.81)     Time: [id number] PT Time Calculation (min) (ACUTE ONLY): 25 min  Charges:  $Gait Training: 8-22 mins $Therapeutic Activity: 8-22 mins                     Rica Koyanagi  PTA Acute  Rehabilitation Services Pager      [phone] Office      [phone]

## 2021-01-25 NOTE — Progress Notes (Signed)
Physical Therapy Treatment Patient Details Name: Amy Moon MRN: [id number] DOB: February 07, 1951 Today's Date: 01/25/2021    History of Present Illness Pt is 70 yo female s/p L TKA on 01/21/21.  She has hx including but not limited to OA, osteoporosis, neuropathy, depression, kidney disease, chronic pain syndrome, Parkinson's disease, and obesity.    PT Comments    POD # 4 pm session Spouse present during session.  General transfer comment: cues for LE management and safe hand placement; had spouse "hands on" assist.  General Gait Details: 25% VC's on safety with turns and proper walker to self distance.General stair comments: with spouse present "hands on" up/down one step then instructed on threshold step. Then returned to room to perform some TE's following HEP handout.  Instructed on proper tech, freq as well as use of ICE.   Addressed all mobility questions, discussed appropriate activity, educated on use of ICE.  Pt ready for D/C to home.   Follow Up Recommendations  Follow surgeon's recommendation for DC plan and follow-up therapies;Supervision/Assistance - 24 hour     Equipment Recommendations  Rolling walker with 5" wheels;3in1 (PT) (delivered)    Recommendations for Other Services       Precautions / Restrictions Precautions Precautions: Fall Precaution Comments: instructed no pillow under knee Restrictions Weight Bearing Restrictions: No LLE Weight Bearing: Weight bearing as tolerated    Mobility  Bed Mobility   General bed mobility comments: OOB in recliner    Transfers Overall transfer level: Needs assistance Equipment used: Rolling walker (2 wheeled) Transfers: Sit to/from [facility] Sit to Stand: Supervision Stand pivot transfers: Supervision       General transfer comment: cues for LE management and safe hand placement; had spouse "hands on" assist  Ambulation/Gait Ambulation/Gait assistance: Supervision Gait Distance (Feet): 25  Feet Assistive device: Rolling walker (2 wheeled) Gait Pattern/deviations: Step-to pattern;Decreased stance time - left;Shuffle;Trunk flexed Gait velocity: decreased   General Gait Details: 25% VC's on safety with turns and proper walker to self distance.   Stairs Stairs: Yes Stairs assistance: Min assist Stair Management: No rails;Forwards Number of Stairs: 1 General stair comments: with spouse present "hands on" up/down one step then instructed on threshold step.   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 TE's following HEP handout 10 reps B LE ankle pumps 05 reps towel squeezes 05 reps knee presses 05 reps heel slides  05 reps SAQ's 05 reps SLR's 05 reps ABD Educated on use of gait belt to assist with TE's Followed by ICE     General Comments        Pertinent Vitals/Pain Pain Assessment: 0-10 Pain Score: 5  Pain Location: L knee Pain Descriptors / Indicators: Aching;Sore;Discomfort;Operative site guarding Pain Intervention(s): Monitored during session;Premedicated before session;Repositioned;Ice applied    Home  Living                      Prior Function            PT Goals (current goals can now be found in the care plan section) Progress towards PT goals: Progressing toward goals    Frequency    7X/week      PT Plan Current plan remains appropriate    Co-evaluation              AM-PAC PT "6 Clicks" Mobility   Outcome Measure  Help needed turning from your back to your side while in a flat bed without using bedrails?: A  Little Help needed moving from lying on your back to sitting on the side of a flat bed without using bedrails?: A Little Help needed moving to and from a bed to a chair (including a wheelchair)?: A Little Help needed standing up from a chair using your arms (e.g., wheelchair or bedside chair)?: A Little Help needed to walk in hospital room?: A Little Help needed climbing 3-5 steps with a railing? : A Lot 6 Click Score: 17    End of Session Equipment Utilized During Treatment: Gait belt Activity Tolerance: Patient tolerated treatment well Patient left: with call bell/phone within reach;with bed alarm set;with SCD's reapplied;in chair Nurse Communication: Mobility status PT Visit Diagnosis: Other abnormalities of gait and mobility (R26.89);Unsteadiness on feet (R26.81);Muscle weakness (generalized) (M62.81)     Time: 1430-1500 PT Time Calculation (min) (ACUTE ONLY): 30 min  Charges:  $Gait Training: 8-22 mins $Therapeutic Exercise: 8-22 mins                     Rica Koyanagi  PTA Acute  Rehabilitation Services Pager      [phone] Office      [phone]

## 2021-01-25 NOTE — Discharge Summary (Signed)
Patient ID: Amy Moon MRN: [id number] DOB/AGE: 09-14-1950 70 y.o.  Admit date: 01/21/2021 Discharge date: 01/25/2021  Admission Diagnoses:  Principal Problem:   Unilateral primary osteoarthritis, left knee Active Problems:   Status post total left knee replacement   Status post left knee replacement   Discharge Diagnoses:  Status post left total knee arthroplasty Chronic hyponatremia   Past Medical History:  Diagnosis Date   Allergy    Anemia    Angina pectoris ([place])    Arthritis    Asthma    years ago   Bronchitis 06/03/2018   Cancer ([place])    squamous cell carcinoma, nose   Cataract    Chest pain 07/30/2017   Chronic kidney disease    possibly per pt   Chronic pain syndrome 07/30/2017   Cirrhosis ([place])    Clotting disorder (HCC)    platelets are low   Coronary artery disease 04/02/2020   Depression    Drug-induced low platelet count    Duodenal ulcer    Dyslipidemia 04/02/2020   Dysphagia 09/03/2017   Dyspnea on exertion 10/17/2019   Encounter for hepatitis C virus screening test for high risk patient 11/05/2017   Encounter for Medicare annual wellness exam 05/09/2018   Enlarged thyroid    Family history of adverse reaction to anesthesia    father had difficulty waking up and breathing on his own after surgery   GERD (gastroesophageal reflux disease)    Granulomatous disease ([place])    Healthcare maintenance 07/30/2017   Hepatic disease    Hiatal hernia    History of exposure to asbestos 10/21/2019   History of kidney stones    History of smoking 25-50 pack years- quit age 80, smoked 2ppd from 70 yo-70 yo. 10/21/2019   History of surgery- pt has several Sx- no personal issues wiht anesthesia  10/21/2019   Hypertension    Irritant contact dermatitis 08/25/2015   Low vitamin D level    Migraine    Nausea 03/01/2016   Neuropathy    Osteopenia    Osteoporosis    Parkinson's disease ([place])    Peripheral vascular disease ([place])    Pneumonia    PONV  (postoperative nausea and vomiting)    Portal hypertension (HCC)    Severe episode of recurrent major depressive disorder, without psychotic features ([place]) 05/15/2016   Severe obesity (BMI >= 40) ([place]) 05/15/2016   Sleep disturbance 10/21/2019   Splenomegaly a   Stage 3 chronic kidney disease ([place]) 10/21/2019   Thrombocytopenia ([place]) 09/03/2017   Tremor of both hands 05/15/2016   Vitamin D deficiency 11/05/2017    Surgeries: Procedure(s): LEFT TOTAL KNEE ARTHROPLASTY on 01/21/2021   Consultants:   Discharged Condition: Improved  Hospital Course: Amy Moon is an 70 y.o. female who was admitted 01/21/2021 for operative treatment ofUnilateral primary osteoarthritis, left knee. Patient has severe unremitting pain that affects sleep, daily activities, and work/hobbies. After pre-op clearance the patient was taken to the operating room on 01/21/2021 and underwent  Procedure(s): LEFT TOTAL KNEE ARTHROPLASTY.    Patient was given perioperative antibiotics:  Anti-infectives (From admission, onward)    Start     Dose/Rate Route Frequency Ordered Stop   01/21/21 1800  clindamycin (CLEOCIN) IVPB 600 mg        600 mg 100 mL/hr over 30 Minutes Intravenous Every 6 hours 01/21/21 1606 01/22/21 0100   01/21/21 0900  clindamycin (CLEOCIN) IVPB 900 mg        900 mg 100 mL/hr over  30 Minutes Intravenous On call to O.R. 01/21/21 [id number] 01/21/21 1240        Patient was given sequential compression devices, early ambulation, and chemoprophylaxis to prevent DVT.  Patient benefited maximally from hospital stay and there were no complications.    Recent vital signs: Patient Vitals for the past 24 hrs:  BP Temp Temp src Pulse Resp SpO2  01/25/21 0526 (!) 116/54 97.8 F (36.6 C) Oral 85 17 97 %  01/24/21 2135 (!) 108/50 98.9 F (37.2 C) Oral 92 18 96 %  01/24/21 1446 104/63 -- -- 89 -- --  01/24/21 1323 (!) 87/42 98.6 F (37 C) Oral 91 18 96 %     Recent laboratory studies:  Recent Labs     01/25/21 0307  WBC 4.2  HGB 8.6*  HCT 25.5*  PLT 107*  NA 135  K 3.0*  CL 101  CO2 28  BUN 11  CREATININE 0.54  GLUCOSE 103*  CALCIUM 8.0*     Discharge Medications:   Allergies as of 01/25/2021       Reactions   Iodinated Diagnostic Agents Hives   Nsaids Other (See Comments)   History of bleeding ulcers   Latex Hives, Rash   Sinemet [carbidopa W-levodopa] Nausea And Vomiting   Inderal [propranolol] Other (See Comments)   Unknown   Indomethacin Other (See Comments)   Unknown   Penicillin G Other (See Comments)   Unknown   Pneumococcal Vaccines Other (See Comments)   Caused "pneumonia"   Scopolamine Other (See Comments)   The patch, caused her to pass out / change in mental status   Tape Other (See Comments)   Unknown        Medication List     STOP taking these medications    HYDROcodone-acetaminophen 5-325 MG tablet Commonly known as: NORCO/VICODIN       TAKE these medications    acetaminophen 650 MG CR tablet Commonly known as: TYLENOL Take 650 mg by mouth every 6 (six) hours as needed for pain.   atorvastatin 10 MG tablet Commonly known as: LIPITOR TAKE 1 TABLET (10 MG TOTAL) BY MOUTH DAILY.   bisacodyl 5 MG EC tablet Commonly known as: DULCOLAX Take 15 mg by mouth at bedtime as needed for moderate constipation.   clopidogrel 75 MG tablet Commonly known as: PLAVIX Take 1 tablet (75 mg total) by mouth daily.   diphenhydrAMINE 25 MG tablet Commonly known as: BENADRYL Take 25 mg by mouth every 6 (six) hours as needed for allergies.   furosemide 40 MG tablet Commonly known as: LASIX Take 1 tablet (40 mg total) by mouth daily.   losartan 50 MG tablet Commonly known as: COZAAR TAKE 1 TABLET (50 MG TOTAL) BY MOUTH DAILY.   nitroGLYCERIN 0.4 MG SL tablet Commonly known as: NITROSTAT Place 0.4 mg under the tongue every 5 (five) minutes as needed for chest pain.   oxyCODONE 5 MG immediate release tablet Commonly known as: Oxy  IR/ROXICODONE Take 1-2 tablets (5-10 mg total) by mouth every 4 (four) hours as needed for moderate pain (pain score 4-6).   potassium chloride 10 MEQ tablet Commonly known as: KLOR-CON TAKE 1 TABLET (10 MEQ TOTAL) BY MOUTH DAILY.   QUEtiapine 25 MG tablet Commonly known as: SEROquel Take 1 tablet (25 mg total) by mouth at bedtime.       ASK your doctor about these medications    cyclobenzaprine 10 MG tablet Commonly known as: FLEXERIL TAKE 1 TABLET BY MOUTH EVERY 8  HOURS AS NEEDED   pantoprazole 40 MG tablet Commonly known as: PROTONIX TAKE 1 TABLET BY MOUTH DAILY.   pregabalin 50 MG capsule Commonly known as: LYRICA TAKE 1 CAPSULE BY MOUTH 3 TIMES DAILY.   promethazine 25 MG tablet Commonly known as: PHENERGAN TAKE 1 TABLET BY MOUTH 4 TIMES DAILY AS NEEDED FOR NAUSEA OR VOMITING.   ranolazine 1000 MG SR tablet Commonly known as: RANEXA TAKE 1 TABLET BY MOUTH 2 TIMES DAILY.               Durable Medical Equipment  (From admission, onward)           Start     Ordered   01/21/21 1606  DME 3 n 1  Once        01/21/21 1606   01/21/21 1606  DME Walker rolling  Once       Question Answer Comment  Walker: With 5 Inch Wheels   Patient needs a walker to treat with the following condition Status post left knee replacement      01/21/21 1606            Diagnostic Studies: DG Knee Left Port  Result Date: 01/21/2021 CLINICAL DATA:  Status post left knee total arthroplasty. EXAM: PORTABLE LEFT KNEE - 1-2 VIEW COMPARISON:  None. FINDINGS: Left knee arthroplasty in expected alignment. No periprosthetic lucency or fracture. There has been patellar resurfacing. Recent postsurgical change includes air and edema in the soft tissues and joint space. Anterior skin staples. IMPRESSION: Left knee arthroplasty without immediate postoperative complication. Electronically Signed   By: Keith Rake M.D.   On: 01/21/2021 18:12    Disposition: Discharge disposition:  06-Home-Health Care Svc          Follow-up Information     Mcarthur Rossetti, MD. Go on 02/03/2021.   Specialty: Orthopedic Surgery Why: at 1:15 pm for your 2 week in office appointment with Dr. Clarita Leber information: [place] [id number] [phone]         [facility] Follow up.   Specialty: [facility] Why: to provide home health physical therapy visits Contact information: [address] [place] [address] [phone]                  Signed: Erskine Emery 01/25/2021, 8:08 AM

## 2021-01-25 NOTE — Care Management Important Message (Signed)
Important Message  Patient Details IM Letter given to the Patient. Name: Amy Moon MRN: [id number] Date of Birth: 1950-12-24   Medicare Important Message Given:  Yes     Kerin Salen 01/25/2021, 2:04 PM

## 2021-01-25 NOTE — Progress Notes (Signed)
Subjective: 4 Days Post-Op Procedure(s) (LRB): LEFT TOTAL KNEE ARTHROPLASTY (Left) Patient reports pain as moderate.    Objective: Vital signs in last 24 hours: Temp:  [97.8 F (36.6 C)-98.9 F (37.2 C)] 97.8 F (36.6 C) (08/02 0526) Pulse Rate:  [85-92] 85 (08/02 0526) Resp:  [17-18] 17 (08/02 0526) BP: (87-116)/(42-63) 116/54 (08/02 0526) SpO2:  [96 %-97 %] 97 % (08/02 0526)  Intake/Output from previous day: 08/01 0701 - 08/02 0700 In: 2879.3 [P.O.:880; I.V.:1499.3; IV Piggyback:500] Out: -  Intake/Output this shift: No intake/output data recorded.  Recent Labs    01/25/21 0307  HGB 8.6*   Recent Labs    01/25/21 0307  WBC 4.2  RBC 2.69*  HCT 25.5*  PLT 107*   Recent Labs    01/25/21 0307  NA 135  K 3.0*  CL 101  CO2 28  BUN 11  CREATININE 0.54  GLUCOSE 103*  CALCIUM 8.0*   No results for input(s): LABPT, INR in the last 72 hours.  Dorsiflexion/Plantar flexion intact Incision: scant drainage Compartment soft   Assessment/Plan: 4 Days Post-Op Procedure(s) (LRB): LEFT TOTAL KNEE ARTHROPLASTY (Left) Up with therapy possible discharge to home later today if stable and safe from physical therapy standpoint.       Amy Moon 01/25/2021, 8:02 AM

## 2021-01-26 ENCOUNTER — Telehealth: Payer: Self-pay | Admitting: *Deleted

## 2021-01-26 NOTE — Care Plan (Signed)
Ortho bundle D/C call to patient. She was discharged from hospital on 01/25/21 after knee replacement on 01/21/21 and extended stay through the weekend. She states she did hear from [facility], but told them not to come out until Monday. Patient sounds very medicated on the phone and states she has had a bad night. Says she is moving her knee and is not worried about it becoming stiff if she doesn't have therapy. Reminded of MD appointment and to call with concerns. Will reach out to [facility] also to confirm that patient has asked them to not come for several days. Attempted to persuade patient to let therapy come to her home and work with her, but she refused, stating she is tired.

## 2021-01-27 ENCOUNTER — Other Ambulatory Visit: Payer: Self-pay | Admitting: Orthopaedic Surgery

## 2021-01-31 ENCOUNTER — Telehealth: Payer: Self-pay | Admitting: *Deleted

## 2021-01-31 ENCOUNTER — Other Ambulatory Visit: Payer: Self-pay | Admitting: Physician Assistant

## 2021-01-31 DIAGNOSIS — K766 Portal hypertension: Secondary | ICD-10-CM | POA: Diagnosis not present

## 2021-01-31 DIAGNOSIS — I739 Peripheral vascular disease, unspecified: Secondary | ICD-10-CM | POA: Diagnosis not present

## 2021-01-31 DIAGNOSIS — G20 Parkinson's disease: Secondary | ICD-10-CM | POA: Diagnosis not present

## 2021-01-31 DIAGNOSIS — D631 Anemia in chronic kidney disease: Secondary | ICD-10-CM | POA: Diagnosis not present

## 2021-01-31 DIAGNOSIS — K219 Gastro-esophageal reflux disease without esophagitis: Secondary | ICD-10-CM | POA: Diagnosis not present

## 2021-01-31 DIAGNOSIS — K7460 Unspecified cirrhosis of liver: Secondary | ICD-10-CM | POA: Diagnosis not present

## 2021-01-31 DIAGNOSIS — Z931 Gastrostomy status: Secondary | ICD-10-CM | POA: Diagnosis not present

## 2021-01-31 DIAGNOSIS — I2510 Atherosclerotic heart disease of native coronary artery without angina pectoris: Secondary | ICD-10-CM | POA: Diagnosis not present

## 2021-01-31 DIAGNOSIS — Z96652 Presence of left artificial knee joint: Secondary | ICD-10-CM | POA: Diagnosis not present

## 2021-01-31 DIAGNOSIS — G43909 Migraine, unspecified, not intractable, without status migrainosus: Secondary | ICD-10-CM | POA: Diagnosis not present

## 2021-01-31 DIAGNOSIS — E871 Hypo-osmolality and hyponatremia: Secondary | ICD-10-CM | POA: Diagnosis not present

## 2021-01-31 DIAGNOSIS — E559 Vitamin D deficiency, unspecified: Secondary | ICD-10-CM | POA: Diagnosis not present

## 2021-01-31 DIAGNOSIS — Z7902 Long term (current) use of antithrombotics/antiplatelets: Secondary | ICD-10-CM | POA: Diagnosis not present

## 2021-01-31 DIAGNOSIS — Z471 Aftercare following joint replacement surgery: Secondary | ICD-10-CM | POA: Diagnosis not present

## 2021-01-31 DIAGNOSIS — G629 Polyneuropathy, unspecified: Secondary | ICD-10-CM | POA: Diagnosis not present

## 2021-01-31 DIAGNOSIS — G894 Chronic pain syndrome: Secondary | ICD-10-CM | POA: Diagnosis not present

## 2021-01-31 DIAGNOSIS — I129 Hypertensive chronic kidney disease with stage 1 through stage 4 chronic kidney disease, or unspecified chronic kidney disease: Secondary | ICD-10-CM | POA: Diagnosis not present

## 2021-01-31 DIAGNOSIS — J45909 Unspecified asthma, uncomplicated: Secondary | ICD-10-CM | POA: Diagnosis not present

## 2021-01-31 DIAGNOSIS — M810 Age-related osteoporosis without current pathological fracture: Secondary | ICD-10-CM | POA: Diagnosis not present

## 2021-01-31 DIAGNOSIS — N183 Chronic kidney disease, stage 3 unspecified: Secondary | ICD-10-CM | POA: Diagnosis not present

## 2021-01-31 NOTE — Telephone Encounter (Signed)
Attempted Ortho bundle call on 01/28/21-No answer and no way to leave a VM.

## 2021-01-31 NOTE — Telephone Encounter (Signed)
Ortho bundle 7 day call completed. Unable to reach patient by phone. Attempted twice with no answer and no way to leave a VM.

## 2021-01-31 NOTE — Telephone Encounter (Signed)
Ortho bundle 7 day call completed and confirmed HHPT has been established today.

## 2021-02-01 ENCOUNTER — Other Ambulatory Visit: Payer: Self-pay | Admitting: Orthopaedic Surgery

## 2021-02-01 ENCOUNTER — Other Ambulatory Visit: Payer: Self-pay | Admitting: Specialist

## 2021-02-01 ENCOUNTER — Telehealth: Payer: Self-pay | Admitting: *Deleted

## 2021-02-01 DIAGNOSIS — N183 Chronic kidney disease, stage 3 unspecified: Secondary | ICD-10-CM | POA: Diagnosis not present

## 2021-02-01 DIAGNOSIS — Z931 Gastrostomy status: Secondary | ICD-10-CM | POA: Diagnosis not present

## 2021-02-01 DIAGNOSIS — E559 Vitamin D deficiency, unspecified: Secondary | ICD-10-CM | POA: Diagnosis not present

## 2021-02-01 DIAGNOSIS — G629 Polyneuropathy, unspecified: Secondary | ICD-10-CM | POA: Diagnosis not present

## 2021-02-01 DIAGNOSIS — M810 Age-related osteoporosis without current pathological fracture: Secondary | ICD-10-CM | POA: Diagnosis not present

## 2021-02-01 DIAGNOSIS — Z7902 Long term (current) use of antithrombotics/antiplatelets: Secondary | ICD-10-CM | POA: Diagnosis not present

## 2021-02-01 DIAGNOSIS — G43909 Migraine, unspecified, not intractable, without status migrainosus: Secondary | ICD-10-CM | POA: Diagnosis not present

## 2021-02-01 DIAGNOSIS — Z96652 Presence of left artificial knee joint: Secondary | ICD-10-CM | POA: Diagnosis not present

## 2021-02-01 DIAGNOSIS — G894 Chronic pain syndrome: Secondary | ICD-10-CM | POA: Diagnosis not present

## 2021-02-01 DIAGNOSIS — E871 Hypo-osmolality and hyponatremia: Secondary | ICD-10-CM | POA: Diagnosis not present

## 2021-02-01 DIAGNOSIS — J45909 Unspecified asthma, uncomplicated: Secondary | ICD-10-CM | POA: Diagnosis not present

## 2021-02-01 DIAGNOSIS — I129 Hypertensive chronic kidney disease with stage 1 through stage 4 chronic kidney disease, or unspecified chronic kidney disease: Secondary | ICD-10-CM | POA: Diagnosis not present

## 2021-02-01 DIAGNOSIS — D631 Anemia in chronic kidney disease: Secondary | ICD-10-CM | POA: Diagnosis not present

## 2021-02-01 DIAGNOSIS — G20 Parkinson's disease: Secondary | ICD-10-CM | POA: Diagnosis not present

## 2021-02-01 DIAGNOSIS — Z471 Aftercare following joint replacement surgery: Secondary | ICD-10-CM | POA: Diagnosis not present

## 2021-02-01 DIAGNOSIS — K7460 Unspecified cirrhosis of liver: Secondary | ICD-10-CM | POA: Diagnosis not present

## 2021-02-01 DIAGNOSIS — K766 Portal hypertension: Secondary | ICD-10-CM | POA: Diagnosis not present

## 2021-02-01 DIAGNOSIS — K219 Gastro-esophageal reflux disease without esophagitis: Secondary | ICD-10-CM | POA: Diagnosis not present

## 2021-02-01 DIAGNOSIS — I739 Peripheral vascular disease, unspecified: Secondary | ICD-10-CM | POA: Diagnosis not present

## 2021-02-01 DIAGNOSIS — I2510 Atherosclerotic heart disease of native coronary artery without angina pectoris: Secondary | ICD-10-CM | POA: Diagnosis not present

## 2021-02-01 MED ORDER — CYCLOBENZAPRINE HCL 10 MG PO TABS: 10.0000 mg | ORAL_TABLET | Freq: Three times a day (TID) | ORAL | 1 refills | Status: DC | PRN

## 2021-02-01 MED ORDER — OXYCODONE HCL 5 MG PO TABS: 5.0000 mg | ORAL_TABLET | Freq: Four times a day (QID) | ORAL | 0 refills | Status: DC | PRN

## 2021-02-01 NOTE — Telephone Encounter (Signed)
Patient told HHPT that she never picked up her pain medication after surgery, but I confirmed she did with [facility]. She was a little confused regarding this. She then said she picked up but thinks she threw it away by accident because they are having construction done at their house. Looks like she typically gets refills of Norco from Dr. Louanne Skye (I had them cancel this for now). She is in need of a refill of her pain medication and muscle relaxer (I think she uses Flexeril per Dr. Louanne Skye). Therapy was a week delayed b/c she would not let them come. She has an appointment with you here on Thursday, so we can decide when to start OPPT. Therapy saw her for first time yesterday (10 days post op). Thanks.

## 2021-02-01 NOTE — Telephone Encounter (Signed)
**  PLEASE DENY THIS AT THIS TIME--SHE IS S/P TKA WITH DR. BLACKMAN AT THIS TIME. [place]

## 2021-02-03 ENCOUNTER — Encounter: Payer: Medicare Other | Admitting: Orthopaedic Surgery

## 2021-02-03 ENCOUNTER — Telehealth: Payer: Self-pay | Admitting: Orthopaedic Surgery

## 2021-02-03 NOTE — Telephone Encounter (Signed)
Rescheduled appt for next week

## 2021-02-03 NOTE — Telephone Encounter (Signed)
Pt called and states she is feeling very sick. She needs to reschedule her post op appt and there is nothing available for next week. She can do any day but 18th  CB [phone]

## 2021-02-04 ENCOUNTER — Telehealth: Payer: Self-pay | Admitting: *Deleted

## 2021-02-04 DIAGNOSIS — M810 Age-related osteoporosis without current pathological fracture: Secondary | ICD-10-CM | POA: Diagnosis not present

## 2021-02-04 DIAGNOSIS — Z931 Gastrostomy status: Secondary | ICD-10-CM | POA: Diagnosis not present

## 2021-02-04 DIAGNOSIS — Z96652 Presence of left artificial knee joint: Secondary | ICD-10-CM | POA: Diagnosis not present

## 2021-02-04 DIAGNOSIS — G20 Parkinson's disease: Secondary | ICD-10-CM | POA: Diagnosis not present

## 2021-02-04 DIAGNOSIS — I129 Hypertensive chronic kidney disease with stage 1 through stage 4 chronic kidney disease, or unspecified chronic kidney disease: Secondary | ICD-10-CM | POA: Diagnosis not present

## 2021-02-04 DIAGNOSIS — E559 Vitamin D deficiency, unspecified: Secondary | ICD-10-CM | POA: Diagnosis not present

## 2021-02-04 DIAGNOSIS — G43909 Migraine, unspecified, not intractable, without status migrainosus: Secondary | ICD-10-CM | POA: Diagnosis not present

## 2021-02-04 DIAGNOSIS — Z7902 Long term (current) use of antithrombotics/antiplatelets: Secondary | ICD-10-CM | POA: Diagnosis not present

## 2021-02-04 DIAGNOSIS — E871 Hypo-osmolality and hyponatremia: Secondary | ICD-10-CM | POA: Diagnosis not present

## 2021-02-04 DIAGNOSIS — K7460 Unspecified cirrhosis of liver: Secondary | ICD-10-CM | POA: Diagnosis not present

## 2021-02-04 DIAGNOSIS — I739 Peripheral vascular disease, unspecified: Secondary | ICD-10-CM | POA: Diagnosis not present

## 2021-02-04 DIAGNOSIS — J45909 Unspecified asthma, uncomplicated: Secondary | ICD-10-CM | POA: Diagnosis not present

## 2021-02-04 DIAGNOSIS — Z471 Aftercare following joint replacement surgery: Secondary | ICD-10-CM | POA: Diagnosis not present

## 2021-02-04 DIAGNOSIS — G894 Chronic pain syndrome: Secondary | ICD-10-CM | POA: Diagnosis not present

## 2021-02-04 DIAGNOSIS — K219 Gastro-esophageal reflux disease without esophagitis: Secondary | ICD-10-CM | POA: Diagnosis not present

## 2021-02-04 DIAGNOSIS — D631 Anemia in chronic kidney disease: Secondary | ICD-10-CM | POA: Diagnosis not present

## 2021-02-04 DIAGNOSIS — G629 Polyneuropathy, unspecified: Secondary | ICD-10-CM | POA: Diagnosis not present

## 2021-02-04 DIAGNOSIS — I2510 Atherosclerotic heart disease of native coronary artery without angina pectoris: Secondary | ICD-10-CM | POA: Diagnosis not present

## 2021-02-04 DIAGNOSIS — N183 Chronic kidney disease, stage 3 unspecified: Secondary | ICD-10-CM | POA: Diagnosis not present

## 2021-02-04 DIAGNOSIS — K766 Portal hypertension: Secondary | ICD-10-CM | POA: Diagnosis not present

## 2021-02-04 NOTE — Telephone Encounter (Signed)
Ortho bundle 14 day call completed. 

## 2021-02-05 DIAGNOSIS — G43909 Migraine, unspecified, not intractable, without status migrainosus: Secondary | ICD-10-CM | POA: Diagnosis not present

## 2021-02-05 DIAGNOSIS — G629 Polyneuropathy, unspecified: Secondary | ICD-10-CM | POA: Diagnosis not present

## 2021-02-05 DIAGNOSIS — G894 Chronic pain syndrome: Secondary | ICD-10-CM | POA: Diagnosis not present

## 2021-02-05 DIAGNOSIS — Z7902 Long term (current) use of antithrombotics/antiplatelets: Secondary | ICD-10-CM | POA: Diagnosis not present

## 2021-02-05 DIAGNOSIS — K7460 Unspecified cirrhosis of liver: Secondary | ICD-10-CM | POA: Diagnosis not present

## 2021-02-05 DIAGNOSIS — E559 Vitamin D deficiency, unspecified: Secondary | ICD-10-CM | POA: Diagnosis not present

## 2021-02-05 DIAGNOSIS — I2510 Atherosclerotic heart disease of native coronary artery without angina pectoris: Secondary | ICD-10-CM | POA: Diagnosis not present

## 2021-02-05 DIAGNOSIS — Z96652 Presence of left artificial knee joint: Secondary | ICD-10-CM | POA: Diagnosis not present

## 2021-02-05 DIAGNOSIS — Z471 Aftercare following joint replacement surgery: Secondary | ICD-10-CM | POA: Diagnosis not present

## 2021-02-05 DIAGNOSIS — I129 Hypertensive chronic kidney disease with stage 1 through stage 4 chronic kidney disease, or unspecified chronic kidney disease: Secondary | ICD-10-CM | POA: Diagnosis not present

## 2021-02-05 DIAGNOSIS — I739 Peripheral vascular disease, unspecified: Secondary | ICD-10-CM | POA: Diagnosis not present

## 2021-02-05 DIAGNOSIS — Z931 Gastrostomy status: Secondary | ICD-10-CM | POA: Diagnosis not present

## 2021-02-05 DIAGNOSIS — M810 Age-related osteoporosis without current pathological fracture: Secondary | ICD-10-CM | POA: Diagnosis not present

## 2021-02-05 DIAGNOSIS — D631 Anemia in chronic kidney disease: Secondary | ICD-10-CM | POA: Diagnosis not present

## 2021-02-05 DIAGNOSIS — N183 Chronic kidney disease, stage 3 unspecified: Secondary | ICD-10-CM | POA: Diagnosis not present

## 2021-02-05 DIAGNOSIS — K766 Portal hypertension: Secondary | ICD-10-CM | POA: Diagnosis not present

## 2021-02-05 DIAGNOSIS — E871 Hypo-osmolality and hyponatremia: Secondary | ICD-10-CM | POA: Diagnosis not present

## 2021-02-05 DIAGNOSIS — J45909 Unspecified asthma, uncomplicated: Secondary | ICD-10-CM | POA: Diagnosis not present

## 2021-02-05 DIAGNOSIS — K219 Gastro-esophageal reflux disease without esophagitis: Secondary | ICD-10-CM | POA: Diagnosis not present

## 2021-02-05 DIAGNOSIS — G20 Parkinson's disease: Secondary | ICD-10-CM | POA: Diagnosis not present

## 2021-02-07 NOTE — Telephone Encounter (Signed)
Ortho bundle call completed. 

## 2021-02-09 ENCOUNTER — Encounter: Payer: Self-pay | Admitting: Orthopaedic Surgery

## 2021-02-09 ENCOUNTER — Ambulatory Visit (INDEPENDENT_AMBULATORY_CARE_PROVIDER_SITE_OTHER): Payer: Medicare Other | Admitting: Orthopaedic Surgery

## 2021-02-09 ENCOUNTER — Other Ambulatory Visit: Payer: Self-pay

## 2021-02-09 ENCOUNTER — Telehealth: Payer: Self-pay | Admitting: *Deleted

## 2021-02-09 DIAGNOSIS — Z96652 Presence of left artificial knee joint: Secondary | ICD-10-CM

## 2021-02-09 NOTE — Telephone Encounter (Signed)
Ortho bundle 14 day in office meeting completed. °

## 2021-02-09 NOTE — Progress Notes (Signed)
The patient is to have week status post a left total knee arthroplasty.  She is struggled with pain and is now not sleeping.  She is lost about 12 pounds she states in surgery.  She is 69 years old.  Home health therapy has come to her house but has not done much because of her reluctance to have this scheduled and noticed things are going on with her life.  Her husband is scheduled to have significant neck surgery in the near future.  She is reluctant to go to outpatient therapy.  On exam her left knee incision looks good.  The Steri-Strips are in place.  Her extension is approximately almost full and her flexion is to around 95 degrees.  Her calf is soft.  She is struggling with keeping her eyes open he can tell that she is tired.  She is on oxycodone and Flexeril as well as Lyrica.  I am hesitant to recommend any other medications other than some melatonin or Benadryl to help her sleep.  I explained this to her in detail as well.  I also explained the importance of getting her knee moving is much as possible.  I empathized with her not wanting to go anywhere else in terms of outpatient therapy given her husband's diagnosis and the surgery he has coming up.  She will continue to push herself hard through home exercise program once home therapy is done.  I am encouraged by her exam today in terms of her motion.  We will see her back in 4 weeks to see how she is doing overall but no x-rays are needed.

## 2021-02-15 ENCOUNTER — Other Ambulatory Visit: Payer: Self-pay | Admitting: Orthopaedic Surgery

## 2021-02-15 ENCOUNTER — Telehealth: Payer: Self-pay | Admitting: *Deleted

## 2021-02-15 MED ORDER — OXYCODONE HCL 5 MG PO TABS: 5.0000 mg | ORAL_TABLET | Freq: Four times a day (QID) | ORAL | 0 refills | Status: DC | PRN

## 2021-02-15 NOTE — Telephone Encounter (Signed)
Patient called requesting refill of pain medication. Thanks.

## 2021-02-15 NOTE — Telephone Encounter (Signed)
Call to patient and updated that new refill  sent to pharmacy per Dr. Ninfa Linden.

## 2021-02-16 ENCOUNTER — Ambulatory Visit: Payer: Medicare Other | Admitting: Cardiology

## 2021-02-21 ENCOUNTER — Other Ambulatory Visit: Payer: Self-pay | Admitting: Orthopaedic Surgery

## 2021-02-21 NOTE — Telephone Encounter (Signed)
ok 

## 2021-02-22 ENCOUNTER — Telehealth: Payer: Self-pay | Admitting: *Deleted

## 2021-02-22 NOTE — Telephone Encounter (Signed)
Attempted Ortho bundle 30 day call; no answer and left VM.

## 2021-03-03 ENCOUNTER — Ambulatory Visit (INDEPENDENT_AMBULATORY_CARE_PROVIDER_SITE_OTHER): Payer: Medicare Other | Admitting: Nurse Practitioner

## 2021-03-03 ENCOUNTER — Other Ambulatory Visit: Payer: Self-pay

## 2021-03-03 ENCOUNTER — Encounter: Payer: Self-pay | Admitting: Nurse Practitioner

## 2021-03-03 VITALS — Temp 99.0°F | Ht 60.0 in | Wt 163.8 lb

## 2021-03-03 DIAGNOSIS — L209 Atopic dermatitis, unspecified: Secondary | ICD-10-CM

## 2021-03-03 DIAGNOSIS — Z1152 Encounter for screening for COVID-19: Secondary | ICD-10-CM | POA: Diagnosis not present

## 2021-03-03 DIAGNOSIS — L03116 Cellulitis of left lower limb: Secondary | ICD-10-CM | POA: Diagnosis not present

## 2021-03-03 MED ORDER — DOXYCYCLINE HYCLATE 100 MG PO TABS: 100.0000 mg | ORAL_TABLET | Freq: Two times a day (BID) | ORAL | 0 refills | Status: DC

## 2021-03-03 MED ORDER — CLOBETASOL PROPIONATE 0.05 % EX CREA: 1.0000 "application " | TOPICAL_CREAM | Freq: Two times a day (BID) | CUTANEOUS | 0 refills | Status: DC

## 2021-03-03 NOTE — Progress Notes (Signed)
Virtual Visit via Telephone Note  I connected with Amy Moon on 03/03/21 at  3:10 PM EDT by telephone and verified that I am speaking with the correct person using two identifiers.  Location: Patient: home Provider: [place] [facility] at [facility]     I discussed the limitations, risks, security and privacy concerns of performing an evaluation and management service by telephone and the availability of in person appointments. I also discussed with the patient that there may be a patient responsible charge related to this service. The patient expressed understanding and agreed to proceed.   History of Present Illness: The patient presents for acute visit.  She has a rash which is comprised of flat blotches on her hands and on her legs.  She states they have formed along the scar of her total knee which she had done on 01/21/2021.  She states the scar is warm to touch and swollen.  She states that it was in the surgeon since she had a total knee replacement.  She states that rash developed after she had surgery for knee replacement.  She states that she will be running a low-grade fever of 99.0.  The highest is 100.0.  She states that fever started when she first noted the rash which was new to beginning of August.  She states she feels "yuck" from fever.  She gets nauseous but does not vomit.  She states the rash is very itchy and irritating.  She states she there is to take people's hands as well she has he has a rash.  She denies chest pain, chest pressure, shortness of breath.  She denies sore throat or cough.  She has been swabbed for COVID-19 in the parking lot prior to phone visit.   Observations/Objective:  The patient is alert and oriented. She is pleasant and answers all questions appropriately. Breathing is non-labored. She is in no acute distress at this time.    Today's Vitals   03/03/21 1546  Temp: 99 F (37.2 C)  Weight: 163 lb 12.8 oz (74.3 kg)  Height: 5' (1.524 m)    Body mass index is 31.99 kg/m.   Assessment and Plan: 1. Cellulitis of left lower extremity Suspect development of cellulitis along scar from left total knee replacement surgery which was done 01/21/2021.  Start doxycycline 100 mg tablets twice daily for next days.  Recommend she rest and elevate her knee when possible.  Strongly encouraged her to contact surgeon for further evaluation and treatment. - doxycycline (VIBRA-TABS) 100 MG tablet; Take 1 tablet (100 mg total) by mouth 2 (two) times daily.  Dispense: 14 tablet; Refill: 0  2. Atopic dermatitis, unspecified type Start clobetasol cream.  Apply this twice daily as needed to all affected areas. - clobetasol cream (TEMOVATE) 0.05 %; Apply 1 application topically 2 (two) times daily.  Dispense: 30 g; Refill: 0  3. Encounter for screening for COVID-19 Testing for COVID-19 done prior to this visit. - Novel Coronavirus, NAA ([facility])   Follow Up Instructions:  This note was dictated using Systems analyst. Rapid proofreading was performed to expedite the delivery of the information. Despite proofreading, phonetic errors will occur which are common with this voice recognition software. Please take this into consideration. If there are any concerns, please contact our office.     I discussed the assessment and treatment plan with the patient. The patient was provided an opportunity to ask questions and all were answered. The patient agreed with the  plan and demonstrated an understanding of the instructions.   The patient was advised to call back or seek an in-person evaluation if the symptoms worsen or if the condition fails to improve as anticipated.  I provided 10 minutes of non-face-to-face time during this encounter.   Ronnell Freshwater, NP

## 2021-03-04 LAB — NOVEL CORONAVIRUS, NAA: SARS-CoV-2, NAA: NOT DETECTED

## 2021-03-04 LAB — SARS-COV-2, NAA 2 DAY TAT

## 2021-03-07 ENCOUNTER — Other Ambulatory Visit: Payer: Self-pay | Admitting: Orthopaedic Surgery

## 2021-03-07 ENCOUNTER — Telehealth: Payer: Self-pay | Admitting: *Deleted

## 2021-03-07 ENCOUNTER — Telehealth: Payer: Self-pay | Admitting: Orthopaedic Surgery

## 2021-03-07 MED ORDER — HYDROCODONE-ACETAMINOPHEN 5-325 MG PO TABS: 1.0000 | ORAL_TABLET | Freq: Four times a day (QID) | ORAL | 0 refills | Status: DC | PRN

## 2021-03-07 NOTE — Telephone Encounter (Signed)
Patient called and requested refill of pain medication. She wanted something less strong than the Oxycodone she states. Pharmacy [facility].

## 2021-03-07 NOTE — Telephone Encounter (Signed)
Err

## 2021-03-09 ENCOUNTER — Ambulatory Visit (INDEPENDENT_AMBULATORY_CARE_PROVIDER_SITE_OTHER): Payer: Medicare Other | Admitting: Orthopaedic Surgery

## 2021-03-09 ENCOUNTER — Ambulatory Visit (INDEPENDENT_AMBULATORY_CARE_PROVIDER_SITE_OTHER): Payer: Medicare Other

## 2021-03-09 ENCOUNTER — Encounter: Payer: Self-pay | Admitting: Orthopaedic Surgery

## 2021-03-09 DIAGNOSIS — Z96652 Presence of left artificial knee joint: Secondary | ICD-10-CM | POA: Diagnosis not present

## 2021-03-09 NOTE — Progress Notes (Signed)
The patient is now just over 6 weeks status post a left total knee arthroplasty.  Actually in 2 days she will be 7 weeks out from surgery.  She is 70 years old and very active.  She did have a recent mechanical fall 2 weeks ago and developed more pain in the left knee she she did want some x-rays today.  She is walking without an assistive device.  She is still taking some hydrocodone but intermittently.  On examination she has excellent range of motion at this point with almost full extension to full flexion of the left knee.  It feels ligamentously stable.  2 views left knee show no complicating features of the knee replacement and no evidence of acute findings in light of her recent fall.  I gave her reassurance the pain and swelling are to be expected at 6 weeks and she is doing well overall.  We can refill her hydrocodone when she needs it again.  She will let us know.  I would like to see her back in 4 weeks to see how she is doing overall but no x-rays are needed.  If she looks good at that visit we would not need to see her for 3 to 6 months.

## 2021-03-10 ENCOUNTER — Encounter: Payer: Self-pay | Admitting: Nurse Practitioner

## 2021-03-10 NOTE — Progress Notes (Signed)
Negative results for COVID 19

## 2021-03-15 ENCOUNTER — Other Ambulatory Visit: Payer: Self-pay | Admitting: Orthopaedic Surgery

## 2021-03-15 DIAGNOSIS — L03116 Cellulitis of left lower limb: Secondary | ICD-10-CM | POA: Insufficient documentation

## 2021-03-15 DIAGNOSIS — Z1152 Encounter for screening for COVID-19: Secondary | ICD-10-CM | POA: Insufficient documentation

## 2021-03-15 DIAGNOSIS — L209 Atopic dermatitis, unspecified: Secondary | ICD-10-CM

## 2021-03-15 HISTORY — DX: Encounter for screening for COVID-19: Z11.52

## 2021-03-15 HISTORY — DX: Cellulitis of left lower limb: L03.116

## 2021-03-15 HISTORY — DX: Atopic dermatitis, unspecified: L20.9

## 2021-03-15 NOTE — Telephone Encounter (Signed)
ok 

## 2021-03-21 ENCOUNTER — Ambulatory Visit: Payer: Medicare Other

## 2021-03-21 ENCOUNTER — Other Ambulatory Visit: Payer: Self-pay | Admitting: Orthopaedic Surgery

## 2021-03-21 ENCOUNTER — Other Ambulatory Visit: Payer: Self-pay | Admitting: Physician Assistant

## 2021-03-21 DIAGNOSIS — I10 Essential (primary) hypertension: Secondary | ICD-10-CM

## 2021-03-21 NOTE — Telephone Encounter (Signed)
ok 

## 2021-03-28 ENCOUNTER — Telehealth: Payer: Self-pay | Admitting: *Deleted

## 2021-03-28 ENCOUNTER — Other Ambulatory Visit: Payer: Self-pay | Admitting: Orthopaedic Surgery

## 2021-03-28 MED ORDER — HYDROCODONE-ACETAMINOPHEN 5-325 MG PO TABS: ORAL_TABLET | ORAL | 0 refills | Status: DC

## 2021-03-28 NOTE — Telephone Encounter (Signed)
Patient called requesting refill of pain medication. States with weather, pain has increased over the last week. Taking 2 at one time every 6 hrs. Still has a few left-[facility].Thanks.

## 2021-04-04 ENCOUNTER — Telehealth: Payer: Self-pay | Admitting: *Deleted

## 2021-04-04 ENCOUNTER — Other Ambulatory Visit: Payer: Self-pay | Admitting: Physician Assistant

## 2021-04-04 MED ORDER — HYDROCODONE-ACETAMINOPHEN 5-325 MG PO TABS: ORAL_TABLET | ORAL | 0 refills | Status: DC

## 2021-04-04 NOTE — Telephone Encounter (Signed)
Patient called requesting a refill of pain medication. States she is using sparingly, but will need refill prior to her appt here on Wednesday. Thanks.

## 2021-04-06 ENCOUNTER — Encounter: Payer: Self-pay | Admitting: Orthopaedic Surgery

## 2021-04-06 ENCOUNTER — Other Ambulatory Visit: Payer: Self-pay

## 2021-04-06 ENCOUNTER — Ambulatory Visit (INDEPENDENT_AMBULATORY_CARE_PROVIDER_SITE_OTHER): Payer: Medicare Other | Admitting: Orthopaedic Surgery

## 2021-04-06 DIAGNOSIS — Z96652 Presence of left artificial knee joint: Secondary | ICD-10-CM

## 2021-04-06 MED ORDER — CYCLOBENZAPRINE HCL 10 MG PO TABS: 10.0000 mg | ORAL_TABLET | Freq: Three times a day (TID) | ORAL | 1 refills | Status: DC | PRN

## 2021-04-06 NOTE — Progress Notes (Signed)
The patient is a 70 year old female who is now 10 weeks status post a left total knee arthroplasty.  She says she does have improved range of motion and strength and is getting better overall.  She has been released by therapy.  She is requesting a refill of Flexeril today.  Also refilled her pain medication yesterday.  She is walking without an assistive device.  Her husband does have major bypass surgery coming up.  Examination of her left operative knee shows full range of motion of the knee.  It is still slightly warm and swollen to be expected at this amount of time postoperative.  Her calf is soft.  From my standpoint I will need to see her back for 6 months unless there are issues.  At that visit we will have an AP and lateral of her left operative knee.  All questions and concerns were answered and addressed.  We can refill her medications still as needed.

## 2021-04-12 ENCOUNTER — Other Ambulatory Visit: Payer: Self-pay | Admitting: Physician Assistant

## 2021-04-12 MED ORDER — HYDROCODONE-ACETAMINOPHEN 5-325 MG PO TABS: ORAL_TABLET | ORAL | 0 refills | Status: DC

## 2021-04-20 ENCOUNTER — Ambulatory Visit: Payer: Medicare Other | Admitting: Physician Assistant

## 2021-04-20 ENCOUNTER — Other Ambulatory Visit: Payer: Self-pay | Admitting: Physician Assistant

## 2021-04-20 ENCOUNTER — Telehealth: Payer: Self-pay | Admitting: *Deleted

## 2021-04-20 NOTE — Telephone Encounter (Signed)
Ortho bundle 90 day call completed. 

## 2021-04-28 ENCOUNTER — Other Ambulatory Visit: Payer: Self-pay | Admitting: Physician Assistant

## 2021-05-09 ENCOUNTER — Other Ambulatory Visit: Payer: Self-pay | Admitting: Physician Assistant

## 2021-05-09 ENCOUNTER — Telehealth: Payer: Self-pay | Admitting: *Deleted

## 2021-05-09 NOTE — Telephone Encounter (Signed)
Ortho bundle call completed. 

## 2021-05-13 ENCOUNTER — Other Ambulatory Visit: Payer: Self-pay

## 2021-05-13 ENCOUNTER — Telehealth: Payer: Self-pay | Admitting: Gastroenterology

## 2021-05-13 ENCOUNTER — Ambulatory Visit: Payer: Medicare Other | Admitting: Gastroenterology

## 2021-05-13 ENCOUNTER — Telehealth (INDEPENDENT_AMBULATORY_CARE_PROVIDER_SITE_OTHER): Payer: Medicare Other | Admitting: Gastroenterology

## 2021-05-13 VITALS — Ht 60.0 in

## 2021-05-13 DIAGNOSIS — K8020 Calculus of gallbladder without cholecystitis without obstruction: Secondary | ICD-10-CM

## 2021-05-13 DIAGNOSIS — R1011 Right upper quadrant pain: Secondary | ICD-10-CM | POA: Diagnosis not present

## 2021-05-13 DIAGNOSIS — K219 Gastro-esophageal reflux disease without esophagitis: Secondary | ICD-10-CM | POA: Diagnosis not present

## 2021-05-13 DIAGNOSIS — R110 Nausea: Secondary | ICD-10-CM

## 2021-05-13 DIAGNOSIS — K5909 Other constipation: Secondary | ICD-10-CM | POA: Diagnosis not present

## 2021-05-13 MED ORDER — PROMETHAZINE HCL 25 MG PO TABS: 25.0000 mg | ORAL_TABLET | Freq: Four times a day (QID) | ORAL | 0 refills | Status: DC | PRN

## 2021-05-13 MED ORDER — PANTOPRAZOLE SODIUM 40 MG PO TBEC
40.0000 mg | DELAYED_RELEASE_TABLET | Freq: Every day | ORAL | 6 refills | Status: DC
Start: 2021-05-13 — End: 2021-09-02

## 2021-05-13 NOTE — Progress Notes (Signed)
IMPRESSION and PLAN:    #1. N/V with RUQ pain. Failed zofran. Better with phenergan. US showed gallstone 08/2019, neg HIDA for acute cholecystitis. Neg EGD. Still can have biliary colic, due to medications esp opoids, r/o gastroparesis or functional component.  #2. GERD with dysphagia (resolved after dil 06/2019) d/t distal eso stricture, 2 cm HH. Neg Bx for EoE. H/O food impaction 2003 s/p endoscopic disimpaction. Has assoc oropharyngeal dysphagia d/t ?Parkinson's.  Had PEJ 2012 (removed 2017).   #3. Colorectal cancer screening: Colonoscopy 06/2019: 6 mm TA s/p polypectomy, mild sig div, highly redundant colon. Rpt colon only if any problems.  #4. 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 Likely d/t NASH. No ETOH, neg Hep C. No varices on EGD 06/2019 or ascites on [place] 02/2018. CT AP July 2021 neg for any masses. No HE. Nl AFP  #6. Chronic constipation (likely OIC). Failed miralax, colace. Better with 3 dulcolax/day. Neg colon 06/2019. Occ rectal bleeding d/t hoids.  #7. Asymptomatic cholelithiasis. Neg HIDA 09/2019 for acute cholecystitis.   Plan: -Continue Protonix 40mg  po qd. -Phenergan 25mg  po QID prn #45. Sedation and fall precautions were discussed. -[place] abdo complete -If [place] neg, proceed with solid-phase GES. -CBC, CMP, PT INR, lipase, AFP -Continue dulcolax. -Minimize pain medications. -FU in 12 weeks in person.  HPI:    Chief Complaint:   Amy Moon is a 70 y.o. female  With CAD on Plavix, HTN, cellulitis along L knee replacement scar (recently treated with doxycycline)  OV changed to televisit d/t transportation problems.  Underwent L knee arthroplasty 01/21/2021. Hb 11 to 8.6 s/p 2U   Continues to have RUQ pain radiating to the back with N/V.  Only Phenergan helps.  She wanted me to refill Phenergan.  She has been compliant with Protonix.  She denies having any heartburn.   No odynophagia or dysphagia.  No jaundice dark urine or pale stools.  No abdominal distention.  Has chronic constipation exacerbated by pain medications.  Dulcolax does help.     From previous records: Underwent RUQ [place] which showed gallstone.  She had + [place] Murphy's sign.Thereafter, underwent HIDA scan which was neg for acute cholecystitis.  Most recently underwent CT Abdo/pelvis 12/25/2019 which was neg for any acute abn.  Wt Readings from Last 3 Encounters:  03/03/21 163 lb 12.8 oz (74.3 kg)  01/21/21 163 lb 12.8 oz (74.3 kg)  01/14/21 163 lb 12.8 oz (74.3 kg)     Underwent EGD and colonoscopy January 2021  Past GI procedures: EGD: 07/16/2019 distal esophageal stricture s/p dilatation, grade A esophagitis, small hiatal hernia.  Colonoscopy Jan 2021:  6 mm colonic polyp s/p polypectomy, mild sig div. Otherwise Nl to TI.  Bx- tubular adenoma.  Rpt not indicated d/t age.  CT AP with contrast 12/25/2019 1. No acute findings within the abdomen or pelvis. 2. Hepatic cirrhosis and findings of portal venous hypertension. No evidence of hepatic neoplasm.  No ascites.  Mild splenomegaly. 3. Cholelithiasis. No radiographic evidence of cholecystitis.  [place] 08/2019 1. There is a 1.4 cm stone in the gallbladder. A positive Murphy's sign is reported. However, there is no wall thickening or pericholecystic fluid. Recommend clinical correlation. If there is concern for cholecystitis, recommend HIDA scan.  HIDA: neg 2. Probable hepatic steatosis. 3. No other abnormalities.   Past Medical History:  Diagnosis Date   Angina pectoris ([place])    Chronic kidney  disease    Drug-induced low platelet count    Duodenal ulcer    Granulomatous disease ([place])    Hepatic disease    Hypertension    Low vitamin D level    Neuropathy    Osteopenia    Parkinson's disease ([facility])        Splenomegaly a    Current Outpatient Medications  Medication Sig Dispense Refill   acetaminophen (TYLENOL) 650 MG CR  tablet Take 650 mg by mouth every 6 (six) hours as needed for pain.     atorvastatin (LIPITOR) 10 MG tablet TAKE 1 TABLET (10 MG TOTAL) BY MOUTH DAILY. 90 tablet 1   bisacodyl (DULCOLAX) 5 MG EC tablet Take 15 mg by mouth at bedtime as needed for moderate constipation.     clopidogrel (PLAVIX) 75 MG tablet Take 1 tablet (75 mg total) by mouth daily. 90 tablet 3   cyclobenzaprine (FLEXERIL) 10 MG tablet Take 1 tablet (10 mg total) by mouth every 8 (eight) hours as needed for muscle spasms. 60 tablet 1   diphenhydrAMINE (BENADRYL) 25 MG tablet Take 25 mg by mouth every 6 (six) hours as needed for allergies.     furosemide (LASIX) 40 MG tablet Take 1 tablet (40 mg total) by mouth daily. 90 tablet 2   HYDROcodone-acetaminophen (NORCO/VICODIN) 5-325 MG tablet TAKE 1 TO 2 TABLETS BY MOUTH EVERY 6 HOURS AS NEEDED FOR MODERATE PAIN. 30 tablet 0   losartan (COZAAR) 50 MG tablet Take 1 tablet (50 mg total) by mouth daily. **PLEASE CONTACT OUR OFFICE TO SCHEDULE A FOLLOW UP FOR FUTURE MED REFILLS** 30 tablet 0   nitroGLYCERIN (NITROSTAT) 0.4 MG SL tablet Place 0.4 mg under the tongue every 5 (five) minutes as needed for chest pain.      pantoprazole (PROTONIX) 40 MG tablet TAKE 1 TABLET BY MOUTH DAILY. (Patient taking differently: Take 40 mg by mouth daily.) 30 tablet 6   potassium chloride (KLOR-CON) 10 MEQ tablet TAKE 1 TABLET (10 MEQ TOTAL) BY MOUTH DAILY. 90 tablet 1   promethazine (PHENERGAN) 25 MG tablet TAKE 1 TABLET BY MOUTH 4 TIMES DAILY AS NEEDED FOR NAUSEA OR VOMITING. (Patient not taking: Reported on 05/13/2021) 45 tablet 0   ranolazine (RANEXA) 1000 MG SR tablet TAKE 1 TABLET BY MOUTH 2 TIMES DAILY. (Patient taking differently: Take 1,000 mg by mouth 2 (two) times daily.) 60 tablet 11   No current facility-administered medications for this visit.    Past Surgical History:  Procedure Laterality Date   ABDOMINAL HYSTERECTOMY     APPENDECTOMY     BACK SURGERY     thoracic-fractured    BACK  SURGERY     lumbar - fractured   BREAST LUMPECTOMY WITH RADIOACTIVE SEED LOCALIZATION Right 08/01/2018   Procedure: RADIOACTIVE SEED GUIDED RIGHT BREAST LUMPECTOMY;  Surgeon: Coralie Keens, MD;  Location: [facility];  Service: General;  Laterality: Right;   COLONOSCOPY     ESOPHAGOGASTRODUODENOSCOPY  05/20/2008   Mild to moderate esophagitis. Otherwise normal EGD.    EYE SURGERY Left    cataract surgery with lens implant   GASTROSTOMY     peptic ulcer - peg tube placement   TOTAL KNEE ARTHROPLASTY Left 01/21/2021   Procedure: LEFT TOTAL KNEE ARTHROPLASTY;  Surgeon: Mcarthur Rossetti, MD;  Location: [facility];  Service: Orthopedics;  Laterality: Left;  Need RNFA    Family History  Problem Relation Age of Onset   Cancer Mother        ovarian  Hyperlipidemia Mother    Alzheimer's disease Mother    Arthritis Mother        rheumatoid   Cancer Father        lung   Parkinson's disease Father    Alzheimer's disease Father    ALS Sister    Cancer Maternal Aunt        pancreatic, lung, liver,breast   Breast cancer Maternal Aunt    Cancer Maternal Uncle        pancreatic, liver, brain   Colon cancer Maternal Uncle    Heart attack Paternal Aunt    Stroke Paternal Aunt    Stomach cancer Paternal Aunt    Heart attack Paternal Uncle    Stroke Paternal Grandmother    Heart attack Paternal Grandfather    Arthritis Sister        rheumatoid   Huntington's disease Daughter        presumed inherited from father per patient   Colon cancer Maternal Uncle    Pancreatic cancer Maternal Uncle    Liver disease Maternal Uncle    Breast cancer Maternal Aunt    Breast cancer Maternal Aunt    Esophageal cancer Neg Hx    Rectal cancer Neg Hx     Social History   Tobacco Use   Smoking status: Former    Packs/day: 1.00    Years: 22.00    Pack years: 22.00    Types: Cigarettes    Quit date: 06/26/1986    Years since quitting: 34.9   Smokeless tobacco: Never  Vaping Use   Vaping Use:  Never used  Substance Use Topics   Alcohol use: No   Drug use: No    Allergies  Allergen Reactions   Iodinated Diagnostic Agents Hives   Nsaids Other (See Comments)    History of bleeding ulcers   Latex Hives and Rash   Sinemet [Carbidopa W-Levodopa] Nausea And Vomiting   Inderal [Propranolol] Other (See Comments)    Unknown   Indomethacin Other (See Comments)    Unknown   Penicillin G Other (See Comments)    Unknown   Pneumococcal Vaccines Other (See Comments)    Caused "pneumonia"   Scopolamine Other (See Comments)    The patch, caused her to pass out / change in mental status   Tape Other (See Comments)    Unknown     Review of Systems: All systems reviewed and negative except where noted in HPI.    Physical Exam:   Not examined as it was televisit   I connected with  Amy Moon on 05/13/21 by a video enabled telemedicine application and verified that I am speaking with the correct person using two identifiers.   I discussed the limitations of evaluation and management by telemedicine. The patient expressed understanding and agreed to proceed.  Pt at home Provider - at clinic at [facility].  30 min including review    Cynthya Yam,MD 05/13/2021, 2:13 PM   CC Lorrene Reid, PA-C

## 2021-05-13 NOTE — Telephone Encounter (Signed)
Pt stated that she has a fever and requesting to have a virtual appointment today. Pt was scheduled 05/13/2021 @ 2:10.  Pt appointment has been changed to virtual at same time. Pt made aware. Pt was instructed to check her MY CHART for detail instructions for appointment.  Pt verbalized understanding with all questions answered.

## 2021-05-13 NOTE — Patient Instructions (Signed)
If you are age 72 or older, your body mass index should be between 23-30. Your Body mass index is 31.99 kg/m. If this is out of the aforementioned range listed, please consider follow up with your Primary Care Provider.  If you are age 81 or younger, your body mass index should be between 19-25. Your Body mass index is 31.99 kg/m. If this is out of the aformentioned range listed, please consider follow up with your Primary Care Provider.   ________________________________________________________  The [place] [facility] providers would like to encourage you to use [facility] to communicate with providers for non-urgent requests or questions.  Due to long hold times on the telephone, sending your provider a message by [facility] may be a faster and more efficient way to get a response.  Please allow 48 business hours for a response.  Please remember that this is for non-urgent requests.  _______________________________________________________  We have sent the following medications to your pharmacy for you to pick up at your convenience: Phenergan 25mg   Continue dulcolax  Please follow up in 3 months by making an office appointment.  Please go to the lab on the 2nd floor [address] of the [facility]. They are open 8am-to 4pm Monday through Friday. Close from 12pm to 1pm.   You have been scheduled for an abdominal ultrasound at [place] on 05-18-2021 at 11am. Please arrive 15 minutes prior to your appointment for registration. Make certain not to have anything to eat or drink 6 hours prior to your appointment. Should you need to reschedule your appointment, please contact radiology at [phone]. This test typically takes about 30 minutes to perform.

## 2021-05-13 NOTE — Telephone Encounter (Signed)
Inbound call from patient; has an appt today but states she is running a fever and is wanting to know if cannot come in what she needs to do in regards to her symptoms.  Please advise.

## 2021-05-17 ENCOUNTER — Telehealth: Payer: Self-pay | Admitting: *Deleted

## 2021-05-17 ENCOUNTER — Other Ambulatory Visit: Payer: Self-pay | Admitting: Orthopaedic Surgery

## 2021-05-17 MED ORDER — HYDROCODONE-ACETAMINOPHEN 5-325 MG PO TABS: 1.0000 | ORAL_TABLET | Freq: Four times a day (QID) | ORAL | 0 refills | Status: DC | PRN

## 2021-05-17 NOTE — Telephone Encounter (Signed)
Patient called requesting refill of pain medication before the holiday. She got refill last week as well. Thanks.

## 2021-05-18 ENCOUNTER — Other Ambulatory Visit: Payer: Self-pay

## 2021-05-18 ENCOUNTER — Other Ambulatory Visit (INDEPENDENT_AMBULATORY_CARE_PROVIDER_SITE_OTHER): Payer: Medicare Other

## 2021-05-18 ENCOUNTER — Ambulatory Visit (HOSPITAL_BASED_OUTPATIENT_CLINIC_OR_DEPARTMENT_OTHER)
Admission: RE | Admit: 2021-05-18 | Discharge: 2021-05-18 | Disposition: A | Discharge status: Home / Self Care | Payer: Medicare Other | Source: Ambulatory Visit | Attending: Gastroenterology | Admitting: Gastroenterology

## 2021-05-18 DIAGNOSIS — R1011 Right upper quadrant pain: Secondary | ICD-10-CM

## 2021-05-18 DIAGNOSIS — K5909 Other constipation: Secondary | ICD-10-CM | POA: Diagnosis not present

## 2021-05-18 DIAGNOSIS — K8020 Calculus of gallbladder without cholecystitis without obstruction: Secondary | ICD-10-CM

## 2021-05-18 DIAGNOSIS — K219 Gastro-esophageal reflux disease without esophagitis: Secondary | ICD-10-CM

## 2021-05-18 DIAGNOSIS — R110 Nausea: Secondary | ICD-10-CM | POA: Diagnosis not present

## 2021-05-18 DIAGNOSIS — K760 Fatty (change of) liver, not elsewhere classified: Secondary | ICD-10-CM | POA: Diagnosis not present

## 2021-05-19 LAB — COMPREHENSIVE METABOLIC PANEL
ALT: 8 IU/L (ref 0–32)
AST: 17 IU/L (ref 0–40)
Albumin/Globulin Ratio: 1.8 (ref 1.2–2.2)
Albumin: 4.3 g/dL (ref 3.7–4.7)
Alkaline Phosphatase: 101 IU/L (ref 44–121)
BUN/Creatinine Ratio: 11 — ABNORMAL LOW (ref 12–28)
BUN: 7 mg/dL — ABNORMAL LOW (ref 8–27)
Bilirubin Total: 1.1 mg/dL (ref 0.0–1.2)
CO2: 27 mmol/L (ref 20–29)
Calcium: 9.4 mg/dL (ref 8.7–10.3)
Chloride: 102 mmol/L (ref 96–106)
Creatinine, Ser: 0.64 mg/dL (ref 0.57–1.00)
Globulin, Total: 2.4 g/dL (ref 1.5–4.5)
Glucose: 93 mg/dL (ref 70–99)
Potassium: 4 mmol/L (ref 3.5–5.2)
Sodium: 143 mmol/L (ref 134–144)
Total Protein: 6.7 g/dL (ref 6.0–8.5)
eGFR: 94 mL/min/{1.73_m2} (ref >59)

## 2021-05-19 LAB — CBC WITH DIFFERENTIAL/PLATELET
Basophils Absolute: 0 10*3/uL (ref 0.0–0.2)
Basos: 1 %
EOS (ABSOLUTE): 0.1 10*3/uL (ref 0.0–0.4)
Eos: 3 %
Hematocrit: 36.3 % (ref 34.0–46.6)
Hemoglobin: 12.3 g/dL (ref 11.1–15.9)
Immature Grans (Abs): 0 10*3/uL (ref 0.0–0.1)
Immature Granulocytes: 0 %
Lymphocytes Absolute: 1.7 10*3/uL (ref 0.7–3.1)
Lymphs: 39 %
MCH: 31.4 pg (ref 26.6–33.0)
MCHC: 33.9 g/dL (ref 31.5–35.7)
MCV: 93 fL (ref 79–97)
Monocytes Absolute: 0.2 10*3/uL (ref 0.1–0.9)
Monocytes: 6 %
Neutrophils Absolute: 2.2 10*3/uL (ref 1.4–7.0)
Neutrophils: 51 %
Platelets: 149 10*3/uL — ABNORMAL LOW (ref 150–450)
RBC: 3.92 x10E6/uL (ref 3.77–5.28)
RDW: 12.7 % (ref 11.7–15.4)
WBC: 4.3 10*3/uL (ref 3.4–10.8)

## 2021-05-19 LAB — LIPASE: Lipase: 8 U/L — ABNORMAL LOW (ref 14–85)

## 2021-05-19 LAB — PROTIME-INR
INR: 1 (ref 0.9–1.2)
Prothrombin Time: 10.9 s (ref 9.1–12.0)

## 2021-05-19 LAB — AFP TUMOR MARKER: AFP-Tumor Marker: 4.2 ng/mL

## 2021-05-23 ENCOUNTER — Other Ambulatory Visit: Payer: Self-pay | Admitting: Orthopaedic Surgery

## 2021-06-01 ENCOUNTER — Other Ambulatory Visit: Payer: Self-pay | Admitting: Orthopaedic Surgery

## 2021-06-02 ENCOUNTER — Ambulatory Visit (INDEPENDENT_AMBULATORY_CARE_PROVIDER_SITE_OTHER): Payer: Medicare Other | Admitting: Physician Assistant

## 2021-06-02 ENCOUNTER — Other Ambulatory Visit: Payer: Self-pay

## 2021-06-02 ENCOUNTER — Encounter: Payer: Self-pay | Admitting: Physician Assistant

## 2021-06-02 VITALS — BP 110/72 | HR 103 | Temp 97.9°F | Ht 60.0 in | Wt 151.5 lb

## 2021-06-02 DIAGNOSIS — R000 Tachycardia, unspecified: Secondary | ICD-10-CM

## 2021-06-02 DIAGNOSIS — I10 Essential (primary) hypertension: Secondary | ICD-10-CM | POA: Diagnosis not present

## 2021-06-02 DIAGNOSIS — R509 Fever, unspecified: Secondary | ICD-10-CM

## 2021-06-02 DIAGNOSIS — G4700 Insomnia, unspecified: Secondary | ICD-10-CM

## 2021-06-02 DIAGNOSIS — R21 Rash and other nonspecific skin eruption: Secondary | ICD-10-CM | POA: Diagnosis not present

## 2021-06-02 MED ORDER — LOSARTAN POTASSIUM 50 MG PO TABS: 50.0000 mg | ORAL_TABLET | Freq: Every day | ORAL | 0 refills | Status: DC

## 2021-06-02 MED ORDER — HYDROXYZINE PAMOATE 25 MG PO CAPS
25.0000 mg | ORAL_CAPSULE | Freq: Every evening | ORAL | 0 refills | Status: DC | PRN
Start: 2021-06-02 — End: 2021-09-02

## 2021-06-02 MED ORDER — METHYLPREDNISOLONE SODIUM SUCC 40 MG IJ SOLR
80.0000 mg | Freq: Once | INTRAMUSCULAR | Status: AC
  Administered 2021-06-02: 80 mg via INTRAMUSCULAR

## 2021-06-02 NOTE — Progress Notes (Signed)
Established Patient Office Visit  Subjective:  Patient ID: Amy Moon, female    DOB: 1950/08/18  Age: 70 y.o. MRN: [id number]  CC:  Chief Complaint  Patient presents with   Rash    HPI Amy Moon presents for follow up on hypertension. Patient has c/o intermittent scattered rash which started in July 2022. Rash is itchy and also has a stinging sensation. Does report fever in the evenings (102.1 has been max temp) and chills. Denies night sweats, tick bites, new soaps or detergents. Patient states continues to have trouble with falling asleep because of her "nerves." If she could just get something for her nerves then she would likely be able to sleep. States has lots of personal stress. Patient followed by cardiology and states continues to have intermittent chest pain, denies worsening symptoms. No chest pain currently.    Past Medical History:  Diagnosis Date   Allergy    Anemia    Angina pectoris ([place])    Arthritis    Asthma    years ago   Bronchitis 06/03/2018   Cancer ([place])    squamous cell carcinoma, nose   Cataract    Chest pain 07/30/2017   Chronic kidney disease    possibly per pt   Chronic pain syndrome 07/30/2017   Cirrhosis ([place])    Clotting disorder (HCC)    platelets are low   Coronary artery disease 04/02/2020   Depression    Drug-induced low platelet count    Duodenal ulcer    Dyslipidemia 04/02/2020   Dysphagia 09/03/2017   Dyspnea on exertion 10/17/2019   Encounter for hepatitis C virus screening test for high risk patient 11/05/2017   Encounter for Medicare annual wellness exam 05/09/2018   Enlarged thyroid    Family history of adverse reaction to anesthesia    father had difficulty waking up and breathing on his own after surgery   GERD (gastroesophageal reflux disease)    Granulomatous disease ([place])    Healthcare maintenance 07/30/2017   Hepatic disease    Hiatal hernia    History of exposure to asbestos 10/21/2019   History of  kidney stones    History of smoking 25-50 pack years- quit age 71, smoked 2ppd from 70 yo-70 yo. 10/21/2019   History of surgery- pt has several Sx- no personal issues wiht anesthesia  10/21/2019   Hypertension    Irritant contact dermatitis 08/25/2015   Low vitamin D level    Migraine    Nausea 03/01/2016   Neuropathy    Osteopenia    Osteoporosis    Parkinson's disease ([place])    Peripheral vascular disease ([place])    Pneumonia    PONV (postoperative nausea and vomiting)    Portal hypertension (HCC)    Severe episode of recurrent major depressive disorder, without psychotic features ([place]) 05/15/2016   Severe obesity (BMI >= 40) ([place]) 05/15/2016   Sleep disturbance 10/21/2019   Splenomegaly a   Stage 3 chronic kidney disease ([place]) 10/21/2019   Thrombocytopenia ([place]) 09/03/2017   Tremor of both hands 05/15/2016   Vitamin D deficiency 11/05/2017    Past Surgical History:  Procedure Laterality Date   ABDOMINAL HYSTERECTOMY     APPENDECTOMY     BACK SURGERY     thoracic-fractured    BACK SURGERY     lumbar - fractured   BREAST LUMPECTOMY WITH RADIOACTIVE SEED LOCALIZATION Right 08/01/2018   Procedure: RADIOACTIVE SEED GUIDED RIGHT BREAST LUMPECTOMY;  Surgeon: Coralie Keens, MD;  Location:  [facility];  Service: General;  Laterality: Right;   COLONOSCOPY     ESOPHAGOGASTRODUODENOSCOPY  05/20/2008   Mild to moderate esophagitis. Otherwise normal EGD.    EYE SURGERY Left    cataract surgery with lens implant   GASTROSTOMY     peptic ulcer - peg tube placement   TOTAL KNEE ARTHROPLASTY Left 01/21/2021   Procedure: LEFT TOTAL KNEE ARTHROPLASTY;  Surgeon: Mcarthur Rossetti, MD;  Location: [facility];  Service: Orthopedics;  Laterality: Left;  Need RNFA    Family History  Problem Relation Age of Onset   Cancer Mother        ovarian   Hyperlipidemia Mother    Alzheimer's disease Mother    Arthritis Mother        rheumatoid   Cancer Father        lung   Parkinson's disease  Father    Alzheimer's disease Father    ALS Sister    Cancer Maternal Aunt        pancreatic, lung, liver,breast   Breast cancer Maternal Aunt    Cancer Maternal Uncle        pancreatic, liver, brain   Colon cancer Maternal Uncle    Heart attack Paternal Aunt    Stroke Paternal Aunt    Stomach cancer Paternal Aunt    Heart attack Paternal Uncle    Stroke Paternal Grandmother    Heart attack Paternal Grandfather    Arthritis Sister        rheumatoid   Huntington's disease Daughter        presumed inherited from father per patient   Colon cancer Maternal Uncle    Pancreatic cancer Maternal Uncle    Liver disease Maternal Uncle    Breast cancer Maternal Aunt    Breast cancer Maternal Aunt    Esophageal cancer Neg Hx    Rectal cancer Neg Hx     Social History   Socioeconomic History   Marital status: Married    Spouse name: Not on file   Number of children: 3   Years of education: Not on file   Highest education level: Not on file  Occupational History   Not on file  Tobacco Use   Smoking status: Former    Packs/day: 1.00    Years: 22.00    Pack years: 22.00    Types: Cigarettes    Quit date: 06/26/1986    Years since quitting: 34.9   Smokeless tobacco: Never  Vaping Use   Vaping Use: Never used  Substance and Sexual Activity   Alcohol use: No   Drug use: No   Sexual activity: Not Currently    Birth control/protection: None, Surgical  Other Topics Concern   Not on file  Social History Narrative   Not on file   Social Determinants of Health   Financial Resource Strain: Not on file  Food Insecurity: Not on file  Transportation Needs: Not on file  Physical Activity: Not on file  Stress: Not on file  Social Connections: Not on file  Intimate Partner Violence: Not on file    Outpatient Medications Prior to Visit  Medication Sig Dispense Refill   acetaminophen (TYLENOL) 650 MG CR tablet Take 650 mg by mouth every 6 (six) hours as needed for pain.      atorvastatin (LIPITOR) 10 MG tablet TAKE 1 TABLET (10 MG TOTAL) BY MOUTH DAILY. 90 tablet 1   bisacodyl (DULCOLAX) 5 MG EC tablet Take 15 mg by mouth at  bedtime as needed for moderate constipation.     clopidogrel (PLAVIX) 75 MG tablet Take 1 tablet (75 mg total) by mouth daily. 90 tablet 3   cyclobenzaprine (FLEXERIL) 10 MG tablet TAKE 1 TABLET BY MOUTH EVERY 8 HOURS AS NEEDED FOR MUSCLE SPASMS. 60 tablet 1   diphenhydrAMINE (BENADRYL) 25 MG tablet Take 25 mg by mouth every 6 (six) hours as needed for allergies.     furosemide (LASIX) 40 MG tablet Take 1 tablet (40 mg total) by mouth daily. 90 tablet 2   HYDROcodone-acetaminophen (NORCO/VICODIN) 5-325 MG tablet TAKE 1 TABLET BY MOUTH EVERY 6 HOURS AS NEEDED FOR MODERATE PAIN. 30 tablet 0   nitroGLYCERIN (NITROSTAT) 0.4 MG SL tablet Place 0.4 mg under the tongue every 5 (five) minutes as needed for chest pain.      pantoprazole (PROTONIX) 40 MG tablet Take 1 tablet (40 mg total) by mouth daily. 30 tablet 6   potassium chloride (KLOR-CON) 10 MEQ tablet TAKE 1 TABLET (10 MEQ TOTAL) BY MOUTH DAILY. 90 tablet 1   promethazine (PHENERGAN) 25 MG tablet Take 1 tablet (25 mg total) by mouth every 6 (six) hours as needed for nausea or vomiting. 45 tablet 0   ranolazine (RANEXA) 1000 MG SR tablet TAKE 1 TABLET BY MOUTH 2 TIMES DAILY. (Patient taking differently: Take 1,000 mg by mouth 2 (two) times daily.) 60 tablet 11   losartan (COZAAR) 50 MG tablet Take 1 tablet (50 mg total) by mouth daily. **PLEASE CONTACT OUR OFFICE TO SCHEDULE A FOLLOW UP FOR FUTURE MED REFILLS** 30 tablet 0   No facility-administered medications prior to visit.    Allergies  Allergen Reactions   Iodinated Diagnostic Agents Hives   Nsaids Other (See Comments)    History of bleeding ulcers   Latex Hives and Rash   Sinemet [Carbidopa W-Levodopa] Nausea And Vomiting   Inderal [Propranolol] Other (See Comments)    Unknown   Indomethacin Other (See Comments)    Unknown    Penicillin G Other (See Comments)    Unknown   Pneumococcal Vaccines Other (See Comments)    Caused "pneumonia"   Scopolamine Other (See Comments)    The patch, caused her to pass out / change in mental status   Tape Other (See Comments)    Unknown    ROS Review of Systems Review of Systems:  A fourteen system review of systems was performed and found to be positive as per HPI.   Objective:    Physical Exam General:  Cooperative, answers questions appropriately, in no acute distress  Neuro:  Alert and oriented,  extra-ocular muscles intact  HEENT:  Normocephalic, atraumatic, neck supple Skin: scattered coalesced erythematous irregular lesions  Cardiac:  Regular rhythm, tachycardia  Respiratory: CTA B/L, Not using accessory muscles, speaking in full sentences- unlabored. Vascular:  Ext warm, no cyanosis apprec.; cap RF less 2 sec. Psych:  No HI/SI, judgement and insight good, Euthymic mood. Full Affect.  BP 110/72   Pulse (!) 103   Temp 97.9 F (36.6 C)   Ht 5' (1.524 m)   Wt 151 lb 8 oz (68.7 kg)   SpO2 99%   BMI 29.59 kg/m  Wt Readings from Last 3 Encounters:  06/02/21 151 lb 8 oz (68.7 kg)  03/03/21 163 lb 12.8 oz (74.3 kg)  01/21/21 163 lb 12.8 oz (74.3 kg)     Health Maintenance Due  Topic Date Due   COVID-19 Vaccine (1) Never done   Pneumonia Vaccine 29+ Years old (  1 - PCV) Never done   TETANUS/TDAP  Never done   Zoster Vaccines- Shingrix (1 of 2) Never done   MAMMOGRAM  06/25/2020   INFLUENZA VACCINE  Never done    There are no preventive care reminders to display for this patient.  Lab Results  Component Value Date   TSH 0.254 (L) 06/18/2019   Lab Results  Component Value Date   WBC 4.3 05/18/2021   HGB 12.3 05/18/2021   HCT 36.3 05/18/2021   MCV 93 05/18/2021   PLT 149 (L) 05/18/2021   Lab Results  Component Value Date   NA 143 05/18/2021   K 4.0 05/18/2021   CO2 27 05/18/2021   GLUCOSE 93 05/18/2021   BUN 7 (L) 05/18/2021   CREATININE  0.64 05/18/2021   BILITOT 1.1 05/18/2021   ALKPHOS 101 05/18/2021   AST 17 05/18/2021   ALT 8 05/18/2021   PROT 6.7 05/18/2021   ALBUMIN 4.3 05/18/2021   CALCIUM 9.4 05/18/2021   ANIONGAP 6 01/25/2021   EGFR 94 05/18/2021   GFR 86.72 09/17/2019   Lab Results  Component Value Date   CHOL 170 04/02/2020   Lab Results  Component Value Date   HDL 57 04/02/2020   Lab Results  Component Value Date   LDLCALC 99 04/02/2020   Lab Results  Component Value Date   TRIG 71 04/02/2020   Lab Results  Component Value Date   CHOLHDL 3.0 04/02/2020   Lab Results  Component Value Date   HGBA1C 4.8 06/18/2019   Depression screen [facility] 2/9 06/02/2021 01/12/2021 10/05/2020 07/08/2020 05/18/2020  Decreased Interest 0 0 0 0 2  Down, Depressed, Hopeless 0 1 1 0 3  PHQ - 2 Score 0 1 1 0 5  Altered sleeping _0 0 3  Tired, decreased energy _1 0 3  Change in appetite 1 0 1 0 0  Feeling bad or failure about yourself  1 0 1 0 1  Trouble concentrating 1 0 1 0 1  Moving slowly or fidgety/restless 1 1 0 0 0  Suicidal thoughts 0 0 0 0 0  PHQ-9 Score _2 0 13  Difficult doing work/chores Somewhat difficult - - - Very difficult  Some recent data might be hidden   GAD 7 : Generalized Anxiety Score 06/02/2021 01/12/2021 12/22/2019  Nervous, Anxious, on Edge _3 Control/stop worrying _4 Worry too much - different things _5 Trouble relaxing _6 Restless _7 Easily annoyed or irritable 1 1 0  Afraid - awful might happen _8 Total GAD 7 Score _9 Anxiety Difficulty Somewhat difficult - Somewhat difficult       Assessment & Plan:   Problem List Items Addressed This Visit       Cardiovascular and Mediastinum   Hypertension - Primary   Relevant Medications   losartan (COZAAR) 50 MG tablet   Other Visit Diagnoses     Rash and nonspecific skin eruption       Relevant Medications   hydrOXYzine (VISTARIL) 25 MG capsule   methylPREDNISolone sodium succinate  (SOLU-MEDROL) 40 mg/mL injection 80 mg (Completed)   Other Relevant Orders   ANA w/Reflex if Positive   Rheumatoid Factor   Sedimentation rate   C-reactive protein   CBC w/Diff   Ambulatory referral to Dermatology   Fever, unspecified fever cause       Relevant  Orders   ANA w/Reflex if Positive   Rheumatoid Factor   Sedimentation rate   C-reactive protein   CBC w/Diff   Tachycardia       Relevant Orders   EKG 12-Lead   Insomnia, unspecified type          Tachycardia: -On intake pulse mildly elevated at 103, BP repeated and elevated at 110. EKG obtained: sinus tachycardia, rate 110 bpm, no acute ST-T wave changes. Compared to EKG 10/04/2020 NSR with rate 82, 07/05/2020 NSR with rate 94. Advised to follow up with cardiology, currently asymptomatic. Discussed red flag s/sx and recommend to seek immediate medical care.  Hypertension: -BP stable. -Continue current medication regimen. Recent CMP, renal function and electrolytes normal. -Will continue to monitor.  Rash and nonspecific skin eruption: -Etiology unclear especially with systemic symptoms. Will place lab orders to evaluate for potential etiologies such as autoimmune, infection or signs of malignancy. Possibly stress-related. Will administer corticosteroid injection and refer to dermatology for further evaluation.   Insomnia: -Patient has tried and failed several medications (Trazodone, Lunesta, Mirtazapine, Seroquel). Discussed referral to sleep clinic/neurology, patient deferred. Started patient on hydroxyzine for itchy rash and advised can also provide benefits for anxiety and insomnia. Advised to not take benadryl and hydroxyzine together, patient verbalized understanding.   Meds ordered this encounter  Medications   losartan (COZAAR) 50 MG tablet    Sig: Take 1 tablet (50 mg total) by mouth daily.    Dispense:  90 tablet    Refill:  0    Order Specific Question:   Supervising Provider    Answer:   Beatrice Lecher D [[id number]]   hydrOXYzine (VISTARIL) 25 MG capsule    Sig: Take 1 capsule (25 mg total) by mouth at bedtime as needed.    Dispense:  30 capsule    Refill:  0    Order Specific Question:   Supervising Provider    Answer:   Beatrice Lecher D [[id number]]   methylPREDNISolone sodium succinate (SOLU-MEDROL) 40 mg/mL injection 80 mg    Follow-up: Return in about 3 months (around 08/31/2021) for [place].    Lorrene Reid, PA-C

## 2021-06-02 NOTE — Patient Instructions (Signed)

## 2021-06-03 LAB — CBC WITH DIFFERENTIAL/PLATELET
Basophils Absolute: 0 10*3/uL (ref 0.0–0.2)
Basos: 1 %
EOS (ABSOLUTE): 0.1 10*3/uL (ref 0.0–0.4)
Eos: 1 %
Hematocrit: 36.7 % (ref 34.0–46.6)
Hemoglobin: 12.5 g/dL (ref 11.1–15.9)
Immature Grans (Abs): 0 10*3/uL (ref 0.0–0.1)
Immature Granulocytes: 0 %
Lymphocytes Absolute: 1.2 10*3/uL (ref 0.7–3.1)
Lymphs: 23 %
MCH: 31.7 pg (ref 26.6–33.0)
MCHC: 34.1 g/dL (ref 31.5–35.7)
MCV: 93 fL (ref 79–97)
Monocytes Absolute: 0.3 10*3/uL (ref 0.1–0.9)
Monocytes: 6 %
Neutrophils Absolute: 3.5 10*3/uL (ref 1.4–7.0)
Neutrophils: 69 %
Platelets: 161 10*3/uL (ref 150–450)
RBC: 3.94 x10E6/uL (ref 3.77–5.28)
RDW: 13 % (ref 11.7–15.4)
WBC: 5.1 10*3/uL (ref 3.4–10.8)

## 2021-06-03 LAB — C-REACTIVE PROTEIN: CRP: 3 mg/L (ref 0–10)

## 2021-06-03 LAB — SEDIMENTATION RATE: Sed Rate: 8 mm/hr (ref 0–40)

## 2021-06-03 LAB — ANA W/REFLEX IF POSITIVE: Anti Nuclear Antibody (ANA): NEGATIVE

## 2021-06-03 LAB — RHEUMATOID FACTOR: Rheumatoid fact SerPl-aCnc: <10 IU/mL (ref <14.0)

## 2021-06-06 ENCOUNTER — Encounter: Payer: Self-pay | Admitting: Physician Assistant

## 2021-06-06 LAB — EKG 12-LEAD

## 2021-06-08 ENCOUNTER — Telehealth: Payer: Self-pay | Admitting: *Deleted

## 2021-06-08 ENCOUNTER — Other Ambulatory Visit: Payer: Self-pay | Admitting: Orthopaedic Surgery

## 2021-06-08 NOTE — Telephone Encounter (Signed)
Disregard previous message. Sherrie was able to schedule her for tomorrow. Thanks.

## 2021-06-08 NOTE — Telephone Encounter (Signed)
Patient called stating she had a really bad fall more than a week ago and is having trouble bearing weight on her post op knee. Swells occasionally. L-TKA on 01/21/21. What do you recommend as far as scheduling?

## 2021-06-09 ENCOUNTER — Ambulatory Visit: Payer: Medicare Other | Admitting: Orthopaedic Surgery

## 2021-06-13 ENCOUNTER — Ambulatory Visit: Payer: Self-pay

## 2021-06-13 ENCOUNTER — Encounter: Payer: Self-pay | Admitting: Physician Assistant

## 2021-06-13 ENCOUNTER — Ambulatory Visit: Payer: Medicare Other | Admitting: Physician Assistant

## 2021-06-13 DIAGNOSIS — Z96652 Presence of left artificial knee joint: Secondary | ICD-10-CM | POA: Diagnosis not present

## 2021-06-13 MED ORDER — CYCLOBENZAPRINE HCL 10 MG PO TABS: 10.0000 mg | ORAL_TABLET | Freq: Three times a day (TID) | ORAL | 1 refills | Status: DC | PRN

## 2021-06-13 MED ORDER — HYDROCODONE-ACETAMINOPHEN 5-325 MG PO TABS: 1.0000 | ORAL_TABLET | Freq: Four times a day (QID) | ORAL | 0 refills | Status: DC | PRN

## 2021-06-13 NOTE — Progress Notes (Signed)
Office Visit Note   Patient: Amy Moon           Date of Birth: 1950-12-03           MRN: [id number] Visit Date: 06/13/2021              Requested by: Lorrene Reid, PA-C [address],  [place] [address] PCP: Lorrene Reid, PA-C   Assessment & Plan: Visit Diagnoses:  1. Status post total left knee replacement     Plan: Discussed with patient this mostly represents a contusion.  Reviewed radiographs with her that showed no acute findings.  She is unable to take NSAIDs due to the fact she is on Plavix.  Therefore we will refill her hydrocodone and her Flexeril.  We will see her back in 2 weeks see how she is doing overall.  She will work on quad strengthening in the interim  Follow-Up Instructions: Return in about 2 weeks (around 06/27/2021).   Orders:  Orders Placed This Encounter  Procedures   XR Knee 1-2 Views Left   No orders of the defined types were placed in this encounter.     Procedures: No procedures performed   Clinical Data: No additional findings.   Subjective: Chief Complaint  Patient presents with   Left Knee - Routine Post Op    HPI Amy Moon comes in today for left knee pain.  History of left total knee arthroplasty 01/21/2021.  She was doing well until about a week and a half ago when she fell onto her knee.  Had increased pain in the knee since then.  No loss consciousness or dizziness at the time of follow strictly mechanical.  She been taking hydrocodone for pain.  No other injuries.   Review of Systems   Objective: Vital Signs: There were no vitals taken for this visit.  Physical Exam General: Well-developed well-nourished female no acute distress mood and affect appropriate Ortho Exam Left knee global tenderness over the anterior medial aspect of the knee.  No ecchymosis erythema edema or effusion.  Good range of motion of the left knee actively and passively.  No instability valgus varus stressing left  knee. Specialty Comments:  No specialty comments available.  Imaging: XR Knee 1-2 Views Left  Result Date: 06/13/2021 Left knee 2 views: Knee is well located.  Total knee components all appear to be well-seated.  No hardware failure.  No other bony abnormalities.  No acute fractures.    PMFS History: Patient Active Problem List   Diagnosis Date Noted   Cellulitis of left lower extremity 03/15/2021   Atopic dermatitis 03/15/2021   Encounter for screening for COVID-19 03/15/2021   Status post left knee replacement 01/22/2021   Status post total left knee replacement 01/21/2021   Unilateral primary osteoarthritis, left knee 11/30/2020   Dyslipidemia 04/02/2020   Splenomegaly    Portal hypertension (HCC)    Pneumonia    Osteoporosis    Osteopenia    Neuropathy    Low vitamin D level    History of kidney stones    Hiatal hernia    Hepatic disease    Granulomatous disease (HCC)    GERD (gastroesophageal reflux disease)    Enlarged thyroid    Duodenal ulcer    Drug-induced low platelet count    Depression    Clotting disorder ([place])    Cirrhosis ([place])    Chronic kidney disease    Cataract    Cancer ([place])  Asthma    Arthritis    Angina pectoris ([place])    Anemia    Allergy    Stage 3 chronic kidney disease ([place]) 10/21/2019   History of smoking 25-50 pack years- quit age 59, smoked 2ppd from 70 yo-70 yo. 10/21/2019   History of surgery- pt has several Sx- no personal issues wiht anesthesia  10/21/2019   Sleep disturbance 10/21/2019   History of exposure to asbestos 10/21/2019   Family history of adverse reaction to anesthesia    Dyspnea on exertion 10/17/2019   Encounter for Medicare annual wellness exam 05/09/2018   Vitamin D deficiency 11/05/2017   Encounter for hepatitis C virus screening test for high risk patient 11/05/2017   Dysphagia 09/03/2017   Thrombocytopenia ([place]) 09/03/2017   Healthcare maintenance 07/30/2017   Chronic pain syndrome 07/30/2017   Chest  pain 07/30/2017   Parkinson's disease ([place]) 07/30/2017   Hypertension 07/30/2017   Severe episode of recurrent major depressive disorder, without psychotic features ([place]) 05/15/2016   Tremor of both hands 05/15/2016   Severe obesity (BMI >= 40) ([place]) 05/15/2016   Nausea 03/01/2016   Irritant contact dermatitis 08/25/2015   Past Medical History:  Diagnosis Date   Allergy    Anemia    Angina pectoris ([place])    Arthritis    Asthma    years ago   Bronchitis 06/03/2018   Cancer ([place])    squamous cell carcinoma, nose   Cataract    Chest pain 07/30/2017   Chronic kidney disease    possibly per pt   Chronic pain syndrome 07/30/2017   Cirrhosis (HCC)    Clotting disorder (HCC)    platelets are low   Coronary artery disease 04/02/2020   Depression    Drug-induced low platelet count    Duodenal ulcer    Dyslipidemia 04/02/2020   Dysphagia 09/03/2017   Dyspnea on exertion 10/17/2019   Encounter for hepatitis C virus screening test for high risk patient 11/05/2017   Encounter for Medicare annual wellness exam 05/09/2018   Enlarged thyroid    Family history of adverse reaction to anesthesia    father had difficulty waking up and breathing on his own after surgery   GERD (gastroesophageal reflux disease)    Granulomatous disease ([place])    Healthcare maintenance 07/30/2017   Hepatic disease    Hiatal hernia    History of exposure to asbestos 10/21/2019   History of kidney stones    History of smoking 25-50 pack years- quit age 82, smoked 2ppd from 70 yo-70 yo. 10/21/2019   History of surgery- pt has several Sx- no personal issues wiht anesthesia  10/21/2019   Hypertension    Irritant contact dermatitis 08/25/2015   Low vitamin D level    Migraine    Nausea 03/01/2016   Neuropathy    Osteopenia    Osteoporosis    Parkinson's disease ([place])    Peripheral vascular disease ([place])    Pneumonia    PONV (postoperative nausea and vomiting)    Portal hypertension (HCC)    Severe  episode of recurrent major depressive disorder, without psychotic features ([place]) 05/15/2016   Severe obesity (BMI >= 40) ([place]) 05/15/2016   Sleep disturbance 10/21/2019   Splenomegaly a   Stage 3 chronic kidney disease ([place]) 10/21/2019   Thrombocytopenia ([place]) 09/03/2017   Tremor of both hands 05/15/2016   Vitamin D deficiency 11/05/2017    Family History  Problem Relation Age of Onset   Cancer Mother  ovarian   Hyperlipidemia Mother    Alzheimer's disease Mother    Arthritis Mother        rheumatoid   Cancer Father        lung   Parkinson's disease Father    Alzheimer's disease Father    ALS Sister    Cancer Maternal Aunt        pancreatic, lung, liver,breast   Breast cancer Maternal Aunt    Cancer Maternal Uncle        pancreatic, liver, brain   Colon cancer Maternal Uncle    Heart attack Paternal Aunt    Stroke Paternal Aunt    Stomach cancer Paternal Aunt    Heart attack Paternal Uncle    Stroke Paternal Grandmother    Heart attack Paternal Grandfather    Arthritis Sister        rheumatoid   Huntington's disease Daughter        presumed inherited from father per patient   Colon cancer Maternal Uncle    Pancreatic cancer Maternal Uncle    Liver disease Maternal Uncle    Breast cancer Maternal Aunt    Breast cancer Maternal Aunt    Esophageal cancer Neg Hx    Rectal cancer Neg Hx     Past Surgical History:  Procedure Laterality Date   ABDOMINAL HYSTERECTOMY     APPENDECTOMY     BACK SURGERY     thoracic-fractured    BACK SURGERY     lumbar - fractured   BREAST LUMPECTOMY WITH RADIOACTIVE SEED LOCALIZATION Right 08/01/2018   Procedure: RADIOACTIVE SEED GUIDED RIGHT BREAST LUMPECTOMY;  Surgeon: Coralie Keens, MD;  Location: [facility];  Service: General;  Laterality: Right;   COLONOSCOPY     ESOPHAGOGASTRODUODENOSCOPY  05/20/2008   Mild to moderate esophagitis. Otherwise normal EGD.    EYE SURGERY Left    cataract surgery with lens implant    GASTROSTOMY     peptic ulcer - peg tube placement   TOTAL KNEE ARTHROPLASTY Left 01/21/2021   Procedure: LEFT TOTAL KNEE ARTHROPLASTY;  Surgeon: Mcarthur Rossetti, MD;  Location: [facility];  Service: Orthopedics;  Laterality: Left;  Need RNFA   Social History   Occupational History   Not on file  Tobacco Use   Smoking status: Former    Packs/day: 1.00    Years: 22.00    Pack years: 22.00    Types: Cigarettes    Quit date: 06/26/1986    Years since quitting: 34.9   Smokeless tobacco: Never  Vaping Use   Vaping Use: Never used  Substance and Sexual Activity   Alcohol use: No   Drug use: No   Sexual activity: Not Currently    Birth control/protection: None, Surgical

## 2021-06-23 ENCOUNTER — Telehealth: Payer: Self-pay | Admitting: *Deleted

## 2021-06-23 ENCOUNTER — Other Ambulatory Visit: Payer: Self-pay | Admitting: Physician Assistant

## 2021-06-23 MED ORDER — HYDROCODONE-ACETAMINOPHEN 5-325 MG PO TABS: 1.0000 | ORAL_TABLET | Freq: Four times a day (QID) | ORAL | 0 refills | Status: DC | PRN

## 2021-06-23 NOTE — Telephone Encounter (Signed)
Patient called requesting refill of of pain medication. Thanks.

## 2021-06-30 ENCOUNTER — Ambulatory Visit: Payer: Medicare Other | Admitting: Physician Assistant

## 2021-06-30 ENCOUNTER — Other Ambulatory Visit: Payer: Self-pay

## 2021-06-30 ENCOUNTER — Encounter: Payer: Self-pay | Admitting: Physician Assistant

## 2021-06-30 DIAGNOSIS — M25562 Pain in left knee: Secondary | ICD-10-CM | POA: Diagnosis not present

## 2021-06-30 DIAGNOSIS — Z96652 Presence of left artificial knee joint: Secondary | ICD-10-CM | POA: Diagnosis not present

## 2021-06-30 MED ORDER — HYDROCODONE-ACETAMINOPHEN 5-325 MG PO TABS: 1.0000 | ORAL_TABLET | Freq: Four times a day (QID) | ORAL | 0 refills | Status: DC | PRN

## 2021-06-30 NOTE — Progress Notes (Signed)
HPI: Amy Moon comes in today for follow-up of her left knee status post fall little less than a month ago.  She states she is now having some good days and bad days.  It does feel like she is trending towards improvement.  Cold weather makes it knee pain worse.  She does report she is doing some home exercises for quad strengthening.  She is asking for refill on her hydrocodone due to the fact that she is having some pain in the knee and is unable to take NSAIDs.  She is also dealing with her sister who is in hospice care and she is going to [place] to see her.  Physical exam: General well-developed well-nourished female walks with a slight antalgic gait with no assistive device. Left knee no gross instability valgus varus stress and anterior drawer.  Surgical incisions well-healed.  Area of slight ecchymosis over the anterior aspect of the knee.  Good range of motion of the knee without significant pain.  No abnormal warmth erythema or effusion in the knee.  Impression: Status post left total knee arthroplasty Left knee pain acute  Plan: Discussed with her that like further continue to work on quad strengthening.  She continues to have pain in the next 2 months she will follow-up with [place].  Otherwise she will follow-up with [place] at her 1 year postop visit in the end of July.  She will use the Norco which is refilled today sparingly.  Refills on Norco would only be due to the pain if she fails to improve would like to see her back in the office before refilling this medication.  Questions were encouraged and answered at length

## 2021-07-06 ENCOUNTER — Ambulatory Visit: Payer: Medicare Other | Admitting: Orthopaedic Surgery

## 2021-07-07 ENCOUNTER — Other Ambulatory Visit: Payer: Self-pay | Admitting: Physician Assistant

## 2021-07-14 ENCOUNTER — Other Ambulatory Visit: Payer: Self-pay | Admitting: Physician Assistant

## 2021-07-22 ENCOUNTER — Other Ambulatory Visit: Payer: Self-pay | Admitting: Physician Assistant

## 2021-07-29 ENCOUNTER — Other Ambulatory Visit: Payer: Self-pay | Admitting: Physician Assistant

## 2021-08-04 ENCOUNTER — Other Ambulatory Visit: Payer: Self-pay

## 2021-08-04 ENCOUNTER — Telehealth: Payer: Self-pay | Admitting: Gastroenterology

## 2021-08-04 ENCOUNTER — Telehealth: Payer: Self-pay | Admitting: Physician Assistant

## 2021-08-04 ENCOUNTER — Ambulatory Visit: Payer: Medicare Other | Admitting: Physician Assistant

## 2021-08-04 DIAGNOSIS — Z1231 Encounter for screening mammogram for malignant neoplasm of breast: Secondary | ICD-10-CM

## 2021-08-04 NOTE — Telephone Encounter (Signed)
Patient is requesting order for mammogram to [facility]. Please advise. [phone]

## 2021-08-04 NOTE — Telephone Encounter (Signed)
Order placed and patient notified 

## 2021-08-04 NOTE — Telephone Encounter (Signed)
Inbound call from patient requesting additional refills for promethazine medication please.

## 2021-08-04 NOTE — Telephone Encounter (Signed)
Left a detailed voicemail that I am unable to refill her prescription for promethazine. It has been a while since she was in an had this filled. I will need to speak with Dr Lyndel Safe regarding this and he not in until next week

## 2021-08-08 ENCOUNTER — Other Ambulatory Visit: Payer: Self-pay | Admitting: Physician Assistant

## 2021-08-12 ENCOUNTER — Other Ambulatory Visit: Payer: Self-pay

## 2021-08-12 ENCOUNTER — Ambulatory Visit (INDEPENDENT_AMBULATORY_CARE_PROVIDER_SITE_OTHER): Payer: Medicare HMO | Admitting: Physician Assistant

## 2021-08-12 ENCOUNTER — Encounter: Payer: Self-pay | Admitting: Physician Assistant

## 2021-08-12 VITALS — BP 129/75 | HR 98 | Temp 98.7°F | Ht 60.0 in | Wt 141.0 lb

## 2021-08-12 DIAGNOSIS — M436 Torticollis: Secondary | ICD-10-CM | POA: Diagnosis not present

## 2021-08-12 DIAGNOSIS — R509 Fever, unspecified: Secondary | ICD-10-CM | POA: Diagnosis not present

## 2021-08-12 DIAGNOSIS — G4700 Insomnia, unspecified: Secondary | ICD-10-CM

## 2021-08-12 DIAGNOSIS — R519 Headache, unspecified: Secondary | ICD-10-CM

## 2021-08-12 NOTE — Progress Notes (Signed)
Established patient acute visit   Patient: Amy Moon   DOB: January 30, 1951   71 y.o. Female  MRN: [id number] Visit Date: 08/12/2021  Chief Complaint  Patient presents with   Acute Visit   Subjective    HPI  Patient presents with c/o fever that has been intermittent for about 1-2 months. Reports temperatures range from 99.0-101.4 and once Tylenol starts wearing off temperature will start to go up. Taking Tylenol 1000 mg about every 4 hours.  Also reports neck stiffness which started 3-4 weeks ago. Sometimes does have a headache which she describes is mostly posterior. 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 States does have forgetfulness, sometimes will be driving and forget where she is going. Denies seizure, night sweats, chills, or sick contact exposures. Also reports her husband left the house and sister passed away 4 weeks ago so under increased stress. Patient reports her rash comes and goes. Seems to flare-up (rash) when she is emotionally upset or stressed. Has not seen dermatology. Also reports weight loss but has only been eating one meal per day (usually dinner) and some fruit throughout the day. Pt has hx of angina pectoris, denies worsening symptoms. Patient states continues to struggle with sleep.    Medications: Outpatient Medications Prior to Visit  Medication Sig   atorvastatin (LIPITOR) 10 MG tablet TAKE 1 TABLET (10 MG TOTAL) BY MOUTH DAILY.   bisacodyl (DULCOLAX) 5 MG EC tablet Take 15 mg by mouth at bedtime as needed for moderate constipation.   clopidogrel (PLAVIX) 75 MG tablet Take 1 tablet (75 mg total) by mouth daily.   cyclobenzaprine (FLEXERIL) 10 MG tablet TAKE 1 TABLET BY MOUTH EVERY 8 HOURS AS NEEDED FOR MUSCLE SPASMS.   diphenhydrAMINE (BENADRYL) 25 MG tablet Take 25 mg by mouth every 6 (six) hours as needed for allergies.   furosemide (LASIX) 40 MG tablet Take 1 tablet (40 mg total) by mouth daily.   hydrOXYzine  (VISTARIL) 25 MG capsule Take 1 capsule (25 mg total) by mouth at bedtime as needed.   losartan (COZAAR) 50 MG tablet Take 1 tablet (50 mg total) by mouth daily.   nitroGLYCERIN (NITROSTAT) 0.4 MG SL tablet Place 0.4 mg under the tongue every 5 (five) minutes as needed for chest pain.    pantoprazole (PROTONIX) 40 MG tablet Take 1 tablet (40 mg total) by mouth daily.   potassium chloride (KLOR-CON) 10 MEQ tablet TAKE 1 TABLET (10 MEQ TOTAL) BY MOUTH DAILY.   promethazine (PHENERGAN) 25 MG tablet Take 1 tablet (25 mg total) by mouth every 6 (six) hours as needed for nausea or vomiting.   ranolazine (RANEXA) 1000 MG SR tablet TAKE 1 TABLET BY MOUTH 2 TIMES DAILY. (Patient taking differently: Take 1,000 mg by mouth 2 (two) times daily.)   [DISCONTINUED] HYDROcodone-acetaminophen (NORCO/VICODIN) 5-325 MG tablet TAKE 1 TABLET BY MOUTH EVERY 6 HOURS AS NEEDED FOR MODERATE PAIN.   No facility-administered medications prior to visit.    Review of Systems Review of Systems:  A fourteen system review of systems was performed and found to be positive as per HPI.     Objective    BP 129/75    Pulse 98    Temp 98.7 F (37.1 C)    Ht 5' (1.524 m)    Wt 141 lb (64 kg)    SpO2 98%    BMI 27.54 kg/m    Physical Exam  General:  Well Developed, well  nourished, appropriate for stated age.  Neuro:  Alert and oriented,  extra-ocular muscles intact, CN II-XII grossly intact  HEENT:  Normocephalic, atraumatic, no sinus tenderness, PERRL,+posterior adenopathy   Skin:  no rash noted Cardiac:  RRR, S1 S2, no murmur Respiratory: CTA B/Lw/o wheezing.  MSK: limited cervical rotational movement bilaterally, tenderness of neck, nuchal rigidity, negative Brudzinski sign, pt unable to fully extend left knee due to hx of knee replacement, negative Kernig's sign of right knee Vascular:  Ext warm, no cyanosis apprec.; cap RF less 2 sec. Psych:  No HI/SI, judgement and insight good, Euthymic mood. Full Affect.   No  results found for any visits on 08/12/21.  Assessment & Plan     Patient c/o fever and rash at last office visit 06/02/2021 and labs collected were unremarkable (CBC w/d, CRP, ESR, RF, ANA). Etiology of symptoms unclear. Discussed with patient recent onset of cervical stiffness and headache with continued fever recommend referral to neurology for further evaluation, especially to r/o type of meningitis. Discussed red flag s/sx to monitor for and is aware to go to ED for evaluation. Will repeat CBC and inflammatory markers. Pt has lost 10 pounds since last visit 2 months ago and decreased calorie intake likely contributing. Discussed with patient to avoid skipping meals and having at least 3 small meals with intermittent snacks. If neurology work-up is negative, then recommend referral to ID for further evaluation of fever.   Insomnia: -Uncontrolled. Patient has tried and failed several medications including Trazodone, Lunesta, Mirtazapine, Seroquel, Hydroxyzine. Recommend referral to sleep specialist/neurology. Pt is agreeable.    Return if symptoms worsen or fail to improve.        Lorrene Reid, PA-C  [facility] at [facility] [phone] (phone) [phone] (fax)  [place]

## 2021-08-12 NOTE — Patient Instructions (Signed)
Fever, Adult   A fever is an increase in your body's temperature. It often means a temperature of 100.67F (38C) or higher. Brief mild or moderate fevers often have no long-term effects. They often do not need treatment. Moderate or high fevers may make you feel uncomfortable. Sometimes, they can be a sign of a serious illness or disease. A fever that keeps coming back or that lasts a long time may cause you to lose water in your body (get dehydrated). You can take your temperature with a thermometer to see if you have a fever. Temperature can change with: Age. Time of day. Where the thermometer is put in the body. Readings may vary when the thermometer is put: In the mouth (oral). In the butt (rectal). In the ear (tympanic). Under the arm (axillary). On the forehead (temporal). Follow these instructions at home: Medicines Take over-the-counter and prescription medicines only as told by your doctor. Follow the dosing instructions carefully. If you were prescribed an antibiotic medicine, take it as told by your doctor. Do not stop taking it even if you start to feel better. General instructions Watch for any changes in your symptoms. Tell your doctor about them. Rest as needed. Drink enough fluid to keep your pee (urine) pale yellow. Sponge yourself or bathe with room-temperature water as needed. This helps to lower your body temperature. Do not use ice water. Do not use too many blankets or wear clothes that are too heavy. If your fever was caused by an infection that spreads from person to person (is contagious), such as a cold or the flu: You should stay home from work and public places for at least 24 hours after your fever is gone. Your fever should be gone for at least 24 hours without the need to use medicines. Contact a doctor if: You throw up (vomit). You cannot eat or drink without throwing up. You have watery poop (diarrhea). It hurts when you pee. Your symptoms do not get  better with treatment. You have new symptoms. You feel very weak. Get help right away if: You are short of breath or have trouble breathing. You are dizzy or you pass out (faint). You feel mixed up (confused). You have signs of not having enough water in your body, such as: Dark pee, very little pee, or no pee. Cracked lips. Dry mouth. Sunken eyes. Sleepiness. Weakness. You have very bad pain in your belly (abdomen). You keep throwing up or having watery poop. You have a rash on your skin. Your symptoms get worse all of a sudden. Summary A fever is an increase in your body's temperature. It often means a temperature of 100.67F (38C) or higher. Watch for any changes in your symptoms. Tell your doctor about them. Take all medicines only as told by your doctor. Do not go to work or other public places if your fever was caused by an illness that can spread to other people. Get help right away if you have signs that you do not have enough water in your body. This information is not intended to replace advice given to you by your health care provider. Make sure you discuss any questions you have with your health care provider. Document Revised: 11/02/2020 Document Reviewed: 11/02/2020 Elsevier Patient Education  [place].

## 2021-08-13 LAB — CBC WITH DIFFERENTIAL/PLATELET
Basophils Absolute: 0.1 10*3/uL (ref 0.0–0.2)
Basos: 1 %
EOS (ABSOLUTE): 0.1 10*3/uL (ref 0.0–0.4)
Eos: 2 %
Hematocrit: 38.8 % (ref 34.0–46.6)
Hemoglobin: 13.4 g/dL (ref 11.1–15.9)
Immature Grans (Abs): 0 10*3/uL (ref 0.0–0.1)
Immature Granulocytes: 0 %
Lymphocytes Absolute: 1.8 10*3/uL (ref 0.7–3.1)
Lymphs: 32 %
MCH: 32.8 pg (ref 26.6–33.0)
MCHC: 34.5 g/dL (ref 31.5–35.7)
MCV: 95 fL (ref 79–97)
Monocytes Absolute: 0.3 10*3/uL (ref 0.1–0.9)
Monocytes: 6 %
Neutrophils Absolute: 3.5 10*3/uL (ref 1.4–7.0)
Neutrophils: 59 %
Platelets: 173 10*3/uL (ref 150–450)
RBC: 4.08 x10E6/uL (ref 3.77–5.28)
RDW: 12 % (ref 11.7–15.4)
WBC: 5.8 10*3/uL (ref 3.4–10.8)

## 2021-08-13 LAB — COMPREHENSIVE METABOLIC PANEL
ALT: 13 IU/L (ref 0–32)
AST: 18 IU/L (ref 0–40)
Albumin/Globulin Ratio: 2 (ref 1.2–2.2)
Albumin: 4.7 g/dL (ref 3.7–4.7)
Alkaline Phosphatase: 106 IU/L (ref 44–121)
BUN/Creatinine Ratio: 11 — ABNORMAL LOW (ref 12–28)
BUN: 8 mg/dL (ref 8–27)
Bilirubin Total: 0.7 mg/dL (ref 0.0–1.2)
CO2: 27 mmol/L (ref 20–29)
Calcium: 9.7 mg/dL (ref 8.7–10.3)
Chloride: 102 mmol/L (ref 96–106)
Creatinine, Ser: 0.7 mg/dL (ref 0.57–1.00)
Globulin, Total: 2.4 g/dL (ref 1.5–4.5)
Glucose: 106 mg/dL — ABNORMAL HIGH (ref 70–99)
Potassium: 3.2 mmol/L — ABNORMAL LOW (ref 3.5–5.2)
Sodium: 143 mmol/L (ref 134–144)
Total Protein: 7.1 g/dL (ref 6.0–8.5)
eGFR: 91 mL/min/{1.73_m2} (ref >59)

## 2021-08-13 LAB — SEDIMENTATION RATE: Sed Rate: 5 mm/hr (ref 0–40)

## 2021-08-13 LAB — C-REACTIVE PROTEIN: CRP: 2 mg/L (ref 0–10)

## 2021-08-22 ENCOUNTER — Other Ambulatory Visit: Payer: Medicare HMO | Admitting: Physician Assistant

## 2021-08-26 ENCOUNTER — Other Ambulatory Visit: Payer: Medicare HMO

## 2021-08-31 ENCOUNTER — Telehealth: Payer: Self-pay | Admitting: Physician Assistant

## 2021-08-31 ENCOUNTER — Other Ambulatory Visit: Payer: Self-pay

## 2021-08-31 ENCOUNTER — Other Ambulatory Visit: Payer: Medicare HMO

## 2021-08-31 ENCOUNTER — Other Ambulatory Visit: Payer: Self-pay | Admitting: Nurse Practitioner

## 2021-08-31 DIAGNOSIS — Z0000 Encounter for general adult medical examination without abnormal findings: Secondary | ICD-10-CM

## 2021-08-31 DIAGNOSIS — Z130 Encounter for screening for diseases of the blood and blood-forming organs and certain disorders involving the immune mechanism: Secondary | ICD-10-CM

## 2021-08-31 DIAGNOSIS — Z1321 Encounter for screening for nutritional disorder: Secondary | ICD-10-CM | POA: Diagnosis not present

## 2021-08-31 DIAGNOSIS — Z13228 Encounter for screening for other metabolic disorders: Secondary | ICD-10-CM | POA: Diagnosis not present

## 2021-08-31 DIAGNOSIS — E876 Hypokalemia: Secondary | ICD-10-CM

## 2021-08-31 DIAGNOSIS — Z1329 Encounter for screening for other suspected endocrine disorder: Secondary | ICD-10-CM | POA: Diagnosis not present

## 2021-08-31 MED ORDER — POTASSIUM CHLORIDE ER 10 MEQ PO TBCR: 10.0000 meq | EXTENDED_RELEASE_TABLET | Freq: Every day | ORAL | 1 refills | Status: DC

## 2021-08-31 NOTE — Telephone Encounter (Signed)
Patient is aware 

## 2021-08-31 NOTE — Telephone Encounter (Signed)
The patient definitely needs this. I have sent ne wrx to [facility]

## 2021-08-31 NOTE — Telephone Encounter (Signed)
Patient requesting refill of her potassium. Please advise. [phone] ?

## 2021-09-01 LAB — COMPREHENSIVE METABOLIC PANEL
ALT: 13 IU/L (ref 0–32)
AST: 17 IU/L (ref 0–40)
Albumin/Globulin Ratio: 2.2 (ref 1.2–2.2)
Albumin: 4.3 g/dL (ref 3.7–4.7)
Alkaline Phosphatase: 125 IU/L — ABNORMAL HIGH (ref 44–121)
BUN/Creatinine Ratio: 13 (ref 12–28)
BUN: 8 mg/dL (ref 8–27)
Bilirubin Total: 0.4 mg/dL (ref 0.0–1.2)
CO2: 21 mmol/L (ref 20–29)
Calcium: 9.5 mg/dL (ref 8.7–10.3)
Chloride: 106 mmol/L (ref 96–106)
Creatinine, Ser: 0.61 mg/dL (ref 0.57–1.00)
Globulin, Total: 2 g/dL (ref 1.5–4.5)
Glucose: 108 mg/dL — ABNORMAL HIGH (ref 70–99)
Potassium: 3.5 mmol/L (ref 3.5–5.2)
Sodium: 145 mmol/L — ABNORMAL HIGH (ref 134–144)
Total Protein: 6.3 g/dL (ref 6.0–8.5)
eGFR: 94 mL/min/{1.73_m2} (ref >59)

## 2021-09-01 LAB — LIPID PANEL
Chol/HDL Ratio: 2.3 ratio (ref 0.0–4.4)
Cholesterol, Total: 140 mg/dL (ref 100–199)
HDL: 61 mg/dL (ref >39)
LDL Chol Calc (NIH): 63 mg/dL (ref 0–99)
Triglycerides: 81 mg/dL (ref 0–149)
VLDL Cholesterol Cal: 16 mg/dL (ref 5–40)

## 2021-09-01 LAB — CBC WITH DIFFERENTIAL/PLATELET
Basophils Absolute: 0 10*3/uL (ref 0.0–0.2)
Basos: 1 %
EOS (ABSOLUTE): 0.1 10*3/uL (ref 0.0–0.4)
Eos: 3 %
Hematocrit: 36.1 % (ref 34.0–46.6)
Hemoglobin: 12.4 g/dL (ref 11.1–15.9)
Immature Grans (Abs): 0 10*3/uL (ref 0.0–0.1)
Immature Granulocytes: 0 %
Lymphocytes Absolute: 1.6 10*3/uL (ref 0.7–3.1)
Lymphs: 42 %
MCH: 32 pg (ref 26.6–33.0)
MCHC: 34.3 g/dL (ref 31.5–35.7)
MCV: 93 fL (ref 79–97)
Monocytes Absolute: 0.2 10*3/uL (ref 0.1–0.9)
Monocytes: 6 %
Neutrophils Absolute: 1.9 10*3/uL (ref 1.4–7.0)
Neutrophils: 48 %
Platelets: 156 10*3/uL (ref 150–450)
RBC: 3.87 x10E6/uL (ref 3.77–5.28)
RDW: 11.9 % (ref 11.7–15.4)
WBC: 3.9 10*3/uL (ref 3.4–10.8)

## 2021-09-01 LAB — HEMOGLOBIN A1C
Est. average glucose Bld gHb Est-mCnc: 97 mg/dL
Hgb A1c MFr Bld: 5 % (ref 4.8–5.6)

## 2021-09-01 LAB — TSH: TSH: 0.225 u[IU]/mL — ABNORMAL LOW (ref 0.450–4.500)

## 2021-09-02 ENCOUNTER — Other Ambulatory Visit: Payer: Self-pay

## 2021-09-02 ENCOUNTER — Encounter: Payer: Self-pay | Admitting: Gastroenterology

## 2021-09-02 ENCOUNTER — Ambulatory Visit: Payer: Medicare HMO | Admitting: Gastroenterology

## 2021-09-02 ENCOUNTER — Telehealth: Payer: Self-pay

## 2021-09-02 VITALS — BP 150/70 | HR 110 | Ht 60.0 in | Wt 144.5 lb

## 2021-09-02 DIAGNOSIS — R1011 Right upper quadrant pain: Secondary | ICD-10-CM | POA: Diagnosis not present

## 2021-09-02 DIAGNOSIS — K7469 Other cirrhosis of liver: Secondary | ICD-10-CM

## 2021-09-02 DIAGNOSIS — R112 Nausea with vomiting, unspecified: Secondary | ICD-10-CM | POA: Diagnosis not present

## 2021-09-02 DIAGNOSIS — R1310 Dysphagia, unspecified: Secondary | ICD-10-CM | POA: Diagnosis not present

## 2021-09-02 DIAGNOSIS — K219 Gastro-esophageal reflux disease without esophagitis: Secondary | ICD-10-CM | POA: Diagnosis not present

## 2021-09-02 MED ORDER — PANTOPRAZOLE SODIUM 40 MG PO TBEC: 40.0000 mg | DELAYED_RELEASE_TABLET | Freq: Two times a day (BID) | ORAL | 3 refills | Status: DC

## 2021-09-02 MED ORDER — HYDROCORTISONE (PERIANAL) 2.5 % EX CREA: 1.0000 "application " | TOPICAL_CREAM | Freq: Two times a day (BID) | CUTANEOUS | 1 refills | Status: DC

## 2021-09-02 MED ORDER — PROMETHAZINE HCL 25 MG PO TABS: 25.0000 mg | ORAL_TABLET | Freq: Four times a day (QID) | ORAL | 2 refills | Status: DC | PRN

## 2021-09-02 MED ORDER — PANTOPRAZOLE SODIUM 40 MG PO TBEC: 40.0000 mg | DELAYED_RELEASE_TABLET | Freq: Two times a day (BID) | ORAL | 0 refills | Status: DC

## 2021-09-02 NOTE — Patient Instructions (Addendum)
If you are age 73 or older, your body mass index should be between 23-30. Your Body mass index is 28.22 kg/m?Marland Kitchen If this is out of the aforementioned range listed, please consider follow up with your Primary Care Provider. ? ?If you are age 33 or younger, your body mass index should be between 19-25. Your Body mass index is 28.22 kg/m?Marland Kitchen If this is out of the aformentioned range listed, please consider follow up with your Primary Care Provider.  ? ?________________________________________________________ ? ?The [facility] providers would like to encourage you to use [facility] to communicate with providers for non-urgent requests or questions.  Due to long hold times on the telephone, sending your provider a message by [facility] may be a faster and more efficient way to get a response.  Please allow 48 business hours for a response.  Please remember that this is for non-urgent requests.  ?_______________________________________________________ ? ?We have sent the following medications to your pharmacy for you to pick up at your convenience: ?Protonix-Please call in 2 weeks and check to see if other script was received by your mail order ?Phenergan ?Anusol Cream ? ?You will be contacted by our office prior to your procedure for directions on holding your Plavix.  If you do not hear from our office 1 week prior to your scheduled procedure, please call [phone] to discuss.  ? ?You have been scheduled for an endoscopy. Please follow written instructions given to you at your visit today. ?If you use inhalers (even only as needed), please bring them with you on the day of your procedure. ? ?Please call in 6 months to schedule follow up appointment. ? ?Thank you, ? ?Dr. Jackquline Denmark ? ? ?

## 2021-09-02 NOTE — Telephone Encounter (Signed)
[facility] Pre-operative Risk Assessment  ?   ?Request for surgical clearance:     Endoscopy Procedure ? ?What type of surgery is being performed?     EGD ? ?When is this surgery scheduled?     09-14-2021 ? ?What type of clearance is required ?   Pharmacy ? ?Are there any medications that need to be held prior to surgery and how long? Plavix 5 day hold ? ?Practice name and name of physician performing surgery?      [facility] ? ?What is your office phone and fax number?      Phone- [phone]  Fax- [phone] ? ?Anesthesia type (None, local, MAC, general) ?       [facility] ? ?

## 2021-09-02 NOTE — Progress Notes (Signed)
IMPRESSION and PLAN:    #1. N/V with RUQ pain/wt loss. Failed zofran. Better with phenergan. US showed gallstone 08/2019, neg HIDA for acute cholecystitis. Neg EGD. Still can have biliary colic, due to medications esp opoids, r/o gastroparesis or functional component.  #2. GERD with dysphagia (resolved after dil 06/2019) d/t distal eso stricture, 2 cm HH. Neg Bx for EoE. H/O food impaction 2003 s/p endoscopic disimpaction. Has assoc oropharyngeal dysphagia d/t ?Parkinson's.  Had PEJ 2012 (removed 2017).   #3. Colorectal cancer screening: Colonoscopy 06/2019: 6 mm TA s/p polypectomy, mild sig div, highly redundant colon. Rpt colon only if any problems.  #4. 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 Likely d/t NASH. No ETOH, neg Hep C. No varices on EGD 06/2019 or ascites on [place] 04/2021. CT AP July 2021 neg for any masses. No HE. Nl AFP  #6. Chronic constipation (likely OIC). Failed miralax, colace. Better with 3 dulcolax/day. Neg colon 06/2019. Occ rectal bleeding d/t hoids.  #7. Asymptomatic cholelithiasis. Neg HIDA 09/2019 for acute cholecystitis.   Plan: -Increase Protonix '40mg'$  po BID  -Phenergan '25mg'$  po QID prn #45 2RF. Sedation and fall precautions were discussed. -EGD for EV screening off plavix x 5 days. -If still with problems, proceed with solid-phase GES. -Continue dulcolax. -Minimize pain medications. -Check weight every week and record.  If there is any further weight loss, CT Abdo/pelvis with p.o. and IV contrast. -HC 2.5 % BID PR BID x 10 days, 2RF -FU in 6 months.  HPI:    Chief Complaint:   Amy Moon is a 71 y.o. female  With CAD on Plavix, HTN, cellulitis along L knee replacement scar (recently treated with doxycycline)  With N/V/heartburn/occ RUQ pain.  Phenergan is only medication which helps with nausea. Does not have good appetite.  She has lost weight as  below. Sis recently Dx with panc Ca  Underwent L knee arthroplasty 01/21/2021. Hb 11 to 8.6 s/p 2U   No jaundice dark urine or pale stools.  No abdominal distention.  Denies having any history of confusion or memory lapses.  No easy bruisability.  Has chronic constipation exacerbated by pain medications.  Dulcolax does help.  Occasional rectal bleeding due to hemorrhoids.  Wants something stronger for hemorrhoids.  Nl CBC (except plt 149), CMP (alb 4.3), PT INR, AFP Nov 2022  Wt Readings from Last 3 Encounters:  09/02/21 144 lb 8 oz (65.5 kg)  08/12/21 141 lb (64 kg)  06/02/21 151 lb 8 oz (68.7 kg)     From previous records: Underwent RUQ [place] which showed gallstone.  She had + [place] Murphy's sign.Thereafter, underwent HIDA scan which was neg for acute cholecystitis.  Most recently underwent CT Abdo/pelvis 12/25/2019 which was neg for any acute abn.  Wt Readings from Last 3 Encounters:  09/02/21 144 lb 8 oz (65.5 kg)  08/12/21 141 lb (64 kg)  06/02/21 151 lb 8 oz (68.7 kg)     Underwent EGD and colonoscopy January 2021  Past GI procedures: EGD: 07/16/2019 distal esophageal stricture s/p dilatation, grade A esophagitis, small hiatal hernia.  Colonoscopy Jan 2021:  6 mm colonic polyp s/p polypectomy, mild sig div. Otherwise Nl to TI.  Bx- tubular adenoma.  Rpt not indicated d/t age.  CT AP with contrast 12/25/2019 1. No acute findings within the abdomen or pelvis. 2. Hepatic cirrhosis and findings of portal venous hypertension. No evidence of  hepatic neoplasm.  No ascites.  Mild splenomegaly. 3. Cholelithiasis. No radiographic evidence of cholecystitis.  [place] 04/2021 1. Cholelithiasis with no findings of acute cholecystitis. 2. Hepatic steatosis. Please note limited evaluation for focal hepatic masses in a patient with hepatic steatosis due to decreased penetration of the acoustic ultrasound waves. 3. Nonvisualized mid aorta.   [place] 08/2019 1. There is a 1.4 cm stone in the  gallbladder. A positive Murphy's sign is reported. However, there is no wall thickening or pericholecystic fluid. Recommend clinical correlation. If there is concern for cholecystitis, recommend HIDA scan.  HIDA: neg 2. Probable hepatic steatosis. 3. No other abnormalities.   Past Medical History:  Diagnosis Date   Angina pectoris (HCC)    Chronic kidney disease    Drug-induced low platelet count    Duodenal ulcer    Granulomatous disease ([place])    Hepatic disease    Hypertension    Low vitamin D level    Neuropathy    Osteopenia    Parkinson's disease (HCC)        Splenomegaly a    Current Outpatient Medications  Medication Sig Dispense Refill   bisacodyl (DULCOLAX) 5 MG EC tablet Take 15 mg by mouth at bedtime as needed for moderate constipation.     clopidogrel (PLAVIX) 75 MG tablet Take 1 tablet (75 mg total) by mouth daily. 90 tablet 3   cyclobenzaprine (FLEXERIL) 10 MG tablet TAKE 1 TABLET BY MOUTH EVERY 8 HOURS AS NEEDED FOR MUSCLE SPASMS. 60 tablet 1   diphenhydrAMINE (BENADRYL) 25 MG tablet Take 25 mg by mouth every 6 (six) hours as needed for allergies.     furosemide (LASIX) 40 MG tablet Take 1 tablet (40 mg total) by mouth daily. 90 tablet 2   losartan (COZAAR) 50 MG tablet Take 1 tablet (50 mg total) by mouth daily. 90 tablet 0   nitroGLYCERIN (NITROSTAT) 0.4 MG SL tablet Place 0.4 mg under the tongue every 5 (five) minutes as needed for chest pain.      pantoprazole (PROTONIX) 40 MG tablet Take 1 tablet (40 mg total) by mouth daily. 30 tablet 6   potassium chloride (KLOR-CON) 10 MEQ tablet Take 1 tablet (10 mEq total) by mouth daily. (Patient taking differently: Take 10 mEq by mouth 2 (two) times daily.) 90 tablet 1   promethazine (PHENERGAN) 25 MG tablet Take 1 tablet (25 mg total) by mouth every 6 (six) hours as needed for nausea or vomiting. 45 tablet 0   ranolazine (RANEXA) 1000 MG SR tablet TAKE 1 TABLET BY MOUTH 2 TIMES DAILY. (Patient taking differently: Take  1,000 mg by mouth 2 (two) times daily.) 60 tablet 11   atorvastatin (LIPITOR) 10 MG tablet TAKE 1 TABLET (10 MG TOTAL) BY MOUTH DAILY. 90 tablet 1   No current facility-administered medications for this visit.    Past Surgical History:  Procedure Laterality Date   ABDOMINAL HYSTERECTOMY     APPENDECTOMY     BACK SURGERY     thoracic-fractured    BACK SURGERY     lumbar - fractured   BREAST LUMPECTOMY WITH RADIOACTIVE SEED LOCALIZATION Right 08/01/2018   Procedure: RADIOACTIVE SEED GUIDED RIGHT BREAST LUMPECTOMY;  Surgeon: Coralie Keens, MD;  Location: [place];  Service: General;  Laterality: Right;   COLONOSCOPY     ESOPHAGOGASTRODUODENOSCOPY  05/20/2008   Mild to moderate esophagitis. Otherwise normal EGD.    EYE SURGERY Left    cataract surgery with lens implant   GASTROSTOMY  peptic ulcer - peg tube placement   TOTAL KNEE ARTHROPLASTY Left 01/21/2021   Procedure: LEFT TOTAL KNEE ARTHROPLASTY;  Surgeon: Mcarthur Rossetti, MD;  Location: [facility];  Service: Orthopedics;  Laterality: Left;  Need RNFA    Family History  Problem Relation Age of Onset   Cancer Mother        ovarian   Hyperlipidemia Mother    Alzheimer's disease Mother    Arthritis Mother        rheumatoid   Cancer Father        lung   Parkinson's disease Father    Alzheimer's disease Father    ALS Sister    Cancer Maternal Aunt        pancreatic, lung, liver,breast   Breast cancer Maternal Aunt    Cancer Maternal Uncle        pancreatic, liver, brain   Colon cancer Maternal Uncle    Heart attack Paternal Aunt    Stroke Paternal Aunt    Stomach cancer Paternal Aunt    Heart attack Paternal Uncle    Stroke Paternal Grandmother    Heart attack Paternal Grandfather    Arthritis Sister        rheumatoid   Huntington's disease Daughter        presumed inherited from father per patient   Colon cancer Maternal Uncle    Pancreatic cancer Maternal Uncle    Liver disease Maternal Uncle    Breast  cancer Maternal Aunt    Breast cancer Maternal Aunt    Esophageal cancer Neg Hx    Rectal cancer Neg Hx     Social History   Tobacco Use   Smoking status: Former    Packs/day: 1.00    Years: 22.00    Pack years: 22.00    Types: Cigarettes    Quit date: 06/26/1986    Years since quitting: 35.2   Smokeless tobacco: Never  Vaping Use   Vaping Use: Never used  Substance Use Topics   Alcohol use: No   Drug use: No    Allergies  Allergen Reactions   Iodinated Contrast Media Hives   Nsaids Other (See Comments)    History of bleeding ulcers   Latex Hives and Rash   Sinemet [Carbidopa W-Levodopa] Nausea And Vomiting   Inderal [Propranolol] Other (See Comments)    Unknown   Indomethacin Other (See Comments)    Unknown   Penicillin G Other (See Comments)    Unknown   Pneumococcal Vaccines Other (See Comments)    Caused "pneumonia"   Scopolamine Other (See Comments)    The patch, caused her to pass out / change in mental status   Tape Other (See Comments)    Unknown     Review of Systems: All systems reviewed and negative except where noted in HPI.    Physical Exam:    Today's Vitals   09/02/21 1508  BP: (!) 150/70  Pulse: (!) 110  SpO2: 99%  Weight: 144 lb 8 oz (65.5 kg)  Height: 5' (1.524 m)   Body mass index is 28.22 kg/m. Gen: awake, alert, NAD HEENT: anicteric, no pallor CV: RRR, no mrg Pulm: CTA b/l Abd: soft, NT/ND, +BS throughout Ext: no c/c/e Neuro: nonfocal     Tycen Dockter,MD 09/02/2021, 3:14 PM   CC Abonza, [place]za, PA-C

## 2021-09-05 NOTE — Telephone Encounter (Signed)
Please can someone follow up on this ?

## 2021-09-06 NOTE — Telephone Encounter (Signed)
Can someone please look at this? ?

## 2021-09-06 NOTE — Telephone Encounter (Signed)
Sorry to send this to you personally but this has been sitting in the Pre op pool for a few days and I was trying to get an answer regarding her plavix 5 days prior to her procedure which is on March 22nd  ?

## 2021-09-07 ENCOUNTER — Other Ambulatory Visit: Payer: Self-pay | Admitting: Cardiology

## 2021-09-07 NOTE — Telephone Encounter (Signed)
Dr Agustin Cree,  ? ? I am sorry to send this to you personally but I have sent this to the pre op pool multiple times and they haven't answered. I am trying to see if this patient can stop taking her Plavix 5 days before her procedure which is on 09-14-2021. Please let me know. Thank you so much ? ?

## 2021-09-07 NOTE — Telephone Encounter (Signed)
Dr Lyndel Safe, ? ?Dr Agustin Cree says that he prefers not to discontinue her Plavix. Please advise about this because her procedure is on 3-22 so I can let the patient know. Thank you ?

## 2021-09-08 NOTE — Telephone Encounter (Signed)
LVM for patient to call back regarding her clearance ?

## 2021-09-08 NOTE — Telephone Encounter (Signed)
Lvm again

## 2021-09-08 NOTE — Telephone Encounter (Signed)
LMV on emergency contact number. Mychart sent ?

## 2021-09-08 NOTE — Telephone Encounter (Signed)
No problems ?Lets do it on Plavix ?She does need to hold Plavix 1 day before and not take it on the day of procedure ?If EGD is negative, would resume Plavix from the same day of procedure ?RG ?

## 2021-09-09 NOTE — Telephone Encounter (Signed)
Patient made aware.

## 2021-09-14 ENCOUNTER — Other Ambulatory Visit: Payer: Self-pay

## 2021-09-14 ENCOUNTER — Ambulatory Visit (AMBULATORY_SURGERY_CENTER): Payer: Medicare HMO | Admitting: Gastroenterology

## 2021-09-14 ENCOUNTER — Encounter: Payer: Self-pay | Admitting: Gastroenterology

## 2021-09-14 VITALS — BP 126/64 | HR 87 | Temp 98.9°F | Resp 16 | Ht 61.0 in | Wt 144.0 lb

## 2021-09-14 DIAGNOSIS — K222 Esophageal obstruction: Secondary | ICD-10-CM

## 2021-09-14 DIAGNOSIS — R1310 Dysphagia, unspecified: Secondary | ICD-10-CM

## 2021-09-14 DIAGNOSIS — R112 Nausea with vomiting, unspecified: Secondary | ICD-10-CM

## 2021-09-14 DIAGNOSIS — K317 Polyp of stomach and duodenum: Secondary | ICD-10-CM

## 2021-09-14 DIAGNOSIS — K449 Diaphragmatic hernia without obstruction or gangrene: Secondary | ICD-10-CM

## 2021-09-14 MED ORDER — SODIUM CHLORIDE 0.9 % IV SOLN: 500.0000 mL | Freq: Once | INTRAVENOUS | Status: DC

## 2021-09-14 NOTE — Progress Notes (Signed)
To Pacu, VSS. Report to [email or web address] 

## 2021-09-14 NOTE — Progress Notes (Signed)
Called to room to assist during endoscopic procedure.  Patient ID and intended procedure confirmed with present staff. Received instructions for my participation in the procedure from the performing physician.  

## 2021-09-14 NOTE — Patient Instructions (Signed)
Handout on hiatal hernia, post esophageal dilation diet, and polyps given. ? ?May resume Plavix from today onwards. ? ?Continue Protonix 40 mg twice daily for 4 weeks, then once a day indefinitely.  (Patients states she has just refilled protonix)   ? ?Post dilations diet:  nothing to eat or drink until 1134.  Clear liquids from 1134-1234.  Soft diet the rest of the day. ? ?YOU HAD AN ENDOSCOPIC PROCEDURE TODAY AT [facility]:   Refer to the procedure report that was given to you for any specific questions about what was found during the examination.  If the procedure report does not answer your questions, please call your gastroenterologist to clarify.  If you requested that your care partner not be given the details of your procedure findings, then the procedure report has been included in a sealed envelope for you to review at your convenience later. ? ?YOU SHOULD EXPECT: Some feelings of bloating in the abdomen. Passage of more gas than usual.  Walking can help get rid of the air that was put into your GI tract during the procedure and reduce the bloating. If you had a lower endoscopy (such as a colonoscopy or flexible sigmoidoscopy) you may notice spotting of blood in your stool or on the toilet paper. If you underwent a bowel prep for your procedure, you may not have a normal bowel movement for a few days. ? ?Please Note:  You might notice some irritation and congestion in your nose or some drainage.  This is from the oxygen used during your procedure.  There is no need for concern and it should clear up in a day or so. ? ?SYMPTOMS TO REPORT IMMEDIATELY: ? ? ?Following upper endoscopy (EGD) ? Vomiting of blood or coffee ground material ? New chest pain or pain under the shoulder blades ? Painful or persistently difficult swallowing ? New shortness of breath ? Fever of 100?F or higher ? Black, tarry-looking stools ? ?For urgent or emergent issues, a gastroenterologist can be reached at any hour  by calling [phone]. ?Do not use MyChart messaging for urgent concerns.  ? ? ?DIET:  We do recommend a small meal at first, but then you may proceed to your regular diet.  Drink plenty of fluids but you should avoid alcoholic beverages for 24 hours. ? ?ACTIVITY:  You should plan to take it easy for the rest of today and you should NOT DRIVE or use heavy machinery until tomorrow (because of the sedation medicines used during the test).   ? ?FOLLOW UP: ?Our staff will call the number listed on your records 48-72 hours following your procedure to check on you and address any questions or concerns that you may have regarding the information given to you following your procedure. If we do not reach you, we will leave a message.  We will attempt to reach you two times.  During this call, we will ask if you have developed any symptoms of COVID 19. If you develop any symptoms (ie: fever, flu-like symptoms, shortness of breath, cough etc.) before then, please call [phone].  If you test positive for Covid 19 in the 2 weeks post procedure, please call and report this information to [place].   ? ?If any biopsies were taken you will be contacted by phone or by letter within the next 1-3 weeks.  Please call us at [phone] if you have not heard about the biopsies in 3 weeks.  ? ? ?SIGNATURES/CONFIDENTIALITY: ?You  and/or your care partner have signed paperwork which will be entered into your electronic medical record.  These signatures attest to the fact that that the information above on your After Visit Summary has been reviewed and is understood.  Full responsibility of the confidentiality of this discharge information lies with you and/or your care-partner.  ?

## 2021-09-14 NOTE — Progress Notes (Signed)
VS-AS ? ?Pt's states no medical or surgical changes since previsit or office visit. ? ?

## 2021-09-14 NOTE — Op Note (Signed)
[place] ?Patient Name: Amy Moon ?Procedure Date: 09/14/2021 10:08 AM ?MRN: [id number] ?Endoscopist: Jackquline Denmark , MD ?Age: 71 ?Referring MD:  ?Date of Birth: 07-Nov-1950 ?Gender: Female ?Account #: [id number] ?Procedure:                Upper GI endoscopy ?Indications:              Dysphagia and screening for EV ?Medicines:                Monitored Anesthesia Care ?Procedure:                Pre-Anesthesia Assessment: ?                          - Prior to the procedure, a History and Physical  ?                          was performed, and patient medications and  ?                          allergies were reviewed. The patient's tolerance of  ?                          previous anesthesia was also reviewed. The risks  ?                          and benefits of the procedure and the sedation  ?                          options and risks were discussed with the patient.  ?                          All questions were answered, and informed consent  ?                          was obtained. Prior Anticoagulants: The patient has  ?                          taken Plavix, last dose yesterday as per cardiology  ?                          recommendations-was not taken off Plavix. ASA Grade  ?                          Assessment: III - A patient with severe systemic  ?                          disease. After reviewing the risks and benefits,  ?                          the patient was deemed in satisfactory condition to  ?                          undergo the procedure. ?  After obtaining informed consent, the endoscope was  ?                          passed under direct vision. Throughout the  ?                          procedure, the patient's blood pressure, pulse, and  ?                          oxygen saturations were monitored continuously. The  ?                          GIF [id number] #[id number] was introduced through the  ?                          mouth, and advanced to the second part  of duodenum.  ?                          The upper GI endoscopy was accomplished without  ?                          difficulty. The patient tolerated the procedure  ?                          well. ?Scope In: ?Scope Out: ?Findings:                 The esophagus was mildly torturous. One  ?                          benign-appearing, intrinsic mild  ?                          (non-circumferential scarring) stenosis was found  ?                          35 cm from the incisors with healed distal  ?                          esophageal erosions. This stenosis measured 1.2 cm  ?                          (inner diameter). The stenosis was traversed. The  ?                          scope was withdrawn. Dilation was performed with a  ?                          Maloney dilator with mild resistance at 50 Fr and  ?                          52 Fr. no varices were noted. ?                          A small hiatal hernia was  present. ?                          A single 10 mm sessile polyp with no bleeding and  ?                          no stigmata of recent bleeding was found in the  ?                          gastric antrum. Not biopsied since patient was on  ?                          Plavix ?                          The examined duodenum was normal. ?Complications:            No immediate complications. ?Estimated Blood Loss:     Estimated blood loss: none. ?Impression:               - Benign-appearing esophageal stenosis. Dilated. ?                          - Small hiatal hernia. ?                          - A single gastric polyp. ?                          - No esophageal varices. ?                          - No specimens collected. ?Recommendation:           - Patient has a contact number available for  ?                          emergencies. The signs and symptoms of potential  ?                          delayed complications were discussed with the  ?                          patient. Return to normal activities  tomorrow.  ?                          Written discharge instructions were provided to the  ?                          patient. ?                          - Post dilatation diet. ?                          - Continue present medications including Protonix  ?  40 mg p.o. BID x 4 weeks, then once a day  ?                          indefinitely. ?                          - Resume Plavix from today onwards. ?                          - Return to GI clinic PRN. ?                          - The findings and recommendations were discussed  ?                          with the patient's family. ?Jackquline Denmark, MD ?09/14/2021 10:38:02 AM ?This report has been signed electronically. ?

## 2021-09-15 ENCOUNTER — Ambulatory Visit: Payer: Medicare HMO

## 2021-09-16 ENCOUNTER — Telehealth: Payer: Self-pay | Admitting: *Deleted

## 2021-09-16 NOTE — Telephone Encounter (Signed)
?  Follow up Call- ? ? ?  09/14/2021  ?  9:31 AM 07/16/2019  ? 10:18 AM  ?Call back number  ?Post procedure Call Back phone  # [phone]  ?Permission to leave phone message Yes Yes  ?  ? ?Patient questions: ? ?Do you have a fever, pain , or abdominal swelling? No. ?Pain Score  0  ? ?Have you tolerated food without any problems? Yes.   ? ?Have you been able to return to your normal activities? Yes.   ? ?Do you have any questions about your discharge instructions: ?Diet   No. ?Medications  No. ?Follow up visit  No. ? ?Do you have questions or concerns about your Care? No. ? ?Actions: ?* If pain score is 4 or above: ?No action needed, pain <4. ? ? ?

## 2021-09-16 NOTE — Telephone Encounter (Signed)
?  Follow up Call- ? ? ?  09/14/2021  ?  9:31 AM 07/16/2019  ? 10:18 AM  ?Call back number  ?Post procedure Call Back phone  # [phone]  ?Permission to leave phone message Yes Yes  ?  ? ?Patient questions: ?Message left to call if necessary. ?

## 2021-09-20 ENCOUNTER — Ambulatory Visit: Payer: Medicare HMO

## 2021-09-20 ENCOUNTER — Ambulatory Visit
Admission: RE | Admit: 2021-09-20 | Discharge: 2021-09-20 | Disposition: A | Discharge status: Home / Self Care | Payer: Medicare HMO | Source: Ambulatory Visit | Attending: Physician Assistant | Admitting: Physician Assistant

## 2021-09-20 DIAGNOSIS — Z1231 Encounter for screening mammogram for malignant neoplasm of breast: Secondary | ICD-10-CM

## 2021-09-22 ENCOUNTER — Other Ambulatory Visit: Payer: Self-pay | Admitting: Physician Assistant

## 2021-09-22 DIAGNOSIS — R928 Other abnormal and inconclusive findings on diagnostic imaging of breast: Secondary | ICD-10-CM

## 2021-09-30 ENCOUNTER — Ambulatory Visit: Payer: Medicare HMO | Admitting: Cardiology

## 2021-10-07 ENCOUNTER — Ambulatory Visit
Admission: RE | Admit: 2021-10-07 | Discharge: 2021-10-07 | Disposition: A | Discharge status: Home / Self Care | Payer: Medicare HMO | Source: Ambulatory Visit | Attending: Physician Assistant | Admitting: Physician Assistant

## 2021-10-07 ENCOUNTER — Other Ambulatory Visit: Payer: Self-pay | Admitting: Physician Assistant

## 2021-10-07 DIAGNOSIS — N6311 Unspecified lump in the right breast, upper outer quadrant: Secondary | ICD-10-CM | POA: Diagnosis not present

## 2021-10-07 DIAGNOSIS — R928 Other abnormal and inconclusive findings on diagnostic imaging of breast: Secondary | ICD-10-CM

## 2021-10-07 DIAGNOSIS — N6310 Unspecified lump in the right breast, unspecified quadrant: Secondary | ICD-10-CM

## 2021-10-12 ENCOUNTER — Encounter: Payer: Self-pay | Admitting: Physician Assistant

## 2021-10-12 ENCOUNTER — Ambulatory Visit (INDEPENDENT_AMBULATORY_CARE_PROVIDER_SITE_OTHER): Payer: Medicare HMO | Admitting: Physician Assistant

## 2021-10-12 VITALS — BP 116/71 | HR 94 | Temp 98.2°F | Ht 60.0 in | Wt 143.0 lb

## 2021-10-12 DIAGNOSIS — R5383 Other fatigue: Secondary | ICD-10-CM

## 2021-10-12 DIAGNOSIS — R634 Abnormal weight loss: Secondary | ICD-10-CM

## 2021-10-12 DIAGNOSIS — M436 Torticollis: Secondary | ICD-10-CM

## 2021-10-12 DIAGNOSIS — R509 Fever, unspecified: Secondary | ICD-10-CM | POA: Diagnosis not present

## 2021-10-12 DIAGNOSIS — R946 Abnormal results of thyroid function studies: Secondary | ICD-10-CM | POA: Diagnosis not present

## 2021-10-12 NOTE — Progress Notes (Signed)
? ?Established Patient Office Visit ? ?Subjective   ?Patient ID: Amy Moon, female    DOB: May 30, 1951  Age: 71 y.o. MRN: [id number] ? ?Chief Complaint  ?Patient presents with  ? Follow-up  ? ? ?HPI ?Patient presents with continued symptoms of fever, neck stiffness and tightness.  Fever has been ongoing for about 4 months.  Takes Tylenol 1000 mg every 4-6 hours. Patient presented in August 12 2021 with new symptoms of neck stiffness and headache,with intermittent fever since December 2022. Patient was referred to neurology for further evaluation including r/o type of meningitis.  Patient has not seen neurology.  In the past has seen neurology but does not want to see the same provider.  Patient reports has been feeling more tired than usual and noticed losing weight, making an effort to not skip meals and have more than one meal per day. ? ? ?Patient Active Problem List  ? Diagnosis Date Noted  ? Cellulitis of left lower extremity 03/15/2021  ? Atopic dermatitis 03/15/2021  ? Encounter for screening for COVID-19 03/15/2021  ? Status post left knee replacement 01/22/2021  ? Status post total left knee replacement 01/21/2021  ? Unilateral primary osteoarthritis, left knee 11/30/2020  ? Dyslipidemia 04/02/2020  ? Splenomegaly   ? Portal hypertension (HCC)   ? Pneumonia   ? Osteoporosis   ? Osteopenia   ? Neuropathy   ? Low vitamin D level   ? History of kidney stones   ? Hiatal hernia   ? Hepatic disease   ? Granulomatous disease ([place])   ? GERD (gastroesophageal reflux disease)   ? Enlarged thyroid   ? Duodenal ulcer   ? Drug-induced low platelet count   ? Depression   ? Clotting disorder ([place])   ? Cirrhosis ([place])   ? Chronic kidney disease   ? Cataract   ? Cancer [facility])   ? Asthma   ? Arthritis   ? Angina pectoris ([place])   ? Anemia   ? Allergy   ? Stage 3 chronic kidney disease ([place]) 10/21/2019  ? History of smoking 25-50 pack years- quit age 5, smoked 2ppd from 71 yo-71 yo. 10/21/2019  ? History of surgery- pt has  several Sx- no personal issues wiht anesthesia  10/21/2019  ? Sleep disturbance 10/21/2019  ? History of exposure to asbestos 10/21/2019  ? Family history of adverse reaction to anesthesia   ? Dyspnea on exertion 10/17/2019  ? Encounter for Medicare annual wellness exam 05/09/2018  ? Vitamin D deficiency 11/05/2017  ? Encounter for hepatitis C virus screening test for high risk patient 11/05/2017  ? Dysphagia 09/03/2017  ? Thrombocytopenia ([place]) 09/03/2017  ? Healthcare maintenance 07/30/2017  ? Chronic pain syndrome 07/30/2017  ? Chest pain 07/30/2017  ? Parkinson's disease ([place]) 07/30/2017  ? Hypertension 07/30/2017  ? Severe episode of recurrent major depressive disorder, without psychotic features ([place]) 05/15/2016  ? Tremor of both hands 05/15/2016  ? Severe obesity (BMI >= 40) ([place]) 05/15/2016  ? Nausea 03/01/2016  ? Irritant contact dermatitis 08/25/2015  ? ?Past Medical History:  ?Diagnosis Date  ? Allergy   ? Anemia   ? Angina pectoris ([place])   ? Arthritis   ? Asthma   ? years ago  ? Bronchitis 06/03/2018  ? Cancer [facility])   ? squamous cell carcinoma, nose  ? Cataract   ? Chest pain 07/30/2017  ? Chronic kidney disease   ? possibly per pt  ? Chronic pain syndrome 07/30/2017  ? Cirrhosis ([place])   ?  Clotting disorder ([place])   ? platelets are low  ? Coronary artery disease 04/02/2020  ? Depression   ? Drug-induced low platelet count   ? Duodenal ulcer   ? Dyslipidemia 04/02/2020  ? Dysphagia 09/03/2017  ? Dyspnea on exertion 10/17/2019  ? Encounter for hepatitis C virus screening test for high risk patient 11/05/2017  ? Encounter for Medicare annual wellness exam 05/09/2018  ? Enlarged thyroid   ? Family history of adverse reaction to anesthesia   ? father had difficulty waking up and breathing on his own after surgery  ? GERD (gastroesophageal reflux disease)   ? Granulomatous disease ([place])   ? Healthcare maintenance 07/30/2017  ? Hepatic disease   ? Hiatal hernia   ? History of exposure to asbestos 10/21/2019  ?  History of kidney stones   ? History of smoking 25-50 pack years- quit age 45, smoked 2ppd from 71 yo-71 yo. 10/21/2019  ? History of surgery- pt has several Sx- no personal issues wiht anesthesia  10/21/2019  ? Hypertension   ? Irritant contact dermatitis 08/25/2015  ? Low vitamin D level   ? Migraine   ? Nausea 03/01/2016  ? Neuropathy   ? Osteopenia   ? Osteoporosis   ? Parkinson's disease ([place])   ? Peripheral vascular disease ([place])   ? Pneumonia   ? PONV (postoperative nausea and vomiting)   ? Portal hypertension (HCC)   ? Severe episode of recurrent major depressive disorder, without psychotic features ([place]) 05/15/2016  ? Severe obesity (BMI >= 40) ([place]) 05/15/2016  ? Sleep disturbance 10/21/2019  ? Splenomegaly a  ? Stage 3 chronic kidney disease ([place]) 10/21/2019  ? Thrombocytopenia ([place]) 09/03/2017  ? Tremor of both hands 05/15/2016  ? Vitamin D deficiency 11/05/2017  ? ?Past Surgical History:  ?Procedure Laterality Date  ? ABDOMINAL HYSTERECTOMY    ? APPENDECTOMY    ? BACK SURGERY    ? thoracic-fractured   ? BACK SURGERY    ? lumbar - fractured  ? BREAST LUMPECTOMY WITH RADIOACTIVE SEED LOCALIZATION Right 08/01/2018  ? Procedure: RADIOACTIVE SEED GUIDED RIGHT BREAST LUMPECTOMY;  Surgeon: Coralie Keens, MD;  Location: [facility];  Service: General;  Laterality: Right;  ? COLONOSCOPY    ? ESOPHAGOGASTRODUODENOSCOPY  05/20/2008  ? Mild to moderate esophagitis. Otherwise normal EGD.   ? EYE SURGERY Left   ? cataract surgery with lens implant  ? GASTROSTOMY    ? peptic ulcer - peg tube placement  ? TOTAL KNEE ARTHROPLASTY Left 01/21/2021  ? Procedure: LEFT TOTAL KNEE ARTHROPLASTY;  Surgeon: Mcarthur Rossetti, MD;  Location: [facility];  Service: Orthopedics;  Laterality: Left;  Need RNFA  ? UPPER GASTROINTESTINAL ENDOSCOPY    ? ?Social History  ? ?Tobacco Use  ? Smoking status: Former  ?  Packs/day: 1.00  ?  Years: 22.00  ?  Pack years: 22.00  ?  Types: Cigarettes  ?  Quit date: 06/26/1986  ?  Years since  quitting: 35.3  ? Smokeless tobacco: Never  ?Vaping Use  ? Vaping Use: Never used  ?Substance Use Topics  ? Alcohol use: No  ? Drug use: No  ? ?Allergies  ?Allergen Reactions  ? Iodinated Contrast Media Hives  ? Nsaids Other (See Comments)  ?  History of bleeding ulcers  ? Latex Hives and Rash  ? Sinemet [Carbidopa W-Levodopa] Nausea And Vomiting  ? Inderal [Propranolol] Other (See Comments)  ?  Unknown  ? Indomethacin Other (See Comments)  ?  Unknown  ?  Penicillin G Other (See Comments)  ?  Unknown  ? Pneumococcal Vaccines Other (See Comments)  ?  Caused "pneumonia"  ? Scopolamine Other (See Comments)  ?  The patch, caused her to pass out / change in mental status  ? Tape Other (See Comments)  ?  Unknown  ? ? ?ROS ?Review of Systems:  ?A fourteen system review of systems was performed and found to be positive as per HPI. ? ?  ?Objective:  ?  ? ?BP 116/71   Pulse 94   Temp 98.2 ?F (36.8 ?C)   Ht 5' (1.524 m)   Wt 143 lb (64.9 kg)   BMI 27.93 kg/m?  ?BP Readings from Last 3 Encounters:  ?10/12/21 116/71  ?09/14/21 126/64  ?09/02/21 (!) 150/70  ? ?Wt Readings from Last 3 Encounters:  ?10/12/21 143 lb (64.9 kg)  ?09/14/21 144 lb (65.3 kg)  ?09/02/21 144 lb 8 oz (65.5 kg)  ? ? ?Physical Exam ?General:  Well Developed, well nourished, appropriate for stated age.  ?Neuro:  Alert and oriented,  extra-ocular muscles intact  ?HEENT:  Normocephalic, atraumatic, cerumen impaction of right ear, normal TM of left ear, +cervical adenopathy (submandibular glands) ?MSK: limited cervical ROM with rotational movement, +asymmetry ?Skin:  no gross rash, warm, pink. ?Cardiac:  RRR, S1 S2 ?Respiratory: CTA B/L  ?Vascular:  Ext warm, no cyanosis apprec.; cap RF less 2 sec. ?Psych:  No HI/SI, judgement and insight good, Euthymic mood. Full Affect. ? ? ?No results found for any visits on 10/12/21. ? ?Last CBC ?Lab Results  ?Component Value Date  ? WBC 3.9 08/31/2021  ? HGB 12.4 08/31/2021  ? HCT 36.1 08/31/2021  ? MCV 93 08/31/2021  ?  MCH 32.0 08/31/2021  ? RDW 11.9 08/31/2021  ? PLT 156 08/31/2021  ? ?Last metabolic panel ?Lab Results  ?Component Value Date  ? GLUCOSE 108 (H) 08/31/2021  ? NA 145 (H) 08/31/2021  ? K 3.5 08/31/2021  ? CL 106 03

## 2021-10-12 NOTE — Patient Instructions (Signed)
Spinal Stenosis ? ?Spinal stenosis happens when the spinal canal gets smaller. The spinal canal is the space between the bones of your spine (vertebrae). As the canal gets smaller, the nerves that pass that part of the spine are pressed. This causes pain. Spinal stenosis can affect your neck, upper back, or lower back. ?What are the causes? ?This condition is caused by parts of bone that push into your spinal canal. This problem may start before birth. If it occurs after birth, the cause may be: ?Breakdown of bones of your spine. This normally starts around 73 years of age. ?Injury to your spine. ?Tumors in your spine. ?A buildup of calcium in your spine. ?What increases the risk? ?You are more likely to develop this condition if: ?You are older than age 25. ?You were born with a problem in your spine, such as a curved spine (scoliosis). ?You have arthritis. This is disease of your joints. ?What are the signs or symptoms? ?Common symptoms of this condition include: ?Pain in your neck or back. The pain may be worse when you stand or walk. ?Problems with your legs. A leg may lose feeling, tingle, or turn hot or cold. ?Pain that goes from your butt down to your lower leg (sciatica). ?Falling a lot. ?Slapping your foot down when you walk. This weakens muscles. ?Severe symptoms of this condition include: ?Problems pooping or peeing. ?Trouble having sex. ?Loss of feeling in your leg. ?Being unable to walk. ?Sometimes there are no symptoms. ?How is this treated? ?To treat pain and manage symptoms, you may be asked to: ?Practice sitting and standing up straight. This is good posture. ?Exercise. ?Lose weight, if needed. ?Take medicines or get shots. ?Support your back with a corset or a brace. ?In some cases, you may need to have surgery. ?Follow these instructions at home: ?Managing pain, stiffness, and swelling ? ?Stand and sit up straight. If you have a brace or a corset, wear it as told. ?Keep a healthy weight. Talk with  your doctor if you need help losing weight. ?If told, put heat on the affected area. Do this as often as told by your doctor. Use the heat source that your doctor recommends, such as a moist heat pack or a heating pad. ?Place a towel between your skin and the heat source. ?Leave the heat on for 20-30 minutes. ?Take off the heat if your skin turns bright red. This is very important if you are unable to feel pain, heat, or cold. You have a greater risk of getting burned. ?Activity ?Do exercises as told by your doctor. ?Do not do activities that cause pain. Ask your doctor what activities are safe for you. ?Do not lift anything that is heavier than 10 lb (4.5 kg), or the limit that you are told by your doctor. ?Return to your normal activities as told by your doctor. Ask your doctor what activities are safe for you. ?General instructions ?Take over-the-counter and prescription medicines only as told by your doctor. ?Do not use any products that contain nicotine or tobacco, such as cigarettes, e-cigarettes, and chewing tobacco. If you need help quitting, ask your doctor. ?Eat a healthy diet. Eat a lot of: ?Fruits. ?Vegetables. ?Whole grains. ?Low-fat (lean) protein. ?Keep all follow-up visits as told by your doctor. This is important. ?Contact a doctor if: ?Your symptoms do not get better. ?Your symptoms get worse. ?You have a fever. ?Get help right away if: ?You have new pain or very bad pain in your neck  or upper back. ?You have very bad pain, and medicine does not help. ?You have a very bad headache. ?You are dizzy. ?You do not see well. ?You vomit or feel like you may vomit. ?You have these things in your back or legs: ?New or worse loss of feeling. ?New or worse tingling. ?Your arm or leg: ?Hurts or swells. ?Turns red. ?Feels warm. ?Summary ?Spinal stenosis happens when the space between the bones of the spine gets smaller. The small space puts pressure on the nerves in your spine. ?This condition may start before  birth. Breakdown of bones, injuries, or tumors can cause this condition after birth. ?Spinal stenosis can cause pain in the neck, back, legs, or butt. A leg may lose feeling, tingle, or turn hot or cold. ?Treatment for this condition focuses on lessening your pain and other symptoms. ?This information is not intended to replace advice given to you by your health care provider. Make sure you discuss any questions you have with your health care provider. ?Document Revised: 07/13/2021 Document Reviewed: 04/10/2019 ?Elsevier Patient Education ? [place]. ? ?

## 2021-10-13 ENCOUNTER — Telehealth: Payer: Self-pay | Admitting: Physician Assistant

## 2021-10-13 LAB — COMPREHENSIVE METABOLIC PANEL
ALT: 9 IU/L (ref 0–32)
AST: 17 IU/L (ref 0–40)
Albumin/Globulin Ratio: 2.1 (ref 1.2–2.2)
Albumin: 4.6 g/dL (ref 3.7–4.7)
Alkaline Phosphatase: 92 IU/L (ref 44–121)
BUN/Creatinine Ratio: 17 (ref 12–28)
BUN: 10 mg/dL (ref 8–27)
Bilirubin Total: 0.7 mg/dL (ref 0.0–1.2)
CO2: 26 mmol/L (ref 20–29)
Calcium: 9.4 mg/dL (ref 8.7–10.3)
Chloride: 103 mmol/L (ref 96–106)
Creatinine, Ser: 0.59 mg/dL (ref 0.57–1.00)
Globulin, Total: 2.2 g/dL (ref 1.5–4.5)
Glucose: 104 mg/dL — ABNORMAL HIGH (ref 70–99)
Potassium: 3.7 mmol/L (ref 3.5–5.2)
Sodium: 142 mmol/L (ref 134–144)
Total Protein: 6.8 g/dL (ref 6.0–8.5)
eGFR: 95 mL/min/{1.73_m2} (ref >59)

## 2021-10-13 LAB — LYME DISEASE SEROLOGY W/REFLEX: Lyme Total Antibody EIA: NEGATIVE

## 2021-10-13 NOTE — Telephone Encounter (Signed)
Amy Moon with [facility] called about the order for this patient and they said she is unable to do it at their location because patient has a very strong reaction to the contrast and she would need to have it done at the hospital.  ?

## 2021-10-13 NOTE — Telephone Encounter (Signed)
Order changed and sent to [facility] hospital imaging department. AS, CMA ?

## 2021-10-14 ENCOUNTER — Ambulatory Visit
Admission: RE | Admit: 2021-10-14 | Discharge: 2021-10-14 | Disposition: A | Discharge status: Home / Self Care | Payer: Medicare HMO | Source: Ambulatory Visit | Attending: Physician Assistant | Admitting: Physician Assistant

## 2021-10-14 DIAGNOSIS — N6310 Unspecified lump in the right breast, unspecified quadrant: Secondary | ICD-10-CM

## 2021-10-14 DIAGNOSIS — N6311 Unspecified lump in the right breast, upper outer quadrant: Secondary | ICD-10-CM | POA: Diagnosis not present

## 2021-10-14 LAB — T4, FREE: Free T4: 1.17 ng/dL (ref 0.82–1.77)

## 2021-10-14 LAB — SPECIMEN STATUS REPORT

## 2021-10-14 LAB — T3: T3, Total: 137 ng/dL (ref 71–180)

## 2021-10-17 ENCOUNTER — Other Ambulatory Visit: Payer: Self-pay | Admitting: Cardiology

## 2021-10-18 NOTE — Telephone Encounter (Signed)
Rx refill sent to pharmacy. 

## 2021-10-19 LAB — CBC WITH DIFFERENTIAL/PLATELET
Basophils Absolute: 0 10*3/uL (ref 0.0–0.2)
Basos: 1 %
EOS (ABSOLUTE): 0.1 10*3/uL (ref 0.0–0.4)
Eos: 3 %
Hematocrit: 36.3 % (ref 34.0–46.6)
Hemoglobin: 13.4 g/dL (ref 11.1–15.9)
Immature Grans (Abs): 0 10*3/uL (ref 0.0–0.1)
Immature Granulocytes: 0 %
Lymphocytes Absolute: 1.5 10*3/uL (ref 0.7–3.1)
Lymphs: 29 %
MCH: 34.6 pg — ABNORMAL HIGH (ref 26.6–33.0)
MCHC: 36.9 g/dL — ABNORMAL HIGH (ref 31.5–35.7)
MCV: 94 fL (ref 79–97)
Monocytes Absolute: 0.3 10*3/uL (ref 0.1–0.9)
Monocytes: 6 %
Neutrophils Absolute: 3.3 10*3/uL (ref 1.4–7.0)
Neutrophils: 61 %
Platelets: 176 10*3/uL (ref 150–450)
RBC: 3.87 x10E6/uL (ref 3.77–5.28)
RDW: 12.3 % (ref 11.7–15.4)
WBC: 5.3 10*3/uL (ref 3.4–10.8)

## 2021-10-19 LAB — QUANTIFERON-TB GOLD PLUS
QuantiFERON Mitogen Value: >10 IU/mL
QuantiFERON Nil Value: 0.06 IU/mL
QuantiFERON TB1 Ag Value: 0.15 IU/mL
QuantiFERON TB2 Ag Value: 0.17 IU/mL
QuantiFERON-TB Gold Plus: NEGATIVE

## 2021-10-19 LAB — HEPATITIS B SURFACE ANTIGEN: Hepatitis B Surface Ag: NEGATIVE

## 2021-10-19 LAB — HEPATITIS C ANTIBODY: Hep C Virus Ab: NONREACTIVE

## 2021-10-19 LAB — HIV ANTIBODY (ROUTINE TESTING W REFLEX): HIV Screen 4th Generation wRfx: NONREACTIVE

## 2021-10-19 LAB — TSH: TSH: 0.364 u[IU]/mL — ABNORMAL LOW (ref 0.450–4.500)

## 2021-10-19 LAB — VITAMIN D 25 HYDROXY (VIT D DEFICIENCY, FRACTURES): Vit D, 25-Hydroxy: 19.7 ng/mL — ABNORMAL LOW (ref 30.0–100.0)

## 2021-10-20 ENCOUNTER — Other Ambulatory Visit: Payer: Self-pay

## 2021-10-20 DIAGNOSIS — E559 Vitamin D deficiency, unspecified: Secondary | ICD-10-CM

## 2021-10-20 DIAGNOSIS — E0590 Thyrotoxicosis, unspecified without thyrotoxic crisis or storm: Secondary | ICD-10-CM

## 2021-10-20 MED ORDER — VITAMIN D (ERGOCALCIFEROL) 1.25 MG (50000 UNIT) PO CAPS: 50000.0000 [IU] | ORAL_CAPSULE | ORAL | 0 refills | Status: DC

## 2021-11-02 ENCOUNTER — Other Ambulatory Visit: Payer: Self-pay | Admitting: Cardiology

## 2021-11-02 NOTE — Telephone Encounter (Signed)
Rx refill sent to pharmacy. 

## 2021-11-15 ENCOUNTER — Telehealth: Payer: Self-pay

## 2021-11-15 ENCOUNTER — Other Ambulatory Visit (INDEPENDENT_AMBULATORY_CARE_PROVIDER_SITE_OTHER): Payer: Medicare HMO

## 2021-11-15 ENCOUNTER — Ambulatory Visit: Payer: Medicare HMO | Admitting: Gastroenterology

## 2021-11-15 VITALS — BP 140/80 | HR 100 | Ht 60.0 in | Wt 146.5 lb

## 2021-11-15 DIAGNOSIS — K7460 Unspecified cirrhosis of liver: Secondary | ICD-10-CM

## 2021-11-15 DIAGNOSIS — R112 Nausea with vomiting, unspecified: Secondary | ICD-10-CM

## 2021-11-15 DIAGNOSIS — K5909 Other constipation: Secondary | ICD-10-CM

## 2021-11-15 DIAGNOSIS — K8020 Calculus of gallbladder without cholecystitis without obstruction: Secondary | ICD-10-CM

## 2021-11-15 DIAGNOSIS — R1310 Dysphagia, unspecified: Secondary | ICD-10-CM

## 2021-11-15 DIAGNOSIS — R1011 Right upper quadrant pain: Secondary | ICD-10-CM

## 2021-11-15 DIAGNOSIS — K219 Gastro-esophageal reflux disease without esophagitis: Secondary | ICD-10-CM

## 2021-11-15 LAB — CBC WITH DIFFERENTIAL/PLATELET
Basophils Absolute: 0 10*3/uL (ref 0.0–0.1)
Basophils Relative: 0.9 % (ref 0.0–3.0)
Eosinophils Absolute: 0.1 10*3/uL (ref 0.0–0.7)
Eosinophils Relative: 1.3 % (ref 0.0–5.0)
HCT: 36.4 % (ref 36.0–46.0)
Hemoglobin: 12.3 g/dL (ref 12.0–15.0)
Lymphocytes Relative: 25.5 % (ref 12.0–46.0)
Lymphs Abs: 1.2 10*3/uL (ref 0.7–4.0)
MCHC: 33.9 g/dL (ref 30.0–36.0)
MCV: 97.3 fl (ref 78.0–100.0)
Monocytes Absolute: 0.3 10*3/uL (ref 0.1–1.0)
Monocytes Relative: 5.8 % (ref 3.0–12.0)
Neutro Abs: 3 10*3/uL (ref 1.4–7.7)
Neutrophils Relative %: 66.5 % (ref 43.0–77.0)
Platelets: 143 10*3/uL — ABNORMAL LOW (ref 150.0–400.0)
RBC: 3.74 Mil/uL — ABNORMAL LOW (ref 3.87–5.11)
RDW: 12.9 % (ref 11.5–15.5)
WBC: 4.6 10*3/uL (ref 4.0–10.5)

## 2021-11-15 LAB — COMPREHENSIVE METABOLIC PANEL
ALT: 12 U/L (ref 0–35)
AST: 14 U/L (ref 0–37)
Albumin: 4.4 g/dL (ref 3.5–5.2)
Alkaline Phosphatase: 86 U/L (ref 39–117)
BUN: 10 mg/dL (ref 6–23)
CO2: 30 mEq/L (ref 19–32)
Calcium: 9.5 mg/dL (ref 8.4–10.5)
Chloride: 103 mEq/L (ref 96–112)
Creatinine, Ser: 0.54 mg/dL (ref 0.40–1.20)
GFR: 91.37 mL/min (ref >60.00)
Glucose, Bld: 103 mg/dL — ABNORMAL HIGH (ref 70–99)
Potassium: 3.5 mEq/L (ref 3.5–5.1)
Sodium: 139 mEq/L (ref 135–145)
Total Bilirubin: 0.9 mg/dL (ref 0.2–1.2)
Total Protein: 7.2 g/dL (ref 6.0–8.3)

## 2021-11-15 LAB — URINALYSIS WITH CULTURE, IF INDICATED
Bilirubin Urine: NEGATIVE
Hgb urine dipstick: NEGATIVE
Ketones, ur: NEGATIVE
Leukocytes,Ua: NEGATIVE
Nitrite: NEGATIVE
Specific Gravity, Urine: 1.02 (ref 1.000–1.030)
Total Protein, Urine: NEGATIVE
Urine Glucose: NEGATIVE
Urobilinogen, UA: 0.2 (ref 0.0–1.0)
pH: 6 (ref 5.0–8.0)

## 2021-11-15 LAB — PROTIME-INR
INR: 1 ratio (ref 0.8–1.0)
Prothrombin Time: 11.5 s (ref 9.6–13.1)

## 2021-11-15 LAB — AMMONIA: Ammonia: 28 umol/L (ref 11–35)

## 2021-11-15 MED ORDER — FLUTICASONE PROPIONATE 0.05 % EX CREA: TOPICAL_CREAM | Freq: Two times a day (BID) | CUTANEOUS | 1 refills | Status: DC

## 2021-11-15 MED ORDER — DIPHENHYDRAMINE HCL 25 MG PO TABS: 25.0000 mg | ORAL_TABLET | ORAL | 0 refills | Status: DC

## 2021-11-15 MED ORDER — PREDNISONE 50 MG PO TABS: 50.0000 mg | ORAL_TABLET | ORAL | 0 refills | Status: DC

## 2021-11-15 MED ORDER — PROMETHAZINE HCL 25 MG PO TABS: 25.0000 mg | ORAL_TABLET | Freq: Four times a day (QID) | ORAL | 2 refills | Status: DC | PRN

## 2021-11-15 NOTE — Patient Instructions (Signed)
If you are age 73 or older, your body mass index should be between 23-30. Your Body mass index is 28.61 kg/m. If this is out of the aforementioned range listed, please consider follow up with your Primary Care Provider.  If you are age 39 or younger, your body mass index should be between 19-25. Your Body mass index is 28.61 kg/m. If this is out of the aformentioned range listed, please consider follow up with your Primary Care Provider.   ________________________________________________________  The [facility] providers would like to encourage you to use [facility] to communicate with providers for non-urgent requests or questions.  Due to long hold times on the telephone, sending your provider a message by [facility] may be a faster and more efficient way to get a response.  Please allow 48 business hours for a response.  Please remember that this is for non-urgent requests.  _______________________________________________________  Your provider has requested that you go to the basement level for lab work before leaving today. Press "B" on the elevator. The lab is located at the first door on the left as you exit the elevator.  Continue Protonix and dulcolax  We have sent the following medications to your pharmacy for you to pick up at your convenience: Phenergan  Please call in  3 months to schedule an office visit.  Minimize pain medications  Please pretreat before CT by doing  Prednisone 8m 1 tablet 13 hours before CT then 7 hours before CT and then 1 hour before CT along with Benadryl 293mtablet 1 hour before CT. Script has been sent for this  You have been scheduled for a CT [facility] of [facility] and pelvis [facility] on [facility]/2021 at [place] 15 minutes prior to your appointment time for registration. Please follow the written instructions below on the day of your exam:  WARNING: IF YOU ARE ALLERGIC TO IODINE/X-RAY DYE, PLEASE NOTIFY  RADIOLOGY IMMEDIATELY AT [phone] WILL BE GIVEN A 13 HOUR PREMEDICATION PREP.  1) Do not eat or drink anything after 6am (4 hours prior to your test) 2) You have been given 2 bottles of oral contrast to drink. The solution may taste better if refrigerated, but do NOT add ice or any other liquid to this solution. Shake well before drinking.    Drink 1 bottle of contrast @ 8am (2 hours prior to your exam)  Drink 1 bottle of contrast @ 9am  (1 hour prior to your exam)  You may take any medications as prescribed with a small amount of water, if necessary. If you take any of the following medications: METFORMIN, GLUCOPHAGE, GLUCOVANCE, AVANDAMET, RIOMET, FORTAMET, ACRaifordET, JANUMET, GLUMETZA or METAGLIP, you MAY be asked to HOLD this medication 48 hours AFTER the exam.  The purpose of you drinking the oral contrast is to aid in the visualization of your intestinal tract. The contrast solution may cause some diarrhea. Depending on your individual set of symptoms, you may also receive an intravenous injection of x-ray contrast/dye. [facility], depending on the type of exam you are having performed.  This test typically takes 30-45 minutes to complete.  If you have any questions regarding your exam or if you need to reschedule, you may call the CT department at [phone] the hours of 8:00 am and 5:00 pm, Monday-Friday.  ________________________________________________________________________

## 2021-11-15 NOTE — Telephone Encounter (Signed)
LVM to the patient that Fluticasone cream sent to her pharmacy to treat her rectal bleeding and  She can take Sitz baths two times daily for 7 days.

## 2021-11-15 NOTE — Progress Notes (Signed)
IMPRESSION and PLAN:    #1.  Chronic nausea with RUQ pain. Failed zofran. Better with phenergan. US showed gallstone 08/2019, neg HIDA for acute cholecystitis. Neg EGD. Off narcotics. R/o gastroparesis or functional component.  #2. GERD with dysphagia (resolved after dil 08/2021) d/t distal eso stricture, 2 cm HH. Neg Bx for EoE. H/O food impaction 2003 s/p endoscopic disimpaction. Has assoc oropharyngeal dysphagia d/t ?Parkinson's.  Had PEJ 2012 (removed 2017).   #3. Colorectal cancer screening: Colonoscopy 06/2019: 6 mm TA s/p polypectomy, mild sig div, highly redundant colon. Rpt colon only if any problems.  #4. 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 Likely d/t NASH. No ETOH, neg Hep C. No varices on EGD 06/2019 or ascites on [place] 04/2021. CT AP July 2021 neg for any masses. No HE. Nl AFP  #6. Chronic constipation (likely OIC). Failed miralax, colace. Better with 3 dulcolax/day. Neg colon 06/2019. Occ rectal bleeding d/t hoids.  #7. Asymptomatic cholelithiasis. Neg HIDA 09/2019 for acute cholecystitis.   Plan: -Continue Protonix '40mg'$  po BID  -Phenergan '25mg'$  po QID prn #45 2RF. Sedation and fall precautions were discussed. -CT AP after contrast de-senstization in [facility] -CBC, CMP, AFP, PT INR, NH3 -fluticasone cream 0.05% generic 30g 1 bid PR x 10 days, 2 refills -Sitz baths BID x 7 days -If still with problems, proceed with solid-phase GES. -Continue dulcolax. -Minimize pain medications. -UA -FU in 3 months.    Prednisone protocol for IV contrast desensitization: Give prednisone 50 mg p.o. 13, 7 and 1 hour before the procedure and Benadryl 25 mg p.o. 1 hour before the procedure.   HPI:    Chief Complaint:   Amy Moon is a 71 y.o. female  With CAD on Plavix, HTN, cellulitis along L knee replacement scar (recently treated with doxycycline), insomnia, unexplained fever (being  considered for ID ref)  Nl CBC, CMP 4/21.  Nausea in AM. No Heartburn.  No vomiting on Phenergan.  She overall has a good appetite.  No weight loss.  No further dysphagia after dilation 08/2021  She does have a history of chronic constipation.  Bms 1/3-4 days.  No melena or hematochezia.  She is currently off narcotics.  Dulcolax does help.  Underwent L knee arthroplasty 01/21/2021. Hb 11 to 8.6 s/p 2U   No jaundice dark urine or pale stools.  No abdominal distention.  Denies having any history of confusion or memory lapses.  No easy bruisability.  She does have occasional rectal bleeding attributed to hemorrhoids.   Nl CBC (except plt 149), CMP (alb 4.3), PT INR, AFP Nov 2022  Wt Readings from Last 3 Encounters:  11/15/21 146 lb 8 oz (66.5 kg)  10/12/21 143 lb (64.9 kg)  09/14/21 144 lb (65.3 kg)     From previous records: Underwent RUQ [place] which showed gallstone.  She had + [place] Murphy's sign.Thereafter, underwent HIDA scan which was neg for acute cholecystitis.  Most recently underwent CT Abdo/pelvis 12/25/2019 which was neg for any acute abn.  Wt Readings from Last 3 Encounters:  11/15/21 146 lb 8 oz (66.5 kg)  10/12/21 143 lb (64.9 kg)  09/14/21 144 lb (65.3 kg)     Past GI procedures:  EGD 09/14/2021 - Benign-appearing esophageal stenosis. Dilated. - Small hiatal hernia. - A single gastric polyp. - No esophageal varices. - No specimens collected.  EGD: 07/16/2019 distal esophageal stricture s/p dilatation, grade A esophagitis,  small hiatal hernia.  Colonoscopy Jan 2021:  6 mm colonic polyp s/p polypectomy, mild sig div. Otherwise Nl to TI.  Bx- tubular adenoma.  Rpt not indicated d/t age.  CT AP with contrast 12/25/2019 1. No acute findings within the abdomen or pelvis. 2. Hepatic cirrhosis and findings of portal venous hypertension. No evidence of hepatic neoplasm.  No ascites.  Mild splenomegaly. 3. Cholelithiasis. No radiographic evidence of cholecystitis.  [place]  04/2021 1. Cholelithiasis with no findings of acute cholecystitis. 2. Hepatic steatosis. Please note limited evaluation for focal hepatic masses in a patient with hepatic steatosis due to decreased penetration of the acoustic ultrasound waves. 3. Nonvisualized mid aorta.   [place] 08/2019 1. There is a 1.4 cm stone in the gallbladder. A positive Murphy's sign is reported. However, there is no wall thickening or pericholecystic fluid. Recommend clinical correlation. If there is concern for cholecystitis, recommend HIDA scan.  HIDA: neg 2. Probable hepatic steatosis. 3. No other abnormalities.   Past Medical History:  Diagnosis Date   Angina pectoris (HCC)    Chronic kidney disease    Drug-induced low platelet count    Duodenal ulcer    Granulomatous disease ([place])    Hepatic disease    Hypertension    Low vitamin D level    Neuropathy    Osteopenia    Parkinson's disease (HCC)        Splenomegaly a    Current Outpatient Medications  Medication Sig Dispense Refill   acetaminophen (TYLENOL) 325 MG tablet Take 650 mg by mouth every 6 (six) hours as needed.     atorvastatin (LIPITOR) 10 MG tablet TAKE 1 TABLET (10 MG TOTAL) BY MOUTH DAILY. 30 tablet 0   bisacodyl (DULCOLAX) 5 MG EC tablet Take 15 mg by mouth at bedtime as needed for moderate constipation.     clopidogrel (PLAVIX) 75 MG tablet Take 1 tablet (75 mg total) by mouth daily. 90 tablet 3   cyclobenzaprine (FLEXERIL) 10 MG tablet TAKE 1 TABLET BY MOUTH EVERY 8 HOURS AS NEEDED FOR MUSCLE SPASMS. 60 tablet 1   diphenhydrAMINE (BENADRYL) 25 MG tablet Take 25 mg by mouth every 6 (six) hours as needed for allergies.     furosemide (LASIX) 40 MG tablet TAKE 1 TABLET (40 MG TOTAL) BY MOUTH DAILY. 30 tablet 0   hydrocortisone (ANUSOL-HC) 2.5 % rectal cream Place 1 application. rectally 2 (two) times daily. For 10 days 30 g 1   losartan (COZAAR) 50 MG tablet Take 1 tablet (50 mg total) by mouth daily. 90 tablet 0   nitroGLYCERIN  (NITROSTAT) 0.4 MG SL tablet Place 0.4 mg under the tongue every 5 (five) minutes as needed for chest pain.      pantoprazole (PROTONIX) 40 MG tablet Take 1 tablet (40 mg total) by mouth 2 (two) times daily. 60 tablet 0   potassium chloride (KLOR-CON) 10 MEQ tablet Take 1 tablet (10 mEq total) by mouth daily. (Patient taking differently: Take 10 mEq by mouth 2 (two) times daily.) 90 tablet 1   promethazine (PHENERGAN) 25 MG tablet Take 1 tablet (25 mg total) by mouth every 6 (six) hours as needed for nausea or vomiting. 45 tablet 2   ranolazine (RANEXA) 1000 MG SR tablet TAKE 1 TABLET BY MOUTH 2 TIMES DAILY. (Patient taking differently: Take 1,000 mg by mouth 2 (two) times daily.) 60 tablet 11   Vitamin D, Ergocalciferol, (DRISDOL) 1.25 MG (50000 UNIT) CAPS capsule Take 1 capsule (50,000 Units total) by mouth every  7 (seven) days. 8 capsule 0   No current facility-administered medications for this visit.    Past Surgical History:  Procedure Laterality Date   ABDOMINAL HYSTERECTOMY     APPENDECTOMY     BACK SURGERY     thoracic-fractured    BACK SURGERY     lumbar - fractured   BREAST LUMPECTOMY WITH RADIOACTIVE SEED LOCALIZATION Right 08/01/2018   Procedure: RADIOACTIVE SEED GUIDED RIGHT BREAST LUMPECTOMY;  Surgeon: Coralie Keens, MD;  Location: [facility];  Service: General;  Laterality: Right;   COLONOSCOPY     ESOPHAGOGASTRODUODENOSCOPY  05/20/2008   Mild to moderate esophagitis. Otherwise normal EGD.    EYE SURGERY Left    cataract surgery with lens implant   GASTROSTOMY     peptic ulcer - peg tube placement   TOTAL KNEE ARTHROPLASTY Left 01/21/2021   Procedure: LEFT TOTAL KNEE ARTHROPLASTY;  Surgeon: Mcarthur Rossetti, MD;  Location: [facility];  Service: Orthopedics;  Laterality: Left;  Need RNFA   UPPER GASTROINTESTINAL ENDOSCOPY      Family History  Problem Relation Age of Onset   Cancer Mother        ovarian   Hyperlipidemia Mother    Alzheimer's disease Mother     Arthritis Mother        rheumatoid   Cancer Father        lung   Parkinson's disease Father    Alzheimer's disease Father    ALS Sister    Cancer Maternal Aunt        pancreatic, lung, liver,breast   Breast cancer Maternal Aunt    Cancer Maternal Uncle        pancreatic, liver, brain   Colon cancer Maternal Uncle    Heart attack Paternal Aunt    Stroke Paternal Aunt    Stomach cancer Paternal Aunt    Heart attack Paternal Uncle    Stroke Paternal Grandmother    Heart attack Paternal Grandfather    Arthritis Sister        rheumatoid   Huntington's disease Daughter        presumed inherited from father per patient   Colon cancer Maternal Uncle    Pancreatic cancer Maternal Uncle    Liver disease Maternal Uncle    Breast cancer Maternal Aunt    Breast cancer Maternal Aunt    Esophageal cancer Neg Hx    Rectal cancer Neg Hx     Social History   Tobacco Use   Smoking status: Former    Packs/day: 1.00    Years: 22.00    Pack years: 22.00    Types: Cigarettes    Quit date: 06/26/1986    Years since quitting: 35.4   Smokeless tobacco: Never  Vaping Use   Vaping Use: Never used  Substance Use Topics   Alcohol use: No   Drug use: No    Allergies  Allergen Reactions   Iodinated Contrast Media Hives   Nsaids Other (See Comments)    History of bleeding ulcers   Latex Hives and Rash   Sinemet [Carbidopa W-Levodopa] Nausea And Vomiting   Inderal [Propranolol] Other (See Comments)    Unknown   Indomethacin Other (See Comments)    Unknown   Penicillin G Other (See Comments)    Unknown   Pneumococcal Vaccines Other (See Comments)    Caused "pneumonia"   Scopolamine Other (See Comments)    The patch, caused her to pass out / change in mental status   Tape  Other (See Comments)    Unknown     Review of Systems: All systems reviewed and negative except where noted in HPI.    Physical Exam:    Today's Vitals   11/15/21 1507  BP: 140/80  Pulse: 100  SpO2: 99%   Weight: 146 lb 8 oz (66.5 kg)  Height: 5' (1.524 m)   Body mass index is 28.61 kg/m. Gen: awake, alert, NAD HEENT: anicteric, no pallor CV: RRR, no mrg Pulm: CTA b/l Abd: soft, NT/ND, +BS throughout Ext: no c/c/e Neuro: nonfocal     Rahkim Rabalais,MD 11/15/2021, 3:12 PM   CC Abonza, Maritza, PA-C

## 2021-11-16 LAB — AFP TUMOR MARKER: AFP-Tumor Marker: 3.8 ng/mL

## 2021-11-17 ENCOUNTER — Ambulatory Visit: Payer: Self-pay

## 2021-11-17 ENCOUNTER — Ambulatory Visit: Payer: Medicare HMO | Admitting: Specialist

## 2021-11-17 ENCOUNTER — Encounter: Payer: Self-pay | Admitting: Specialist

## 2021-11-17 VITALS — BP 128/82 | Ht 60.0 in | Wt 147.0 lb

## 2021-11-17 DIAGNOSIS — M4005 Postural kyphosis, thoracolumbar region: Secondary | ICD-10-CM

## 2021-11-17 DIAGNOSIS — M4004 Postural kyphosis, thoracic region: Secondary | ICD-10-CM | POA: Diagnosis not present

## 2021-11-17 DIAGNOSIS — M5136 Other intervertebral disc degeneration, lumbar region: Secondary | ICD-10-CM

## 2021-11-17 DIAGNOSIS — M4722 Other spondylosis with radiculopathy, cervical region: Secondary | ICD-10-CM

## 2021-11-17 DIAGNOSIS — M545 Low back pain, unspecified: Secondary | ICD-10-CM | POA: Diagnosis not present

## 2021-11-17 DIAGNOSIS — M47812 Spondylosis without myelopathy or radiculopathy, cervical region: Secondary | ICD-10-CM | POA: Diagnosis not present

## 2021-11-17 MED ORDER — PREGABALIN 50 MG PO CAPS: 50.0000 mg | ORAL_CAPSULE | Freq: Three times a day (TID) | ORAL | 0 refills | Status: DC

## 2021-11-17 NOTE — Patient Instructions (Addendum)
Fall Prevention and Home Safety Falls cause injuries and can affect all age groups. It is possible to use preventive measures to significantly decrease the likelihood of falls. There are many simple measures which can make your home safer and prevent falls. OUTDOORS Repair cracks and edges of walkways and driveways. Remove high doorway thresholds. Trim shrubbery on the main path into your home. Have good outside lighting. Clear walkways of tools, rocks, debris, and clutter. Check that handrails are not broken and are securely fastened. Both sides of steps should have handrails. Have leaves, snow, and ice cleared regularly. Use sand or salt on walkways during winter months. In the garage, clean up grease or oil spills. BATHROOM Install night lights. Install grab bars by the toilet and in the tub and shower. Use non-skid mats or decals in the tub or shower. Place a plastic non-slip stool in the shower to sit on, if needed. Keep floors dry and clean up all water on the floor immediately. Remove soap buildup in the tub or shower on a regular basis. Secure bath mats with non-slip, double-sided rug tape. Remove throw rugs and tripping hazards from the floors. BEDROOMS Install night lights. Make sure a bedside light is easy to reach. Do not use oversized bedding. Keep a telephone by your bedside. Have a firm chair with side arms to use for getting dressed. Remove throw rugs and tripping hazards from the floor. KITCHEN Keep handles on pots and pans turned toward the center of the stove. Use back burners when possible. Clean up spills quickly and allow time for drying. Avoid walking on wet floors. Avoid hot utensils and knives. Position shelves so they are not too high or low. Place commonly used objects within easy reach. If necessary, use a sturdy step stool with a grab bar when reaching. Keep electrical cables out of the way. Do not use floor polish or wax that makes floors slippery.  If you must use wax, use non-skid floor wax. Remove throw rugs and tripping hazards from the floor. STAIRWAYS Never leave objects on stairs. Place handrails on both sides of stairways and use them. Fix any loose handrails. Make sure handrails on both sides of the stairways are as long as the stairs. Check carpeting to make sure it is firmly attached along stairs. Make repairs to worn or loose carpet promptly. Avoid placing throw rugs at the top or bottom of stairways, or properly secure the rug with carpet tape to prevent slippage. Get rid of throw rugs, if possible. Have an electrician put in a light switch at the top and bottom of the stairs. OTHER FALL PREVENTION TIPS Wear low-heel or rubber-soled shoes that are supportive and fit well. Wear closed toe shoes. When using a stepladder, make sure it is fully opened and both spreaders are firmly locked. Do not climb a closed stepladder. Add color or contrast paint or tape to grab bars and handrails in your home. Place contrasting color strips on first and last steps. Learn and use mobility aids as needed. Install an electrical emergency response system. Turn on lights to avoid dark areas. Replace light bulbs that burn out immediately. Get light switches that glow. Arrange furniture to create clear pathways. Keep furniture in the same place. Firmly attach carpet with non-skid or double-sided tape. Eliminate uneven floor surfaces. Select a carpet pattern that does not visually hide the edge of steps. Be aware of all pets. OTHER HOME SAFETY TIPS Set the water temperature for 120 F (48.8 C). Keep  emergency numbers on or near the telephone. Keep smoke detectors on every level of the home and near sleeping areas. Document Released: 06/02/2002 Document Revised: 12/12/2011 Document Reviewed: 09/01/2011 [facility] Patient Information 2014 [place].   Avoid overhead lifting and overhead use of the arms. Do not lift greater than 5 lbs. Adjust  head rest in vehicle to prevent hyperextension if rear ended. Take extra precautions to avoid falling.  Hemp CBD capsules, amazon.com 5,000-7,000 mg per bottle, 60 capsules per bottle, take one capsule twice a day.  Follow-Up Instructions: No follow-ups on file.

## 2021-11-17 NOTE — Progress Notes (Signed)
Office Visit Note   Patient: Amy Moon           Date of Birth: Dec 15, 1950           MRN: [id number] Visit Date: 11/17/2021              Requested by: Lorrene Reid, PA-C [address] [place],  [place] [address] PCP: Lorrene Reid, PA-C   Assessment & Plan: Visit Diagnoses:  1. Low back pain, unspecified back pain laterality, unspecified chronicity, unspecified whether sciatica present   2. Spondylosis without myelopathy or radiculopathy, cervical region   3. Degenerative disc disease, lumbar   4. Other spondylosis with radiculopathy, cervical region   5. Postural kyphosis of lumbar region   6. Postural kyphosis of thoracic region     Plan: Fall Prevention and Home Safety Falls cause injuries and can affect all age groups. It is possible to use preventive measures to significantly decrease the likelihood of falls. There are many simple measures which can make your home safer and prevent falls. OUTDOORS Repair cracks and edges of walkways and driveways. Remove high doorway thresholds. Trim shrubbery on the main path into your home. Have good outside lighting. Clear walkways of tools, rocks, debris, and clutter. Check that handrails are not broken and are securely fastened. Both sides of steps should have handrails. Have leaves, snow, and ice cleared regularly. Use sand or salt on walkways during winter months. In the garage, clean up grease or oil spills. BATHROOM Install night lights. Install grab bars by the toilet and in the tub and shower. Use non-skid mats or decals in the tub or shower. Place a plastic non-slip stool in the shower to sit on, if needed. Keep floors dry and clean up all water on the floor immediately. Remove soap buildup in the tub or shower on a regular basis. Secure bath mats with non-slip, double-sided rug tape. Remove throw rugs and tripping hazards from the floors. BEDROOMS Install night lights. Make sure a bedside light is  easy to reach. Do not use oversized bedding. Keep a telephone by your bedside. Have a firm chair with side arms to use for getting dressed. Remove throw rugs and tripping hazards from the floor. KITCHEN Keep handles on pots and pans turned toward the center of the stove. Use back burners when possible. Clean up spills quickly and allow time for drying. Avoid walking on wet floors. Avoid hot utensils and knives. Position shelves so they are not too high or low. Place commonly used objects within easy reach. If necessary, use a sturdy step stool with a grab bar when reaching. Keep electrical cables out of the way. Do not use floor polish or wax that makes floors slippery. If you must use wax, use non-skid floor wax. Remove throw rugs and tripping hazards from the floor. STAIRWAYS Never leave objects on stairs. Place handrails on both sides of stairways and use them. Fix any loose handrails. Make sure handrails on both sides of the stairways are as long as the stairs. Check carpeting to make sure it is firmly attached along stairs. Make repairs to worn or loose carpet promptly. Avoid placing throw rugs at the top or bottom of stairways, or properly secure the rug with carpet tape to prevent slippage. Get rid of throw rugs, if possible. Have an electrician put in a light switch at the top and bottom of the stairs. OTHER FALL PREVENTION TIPS Wear low-heel or rubber-soled shoes that are supportive and  fit well. Wear closed toe shoes. When using a stepladder, make sure it is fully opened and both spreaders are firmly locked. Do not climb a closed stepladder. Add color or contrast paint or tape to grab bars and handrails in your home. Place contrasting color strips on first and last steps. Learn and use mobility aids as needed. Install an electrical emergency response system. Turn on lights to avoid dark areas. Replace light bulbs that burn out immediately. Get light switches that glow. Arrange  furniture to create clear pathways. Keep furniture in the same place. Firmly attach carpet with non-skid or double-sided tape. Eliminate uneven floor surfaces. Select a carpet pattern that does not visually hide the edge of steps. Be aware of all pets. OTHER HOME SAFETY TIPS Set the water temperature for 120 F (48.8 C). Keep emergency numbers on or near the telephone. Keep smoke detectors on every level of the home and near sleeping areas. Document Released: 06/02/2002 Document Revised: 12/12/2011 Document Reviewed: 09/01/2011 [facility] Patient Information 2014 [place].   Avoid overhead lifting and overhead use of the arms. Do not lift greater than 5 lbs. Adjust head rest in vehicle to prevent hyperextension if rear ended. Take extra precautions to avoid falling.  Hemp CBD capsules, amazon.com 5,000-7,000 mg per bottle, 60 capsules per bottle, take one capsule twice a day.  Follow-Up Instructions: No follow-ups on file.    Follow-Up Instructions: Return in about 4 weeks (around 12/15/2021).   Orders:  Orders Placed This Encounter  Procedures   XR Lumbar Spine 2-3 Views   XR Cervical Spine 2 or 3 views   XR SCOLIOSIS EVAL COMPLETE SPINE 2 OR 3 VIEWS   Ambulatory referral to Physical Therapy   Ambulatory referral to Pain Clinic   No orders of the defined types were placed in this encounter.     Procedures: No procedures performed   Clinical Data: No additional findings.   Subjective: Chief Complaint  Patient presents with   Lower Back - Follow-up    71 year old female with history of severe cervical spondylosis and had previously been taking narcotics. She also had severe knee arthrosis. She has had some significant changes in family with her old sister passing away in January week of her birthday. She also had her exhusband leave her home. Tylenol reports that she has been experiencing feelings of fever like sensation since September 2022. Complains of  increased neck pain and stiffness. She has some moving down of the pain into the trapezius and into the shoulders and collar bone, from the ears and into the posterior arms  to the elbows. No sure why she feels like there are fevers and her eyes burn and she feels like she is moving in slow motion. Had colonoscopy and Dr. Lyndel Safe found a polyp but did not remove due to her being on plavix no bladder. She relates that the temperature goes as high as 102 degrees.No pain with urination, no blood in the urine. Legs swell and she is on lasix. She notices nausea and has lost about 40 lbs and she is not trying to lose weight.   Review of Systems   Objective: Vital Signs: BP 128/82   Physical Exam  Ortho Exam  Specialty Comments:  No specialty comments available.  Imaging: No results found.   PMFS History: Patient Active Problem List   Diagnosis Date Noted   Cellulitis of left lower extremity 03/15/2021   Atopic dermatitis 03/15/2021   Encounter for screening for COVID-19  03/15/2021   Status post left knee replacement 01/22/2021   Status post total left knee replacement 01/21/2021   Unilateral primary osteoarthritis, left knee 11/30/2020   Dyslipidemia 04/02/2020   Splenomegaly    Portal hypertension ([place])    Pneumonia    Osteoporosis    Osteopenia    Neuropathy    Low vitamin D level    History of kidney stones    Hiatal hernia    Hepatic disease    Granulomatous disease (HCC)    GERD (gastroesophageal reflux disease)    Enlarged thyroid    Duodenal ulcer    Drug-induced low platelet count    Depression    Clotting disorder (HCC)    Cirrhosis ([place])    Chronic kidney disease    Cataract    Cancer ([place])    Asthma    Arthritis    Angina pectoris ([place])    Anemia    Allergy    Stage 3 chronic kidney disease ([place]) 10/21/2019   History of smoking 25-50 pack years- quit age 39, smoked 2ppd from 71 yo-71 yo. 10/21/2019   History of surgery- pt has several Sx- no personal  issues wiht anesthesia  10/21/2019   Sleep disturbance 10/21/2019   History of exposure to asbestos 10/21/2019   Family history of adverse reaction to anesthesia    Dyspnea on exertion 10/17/2019   Encounter for Medicare annual wellness exam 05/09/2018   Vitamin D deficiency 11/05/2017   Encounter for hepatitis C virus screening test for high risk patient 11/05/2017   Dysphagia 09/03/2017   Thrombocytopenia ([place]) 09/03/2017   Healthcare maintenance 07/30/2017   Chronic pain syndrome 07/30/2017   Chest pain 07/30/2017   Parkinson's disease ([place]) 07/30/2017   Hypertension 07/30/2017   Severe episode of recurrent major depressive disorder, without psychotic features ([place]) 05/15/2016   Tremor of both hands 05/15/2016   Severe obesity (BMI >= 40) ([place]) 05/15/2016   Nausea 03/01/2016   Irritant contact dermatitis 08/25/2015   Past Medical History:  Diagnosis Date   Allergy    Anemia    Angina pectoris ([employer])    Arthritis    Asthma    years ago   Bronchitis 06/03/2018   Cancer ([place])    squamous cell carcinoma, nose   Cataract    Chest pain 07/30/2017   Chronic kidney disease    possibly per pt   Chronic pain syndrome 07/30/2017   Cirrhosis (HCC)    Clotting disorder (HCC)    platelets are low   Coronary artery disease 04/02/2020   Depression    Drug-induced low platelet count    Duodenal ulcer    Dyslipidemia 04/02/2020   Dysphagia 09/03/2017   Dyspnea on exertion 10/17/2019   Encounter for hepatitis C virus screening test for high risk patient 11/05/2017   Encounter for Medicare annual wellness exam 05/09/2018   Enlarged thyroid    Family history of adverse reaction to anesthesia    father had difficulty waking up and breathing on his own after surgery   GERD (gastroesophageal reflux disease)    Granulomatous disease ([place])    Healthcare maintenance 07/30/2017   Hepatic disease    Hiatal hernia    History of exposure to asbestos 10/21/2019   History of kidney stones     History of smoking 25-50 pack years- quit age 23, smoked 2ppd from 71 yo-71 yo. 10/21/2019   History of surgery- pt has several Sx- no personal issues wiht anesthesia  10/21/2019   Hypertension  Irritant contact dermatitis 08/25/2015   Low vitamin D level    Migraine    Nausea 03/01/2016   Neuropathy    Osteopenia    Osteoporosis    Parkinson's disease (HCC)    Peripheral vascular disease (HCC)    Pneumonia    PONV (postoperative nausea and vomiting)    Portal hypertension (HCC)    Severe episode of recurrent major depressive disorder, without psychotic features ([place]) 05/15/2016   Severe obesity (BMI >= 40) ([place]) 05/15/2016   Sleep disturbance 10/21/2019   Splenomegaly a   Stage 3 chronic kidney disease ([place]) 10/21/2019   Thrombocytopenia ([place]) 09/03/2017   Tremor of both hands 05/15/2016   Vitamin D deficiency 11/05/2017    Family History  Problem Relation Age of Onset   Cancer Mother        ovarian   Hyperlipidemia Mother    Alzheimer's disease Mother    Arthritis Mother        rheumatoid   Cancer Father        lung   Parkinson's disease Father    Alzheimer's disease Father    ALS Sister    Cancer Maternal Aunt        pancreatic, lung, liver,breast   Breast cancer Maternal Aunt    Cancer Maternal Uncle        pancreatic, liver, brain   Colon cancer Maternal Uncle    Heart attack Paternal Aunt    Stroke Paternal Aunt    Stomach cancer Paternal Aunt    Heart attack Paternal Uncle    Stroke Paternal Grandmother    Heart attack Paternal Grandfather    Arthritis Sister        rheumatoid   Huntington's disease Daughter        presumed inherited from father per patient   Colon cancer Maternal Uncle    Pancreatic cancer Maternal Uncle    Liver disease Maternal Uncle    Breast cancer Maternal Aunt    Breast cancer Maternal Aunt    Esophageal cancer Neg Hx    Rectal cancer Neg Hx     Past Surgical History:  Procedure Laterality Date   ABDOMINAL HYSTERECTOMY      APPENDECTOMY     BACK SURGERY     thoracic-fractured    BACK SURGERY     lumbar - fractured   BREAST LUMPECTOMY WITH RADIOACTIVE SEED LOCALIZATION Right 08/01/2018   Procedure: RADIOACTIVE SEED GUIDED RIGHT BREAST LUMPECTOMY;  Surgeon: Coralie Keens, MD;  Location: [facility];  Service: General;  Laterality: Right;   COLONOSCOPY     ESOPHAGOGASTRODUODENOSCOPY  05/20/2008   Mild to moderate esophagitis. Otherwise normal EGD.    EYE SURGERY Left    cataract surgery with lens implant   GASTROSTOMY     peptic ulcer - peg tube placement   TOTAL KNEE ARTHROPLASTY Left 01/21/2021   Procedure: LEFT TOTAL KNEE ARTHROPLASTY;  Surgeon: Mcarthur Rossetti, MD;  Location: [facility];  Service: Orthopedics;  Laterality: Left;  Need RNFA   UPPER GASTROINTESTINAL ENDOSCOPY     Social History   Occupational History   Not on file  Tobacco Use   Smoking status: Former    Packs/day: 1.00    Years: 22.00    Pack years: 22.00    Types: Cigarettes    Quit date: 06/26/1986    Years since quitting: 35.4   Smokeless tobacco: Never  Vaping Use   Vaping Use: Never used  Substance and Sexual Activity   Alcohol use:  No   Drug use: No   Sexual activity: Not Currently    Birth control/protection: None, Surgical

## 2021-11-24 ENCOUNTER — Telehealth: Payer: Self-pay | Admitting: Physician Assistant

## 2021-11-24 ENCOUNTER — Other Ambulatory Visit: Payer: Self-pay | Admitting: Cardiology

## 2021-11-24 NOTE — Telephone Encounter (Signed)
Patient made aware. AS, CMA

## 2021-11-25 ENCOUNTER — Other Ambulatory Visit: Payer: Self-pay

## 2021-11-25 DIAGNOSIS — K5909 Other constipation: Secondary | ICD-10-CM

## 2021-11-25 DIAGNOSIS — K7469 Other cirrhosis of liver: Secondary | ICD-10-CM

## 2021-11-25 DIAGNOSIS — R1011 Right upper quadrant pain: Secondary | ICD-10-CM

## 2021-11-25 DIAGNOSIS — K8020 Calculus of gallbladder without cholecystitis without obstruction: Secondary | ICD-10-CM

## 2021-11-29 ENCOUNTER — Ambulatory Visit (HOSPITAL_COMMUNITY)
Admission: RE | Admit: 2021-11-29 | Discharge: 2021-11-29 | Disposition: A | Discharge status: Home / Self Care | Payer: Medicare HMO | Source: Ambulatory Visit | Attending: Gastroenterology | Admitting: Gastroenterology

## 2021-11-29 DIAGNOSIS — K7469 Other cirrhosis of liver: Secondary | ICD-10-CM | POA: Insufficient documentation

## 2021-11-29 DIAGNOSIS — R1011 Right upper quadrant pain: Secondary | ICD-10-CM | POA: Diagnosis not present

## 2021-11-29 DIAGNOSIS — K7689 Other specified diseases of liver: Secondary | ICD-10-CM | POA: Diagnosis not present

## 2021-11-29 DIAGNOSIS — K8020 Calculus of gallbladder without cholecystitis without obstruction: Secondary | ICD-10-CM | POA: Diagnosis not present

## 2021-11-29 DIAGNOSIS — K5909 Other constipation: Secondary | ICD-10-CM | POA: Diagnosis not present

## 2021-11-29 DIAGNOSIS — K766 Portal hypertension: Secondary | ICD-10-CM | POA: Diagnosis not present

## 2021-11-29 DIAGNOSIS — K5900 Constipation, unspecified: Secondary | ICD-10-CM | POA: Diagnosis not present

## 2021-11-29 MED ORDER — IOHEXOL 300 MG/ML  SOLN
80.0000 mL | Freq: Once | INTRAMUSCULAR | Status: AC | PRN
  Administered 2021-11-29: 80 mL via INTRAVENOUS

## 2021-11-29 MED ORDER — SODIUM CHLORIDE (PF) 0.9 % IJ SOLN
INTRAMUSCULAR | Status: AC
  Filled 2021-11-29: qty 50

## 2021-12-02 ENCOUNTER — Ambulatory Visit: Payer: Medicare HMO | Admitting: Rehabilitative and Restorative Service Providers"

## 2021-12-08 ENCOUNTER — Other Ambulatory Visit: Payer: Self-pay | Admitting: Gastroenterology

## 2021-12-08 ENCOUNTER — Other Ambulatory Visit: Payer: Self-pay | Admitting: Cardiology

## 2021-12-09 ENCOUNTER — Other Ambulatory Visit: Payer: Self-pay | Admitting: Physician Assistant

## 2021-12-09 DIAGNOSIS — R7989 Other specified abnormal findings of blood chemistry: Secondary | ICD-10-CM

## 2021-12-12 ENCOUNTER — Other Ambulatory Visit: Payer: Medicare HMO

## 2021-12-12 DIAGNOSIS — R7989 Other specified abnormal findings of blood chemistry: Secondary | ICD-10-CM | POA: Diagnosis not present

## 2021-12-13 ENCOUNTER — Encounter: Payer: Self-pay | Admitting: Cardiology

## 2021-12-13 ENCOUNTER — Ambulatory Visit: Payer: Medicare HMO | Admitting: Cardiology

## 2021-12-13 VITALS — BP 104/68 | HR 117 | Ht 60.0 in | Wt 146.4 lb

## 2021-12-13 DIAGNOSIS — K766 Portal hypertension: Secondary | ICD-10-CM

## 2021-12-13 DIAGNOSIS — I10 Essential (primary) hypertension: Secondary | ICD-10-CM

## 2021-12-13 DIAGNOSIS — Z79899 Other long term (current) drug therapy: Secondary | ICD-10-CM | POA: Diagnosis not present

## 2021-12-13 DIAGNOSIS — K7460 Unspecified cirrhosis of liver: Secondary | ICD-10-CM

## 2021-12-13 DIAGNOSIS — R0609 Other forms of dyspnea: Secondary | ICD-10-CM | POA: Diagnosis not present

## 2021-12-13 DIAGNOSIS — I209 Angina pectoris, unspecified: Secondary | ICD-10-CM | POA: Diagnosis not present

## 2021-12-13 DIAGNOSIS — Z87891 Personal history of nicotine dependence: Secondary | ICD-10-CM | POA: Diagnosis not present

## 2021-12-13 LAB — CBC
Hematocrit: 37.2 % (ref 34.0–46.6)
Hemoglobin: 12.4 g/dL (ref 11.1–15.9)
MCH: 32.8 pg (ref 26.6–33.0)
MCHC: 33.3 g/dL (ref 31.5–35.7)
MCV: 98 fL — ABNORMAL HIGH (ref 79–97)
Platelets: 172 10*3/uL (ref 150–450)
RBC: 3.78 x10E6/uL (ref 3.77–5.28)
RDW: 12.5 % (ref 11.7–15.4)
WBC: 6 10*3/uL (ref 3.4–10.8)

## 2021-12-13 MED ORDER — FUROSEMIDE 40 MG PO TABS: ORAL_TABLET | ORAL | 1 refills | Status: DC

## 2021-12-13 MED ORDER — ISOSORBIDE MONONITRATE ER 60 MG PO TB24: 60.0000 mg | ORAL_TABLET | Freq: Every day | ORAL | 2 refills | Status: DC

## 2021-12-13 NOTE — Progress Notes (Signed)
Cardiology Office Note:    Date:  12/13/2021   ID:  Amy Moon, DOB [birth year], MRN [id number]  PCP:  Lorrene Reid, PA-C  Cardiologist:  Jenne Campus, MD    Referring MD: Lorrene Reid, PA-C   Chief Complaint  Patient presents with   heart flutter    History of Present Illness:    Amy Moon is a 71 y.o. female with past medical history significant for coronary artery disease, she did have coronary CT angio done year ago which showed 65% stenosis of LAD however hemodynamically insignificant, she also had some issue with the right coronary artery which is noncritical.  She also had history of smoking, asbestos exposure, dyslipidemia, portal hypertension, cirrhosis of the liver. Comes today to my office for follow-up overall she says she is not doing well she is tired exhausted gets some shortness of breath.  Described to have some swelling of lower extremities.  Chest pain that she complained about before improved quite significantly with isosorbide.  She also noted to have low-grade fever almost all the time.  Past Medical History:  Diagnosis Date   Allergy    Anemia    Angina pectoris ([place])    Arthritis    Asthma    years ago   Bronchitis 06/03/2018   Cancer ([place])    squamous cell carcinoma, nose   Cataract    Chest pain 07/30/2017   Chronic kidney disease    possibly per pt   Chronic pain syndrome 07/30/2017   Cirrhosis ([place])    Clotting disorder (HCC)    platelets are low   Coronary artery disease 04/02/2020   Depression    Drug-induced low platelet count    Duodenal ulcer    Dyslipidemia 04/02/2020   Dysphagia 09/03/2017   Dyspnea on exertion 10/17/2019   Encounter for hepatitis C virus screening test for high risk patient 11/05/2017   Encounter for Medicare annual wellness exam 05/09/2018   Enlarged thyroid    Family history of adverse reaction to anesthesia    father had difficulty waking up and breathing on his own after surgery   GERD  (gastroesophageal reflux disease)    Granulomatous disease ([place])    Healthcare maintenance 07/30/2017   Hepatic disease    Hiatal hernia    History of exposure to asbestos 10/21/2019   History of kidney stones    History of smoking 25-50 pack years- quit age 63, smoked 2ppd from 71 yo-71 yo. 10/21/2019   History of surgery- pt has several Sx- no personal issues wiht anesthesia  10/21/2019   Hypertension    Irritant contact dermatitis 08/25/2015   Low vitamin D level    Migraine    Nausea 03/01/2016   Neuropathy    Osteopenia    Osteoporosis    Parkinson's disease ([place])    Peripheral vascular disease ([place])    Pneumonia    PONV (postoperative nausea and vomiting)    Portal hypertension (HCC)    Severe episode of recurrent major depressive disorder, without psychotic features ([place]) 05/15/2016   Severe obesity (BMI >= 40) ([place]) 05/15/2016   Sleep disturbance 10/21/2019   Splenomegaly a   Stage 3 chronic kidney disease ([place]) 10/21/2019   Thrombocytopenia ([place]) 09/03/2017   Tremor of both hands 05/15/2016   Vitamin D deficiency 11/05/2017    Past Surgical History:  Procedure Laterality Date   ABDOMINAL HYSTERECTOMY     APPENDECTOMY     BACK SURGERY     thoracic-fractured  BACK SURGERY     lumbar - fractured   BREAST LUMPECTOMY WITH RADIOACTIVE SEED LOCALIZATION Right 08/01/2018   Procedure: RADIOACTIVE SEED GUIDED RIGHT BREAST LUMPECTOMY;  Surgeon: Coralie Keens, MD;  Location: [facility];  Service: General;  Laterality: Right;   COLONOSCOPY     ESOPHAGOGASTRODUODENOSCOPY  05/20/2008   Mild to moderate esophagitis. Otherwise normal EGD.    EYE SURGERY Left    cataract surgery with lens implant   GASTROSTOMY     peptic ulcer - peg tube placement   TOTAL KNEE ARTHROPLASTY Left 01/21/2021   Procedure: LEFT TOTAL KNEE ARTHROPLASTY;  Surgeon: Mcarthur Rossetti, MD;  Location: [facility];  Service: Orthopedics;  Laterality: Left;  Need RNFA   UPPER GASTROINTESTINAL ENDOSCOPY       Current Medications: Current Meds  Medication Sig   acetaminophen (TYLENOL) 325 MG tablet Take 650 mg by mouth every 6 (six) hours as needed for mild pain or moderate pain.   atorvastatin (LIPITOR) 10 MG tablet Take 1 tablet (10 mg total) by mouth daily. Patient needs an appointment for further refills. Final attempt   bisacodyl (DULCOLAX) 5 MG EC tablet Take 15 mg by mouth at bedtime as needed for moderate constipation.   clopidogrel (PLAVIX) 75 MG tablet Take 1 tablet (75 mg total) by mouth daily.   cyclobenzaprine (FLEXERIL) 10 MG tablet TAKE 1 TABLET BY MOUTH EVERY 8 HOURS AS NEEDED FOR MUSCLE SPASMS. (Patient taking differently: Take 10 mg by mouth 3 (three) times daily as needed for muscle spasms.)   diphenhydrAMINE (BENADRYL) 25 MG tablet Take 25 mg by mouth every 6 (six) hours as needed for allergies.   diphenhydrAMINE (BENADRYL) 25 MG tablet Take 1 tablet (25 mg total) by mouth as directed. Take 1 tablet 1 hour before CT   fluticasone (CUTIVATE) 0.05 % cream Apply topically 2 (two) times daily. Apply topically 2 (two) times daily per rectum for 10 days. (Patient taking differently: Apply 1 Application topically 2 (two) times daily. Apply topically 2 (two) times daily per rectum for 10 days.)   furosemide (LASIX) 40 MG tablet Take 1 tablet (40 mg total) by mouth daily. Please arrange appt for further refills. Final attempt   hydrocortisone (ANUSOL-HC) 2.5 % rectal cream Place 1 application. rectally 2 (two) times daily. For 10 days   losartan (COZAAR) 50 MG tablet Take 1 tablet (50 mg total) by mouth daily.   nitroGLYCERIN (NITROSTAT) 0.4 MG SL tablet Place 0.4 mg under the tongue every 5 (five) minutes as needed for chest pain.    pantoprazole (PROTONIX) 40 MG tablet TAKE 1 TABLET BY MOUTH 2 TIMES DAILY. (Patient taking differently: Take 40 mg by mouth daily.)   potassium chloride (KLOR-CON) 10 MEQ tablet Take 1 tablet (10 mEq total) by mouth daily. (Patient taking differently: Take  10 mEq by mouth 2 (two) times daily.)   pregabalin (LYRICA) 50 MG capsule Take 1 capsule (50 mg total) by mouth 3 (three) times daily.   promethazine (PHENERGAN) 25 MG tablet Take 1 tablet (25 mg total) by mouth every 6 (six) hours as needed for nausea or vomiting.   ranolazine (RANEXA) 1000 MG SR tablet TAKE 1 TABLET BY MOUTH 2 TIMES DAILY. (Patient taking differently: Take 1,000 mg by mouth 2 (two) times daily.)   Vitamin D, Ergocalciferol, (DRISDOL) 1.25 MG (50000 UNIT) CAPS capsule Take 1 capsule (50,000 Units total) by mouth every 7 (seven) days.   [DISCONTINUED] predniSONE (DELTASONE) 50 MG tablet Take 1 tablet (50 mg total) by  mouth as directed. Take 1 tablet 13 hours before CT, then 7 hours before CT and then 1 hour before CT.     Allergies:   Iodinated contrast media, Nsaids, Latex, Sinemet [carbidopa w-levodopa], Inderal [propranolol], Indomethacin, Penicillin g, Pneumococcal vaccines, Scopolamine, and Tape   Social History   Socioeconomic History   Marital status: Married    Spouse name: Not on file   Number of children: 3   Years of education: Not on file   Highest education level: Not on file  Occupational History   Not on file  Tobacco Use   Smoking status: Former    Packs/day: 1.00    Years: 22.00    Total pack years: 22.00    Types: Cigarettes    Quit date: 06/26/1986    Years since quitting: 35.4   Smokeless tobacco: Never  Vaping Use   Vaping Use: Never used  Substance and Sexual Activity   Alcohol use: No   Drug use: No   Sexual activity: Not Currently    Birth control/protection: None, Surgical  Other Topics Concern   Not on file  Social History Narrative   Not on file   Social Determinants of Health   Financial Resource Strain: Not on file  Food Insecurity: Not on file  Transportation Needs: Not on file  Physical Activity: Not on file  Stress: Not on file  Social Connections: Not on file     Family History: The patient's family history includes  ALS in her sister; Alzheimer's disease in her father and mother; Arthritis in her mother and sister; Breast cancer in her maternal aunt, maternal aunt, and maternal aunt; Cancer in her father, maternal aunt, maternal uncle, and mother; Colon cancer in her maternal uncle and maternal uncle; Heart attack in her paternal aunt, paternal grandfather, and paternal uncle; Huntington's disease in her daughter; Hyperlipidemia in her mother; Liver disease in her maternal uncle; Pancreatic cancer in her maternal uncle; Parkinson's disease in her father; Stomach cancer in her paternal aunt; Stroke in her paternal aunt and paternal grandmother. There is no history of Esophageal cancer or Rectal cancer. ROS:   Please see the history of present illness.    All 14 point review of systems negative except as described per history of present illness  EKGs/Labs/Other Studies Reviewed:      Recent Labs: 10/12/2021: TSH 0.364 11/15/2021: ALT 12; BUN 10; Creatinine, Ser 0.54; Potassium 3.5; Sodium 139 12/12/2021: Hemoglobin 12.4; Platelets 172  Recent Lipid Panel    Component Value Date/Time   CHOL 140 08/31/2021 1111   TRIG 81 08/31/2021 1111   HDL 61 08/31/2021 1111   CHOLHDL 2.3 08/31/2021 1111   LDLCALC 63 08/31/2021 1111    Physical Exam:    VS:  BP 104/68 (BP Location: Left Arm, Patient Position: Sitting)   Pulse (!) 117   Ht 5' (1.524 m)   Wt 146 lb 6.4 oz (66.4 kg)   SpO2 96%   BMI 28.59 kg/m     Wt Readings from Last 3 Encounters:  12/13/21 146 lb 6.4 oz (66.4 kg)  11/17/21 147 lb (66.7 kg)  11/15/21 146 lb 8 oz (66.5 kg)     GEN:  Well nourished, well developed in no acute distress HEENT: Normal NECK: No JVD; No carotid bruits LYMPHATICS: No lymphadenopathy CARDIAC: RRR, no murmurs, no rubs, no gallops RESPIRATORY:  Clear to auscultation without rales, wheezing or rhonchi  ABDOMEN: Soft, non-tender, non-distended MUSCULOSKELETAL:  No edema; No deformity  SKIN: Warm and  dry LOWER  EXTREMITIES: no swelling NEUROLOGIC:  Alert and oriented x 3 PSYCHIATRIC:  Normal affect   ASSESSMENT:    1. Angina pectoris ([place])   2. Portal hypertension ([place])   3. Primary hypertension   4. Hepatic cirrhosis, unspecified hepatic cirrhosis type, unspecified whether ascites present ([place])   5. History of smoking 25-50 pack years- quit age 61, smoked 2ppd from 71 yo-71 yo.    PLAN:    In order of problems listed above:  Coronary artery disease/angina pectoris.  We will increase dose of Imdur from 30 to 60 mg daily, will continue rest of the medication which includes antiplatelet therapy with in form of Plavix. Essential hypertension blood pressure actually is on the lower side, I will ask her to reduce dose of losartan Swelling of lower extremities which I do not see on the physical exam today I will check her proBNP as well as Chem-7 today, I will increase dose of furosemide to 60 mg daily I will cut down closer to 25 mg a day to prevent hypotension Liver cirrhosis follow-up by GI team.   Medication Adjustments/Labs and Tests Ordered: Current medicines are reviewed at length with the patient today.  Concerns regarding medicines are outlined above.  No orders of the defined types were placed in this encounter.  Medication changes: No orders of the defined types were placed in this encounter.   Signed, Park Liter, MD, [facility] 12/13/2021 1:14 PM    [place]

## 2021-12-13 NOTE — Patient Instructions (Signed)
Medication Instructions:  Your physician has recommended you make the following change in your medication:  Increase Lasix to 60 mg once daily Increase Imdur to 60 mg once daily Decrease Losartan to 25 mg once daily  *If you need a refill on your cardiac medications before your next appointment, please call your pharmacy*   Lab Work: Your physician recommends that you return for lab work in: Today for BMP and ProBnp  If you have labs (blood work) drawn today and your tests are completely normal, you will receive your results only by: MyChart Message (if you have MyChart) OR A paper copy in the mail If you have any lab test that is abnormal or we need to change your treatment, we will call you to review the results.   Testing/Procedures: NONE   Follow-Up: At [facility], you and your health needs are our priority.  As part of our continuing mission to provide you with exceptional heart care, we have created designated Provider Care Teams.  These Care Teams include your primary Cardiologist (physician) and Advanced Practice Providers (APPs -  Physician Assistants and Nurse Practitioners) who all work together to provide you with the care you need, when you need it.  We recommend signing up for the patient portal called "MyChart".  Sign up information is provided on this After Visit Summary.  MyChart is used to connect with patients for Virtual Visits (Telemedicine).  Patients are able to view lab/test results, encounter notes, upcoming appointments, etc.  Non-urgent messages can be sent to your provider as well.   To learn more about what you can do with MyChart, go to [email or web address].    Your next appointment:   5 month(s)  The format for your next appointment:   In Person  Provider:   Jenne Campus, MD    Other Instructions   Important Information About Sugar

## 2021-12-14 ENCOUNTER — Telehealth: Payer: Self-pay | Admitting: Cardiology

## 2021-12-14 LAB — BASIC METABOLIC PANEL
BUN/Creatinine Ratio: 9 — ABNORMAL LOW (ref 12–28)
BUN: 7 mg/dL — ABNORMAL LOW (ref 8–27)
CO2: 23 mmol/L (ref 20–29)
Calcium: 9.6 mg/dL (ref 8.7–10.3)
Chloride: 101 mmol/L (ref 96–106)
Creatinine, Ser: 0.75 mg/dL (ref 0.57–1.00)
Glucose: 92 mg/dL (ref 70–99)
Potassium: 3.6 mmol/L (ref 3.5–5.2)
Sodium: 144 mmol/L (ref 134–144)
eGFR: 84 mL/min/{1.73_m2} (ref >59)

## 2021-12-14 LAB — PRO B NATRIURETIC PEPTIDE: NT-Pro BNP: <36 pg/mL (ref 0–301)

## 2021-12-15 ENCOUNTER — Telehealth: Payer: Self-pay

## 2021-12-15 NOTE — Telephone Encounter (Signed)
Patient notified of results.

## 2021-12-15 NOTE — Telephone Encounter (Signed)
-----   Message from Park Liter, MD sent at 12/14/2021  3:43 PM EDT ----- Chem-7 looks good, proBNP normal no evidence of CHF continue as we discussed yesterday

## 2021-12-19 ENCOUNTER — Other Ambulatory Visit: Payer: Self-pay | Admitting: Specialist

## 2021-12-28 ENCOUNTER — Ambulatory Visit: Payer: Medicare HMO | Admitting: Rehabilitative and Restorative Service Providers"

## 2022-01-04 ENCOUNTER — Other Ambulatory Visit: Payer: Self-pay | Admitting: Cardiology

## 2022-01-20 ENCOUNTER — Other Ambulatory Visit: Payer: Self-pay | Admitting: Specialist

## 2022-01-20 ENCOUNTER — Other Ambulatory Visit: Payer: Self-pay | Admitting: Cardiology

## 2022-01-20 DIAGNOSIS — R072 Precordial pain: Secondary | ICD-10-CM

## 2022-01-26 ENCOUNTER — Ambulatory Visit: Payer: Medicare HMO | Admitting: Specialist

## 2022-02-20 DIAGNOSIS — R69 Illness, unspecified: Secondary | ICD-10-CM | POA: Diagnosis not present

## 2022-02-20 DIAGNOSIS — M47812 Spondylosis without myelopathy or radiculopathy, cervical region: Secondary | ICD-10-CM | POA: Diagnosis not present

## 2022-02-20 DIAGNOSIS — Z79891 Long term (current) use of opiate analgesic: Secondary | ICD-10-CM | POA: Diagnosis not present

## 2022-02-20 DIAGNOSIS — G894 Chronic pain syndrome: Secondary | ICD-10-CM | POA: Diagnosis not present

## 2022-02-20 DIAGNOSIS — M47816 Spondylosis without myelopathy or radiculopathy, lumbar region: Secondary | ICD-10-CM | POA: Diagnosis not present

## 2022-03-06 ENCOUNTER — Ambulatory Visit: Payer: Medicare HMO | Admitting: Specialist

## 2022-03-09 DIAGNOSIS — H524 Presbyopia: Secondary | ICD-10-CM | POA: Diagnosis not present

## 2022-03-09 DIAGNOSIS — H353131 Nonexudative age-related macular degeneration, bilateral, early dry stage: Secondary | ICD-10-CM | POA: Diagnosis not present

## 2022-03-09 DIAGNOSIS — H1789 Other corneal scars and opacities: Secondary | ICD-10-CM | POA: Diagnosis not present

## 2022-03-09 DIAGNOSIS — Z961 Presence of intraocular lens: Secondary | ICD-10-CM | POA: Diagnosis not present

## 2022-03-24 ENCOUNTER — Other Ambulatory Visit: Payer: Self-pay | Admitting: Nurse Practitioner

## 2022-03-24 DIAGNOSIS — E876 Hypokalemia: Secondary | ICD-10-CM

## 2022-03-28 ENCOUNTER — Other Ambulatory Visit: Payer: Self-pay | Admitting: Physician Assistant

## 2022-03-28 DIAGNOSIS — I10 Essential (primary) hypertension: Secondary | ICD-10-CM

## 2022-03-28 DIAGNOSIS — G894 Chronic pain syndrome: Secondary | ICD-10-CM | POA: Diagnosis not present

## 2022-03-28 DIAGNOSIS — M47812 Spondylosis without myelopathy or radiculopathy, cervical region: Secondary | ICD-10-CM | POA: Diagnosis not present

## 2022-03-28 DIAGNOSIS — R69 Illness, unspecified: Secondary | ICD-10-CM | POA: Diagnosis not present

## 2022-03-28 DIAGNOSIS — M47816 Spondylosis without myelopathy or radiculopathy, lumbar region: Secondary | ICD-10-CM | POA: Diagnosis not present

## 2022-04-06 ENCOUNTER — Encounter: Payer: Self-pay | Admitting: Specialist

## 2022-04-06 ENCOUNTER — Ambulatory Visit (INDEPENDENT_AMBULATORY_CARE_PROVIDER_SITE_OTHER): Payer: Medicare HMO | Admitting: Specialist

## 2022-04-06 ENCOUNTER — Ambulatory Visit: Payer: Self-pay

## 2022-04-06 VITALS — BP 171/98 | HR 114 | Ht 60.0 in | Wt 147.0 lb

## 2022-04-06 DIAGNOSIS — M79671 Pain in right foot: Secondary | ICD-10-CM

## 2022-04-06 DIAGNOSIS — M6701 Short Achilles tendon (acquired), right ankle: Secondary | ICD-10-CM

## 2022-04-06 DIAGNOSIS — M25571 Pain in right ankle and joints of right foot: Secondary | ICD-10-CM | POA: Diagnosis not present

## 2022-04-06 DIAGNOSIS — S93601A Unspecified sprain of right foot, initial encounter: Secondary | ICD-10-CM

## 2022-04-06 NOTE — Patient Instructions (Addendum)
  Amy Moon  is suffering from osteoarthritis and posterior tibialis defficiency and gradual developing flat Amy Moon  only real proven treatments are Weight loss, NSIADs like diclofenac gel and heel cord stretching. Well padded shoes help. Ice the Amy Moon and ankle 2-3 times a day 15-20 mins at a time. Use an arch support in your shoe to help to support the right Amy Moon arch.  Zotrix creame is used for arthrits pain and can take up to 2 weeks to start to help.

## 2022-04-06 NOTE — Progress Notes (Signed)
Office Visit Note   Patient: Amy Moon           Date of Birth: Apr 27, 1951           MRN: [id number] Visit Date: 04/06/2022              Requested by: Lorrene Reid, PA-C [place],   [id number] PCP: Lorrene Reid, PA-C   Assessment & Plan: Visit Diagnoses:  1. Pain in right ankle and joints of right foot   2. Right foot pain   3. Right foot sprain, initial encounter     Plan: Foot  is suffering from osteoarthritis and posterior tibialis defficiency and gradual developing flat foot  only real proven treatments are Weight loss, NSIADs like diclofenac gel and heel cord stretching. Well padded shoes help. Ice the foot and ankle 2-3 times a day 15-20 mins at a time. Use an arch support in your shoe to help to support the right foot arch.   Follow-Up Instructions: Return in about 8 weeks (around 06/01/2022) for Return with Dr. Sharol Given for right posterior tibial tendon deficiency.   Orders:  Orders Placed This Encounter  Procedures   XR Ankle 2 Views Right   No orders of the defined types were placed in this encounter.     Procedures: No procedures performed   Clinical Data: No additional findings.   Subjective: No chief complaint on file.   71 year old female with history of knee OA and lumbar DDD and spondylosis. She has been experiencing increasing pain into the right foot. Pain in the medial right ankle with swelling. No bowel or bladder difficulty. There is some leg numbness and tingling. She has arthritis in the hands.     Review of Systems  Constitutional: Negative.   HENT: Negative.    Eyes: Negative.   Respiratory: Negative.    Cardiovascular: Negative.   Gastrointestinal: Negative.   Endocrine: Negative.   Genitourinary: Negative.   Musculoskeletal: Negative.   Skin: Negative.   Allergic/Immunologic: Negative.   Neurological: Negative.   Hematological: Negative.   Psychiatric/Behavioral: Negative.        Objective: Vital Signs: BP (!) 171/98   Pulse (!) 114   Ht 5' (1.524 m)   Wt 147 lb (66.7 kg)   BMI 28.71 kg/m   Physical Exam Constitutional:      Appearance: She is well-developed.  HENT:     Head: Normocephalic and atraumatic.  Eyes:     Pupils: Pupils are equal, round, and reactive to light.  Pulmonary:     Effort: Pulmonary effort is normal.     Breath sounds: Normal breath sounds.  Abdominal:     General: Bowel sounds are normal.     Palpations: Abdomen is soft.  Musculoskeletal:     Cervical back: Normal range of motion and neck supple.  Skin:    General: Skin is warm and dry.  Neurological:     Mental Status: She is alert and oriented to person, place, and time.  Psychiatric:        Behavior: Behavior normal.        Thought Content: Thought content normal.        Judgment: Judgment normal.     Right Ankle Exam   Tenderness  The patient is experiencing tenderness in the deltoid and medial malleolus. Swelling: mild  Range of Motion  Dorsiflexion:  0 abnormal  Plantar flexion:  10  Eversion:  15  Inversion:  20   Muscle Strength  Dorsiflexion:  5/5 Plantar flexion:  5/5 Anterior tibial:  5/5 Posterior tibial:  3/5 Gastrocsoleus:  5/5 Peroneal muscle:  5/5  Tests  Anterior drawer: 1+ Varus tilt: 1+  Other  Erythema: present       Specialty Comments:  No specialty comments available.  Imaging: No results found.   PMFS History: Patient Active Problem List   Diagnosis Date Noted   Cellulitis of left lower extremity 03/15/2021   Atopic dermatitis 03/15/2021   Encounter for screening for COVID-19 03/15/2021   Status post left knee replacement 01/22/2021   Status post total left knee replacement 01/21/2021   Unilateral primary osteoarthritis, left knee 11/30/2020   Dyslipidemia 04/02/2020   Splenomegaly    Portal hypertension ([place])    Pneumonia    Osteoporosis    Osteopenia    Neuropathy    Low vitamin D level    History  of kidney stones    Hiatal hernia    Hepatic disease    Granulomatous disease (HCC)    GERD (gastroesophageal reflux disease)    Enlarged thyroid    Duodenal ulcer    Drug-induced low platelet count    Depression    Clotting disorder (HCC)    Cirrhosis ([place])    Chronic kidney disease    Cataract    Cancer ([place])    Asthma    Arthritis    Angina pectoris ([place])    Anemia    Allergy    Stage 3 chronic kidney disease ([place]) 10/21/2019   History of smoking 25-50 pack years- quit age 47, smoked 2ppd from 71 yo-71 yo. 10/21/2019   History of surgery- pt has several Sx- no personal issues wiht anesthesia  10/21/2019   Sleep disturbance 10/21/2019   History of exposure to asbestos 10/21/2019   Family history of adverse reaction to anesthesia    Dyspnea on exertion 10/17/2019   Encounter for Medicare annual wellness exam 05/09/2018   Vitamin D deficiency 11/05/2017   Encounter for hepatitis C virus screening test for high risk patient 11/05/2017   Dysphagia 09/03/2017   Thrombocytopenia ([place]) 09/03/2017   Healthcare maintenance 07/30/2017   Chronic pain syndrome 07/30/2017   Chest pain 07/30/2017   Parkinson's disease 07/30/2017   Hypertension 07/30/2017   Severe episode of recurrent major depressive disorder, without psychotic features ([place]) 05/15/2016   Tremor of both hands 05/15/2016   Severe obesity (BMI >= 40) ([place]) 05/15/2016   Nausea 03/01/2016   Irritant contact dermatitis 08/25/2015   Past Medical History:  Diagnosis Date   Allergy    Anemia    Angina pectoris ([place])    Arthritis    Asthma    years ago   Bronchitis 06/03/2018   Cancer ([place])    squamous cell carcinoma, nose   Cataract    Chest pain 07/30/2017   Chronic kidney disease    possibly per pt   Chronic pain syndrome 07/30/2017   Cirrhosis (HCC)    Clotting disorder (HCC)    platelets are low   Coronary artery disease 04/02/2020   Depression    Drug-induced low platelet count    Duodenal ulcer     Dyslipidemia 04/02/2020   Dysphagia 09/03/2017   Dyspnea on exertion 10/17/2019   Encounter for hepatitis C virus screening test for high risk patient 11/05/2017   Encounter for Medicare annual wellness exam 05/09/2018   Enlarged thyroid    Family history of adverse reaction to anesthesia    father  had difficulty waking up and breathing on his own after surgery   GERD (gastroesophageal reflux disease)    Granulomatous disease ([place])    Healthcare maintenance 07/30/2017   Hepatic disease    Hiatal hernia    History of exposure to asbestos 10/21/2019   History of kidney stones    History of smoking 25-50 pack years- quit age 40, smoked 2ppd from 71 yo-71 yo. 10/21/2019   History of surgery- pt has several Sx- no personal issues wiht anesthesia  10/21/2019   Hypertension    Irritant contact dermatitis 08/25/2015   Low vitamin D level    Migraine    Nausea 03/01/2016   Neuropathy    Osteopenia    Osteoporosis    Parkinson's disease    Peripheral vascular disease ([place])    Pneumonia    PONV (postoperative nausea and vomiting)    Portal hypertension (HCC)    Severe episode of recurrent major depressive disorder, without psychotic features ([place]) 05/15/2016   Severe obesity (BMI >= 40) ([place]) 05/15/2016   Sleep disturbance 10/21/2019   Splenomegaly a   Stage 3 chronic kidney disease ([place]) 10/21/2019   Thrombocytopenia ([place]) 09/03/2017   Tremor of both hands 05/15/2016   Vitamin D deficiency 11/05/2017    Family History  Problem Relation Age of Onset   Cancer Mother        ovarian   Hyperlipidemia Mother    Alzheimer's disease Mother    Arthritis Mother        rheumatoid   Cancer Father        lung   Parkinson's disease Father    Alzheimer's disease Father    ALS Sister    Cancer Maternal Aunt        pancreatic, lung, liver,breast   Breast cancer Maternal Aunt    Cancer Maternal Uncle        pancreatic, liver, brain   Colon cancer Maternal Uncle    Heart attack Paternal  Aunt    Stroke Paternal Aunt    Stomach cancer Paternal Aunt    Heart attack Paternal Uncle    Stroke Paternal Grandmother    Heart attack Paternal Grandfather    Arthritis Sister        rheumatoid   Huntington's disease Daughter        presumed inherited from father per patient   Colon cancer Maternal Uncle    Pancreatic cancer Maternal Uncle    Liver disease Maternal Uncle    Breast cancer Maternal Aunt    Breast cancer Maternal Aunt    Esophageal cancer Neg Hx    Rectal cancer Neg Hx     Past Surgical History:  Procedure Laterality Date   ABDOMINAL HYSTERECTOMY     APPENDECTOMY     BACK SURGERY     thoracic-fractured    BACK SURGERY     lumbar - fractured   BREAST LUMPECTOMY WITH RADIOACTIVE SEED LOCALIZATION Right 08/01/2018   Procedure: RADIOACTIVE SEED GUIDED RIGHT BREAST LUMPECTOMY;  Surgeon: Coralie Keens, MD;  Location: [facility];  Service: General;  Laterality: Right;   COLONOSCOPY     ESOPHAGOGASTRODUODENOSCOPY  05/20/2008   Mild to moderate esophagitis. Otherwise normal EGD.    EYE SURGERY Left    cataract surgery with lens implant   GASTROSTOMY     peptic ulcer - peg tube placement   TOTAL KNEE ARTHROPLASTY Left 01/21/2021   Procedure: LEFT TOTAL KNEE ARTHROPLASTY;  Surgeon: Mcarthur Rossetti, MD;  Location: [facility];  Service: Orthopedics;  Laterality: Left;  Need RNFA   UPPER GASTROINTESTINAL ENDOSCOPY     Social History   Occupational History   Not on file  Tobacco Use   Smoking status: Former    Packs/day: 1.00    Years: 22.00    Total pack years: 22.00    Types: Cigarettes    Quit date: 06/26/1986    Years since quitting: 35.8   Smokeless tobacco: Never  Vaping Use   Vaping Use: Never used  Substance and Sexual Activity   Alcohol use: No   Drug use: No   Sexual activity: Not Currently    Birth control/protection: None, Surgical

## 2022-04-13 ENCOUNTER — Ambulatory Visit (INDEPENDENT_AMBULATORY_CARE_PROVIDER_SITE_OTHER): Payer: Medicare HMO | Admitting: Gastroenterology

## 2022-04-13 ENCOUNTER — Encounter: Payer: Self-pay | Admitting: Gastroenterology

## 2022-04-13 ENCOUNTER — Other Ambulatory Visit (INDEPENDENT_AMBULATORY_CARE_PROVIDER_SITE_OTHER): Payer: Medicare HMO

## 2022-04-13 VITALS — BP 126/80 | HR 102 | Ht 60.0 in | Wt 151.0 lb

## 2022-04-13 DIAGNOSIS — R1011 Right upper quadrant pain: Secondary | ICD-10-CM | POA: Diagnosis not present

## 2022-04-13 DIAGNOSIS — K5909 Other constipation: Secondary | ICD-10-CM

## 2022-04-13 DIAGNOSIS — R110 Nausea: Secondary | ICD-10-CM

## 2022-04-13 DIAGNOSIS — K625 Hemorrhage of anus and rectum: Secondary | ICD-10-CM

## 2022-04-13 DIAGNOSIS — K219 Gastro-esophageal reflux disease without esophagitis: Secondary | ICD-10-CM

## 2022-04-13 DIAGNOSIS — K7460 Unspecified cirrhosis of liver: Secondary | ICD-10-CM | POA: Diagnosis not present

## 2022-04-13 LAB — COMPREHENSIVE METABOLIC PANEL
ALT: 13 U/L (ref 0–35)
AST: 19 U/L (ref 0–37)
Albumin: 4.1 g/dL (ref 3.5–5.2)
Alkaline Phosphatase: 91 U/L (ref 39–117)
BUN: 9 mg/dL (ref 6–23)
CO2: 29 mEq/L (ref 19–32)
Calcium: 9.2 mg/dL (ref 8.4–10.5)
Chloride: 105 mEq/L (ref 96–112)
Creatinine, Ser: 0.6 mg/dL (ref 0.40–1.20)
GFR: 88.82 mL/min (ref >60.00)
Glucose, Bld: 100 mg/dL — ABNORMAL HIGH (ref 70–99)
Potassium: 3.7 mEq/L (ref 3.5–5.1)
Sodium: 141 mEq/L (ref 135–145)
Total Bilirubin: 0.7 mg/dL (ref 0.2–1.2)
Total Protein: 6.8 g/dL (ref 6.0–8.3)

## 2022-04-13 LAB — CBC WITH DIFFERENTIAL/PLATELET
Basophils Absolute: 0 10*3/uL (ref 0.0–0.1)
Basophils Relative: 1.3 % (ref 0.0–3.0)
Eosinophils Absolute: 0.2 10*3/uL (ref 0.0–0.7)
Eosinophils Relative: 4.3 % (ref 0.0–5.0)
HCT: 36.6 % (ref 36.0–46.0)
Hemoglobin: 12.2 g/dL (ref 12.0–15.0)
Lymphocytes Relative: 33.5 % (ref 12.0–46.0)
Lymphs Abs: 1.2 10*3/uL (ref 0.7–4.0)
MCHC: 33.3 g/dL (ref 30.0–36.0)
MCV: 93.9 fl (ref 78.0–100.0)
Monocytes Absolute: 0.2 10*3/uL (ref 0.1–1.0)
Monocytes Relative: 4.8 % (ref 3.0–12.0)
Neutro Abs: 1.9 10*3/uL (ref 1.4–7.7)
Neutrophils Relative %: 56.1 % (ref 43.0–77.0)
Platelets: 142 10*3/uL — ABNORMAL LOW (ref 150.0–400.0)
RBC: 3.9 Mil/uL (ref 3.87–5.11)
RDW: 13 % (ref 11.5–15.5)
WBC: 3.5 10*3/uL — ABNORMAL LOW (ref 4.0–10.5)

## 2022-04-13 LAB — PROTIME-INR
INR: 1.1 ratio — ABNORMAL HIGH (ref 0.8–1.0)
Prothrombin Time: 11.7 s (ref 9.6–13.1)

## 2022-04-13 MED ORDER — HYDROCORTISONE ACETATE 25 MG RE SUPP: 25.0000 mg | Freq: Two times a day (BID) | RECTAL | 1 refills | Status: AC

## 2022-04-13 MED ORDER — PROMETHAZINE HCL 25 MG PO TABS: 25.0000 mg | ORAL_TABLET | Freq: Four times a day (QID) | ORAL | 2 refills | Status: DC | PRN

## 2022-04-13 NOTE — Progress Notes (Signed)
IMPRESSION and PLAN:    #1. Rectal bleeding likely d/t Hoids. Neg colon Jan 2021 except for 6 mm TA s/p polypectomy.  Mild sig div and int hoids. Rpt routine colon not indicated d/t age.  #2.  Chronic nausea with RUQ pain. Failed zofran. Better with phenergan. US showed gallstone 08/2019, neg HIDA for acute cholecystitis. Neg EGD 08/2021. Off narcotics. R/o gastroparesis or functional component.  #3. GERD with dysphagia (resolved after dil 08/2021) d/t distal eso stricture, 2 cm HH. Neg Bx for EoE. H/O food impaction 2003 s/p endoscopic disimpaction. Has assoc oropharyngeal dysphagia d/t ?Parkinson's.  Had PEJ 2012 (removed 2017).   #4. 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 Likely d/t NASH. No ETOH, neg Hep C. No varices on EGD 08/2021 or ascites on [place] 04/2021. CT AP June 2023 neg for any masses. No HE. Nl AFP  #5. Chronic constipation. Failed miralax, colace. Better with 3 dulcolax/day. Neg colon 06/2019. Occ rectal bleeding d/t hoids.  #6. Asymptomatic cholelithiasis. Neg HIDA 09/2019 for acute cholecystitis. Neg CT AP 11/2021   Plan: -Continue Protonix '40mg'$  po QD -Phenergan '25mg'$  po QID prn #45 2RF. Sedation and fall precautions were discussed. -CBC, CMP, AFP, PT INR. -HC suppostories BID (after BM, at qHS) x 10 days -Sitz baths BID x 7 days -Continue dulcolax. -Minimize pain medications. -Call in 2 weeks. If still with rectal bleeding, ref to Sx for EUA/hoidectomy -FU in 3 months.   HPI:    Chief Complaint:   Amy Moon is a 71 y.o. female  With CAD on Plavix, HTN, cellulitis along L knee replacement scar (recently treated with doxycycline), insomnia, unexplained fever (being considered for ID ref)  C/O Rectal bleeding x 1 month, intermittent, bright red, she brings in pictures mostly on the tissue paper.  Not mixed with the stool.  She does have a history of chronic  constipation.  Bms 1/3-4 days.  No melena or hematochezia.  She is currently off narcotics.  Dulcolax does help.  Nl CBC, CMP 11/2021  Nausea in AM. No Heartburn.  No vomiting on Phenergan.  She overall has a good appetite.  No weight loss.  No further dysphagia after dilation 08/2021  No jaundice dark urine or pale stools.  No abdominal distention.  Denies having any history of confusion or memory lapses.  No easy bruisability.   Nl CBC (except plt 149), CMP (alb 4.3), PT INR, AFP Nov 2022  Wt Readings from Last 3 Encounters:  04/13/22 151 lb (68.5 kg)  04/06/22 147 lb (66.7 kg)  12/13/21 146 lb 6.4 oz (66.4 kg)     Past GI procedures:  EGD 09/14/2021 - Benign-appearing esophageal stenosis. Dilated. - Small hiatal hernia. - A single gastric polyp. - No esophageal varices.  EGD: 07/16/2019 distal esophageal stricture s/p dilatation, grade A esophagitis, small hiatal hernia. Neg SB Bx for celiac, neg gastric Bx for HP.  Colonoscopy Jan 2021:  6 mm colonic polyp s/p polypectomy, mild sig div. Otherwise Nl to TI.  Bx- tubular adenoma.  Rpt not indicated d/t age.  CT AP 11/2021 without contrast 1. No acute findings identified within the abdomen. 2. Morphologic features of the liver compatible with cirrhosis. No focal liver abnormalities identified. 3. Stigmata of portal venous hypertension including esophageal varices and recanalization of the umbilical vein. 4. Gallstones. 5. Increase caliber of the common bile duct measures up to 8 mm. Previously  the CBD measured the same. No definite signs of choledocholithiasis. 6. Aortic Atherosclerosis (ICD10-I70.0).  CT AP with contrast 12/25/2019 1. No acute findings within the abdomen or pelvis. 2. Hepatic cirrhosis and findings of portal venous hypertension. No evidence of hepatic neoplasm.  No ascites.  Mild splenomegaly. 3. Cholelithiasis. No radiographic evidence of cholecystitis.  [place] 04/2021 1. Cholelithiasis with no findings of  acute cholecystitis. 2. Hepatic steatosis. Please note limited evaluation for focal hepatic masses in a patient with hepatic steatosis due to decreased penetration of the acoustic ultrasound waves. 3. Nonvisualized mid aorta.   [place] 08/2019 1. There is a 1.4 cm stone in the gallbladder. A positive Murphy's sign is reported. However, there is no wall thickening or pericholecystic fluid. Recommend clinical correlation. If there is concern for cholecystitis, recommend HIDA scan.  HIDA: neg 2. Probable hepatic steatosis. 3. No other abnormalities.   Past Medical History:  Diagnosis Date   Angina pectoris (HCC)    Chronic kidney disease    Drug-induced low platelet count    Duodenal ulcer    Granulomatous disease ([place])    Hepatic disease    Hypertension    Low vitamin D level    Neuropathy    Osteopenia    Parkinson's disease (HCC)        Splenomegaly a    Current Outpatient Medications  Medication Sig Dispense Refill   acetaminophen (TYLENOL) 325 MG tablet Take 650 mg by mouth every 6 (six) hours as needed for mild pain or moderate pain.     atorvastatin (LIPITOR) 10 MG tablet Take 1 tablet (10 mg total) by mouth daily. 90 tablet 2   bisacodyl (DULCOLAX) 5 MG EC tablet Take 15 mg by mouth at bedtime as needed for moderate constipation.     clopidogrel (PLAVIX) 75 MG tablet Take 1 tablet (75 mg total) by mouth daily. 90 tablet 2   diphenhydrAMINE (BENADRYL) 25 MG tablet Take 25 mg by mouth every 6 (six) hours as needed for allergies.     furosemide (LASIX) 40 MG tablet Take 60 mg once daily, 1 and 1/2 tablets daily 135 tablet 1   losartan (COZAAR) 25 MG tablet Take 25 mg by mouth daily.     nitroGLYCERIN (NITROSTAT) 0.4 MG SL tablet Place 0.4 mg under the tongue every 5 (five) minutes as needed for chest pain.      pantoprazole (PROTONIX) 40 MG tablet TAKE 1 TABLET BY MOUTH 2 TIMES DAILY. (Patient taking differently: Take 40 mg by mouth daily.) 60 tablet 5   potassium chloride  (KLOR-CON) 10 MEQ tablet TAKE 1 TABLET (10 MEQ TOTAL) BY MOUTH DAILY. 90 tablet 1   promethazine (PHENERGAN) 25 MG tablet Take 1 tablet (25 mg total) by mouth every 6 (six) hours as needed for nausea or vomiting. 45 tablet 2   ranolazine (RANEXA) 1000 MG SR tablet TAKE 1 TABLET BY MOUTH 2 TIMES DAILY. (Patient taking differently: Take 1,000 mg by mouth 2 (two) times daily.) 60 tablet 11   cyclobenzaprine (FLEXERIL) 10 MG tablet TAKE 1 TABLET BY MOUTH EVERY 8 HOURS AS NEEDED FOR MUSCLE SPASMS. (Patient taking differently: Take 10 mg by mouth 3 (three) times daily as needed for muscle spasms.) 60 tablet 1   diphenhydrAMINE (BENADRYL) 25 MG tablet Take 1 tablet (25 mg total) by mouth as directed. Take 1 tablet 1 hour before CT 1 tablet 0   fluticasone (CUTIVATE) 0.05 % cream Apply topically 2 (two) times daily. Apply topically 2 (two) times daily per  rectum for 10 days. (Patient taking differently: Apply 1 Application topically 2 (two) times daily. Apply topically 2 (two) times daily per rectum for 10 days.) 30 g 1   hydrocortisone (ANUSOL-HC) 2.5 % rectal cream Place 1 application. rectally 2 (two) times daily. For 10 days 30 g 1   isosorbide mononitrate (IMDUR) 60 MG 24 hr tablet Take 1 tablet (60 mg total) by mouth daily. 30 tablet 2   pregabalin (LYRICA) 50 MG capsule TAKE 1 CAPSULE BY MOUTH 3 TIMES DAILY. 90 capsule 0   Vitamin D, Ergocalciferol, (DRISDOL) 1.25 MG (50000 UNIT) CAPS capsule Take 1 capsule (50,000 Units total) by mouth every 7 (seven) days. 8 capsule 0   No current facility-administered medications for this visit.    Past Surgical History:  Procedure Laterality Date   ABDOMINAL HYSTERECTOMY     APPENDECTOMY     BACK SURGERY     thoracic-fractured    BACK SURGERY     lumbar - fractured   BREAST LUMPECTOMY WITH RADIOACTIVE SEED LOCALIZATION Right 08/01/2018   Procedure: RADIOACTIVE SEED GUIDED RIGHT BREAST LUMPECTOMY;  Surgeon: Coralie Keens, MD;  Location: [facility];  Service:  General;  Laterality: Right;   COLONOSCOPY     ESOPHAGOGASTRODUODENOSCOPY  05/20/2008   Mild to moderate esophagitis. Otherwise normal EGD.    EYE SURGERY Left    cataract surgery with lens implant   GASTROSTOMY     peptic ulcer - peg tube placement   TOTAL KNEE ARTHROPLASTY Left 01/21/2021   Procedure: LEFT TOTAL KNEE ARTHROPLASTY;  Surgeon: Mcarthur Rossetti, MD;  Location: [facility];  Service: Orthopedics;  Laterality: Left;  Need RNFA   UPPER GASTROINTESTINAL ENDOSCOPY      Family History  Problem Relation Age of Onset   Cancer Mother        ovarian   Hyperlipidemia Mother    Alzheimer's disease Mother    Arthritis Mother        rheumatoid   Cancer Father        lung   Parkinson's disease Father    Alzheimer's disease Father    ALS Sister    Cancer Maternal Aunt        pancreatic, lung, liver,breast   Breast cancer Maternal Aunt    Cancer Maternal Uncle        pancreatic, liver, brain   Colon cancer Maternal Uncle    Heart attack Paternal Aunt    Stroke Paternal Aunt    Stomach cancer Paternal Aunt    Heart attack Paternal Uncle    Stroke Paternal Grandmother    Heart attack Paternal Grandfather    Arthritis Sister        rheumatoid   Huntington's disease Daughter        presumed inherited from father per patient   Colon cancer Maternal Uncle    Pancreatic cancer Maternal Uncle    Liver disease Maternal Uncle    Breast cancer Maternal Aunt    Breast cancer Maternal Aunt    Esophageal cancer Neg Hx    Rectal cancer Neg Hx     Social History   Tobacco Use   Smoking status: Former    Packs/day: 1.00    Years: 22.00    Total pack years: 22.00    Types: Cigarettes    Quit date: 06/26/1986    Years since quitting: 35.8   Smokeless tobacco: Never  Vaping Use   Vaping Use: Never used  Substance Use Topics   Alcohol use: No  Drug use: No    Allergies  Allergen Reactions   Iodinated Contrast Media Hives   Nsaids Other (See Comments)    History of  bleeding ulcers   Latex Hives and Rash   Sinemet [Carbidopa W-Levodopa] Nausea And Vomiting   Inderal [Propranolol] Other (See Comments)    Unknown   Indomethacin Other (See Comments)    Unknown   Penicillin G Other (See Comments)    Unknown   Pneumococcal Vaccines Other (See Comments)    Caused "pneumonia"   Scopolamine Other (See Comments)    The patch, caused her to pass out / change in mental status   Tape Other (See Comments)    Unknown     Review of Systems: All systems reviewed and negative except where noted in HPI.    Physical Exam:    Today's Vitals   04/13/22 1127  BP: 126/80  Pulse: (!) 102  Weight: 151 lb (68.5 kg)  Height: 5' (1.524 m)   Body mass index is 29.49 kg/m. Gen: awake, alert, NAD HEENT: anicteric, no pallor CV: RRR, no mrg Pulm: CTA b/l Abd: soft, NT/ND, +BS throughout Rectal exam (presence of Brooke)-internal hemorrhoids with some red vale markings. Ext: no c/c/e Neuro: nonfocal     Dequarius Jeffries,MD 04/13/2022, 11:39 AM   CC Lorrene Reid, PA-C

## 2022-04-13 NOTE — Patient Instructions (Addendum)
_______________________________________________________  If you are age 73 or older, your body mass index should be between 23-30. Your Body mass index is 29.49 kg/m. If this is out of the aforementioned range listed, please consider follow up with your Primary Care Provider.  If you are age 33 or younger, your body mass index should be between 19-25. Your Body mass index is 29.49 kg/m. If this is out of the aformentioned range listed, please consider follow up with your Primary Care Provider.   ________________________________________________________  The [facility] providers would like to encourage you to use [facility] to communicate with providers for non-urgent requests or questions.  Due to long hold times on the telephone, sending your provider a message by [facility] may be a faster and more efficient way to get a response.  Please allow 48 business hours for a response.  Please remember that this is for non-urgent requests.  _______________________________________________________  Your provider has requested that you go to the basement level for lab work before leaving today. Press "B" on the elevator. The lab is located at the first door on the left as you exit the elevator.  We have sent the following medications to your pharmacy for you to pick up at your convenience: Phenergan Hydrocortisone supp  Continue dulcolax and Protonix  Minimize pain medications.  Please call Dr. Leland Her nurse  in 2 weeks at [phone]  to let her now how you are doing.   Please follow up in 3 months. Give [place] a call at [phone] to schedule an appointment.   Do sitz baths 2 times for 7 days                         How to Take a Sitz Bath A sitz bath is a warm water bath that may be used to care for your rectum, genital area, or the area between your rectum and genitals (perineum). In a sitz bath, the water only comes up to your hips and covers your buttocks. A sitz bath may be done in a bathtub or  with a portable sitz bath that fits over the toilet. Your health care provider may recommend a sitz bath to help: Relieve pain and discomfort after delivering a baby. Relieve pain and itching from hemorrhoids or anal fissures. Relieve pain after certain surgeries. Relax muscles that are sore or tight. How to take a sitz bath Take 2-4 sitz baths a day, or as many as told by your health care provider. Bathtub sitz bath To take a sitz bath in a bathtub: Partially fill a bathtub with warm water. The water should be deep enough to cover your hips and buttocks when you are sitting in the bathtub. Follow your health care provider's instructions if you are told to put medicine in the water. Sit in the water. Open the bathtub drain a little, and leave it open during your bath. Turn on the warm water again, enough to replace the water that is draining out. Keep the water running throughout your bath. This helps keep the water at the right level and temperature. Soak in the water for 15-20 minutes, or as long as told by your health care provider. When you are done, be careful when you stand up. You may feel dizzy. After the sitz bath, pat yourself dry. Do not rub your skin to dry it.  Over-the-toilet sitz bath To take a sitz bath with an over-the-toilet basin: Follow the manufacturer's instructions. Fill the basin  with warm water. Follow your health care provider's instructions if you were told to put medicine in the water. Sit on the seat. Make sure the water covers your buttocks and perineum. Soak in the water for 15-20 minutes, or as long as told by your health care provider. After the sitz bath, pat yourself dry. Do not rub your skin to dry it. Clean and dry the basin between uses. Discard the basin if it cracks, or according to the manufacturer's instructions.  Contact a health care provider if: Your pain or itching gets worse. Stop doing sitz baths if your symptoms get worse. You have new  symptoms. Stop doing sitz baths until you talk with your health care provider. Summary A sitz bath is a warm water bath in which the water only comes up to your hips and covers your buttocks. Your health care provider may recommend a sitz bath to help relieve pain and discomfort after delivering a baby, relieve pain and itching from hemorrhoids or anal fissures, relieve pain after certain surgeries, or help to relax muscles that are sore or tight. Take 2-4 sitz baths a day, or as many as told by your health care provider. Soak in the water for 15-20 minutes. Stop doing sitz baths if your symptoms get worse. This information is not intended to replace advice given to you by your health care provider. Make sure you discuss any questions you have with your health care provider. Document Revised: 09/13/2021 Document Reviewed: 09/13/2021 Elsevier Patient Education  [place].

## 2022-04-14 LAB — AFP TUMOR MARKER: AFP-Tumor Marker: 4.6 ng/mL

## 2022-04-18 ENCOUNTER — Telehealth: Payer: Self-pay

## 2022-04-18 ENCOUNTER — Other Ambulatory Visit (HOSPITAL_COMMUNITY): Payer: Self-pay

## 2022-04-18 NOTE — Telephone Encounter (Signed)
Pharmacy Patient Advocate Encounter  Received notification from [place] that the request for prior authorization for Promethazine has been denied due to  .     This determination is currently being reviewed for appeal.    Joneen Boers, [place] Patient Advocate Specialist [facility] Patient Advocate Team Direct Number: [phone] Fax: [phone]

## 2022-04-18 NOTE — Telephone Encounter (Signed)
Pharmacy Patient Advocate Encounter  Received notification from [place] that the request for prior authorization for Hydrocortisone Acetate '25MG'$  suppositories has been denied.  The medication you have requested is not covered by Medicare Part D Law.

## 2022-04-18 NOTE — Telephone Encounter (Signed)
Pharmacy Patient Advocate Encounter   Received notification from [place] that prior authorization for Promethazine HCl '25MG'$  tablets is required/requested.   PA submitted on 04/18/2022 to [facility] via [place] Status is pending

## 2022-04-19 NOTE — Telephone Encounter (Signed)
Please see note below and advise  

## 2022-04-25 ENCOUNTER — Encounter: Payer: Self-pay | Admitting: Specialist

## 2022-04-25 DIAGNOSIS — G894 Chronic pain syndrome: Secondary | ICD-10-CM | POA: Diagnosis not present

## 2022-04-25 DIAGNOSIS — R69 Illness, unspecified: Secondary | ICD-10-CM | POA: Diagnosis not present

## 2022-04-25 DIAGNOSIS — M47812 Spondylosis without myelopathy or radiculopathy, cervical region: Secondary | ICD-10-CM | POA: Diagnosis not present

## 2022-04-25 DIAGNOSIS — M47816 Spondylosis without myelopathy or radiculopathy, lumbar region: Secondary | ICD-10-CM | POA: Diagnosis not present

## 2022-04-25 NOTE — Patient Instructions (Signed)
Plan: Knees are suffering from osteoarthritis, only real proven treatments are Weight loss, NSIADs like diclofenac and exercise. Well padded shoes help. Ice the knee that is suffering from osteoarthritis, only real proven treatments are Ice the knee 2-3 times a day 15-20 mins at a time.3 times a day 15-20 mins at a time. Hot showers in the AM.  Injection with steroid may be of benefit. Hemp CBD capsules, amazon.com 5,000-7,000 mg per bottle, 60 capsules per bottle, take one capsule twice a day. Cane in the left hand to use with left leg weight bearing. Avoid overhead lifting and overhead use of the arms. Do not lift greater than 5 lbs. Adjust head rest in vehicle to prevent hyperextension if rear ended. Take extra precautions to avoid falling, including use of a cane if you feel weak. Consider repeat SNRB or a C7-T1 ESI if pain persists. Unfortunately surgical treatment would require long cervical fusion. Continue to take a fair amount of pain medication for her pain and pain management is appropriate.

## 2022-04-25 NOTE — Progress Notes (Signed)
Office Visit Note   Patient: Amy Moon           Date of Birth: 10/04/1950           MRN: [id number] Visit Date: 08/21/2019              Requested by: [name], NP [address] [place],  [place] [address] PCP: Lorrene Reid, PA-C   Assessment & Plan: Visit Diagnoses:  1. Cervicalgia   2. Cervical spondylosis without myelopathy   3. Unilateral primary osteoarthritis, left knee   4. Unilateral primary osteoarthritis, right knee     Plan: Knees are suffering from osteoarthritis, only real proven treatments are Weight loss, NSIADs like diclofenac and exercise. Well padded shoes help. Ice the knee that is suffering from osteoarthritis, only real proven treatments are Ice the knee 2-3 times a day 15-20 mins at a time.3 times a day 15-20 mins at a time. Hot showers in the AM.  Injection with steroid may be of benefit. Hemp CBD capsules, amazon.com 5,000-7,000 mg per bottle, 60 capsules per bottle, take one capsule twice a day. Cane in the left hand to use with left leg weight bearing. Avoid overhead lifting and overhead use of the arms. Do not lift greater than 5 lbs. Adjust head rest in vehicle to prevent hyperextension if rear ended. Take extra precautions to avoid falling, including use of a cane if you feel weak. Consider repeat SNRB or a C7-T1 ESI if pain persists. Unfortunately surgical treatment would require long cervical fusion. Continue to take a fair amount of pain medication for her pain and pain management is appropriate.  Follow-Up Instructions: No follow-ups on file.    Follow-Up Instructions: No follow-ups on file.   Orders:  No orders of the defined types were placed in this encounter.  No orders of the defined types were placed in this encounter.     Procedures: No procedures performed   Clinical Data: No additional findings.   Subjective: Chief Complaint  Patient presents with   Neck - Follow-up    Had injection in neck with Dr.  Ernestina Patches said it was good while it was numb    71 year old right handed female with history of cervical spondylosis that is mainly C3-4 and C4-5 with neck pain more than upper extremity complaints. She also has severe bilateral knee osteoarhritis and has undergone previous cervical nerve block with relief of pain temporarily. No bowel or bladder dysfunction. She is able to stand and ambulate with no ataxia or falling. Has bilateral knee stiffness and pain and is taking narcotic pain medications to relieve her arthritis pain.     Review of Systems  Constitutional: Negative.   HENT: Negative.    Eyes: Negative.   Respiratory: Negative.    Cardiovascular: Negative.   Gastrointestinal: Negative.   Endocrine: Negative.   Genitourinary: Negative.   Musculoskeletal: Negative.   Skin: Negative.   Allergic/Immunologic: Negative.   Neurological: Negative.   Hematological: Negative.   Psychiatric/Behavioral: Negative.       Objective: Vital Signs: BP (!) 158/88 (BP Location: Left Arm, Patient Position: Sitting)   Pulse 91   Ht 5' (1.524 m)   Wt 160 lb (72.6 kg)   BMI 31.25 kg/m   Physical Exam Constitutional:      Appearance: She is well-developed.  HENT:     Head: Normocephalic and atraumatic.  Eyes:     Pupils: Pupils are equal, round, and reactive to light.  Pulmonary:  Effort: Pulmonary effort is normal.     Breath sounds: Normal breath sounds.  Abdominal:     General: Bowel sounds are normal.     Palpations: Abdomen is soft.  Musculoskeletal:     Cervical back: Normal range of motion and neck supple.     Lumbar back: Negative right straight leg raise test and negative left straight leg raise test.  Skin:    General: Skin is warm and dry.  Neurological:     Mental Status: She is alert and oriented to person, place, and time.  Psychiatric:        Behavior: Behavior normal.        Thought Content: Thought content normal.        Judgment: Judgment normal.    Back  Exam   Tenderness  The patient is experiencing tenderness in the cervical.  Range of Motion  Extension:  50 abnormal  Flexion:  40 abnormal  Lateral bend right:  60  Lateral bend left:  40  Rotation right:  50  Rotation left:  50   Muscle Strength  Right Quadriceps:  5/5  Left Quadriceps:  5/5  Right Hamstrings:  5/5  Left Hamstrings:  5/5   Tests  Straight leg raise right: negative Straight leg raise left: negative  Reflexes  Patellar:  0/4 Achilles:  0/4  Other  Toe walk: normal Heel walk: normal  Comments:  No focal motor deficit in either arm or leg.  DJD both knees with varus deformity.      Specialty Comments:  No specialty comments available.  Imaging: No results found.   PMFS History: Patient Active Problem List   Diagnosis Date Noted   Cellulitis of left lower extremity 03/15/2021   Atopic dermatitis 03/15/2021   Encounter for screening for COVID-19 03/15/2021   Status post left knee replacement 01/22/2021   Status post total left knee replacement 01/21/2021   Unilateral primary osteoarthritis, left knee 11/30/2020   Dyslipidemia 04/02/2020   Splenomegaly    Portal hypertension (HCC)    Pneumonia    Osteoporosis    Osteopenia    Neuropathy    Low vitamin D level    History of kidney stones    Hiatal hernia    Hepatic disease    Granulomatous disease (HCC)    GERD (gastroesophageal reflux disease)    Enlarged thyroid    Duodenal ulcer    Drug-induced low platelet count    Depression    Clotting disorder (HCC)    Cirrhosis ([place])    Chronic kidney disease    Cataract    Cancer ([place])    Asthma    Arthritis    Angina pectoris ([place])    Anemia    Allergy    Stage 3 chronic kidney disease ([place]) 10/21/2019   History of smoking 25-50 pack years- quit age 12, smoked 2ppd from 71 yo-71 yo. 10/21/2019   History of surgery- pt has several Sx- no personal issues wiht anesthesia  10/21/2019   Sleep disturbance 10/21/2019   History of  exposure to asbestos 10/21/2019   Family history of adverse reaction to anesthesia    Dyspnea on exertion 10/17/2019   Encounter for Medicare annual wellness exam 05/09/2018   Vitamin D deficiency 11/05/2017   Encounter for hepatitis C virus screening test for high risk patient 11/05/2017   Dysphagia 09/03/2017   Thrombocytopenia ([place]) 09/03/2017   Healthcare maintenance 07/30/2017   Chronic pain syndrome 07/30/2017   Chest pain 07/30/2017  Parkinson's disease 07/30/2017   Hypertension 07/30/2017   Severe episode of recurrent major depressive disorder, without psychotic features ([place]) 05/15/2016   Tremor of both hands 05/15/2016   Severe obesity (BMI >= 40) ([place]) 05/15/2016   Nausea 03/01/2016   Irritant contact dermatitis 08/25/2015   Past Medical History:  Diagnosis Date   Allergy    Anemia    Angina pectoris ([place])    Arthritis    Asthma    years ago   Bronchitis 06/03/2018   Cancer ([place])    squamous cell carcinoma, nose   Cataract    Chest pain 07/30/2017   Chronic kidney disease    possibly per pt   Chronic pain syndrome 07/30/2017   Cirrhosis ([place])    Clotting disorder (HCC)    platelets are low   Coronary artery disease 04/02/2020   Depression    Drug-induced low platelet count    Duodenal ulcer    Dyslipidemia 04/02/2020   Dysphagia 09/03/2017   Dyspnea on exertion 10/17/2019   Encounter for hepatitis C virus screening test for high risk patient 11/05/2017   Encounter for Medicare annual wellness exam 05/09/2018   Enlarged thyroid    Family history of adverse reaction to anesthesia    father had difficulty waking up and breathing on his own after surgery   GERD (gastroesophageal reflux disease)    Granulomatous disease ([place])    Healthcare maintenance 07/30/2017   Hepatic disease    Hiatal hernia    History of exposure to asbestos 10/21/2019   History of kidney stones    History of smoking 25-50 pack years- quit age 37, smoked 2ppd from 71 yo-71 yo.  10/21/2019   History of surgery- pt has several Sx- no personal issues wiht anesthesia  10/21/2019   Hypertension    Irritant contact dermatitis 08/25/2015   Low vitamin D level    Migraine    Nausea 03/01/2016   Neuropathy    Osteopenia    Osteoporosis    Parkinson's disease    Peripheral vascular disease (HCC)    Pneumonia    PONV (postoperative nausea and vomiting)    Portal hypertension (HCC)    Severe episode of recurrent major depressive disorder, without psychotic features ([place]) 05/15/2016   Severe obesity (BMI >= 40) ([place]) 05/15/2016   Sleep disturbance 10/21/2019   Splenomegaly a   Stage 3 chronic kidney disease ([place]) 10/21/2019   Thrombocytopenia ([place]) 09/03/2017   Tremor of both hands 05/15/2016   Vitamin D deficiency 11/05/2017    Family History  Problem Relation Age of Onset   Cancer Mother        ovarian   Hyperlipidemia Mother    Alzheimer's disease Mother    Arthritis Mother        rheumatoid   Cancer Father        lung   Parkinson's disease Father    Alzheimer's disease Father    ALS Sister    Cancer Maternal Aunt        pancreatic, lung, liver,breast   Breast cancer Maternal Aunt    Cancer Maternal Uncle        pancreatic, liver, brain   Colon cancer Maternal Uncle    Heart attack Paternal Aunt    Stroke Paternal Aunt    Stomach cancer Paternal Aunt    Heart attack Paternal Uncle    Stroke Paternal Grandmother    Heart attack Paternal Grandfather    Arthritis Sister  rheumatoid   H[place]'s disease Daughter        presumed inherited from father per patient   Colon cancer Maternal Uncle    Pancreatic cancer Maternal Uncle    Liver disease Maternal Uncle    Breast cancer Maternal Aunt    Breast cancer Maternal Aunt    Esophageal cancer Neg Hx    Rectal cancer Neg Hx     Past Surgical History:  Procedure Laterality Date   ABDOMINAL HYSTERECTOMY     APPENDECTOMY     BACK SURGERY     thoracic-fractured    BACK SURGERY     lumbar  - fractured   BREAST LUMPECTOMY WITH RADIOACTIVE SEED LOCALIZATION Right 08/01/2018   Procedure: RADIOACTIVE SEED GUIDED RIGHT BREAST LUMPECTOMY;  Surgeon: Coralie Keens, MD;  Location: [facility];  Service: General;  Laterality: Right;   COLONOSCOPY     ESOPHAGOGASTRODUODENOSCOPY  05/20/2008   Mild to moderate esophagitis. Otherwise normal EGD.    EYE SURGERY Left    cataract surgery with lens implant   GASTROSTOMY     peptic ulcer - peg tube placement   TOTAL KNEE ARTHROPLASTY Left 01/21/2021   Procedure: LEFT TOTAL KNEE ARTHROPLASTY;  Surgeon: Mcarthur Rossetti, MD;  Location: [facility];  Service: Orthopedics;  Laterality: Left;  Need RNFA   UPPER GASTROINTESTINAL ENDOSCOPY     Social History   Occupational History   Not on file  Tobacco Use   Smoking status: Former    Packs/day: 1.00    Years: 22.00    Total pack years: 22.00    Types: Cigarettes    Quit date: 06/26/1986    Years since quitting: 35.8   Smokeless tobacco: Never  Vaping Use   Vaping Use: Never used  Substance and Sexual Activity   Alcohol use: No   Drug use: No   Sexual activity: Not Currently    Birth control/protection: None, Surgical

## 2022-04-28 ENCOUNTER — Telehealth: Payer: Self-pay | Admitting: Gastroenterology

## 2022-04-28 NOTE — Telephone Encounter (Signed)
Pt stated that her symptoms of rectal bleeding and temp are not getting any better: Pt was rescheduled for a sooner appointment to see Dr. Lyndel Safe: Pt scheduled for 05/05/2022  at 10:20 AM: Pt made aware: Pt verbalized understanding with all questions answered.

## 2022-04-28 NOTE — Telephone Encounter (Signed)
Patient called states she is not getting better from her rectal bleeding and nausea systems, is highly concerned and seeking advice. Also stated she is still having her fevers, she is scheduled for 05/30/2022

## 2022-05-01 NOTE — Telephone Encounter (Signed)
Spoke with Pharmacist about pt medication: Pharmacist stated that pt recently transferred medications to [facility] at [facility]: Left message for pt to call back:

## 2022-05-01 NOTE — Telephone Encounter (Signed)
Never heard of this before-promethazine would require precert She did just get it from the pharmacy without insurance-should not cost much RG

## 2022-05-02 NOTE — Telephone Encounter (Signed)
Pt stated that she has not had an issues getting the promethazine: Pt states that she has the medication at home and has not had any issues getting the medication : Pt verbalized understanding with all questions answered.

## 2022-05-05 ENCOUNTER — Ambulatory Visit: Payer: Medicare HMO | Admitting: Gastroenterology

## 2022-05-15 ENCOUNTER — Ambulatory Visit: Payer: Medicare HMO | Attending: Cardiology | Admitting: Cardiology

## 2022-05-15 ENCOUNTER — Encounter: Payer: Self-pay | Admitting: Cardiology

## 2022-05-15 VITALS — BP 130/70 | HR 87 | Ht 60.0 in | Wt 154.0 lb

## 2022-05-15 DIAGNOSIS — E785 Hyperlipidemia, unspecified: Secondary | ICD-10-CM

## 2022-05-15 DIAGNOSIS — K7460 Unspecified cirrhosis of liver: Secondary | ICD-10-CM

## 2022-05-15 DIAGNOSIS — R072 Precordial pain: Secondary | ICD-10-CM | POA: Diagnosis not present

## 2022-05-15 DIAGNOSIS — R0609 Other forms of dyspnea: Secondary | ICD-10-CM | POA: Diagnosis not present

## 2022-05-15 DIAGNOSIS — I209 Angina pectoris, unspecified: Secondary | ICD-10-CM

## 2022-05-15 MED ORDER — RANOLAZINE ER 1000 MG PO TB12: 1000.0000 mg | ORAL_TABLET | Freq: Two times a day (BID) | ORAL | 11 refills | Status: DC

## 2022-05-15 NOTE — Addendum Note (Signed)
Addended by: Jacobo Forest D on: 05/15/2022 01:48 PM   Modules accepted: Orders

## 2022-05-15 NOTE — Progress Notes (Signed)
Cardiology Office Note:    Date:  05/15/2022   ID:  Amy Moon, DOB 1951-05-31, MRN [id number]  PCP:  Lorrene Reid, PA-C  Cardiologist:  Jenne Campus, MD    Referring MD: Lorrene Reid, PA-C   Chief Complaint  Patient presents with   chest discomfort   Hypertension   Fatigue    History of Present Illness:    Amy Moon is a 71 y.o. female   with past medical history significant for coronary artery disease, she did have coronary CT angio done year ago which showed 65% stenosis of LAD however hemodynamically insignificant, she also had some issue with the right coronary artery which is noncritical.  She also had history of smoking, asbestos exposure, dyslipidemia, portal hypertension, cirrhosis of the liver.  She comes today 2 months of follow-up.  Overall she is doing well.  She complained of having some chest pain due to usually sharp short lasting not related to exercise.  I did try to put her on Imdur however she developed severe headache had to stop it.  She is taking ranolazine and she is telling me that probably helps a little bit.  Past Medical History:  Diagnosis Date   Allergy    Anemia    Angina pectoris ([place])    Arthritis    Asthma    years ago   Bronchitis 06/03/2018   Cancer ([place])    squamous cell carcinoma, nose   Cataract    Chest pain 07/30/2017   Chronic kidney disease    possibly per pt   Chronic pain syndrome 07/30/2017   Cirrhosis ([place])    Clotting disorder (HCC)    platelets are low   Coronary artery disease 04/02/2020   Depression    Drug-induced low platelet count    Duodenal ulcer    Dyslipidemia 04/02/2020   Dysphagia 09/03/2017   Dyspnea on exertion 10/17/2019   Encounter for hepatitis C virus screening test for high risk patient 11/05/2017   Encounter for Medicare annual wellness exam 05/09/2018   Enlarged thyroid    Family history of adverse reaction to anesthesia    father had difficulty waking up and breathing on his  own after surgery   GERD (gastroesophageal reflux disease)    Granulomatous disease ([place])    Healthcare maintenance 07/30/2017   Hepatic disease    Hiatal hernia    History of exposure to asbestos 10/21/2019   History of kidney stones    History of smoking 25-50 pack years- quit age 34, smoked 2ppd from 71 yo-71 yo. 10/21/2019   History of surgery- pt has several Sx- no personal issues wiht anesthesia  10/21/2019   Hypertension    Irritant contact dermatitis 08/25/2015   Low vitamin D level    Migraine    Nausea 03/01/2016   Neuropathy    Osteopenia    Osteoporosis    Parkinson's disease    Peripheral vascular disease ([place])    Pneumonia    PONV (postoperative nausea and vomiting)    Portal hypertension (HCC)    Severe episode of recurrent major depressive disorder, without psychotic features ([place]) 05/15/2016   Severe obesity (BMI >= 40) ([place]) 05/15/2016   Sleep disturbance 10/21/2019   Splenomegaly a   Stage 3 chronic kidney disease ([place]) 10/21/2019   Thrombocytopenia ([place]) 09/03/2017   Tremor of both hands 05/15/2016   Vitamin D deficiency 11/05/2017    Past Surgical History:  Procedure Laterality Date   ABDOMINAL HYSTERECTOMY  APPENDECTOMY     BACK SURGERY     thoracic-fractured    BACK SURGERY     lumbar - fractured   BREAST LUMPECTOMY WITH RADIOACTIVE SEED LOCALIZATION Right 08/01/2018   Procedure: RADIOACTIVE SEED GUIDED RIGHT BREAST LUMPECTOMY;  Surgeon: Coralie Keens, MD;  Location: [facility];  Service: General;  Laterality: Right;   COLONOSCOPY     ESOPHAGOGASTRODUODENOSCOPY  05/20/2008   Mild to moderate esophagitis. Otherwise normal EGD.    EYE SURGERY Left    cataract surgery with lens implant   GASTROSTOMY     peptic ulcer - peg tube placement   TOTAL KNEE ARTHROPLASTY Left 01/21/2021   Procedure: LEFT TOTAL KNEE ARTHROPLASTY;  Surgeon: Mcarthur Rossetti, MD;  Location: [facility];  Service: Orthopedics;  Laterality: Left;  Need RNFA   UPPER  GASTROINTESTINAL ENDOSCOPY      Current Medications: Current Meds  Medication Sig   acetaminophen (TYLENOL) 325 MG tablet Take 650 mg by mouth every 6 (six) hours as needed for mild pain or moderate pain.   atorvastatin (LIPITOR) 10 MG tablet Take 1 tablet (10 mg total) by mouth daily.   bisacodyl (DULCOLAX) 5 MG EC tablet Take 15 mg by mouth at bedtime as needed for moderate constipation.   clopidogrel (PLAVIX) 75 MG tablet Take 1 tablet (75 mg total) by mouth daily.   diphenhydrAMINE (BENADRYL) 25 MG tablet Take 25 mg by mouth every 6 (six) hours as needed for allergies.   furosemide (LASIX) 40 MG tablet Take 60 mg once daily, 1 and 1/2 tablets daily (Patient taking differently: Take 60 mg by mouth daily. Take 60 mg once daily, 1 and 1/2 tablets daily)   hydrocortisone (ANUSOL-HC) 25 MG suppository Place 1 suppository (25 mg total) rectally 2 (two) times daily. After bowel movement and at bedtime   isosorbide mononitrate (IMDUR) 60 MG 24 hr tablet Take 1 tablet (60 mg total) by mouth daily.   losartan (COZAAR) 25 MG tablet Take 25 mg by mouth daily.   nitroGLYCERIN (NITROSTAT) 0.4 MG SL tablet Place 0.4 mg under the tongue every 5 (five) minutes as needed for chest pain.    pantoprazole (PROTONIX) 40 MG tablet TAKE 1 TABLET BY MOUTH 2 TIMES DAILY. (Patient taking differently: Take 40 mg by mouth daily.)   potassium chloride (KLOR-CON) 10 MEQ tablet TAKE 1 TABLET (10 MEQ TOTAL) BY MOUTH DAILY.   pregabalin (LYRICA) 50 MG capsule TAKE 1 CAPSULE BY MOUTH 3 TIMES DAILY.   promethazine (PHENERGAN) 25 MG tablet Take 1 tablet (25 mg total) by mouth every 6 (six) hours as needed for nausea or vomiting.   ranolazine (RANEXA) 1000 MG SR tablet TAKE 1 TABLET BY MOUTH 2 TIMES DAILY. (Patient taking differently: Take 1,000 mg by mouth 2 (two) times daily.)   Vitamin D, Ergocalciferol, (DRISDOL) 1.25 MG (50000 UNIT) CAPS capsule Take 1 capsule (50,000 Units total) by mouth every 7 (seven) days.    [DISCONTINUED] hydrocortisone (ANUSOL-HC) 2.5 % rectal cream Place 1 application. rectally 2 (two) times daily. For 10 days     Allergies:   Iodinated contrast media, Nsaids, Latex, Sinemet [carbidopa w-levodopa], Inderal [propranolol], Indomethacin, Penicillin g, Pneumococcal vaccines, Scopolamine, and Tape   Social History   Socioeconomic History   Marital status: Legally Separated    Spouse name: Not on file   Number of children: 3   Years of education: Not on file   Highest education level: Not on file  Occupational History   Not on file  Tobacco  Use   Smoking status: Former    Packs/day: 1.00    Years: 22.00    Total pack years: 22.00    Types: Cigarettes    Quit date: 06/26/1986    Years since quitting: 35.9   Smokeless tobacco: Never  Vaping Use   Vaping Use: Never used  Substance and Sexual Activity   Alcohol use: No   Drug use: No   Sexual activity: Not Currently    Birth control/protection: None, Surgical  Other Topics Concern   Not on file  Social History Narrative   Not on file   Social Determinants of Health   Financial Resource Strain: Not on file  Food Insecurity: Not on file  Transportation Needs: Not on file  Physical Activity: Not on file  Stress: Not on file  Social Connections: Not on file     Family History: The patient's family history includes ALS in her sister; Alzheimer's disease in her father and mother; Arthritis in her mother and sister; Breast cancer in her maternal aunt, maternal aunt, and maternal aunt; Cancer in her father, maternal aunt, maternal uncle, and mother; Colon cancer in her maternal uncle and maternal uncle; Heart attack in her paternal aunt, paternal grandfather, and paternal uncle; Huntington's disease in her daughter; Hyperlipidemia in her mother; Liver disease in her maternal uncle; Pancreatic cancer in her maternal uncle; Parkinson's disease in her father; Stomach cancer in her paternal aunt; Stroke in her paternal aunt and  paternal grandmother. There is no history of Esophageal cancer or Rectal cancer. ROS:   Please see the history of present illness.    All 14 point review of systems negative except as described per history of present illness  EKGs/Labs/Other Studies Reviewed:      Recent Labs: 10/12/2021: TSH 0.364 12/13/2021: NT-Pro BNP <36 04/13/2022: ALT 13; BUN 9; Creatinine, Ser 0.60; Hemoglobin 12.2; Platelets 142.0; Potassium 3.7; Sodium 141  Recent Lipid Panel    Component Value Date/Time   CHOL 140 08/31/2021 1111   TRIG 81 08/31/2021 1111   HDL 61 08/31/2021 1111   CHOLHDL 2.3 08/31/2021 1111   LDLCALC 63 08/31/2021 1111    Physical Exam:    VS:  BP 130/70 (BP Location: Right Arm, Patient Position: Sitting)   Pulse 87   Ht 5' (1.524 m)   Wt 154 lb (69.9 kg)   SpO2 98%   BMI 30.08 kg/m     Wt Readings from Last 3 Encounters:  05/15/22 154 lb (69.9 kg)  04/13/22 151 lb (68.5 kg)  04/06/22 147 lb (66.7 kg)     GEN:  Well nourished, well developed in no acute distress HEENT: Normal NECK: No JVD; No carotid bruits LYMPHATICS: No lymphadenopathy CARDIAC: RRR, no murmurs, no rubs, no gallops RESPIRATORY:  Clear to auscultation without rales, wheezing or rhonchi  ABDOMEN: Soft, non-tender, non-distended MUSCULOSKELETAL:  No edema; No deformity  SKIN: Warm and dry LOWER EXTREMITIES: no swelling NEUROLOGIC:  Alert and oriented x 3 PSYCHIATRIC:  Normal affect   ASSESSMENT:    1. Angina pectoris ([place])   2. Hepatic cirrhosis, unspecified hepatic cirrhosis type, unspecified whether ascites present ([place])   3. Dyslipidemia    PLAN:    In order of problems listed above:  Angina pectoris.  Chest pain she describes is somewhat atypical.  She is on antiplatelet therapy which I will continue.  Unable to tolerate Imdur on ranolazine with good response continue present management. Hepatic cirrhosis.  That being followed by GI. Dyslipidemia I did review  K PN which only dated from March  of this year with LDL 63 HDL 61.  She is taking Lipitor 10 which I will continue.  Liver function test were normal. I will ask her to have echocardiogram done to recheck left ventricle ejection fraction she is complaining being weak tired and having shortness of breath that need to be clarified make sure her ejection fraction is preserved   Medication Adjustments/Labs and Tests Ordered: Current medicines are reviewed at length with the patient today.  Concerns regarding medicines are outlined above.  No orders of the defined types were placed in this encounter.  Medication changes: No orders of the defined types were placed in this encounter.   Signed, Park Liter, MD, [facility] 05/15/2022 1:36 PM    [facility]

## 2022-05-15 NOTE — Patient Instructions (Signed)

## 2022-05-25 DIAGNOSIS — M47812 Spondylosis without myelopathy or radiculopathy, cervical region: Secondary | ICD-10-CM | POA: Diagnosis not present

## 2022-05-25 DIAGNOSIS — M47816 Spondylosis without myelopathy or radiculopathy, lumbar region: Secondary | ICD-10-CM | POA: Diagnosis not present

## 2022-05-25 DIAGNOSIS — Z79891 Long term (current) use of opiate analgesic: Secondary | ICD-10-CM | POA: Diagnosis not present

## 2022-05-25 DIAGNOSIS — R69 Illness, unspecified: Secondary | ICD-10-CM | POA: Diagnosis not present

## 2022-05-25 DIAGNOSIS — G894 Chronic pain syndrome: Secondary | ICD-10-CM | POA: Diagnosis not present

## 2022-05-29 ENCOUNTER — Other Ambulatory Visit (HOSPITAL_COMMUNITY): Payer: Self-pay

## 2022-05-29 MED ORDER — HYDROCODONE-ACETAMINOPHEN 10-325 MG PO TABS
1.0000 | ORAL_TABLET | Freq: Three times a day (TID) | ORAL | 0 refills | Status: AC | PRN
  Filled 2022-05-29: qty 90, 30d supply, fill #0

## 2022-05-30 ENCOUNTER — Ambulatory Visit: Payer: Medicare HMO | Admitting: Gastroenterology

## 2022-05-31 ENCOUNTER — Ambulatory Visit: Payer: Medicare HMO | Attending: Cardiology

## 2022-05-31 DIAGNOSIS — R0609 Other forms of dyspnea: Secondary | ICD-10-CM

## 2022-05-31 LAB — ECHOCARDIOGRAM COMPLETE
Area-P 1/2: 5.66 cm2
S' Lateral: 2.8 cm

## 2022-06-06 ENCOUNTER — Telehealth: Payer: Self-pay

## 2022-06-06 NOTE — Telephone Encounter (Signed)
-----   Message from Park Liter, MD sent at 06/06/2022  8:40 AM EST ----- Echocardiogram showed preserved left ventricle ejection fraction, overall looks good

## 2022-06-06 NOTE — Telephone Encounter (Signed)
Patient notified of results through my chart.

## 2022-06-20 ENCOUNTER — Other Ambulatory Visit (HOSPITAL_COMMUNITY): Payer: Self-pay

## 2022-06-20 DIAGNOSIS — M47816 Spondylosis without myelopathy or radiculopathy, lumbar region: Secondary | ICD-10-CM | POA: Diagnosis not present

## 2022-06-20 DIAGNOSIS — R69 Illness, unspecified: Secondary | ICD-10-CM | POA: Diagnosis not present

## 2022-06-20 DIAGNOSIS — G894 Chronic pain syndrome: Secondary | ICD-10-CM | POA: Diagnosis not present

## 2022-06-20 DIAGNOSIS — M47812 Spondylosis without myelopathy or radiculopathy, cervical region: Secondary | ICD-10-CM | POA: Diagnosis not present

## 2022-06-20 MED ORDER — HYDROCODONE-ACETAMINOPHEN 7.5-325 MG PO TABS
1.0000 | ORAL_TABLET | Freq: Four times a day (QID) | ORAL | 0 refills | Status: DC | PRN
  Filled 2022-06-20: qty 85, 22d supply, fill #0

## 2022-06-30 ENCOUNTER — Telehealth (INDEPENDENT_AMBULATORY_CARE_PROVIDER_SITE_OTHER): Payer: Medicare HMO | Admitting: Gastroenterology

## 2022-06-30 DIAGNOSIS — K5909 Other constipation: Secondary | ICD-10-CM | POA: Diagnosis not present

## 2022-06-30 DIAGNOSIS — R1011 Right upper quadrant pain: Secondary | ICD-10-CM | POA: Diagnosis not present

## 2022-06-30 DIAGNOSIS — R1310 Dysphagia, unspecified: Secondary | ICD-10-CM

## 2022-06-30 DIAGNOSIS — K219 Gastro-esophageal reflux disease without esophagitis: Secondary | ICD-10-CM

## 2022-06-30 DIAGNOSIS — K7460 Unspecified cirrhosis of liver: Secondary | ICD-10-CM | POA: Diagnosis not present

## 2022-06-30 DIAGNOSIS — K625 Hemorrhage of anus and rectum: Secondary | ICD-10-CM | POA: Diagnosis not present

## 2022-06-30 DIAGNOSIS — R110 Nausea: Secondary | ICD-10-CM | POA: Diagnosis not present

## 2022-06-30 MED ORDER — FLUTICASONE PROPIONATE 0.05 % EX CREA: TOPICAL_CREAM | Freq: Two times a day (BID) | CUTANEOUS | 2 refills | Status: AC

## 2022-06-30 MED ORDER — PREDNISONE 50 MG PO TABS: ORAL_TABLET | ORAL | 0 refills | Status: DC

## 2022-06-30 MED ORDER — DIPHENHYDRAMINE HCL 25 MG PO TABS: 25.0000 mg | ORAL_TABLET | ORAL | 0 refills | Status: DC

## 2022-06-30 MED ORDER — PROMETHAZINE HCL 25 MG PO TABS: 25.0000 mg | ORAL_TABLET | Freq: Four times a day (QID) | ORAL | 2 refills | Status: DC | PRN

## 2022-06-30 NOTE — Patient Instructions (Addendum)
_______________________________________________________  If you are age 74 or older, your body mass index should be between 23-30. Your There is no height or weight on file to calculate BMI. If this is out of the aforementioned range listed, please consider follow up with your Primary Care Provider.  If you are age 18 or younger, your body mass index should be between 19-25. Your There is no height or weight on file to calculate BMI. If this is out of the aformentioned range listed, please consider follow up with your Primary Care Provider.   ________________________________________________________  The [facility] providers would like to encourage you to use [facility] to communicate with providers for non-urgent requests or questions.  Due to long hold times on the telephone, sending your provider a message by [facility] may be a faster and more efficient way to get a response.  Please allow 48 business hours for a response.  Please remember that this is for non-urgent requests.  _______________________________________________________  Your provider has requested that you go to the basement level for lab work the same day as your Barium Swallow.  Avoid NSIAD's   We have sent the following medications to your pharmacy for you to pick up at your convenience: Fluticasone cream Phenergan  Please purchase the following medications over the counter and take as directed: Benefiber 1 tablespoon daily Doculax 2 tablets a day Miralax 17g daily   We have sent the following medications to your pharmacy for you to pick up at your convenience: Prednisone 50 mg Take one tablet by mouth 13 hours before CT. Take one tablet by mouth 7 hours before CT.  Take one tablet by mouth 1 hour before CT.   Benadryl 25 mg one hour before CT  Please purchase the following medications over the counter and take as directed: Benefiber 1 tablespoon daily in [place] of water Ducolax 2 a day  You have been scheduled for a  Barium Esophogram at [facility] (1st floor of the hospital) on 07-07-2022 at 11am. Please arrive at 1030am for your appointment for registration. Make certain not to have anything to eat or drink 3 hours prior to your test. If you need to reschedule for any reason, please contact radiology at [phone] to do so. __________________________________________________________________ A barium swallow is an examination that concentrates on views of the esophagus. This tends to be a double contrast exam (barium and two liquids which, when combined, create a gas to distend the wall of the oesophagus) or single contrast (non-ionic iodine based). The study is usually tailored to your symptoms so a good history is essential. Attention is paid during the study to the form, structure and configuration of the esophagus, looking for functional disorders (such as aspiration, dysphagia, achalasia, motility and reflux) EXAMINATION You may be asked to change into a gown, depending on the type of swallow being performed. A radiologist and radiographer will perform the procedure. The radiologist will advise you of the type of contrast selected for your procedure and direct you during the exam. You will be asked to stand, sit or lie in several different positions and to hold a small amount of fluid in your mouth before being asked to swallow while the imaging is performed .In some instances you may be asked to swallow barium coated marshmallows to assess the motility of a solid food bolus. The exam can be recorded as a digital or video fluoroscopy procedure. POST PROCEDURE It will take 1-2 days for the barium to pass through your system.  To facilitate this, it is important, unless otherwise directed, to increase your fluids for the next 24-48hrs and to resume your normal diet.  This test typically takes about 30 minutes to  perform. __________________________________________________________________________________  Amy Moon have been scheduled for a CT scan of the abdomen and pelvis at [facility] ([place], [place], [place] [id number]).   You are scheduled on 07-14-2022 at 1130am. You should arrive at 930am for your appointment time for registration. Please follow the written instructions below on the day of your exam:  WARNING: IF YOU ARE ALLERGIC TO IODINE/X-RAY DYE, PLEASE NOTIFY RADIOLOGY IMMEDIATELY AT [phone]! YOU WILL BE GIVEN A 13 HOUR PREMEDICATION PREP.  1) Do not eat or drink anything after 730am (4 hours prior to your test) 2) You will be given 2 bottles of oral contrast to drink on site. The solution may taste better if refrigerated, but do NOT add ice or any other liquid to this solution. Shake well before drinking.    Drink 1 bottle of contrast @ 930am (2 hours prior to your exam)  Drink 1 bottle of contrast @ 1030am (1 hour prior to your exam)  You may take any medications as prescribed with a small amount of water, if necessary. If you take any of the following medications: METFORMIN, GLUCOPHAGE, GLUCOVANCE, AVANDAMET, RIOMET, FORTAMET, [place] MET, JANUMET, GLUMETZA or METAGLIP, you MAY be asked to HOLD this medication 48 hours AFTER the exam.  The purpose of you drinking the oral contrast is to aid in the visualization of your intestinal tract. The contrast solution may cause some diarrhea. Depending on your individual set of symptoms, you may also receive an intravenous injection of x-ray contrast/dye. Plan on being at [facility] for 30 minutes or longer, depending on the type of exam you are having performed.  This test typically takes 30-45 minutes to complete.  If you have any questions regarding your exam or if you need to reschedule, you may call the CT department at [phone] between the hours of 8:00 am and 5:00 pm,  Monday-Friday.  ________________________________________________________________________  Amy Moon bath 2 times a day for 7 days   How to Take a [employer] A sitz bath is a warm water bath that may be used to care for your rectum, genital area, or the area between your rectum and genitals (perineum). In a sitz bath, the water only comes up to your hips and covers your buttocks. A sitz bath may be done in a bathtub or with a portable sitz bath that fits over the toilet. Your health care provider may recommend a sitz bath to help: Relieve pain and discomfort after delivering a baby. Relieve pain and itching from hemorrhoids or anal fissures. Relieve pain after certain surgeries. Relax muscles that are sore or tight. How to take a sitz bath Take 2-4 sitz baths a day, or as many as told by your health care provider. Bathtub sitz bath To take a sitz bath in a bathtub: Partially fill a bathtub with warm water. The water should be deep enough to cover your hips and buttocks when you are sitting in the bathtub. Follow your health care provider's instructions if you are told to put medicine in the water. Sit in the water. Open the bathtub drain a little, and leave it open during your bath. Turn on the warm water again, enough to replace the water that is draining out. Keep the water running throughout your bath. This helps keep the water at  the right level and temperature. Soak in the water for 15-20 minutes, or as long as told by your health care provider. When you are done, be careful when you stand up. You may feel dizzy. After the sitz bath, pat yourself dry. Do not rub your skin to dry it.  Over-the-toilet sitz bath To take a sitz bath with an over-the-toilet basin: Follow the manufacturer's instructions. Fill the basin with warm water. Follow your health care provider's instructions if you were told to put medicine in the water. Sit on the seat. Make sure the water covers your buttocks and  perineum. Soak in the water for 15-20 minutes, or as long as told by your health care provider. After the sitz bath, pat yourself dry. Do not rub your skin to dry it. Clean and dry the basin between uses. Discard the basin if it cracks, or according to the manufacturer's instructions.  Contact a health care provider if: Your pain or itching gets worse. Stop doing sitz baths if your symptoms get worse. You have new symptoms. Stop doing sitz baths until you talk with your health care provider. Summary A sitz bath is a warm water bath in which the water only comes up to your hips and covers your buttocks. Your health care provider may recommend a sitz bath to help relieve pain and discomfort after delivering a baby, relieve pain and itching from hemorrhoids or anal fissures, relieve pain after certain surgeries, or help to relax muscles that are sore or tight. Take 2-4 sitz baths a day, or as many as told by your health care provider. Soak in the water for 15-20 minutes. Stop doing sitz baths if your symptoms get worse. This information is not intended to replace advice given to you by your health care provider. Make sure you discuss any questions you have with your health care provider. Document Revised: 09/13/2021 Document Reviewed: 09/13/2021 Elsevier Patient Education  Kimberly you,  Dr. Jackquline Denmark

## 2022-06-30 NOTE — Progress Notes (Signed)
IMPRESSION and PLAN:    #1. RUQ pain with chronic nausea. Failed zofran. Better with phenergan. US showed gallstone 08/2019, neg HIDA for acute cholecystitis. Neg EGD 08/2021. Now back on narcotics as per pain clinic.  R/o gastroparesis or functional component.  #2. Rectal bleeding likely d/t Hoids. Neg colon Jan 2021 except for 6 mm TA s/p polypectomy.  Mild sig div and int hoids. Rpt routine colon not indicated d/t age.  #3. GERD with dysphagia (resolved after dil 08/2021, now having symptoms lately) d/t distal eso stricture, 2 cm HH. Neg Bx for EoE. H/O food impaction 2003 s/p endoscopic disimpaction. Has assoc oropharyngeal dysphagia d/t ?Parkinson's.  Had PEJ 2012 (removed 2017).   #4. 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 Likely d/t NASH. No ETOH, neg Hep C. No varices on EGD 08/2021 or ascites on [place] 04/2021. CT AP June 2023 neg for any masses. No HE. Nl AFP  #5. Chronic constipation with element of OIC. Failed miralax, colace. Better with 3 dulcolax/day. Neg colon 06/2019. Occ rectal bleeding d/t hoids.  #6. Asymptomatic cholelithiasis. Neg HIDA 09/2019 for acute cholecystitis.     Plan: -Continue Protonix '40mg'$  po BID -Phenergan '25mg'$  po QID prn #45 2RF. Sedation and fall precautions were discussed. -CBC, CMP, AFP, PT INR, CRP, sed rate, lipase, UA -CT Abdo/pelvis with contrast after contrast desensitization -Ba swallow with tab. If abn, rpt EGD with dil -Benefiber 1 TBS p.o. QD with 8 oz of water. -Continue dulcolax 2/day. Add miralax 17g po qd -fluticasone cream 0.05% generic 30g 1 bid PR x 10 days, 2 refills -Sitz baths BID x 7 days. -Avoid NSAIDs. -Minimize pain medications. -Call in 2 weeks. If still with rectal bleeding, ref to Sx for EUA/hoidectomy -PCP- Appt with Dr Marco Collie for recurrent fevers since 2022. ?etiology and to get established -FU in 3 months. If still with  problems, further WU by means of GES off narcotics.    Prednisone protocol for IV contrast desensitization: Give prednisone 50 mg p.o. 13, 7 and 1 hour before the procedure and Benadryl 25 mg p.o. 1 hour before the procedure.  HPI:    Chief Complaint:   Amy Moon is a 72 y.o. female  With CAD on Plavix, HTN, H/O cellulitis along L knee replacement scar (treated with doxycycline), insomnia, unexplained fever (being considered for ID ref), multiple other medical problems as listed below who does not have a PCP.  Video visit today in this complex patient.  She was originally scheduled to come in person but could not make it to the clinic.  With continued RUQ pain, associated nausea worst after eating.  No vomiting.  Zofran did not help.  Better with Phenergan 25 mg p.o. daily to twice daily as needed.  Lately started having solid food dysphagia with foods like meats getting hung up in the lower chest.  Not as bad as before dilation.  Continues to have heartburn despite Protonix 40 twice daily.  Has chronic constipation-worse lately, has been taking Percocet (7.5/325) 1-2/day under guidance of pain management for osteoarthritis.  Dulcolax 2/day does help.  Continues to have abdominal bloating and intermittent rectal bleeding attributed to hemorrhoids.  Continues to have fluctuating fever since 2022 requiring Tylenol as needed.  No chills.  No definite pattern.  Unfortunately, she does not have PCP anymore and wanted [place] to refer her to someone in [place].  No jaundice  dark urine or pale stools.  No abdominal distention.  Denies having any history of confusion or memory lapses.  No easy bruisability. No ETOH.  Nl CBC (except plt 149), CMP (alb 4.3), PT INR, AFP Nov 2022  No weight loss.  In fact she has gained weight as below Wt Readings from Last 3 Encounters:  05/15/22 154 lb (69.9 kg)  04/13/22 151 lb (68.5 kg)  04/06/22 147 lb (66.7 kg)     Past GI Eval:  EGD 09/14/2021 -  Benign-appearing esophageal stenosis. Dilated to 52Fr [facility] - Small hiatal hernia. - A single gastric polyp. - No esophageal varices.  EGD: 07/16/2019 distal esophageal stricture s/p dilatation, grade A esophagitis, small hiatal hernia. Neg SB Bx for celiac, neg gastric Bx for HP.  Colonoscopy Jan 2021:  6 mm colonic polyp s/p polypectomy, mild sig div. Otherwise Nl to TI.  Bx- tubular adenoma.  Rpt not indicated d/t age.  CT AP 11/2021 without contrast 1. No acute findings identified within the abdomen. 2. Morphologic features of the liver compatible with cirrhosis. No focal liver abnormalities identified. 3. Stigmata of portal venous hypertension including esophageal varices and recanalization of the umbilical vein. 4. Gallstones. 5. Increase caliber of the common bile duct measures up to 8 mm. Previously the CBD measured the same. No definite signs of choledocholithiasis. 6. Aortic Atherosclerosis (ICD10-I70.0).  CT AP with contrast 12/25/2019 1. No acute findings within the abdomen or pelvis. 2. Hepatic cirrhosis and findings of portal venous hypertension. No evidence of hepatic neoplasm.  No ascites.  Mild splenomegaly. 3. Cholelithiasis. No radiographic evidence of cholecystitis.  [place] 04/2021 1. Cholelithiasis with no findings of acute cholecystitis. 2. Hepatic steatosis. Please note limited evaluation for focal hepatic masses in a patient with hepatic steatosis due to decreased penetration of the acoustic ultrasound waves. 3. Nonvisualized mid aorta.   [place] 08/2019 1. There is a 1.4 cm stone in the gallbladder. A positive Murphy's sign is reported. However, there is no wall thickening or pericholecystic fluid. Recommend clinical correlation. If there is concern for cholecystitis, recommend HIDA scan.  HIDA: neg 2. Probable hepatic steatosis. 3. No other abnormalities.   Past Medical History:  Diagnosis Date   Angina pectoris (HCC)    Chronic kidney disease     Drug-induced low platelet count    Duodenal ulcer    Granulomatous disease ([place])    Hepatic disease    Hypertension    Low vitamin D level    Neuropathy    Osteopenia    Parkinson's disease (HCC)        Splenomegaly a    Current Outpatient Medications  Medication Sig Dispense Refill   acetaminophen (TYLENOL) 325 MG tablet Take 650 mg by mouth every 6 (six) hours as needed for mild pain or moderate pain.     atorvastatin (LIPITOR) 10 MG tablet Take 1 tablet (10 mg total) by mouth daily. 90 tablet 2   bisacodyl (DULCOLAX) 5 MG EC tablet Take 15 mg by mouth at bedtime as needed for moderate constipation.     clopidogrel (PLAVIX) 75 MG tablet Take 1 tablet (75 mg total) by mouth daily. 90 tablet 2   diphenhydrAMINE (BENADRYL) 25 MG tablet Take 25 mg by mouth every 6 (six) hours as needed for allergies.     furosemide (LASIX) 40 MG tablet Take 60 mg once daily, 1 and 1/2 tablets daily (Patient taking differently: Take 60 mg by mouth daily. Take 60 mg once daily, 1 and 1/2 tablets daily)  135 tablet 1   HYDROcodone-acetaminophen (NORCO) 10-325 MG tablet Take 1 tablet by mouth 3 (three) times daily as needed for pain 90 tablet 0   HYDROcodone-acetaminophen (NORCO) 7.5-325 MG tablet Take 1 tablet by mouth every 6 (six) hours as needed. 85 tablet 0   hydrocortisone (ANUSOL-HC) 25 MG suppository Place 1 suppository (25 mg total) rectally 2 (two) times daily. After bowel movement and at bedtime (Patient not taking: Reported on 06/30/2022) 20 suppository 1   mirtazapine (REMERON) 7.5 MG tablet Take 7.5 mg by mouth daily.     nitroGLYCERIN (NITROSTAT) 0.4 MG SL tablet Place 0.4 mg under the tongue every 5 (five) minutes as needed for chest pain.  (Patient not taking: Reported on 06/30/2022)     pantoprazole (PROTONIX) 40 MG tablet TAKE 1 TABLET BY MOUTH 2 TIMES DAILY. (Patient taking differently: Take 40 mg by mouth 2 (two) times daily.) 60 tablet 5   potassium chloride (KLOR-CON) 10 MEQ tablet TAKE 1  TABLET (10 MEQ TOTAL) BY MOUTH DAILY. 90 tablet 1   promethazine (PHENERGAN) 25 MG tablet Take 1 tablet (25 mg total) by mouth every 6 (six) hours as needed for nausea or vomiting. 45 tablet 2   No current facility-administered medications for this visit.    Past Surgical History:  Procedure Laterality Date   ABDOMINAL HYSTERECTOMY     APPENDECTOMY     BACK SURGERY     thoracic-fractured    BACK SURGERY     lumbar - fractured   BREAST LUMPECTOMY WITH RADIOACTIVE SEED LOCALIZATION Right 08/01/2018   Procedure: RADIOACTIVE SEED GUIDED RIGHT BREAST LUMPECTOMY;  Surgeon: Coralie Keens, MD;  Location: [facility];  Service: General;  Laterality: Right;   COLONOSCOPY     ESOPHAGOGASTRODUODENOSCOPY  05/20/2008   Mild to moderate esophagitis. Otherwise normal EGD.    EYE SURGERY Left    cataract surgery with lens implant   GASTROSTOMY     peptic ulcer - peg tube placement   TOTAL KNEE ARTHROPLASTY Left 01/21/2021   Procedure: LEFT TOTAL KNEE ARTHROPLASTY;  Surgeon: Mcarthur Rossetti, MD;  Location: [facility];  Service: Orthopedics;  Laterality: Left;  Need RNFA   UPPER GASTROINTESTINAL ENDOSCOPY      Family History  Problem Relation Age of Onset   Cancer Mother        ovarian   Hyperlipidemia Mother    Alzheimer's disease Mother    Arthritis Mother        rheumatoid   Cancer Father        lung   Parkinson's disease Father    Alzheimer's disease Father    ALS Sister    Cancer Maternal Aunt        pancreatic, lung, liver,breast   Breast cancer Maternal Aunt    Cancer Maternal Uncle        pancreatic, liver, brain   Colon cancer Maternal Uncle    Heart attack Paternal Aunt    Stroke Paternal Aunt    Stomach cancer Paternal Aunt    Heart attack Paternal Uncle    Stroke Paternal Grandmother    Heart attack Paternal Grandfather    Arthritis Sister        rheumatoid   Huntington's disease Daughter        presumed inherited from father per patient   Colon cancer Maternal  Uncle    Pancreatic cancer Maternal Uncle    Liver disease Maternal Uncle    Breast cancer Maternal Aunt    Breast cancer Maternal  Aunt    Esophageal cancer Neg Hx    Rectal cancer Neg Hx     Social History   Tobacco Use   Smoking status: Former    Packs/day: 1.00    Years: 22.00    Total pack years: 22.00    Types: Cigarettes    Quit date: 06/26/1986    Years since quitting: 36.0   Smokeless tobacco: Never  Vaping Use   Vaping Use: Never used  Substance Use Topics   Alcohol use: No   Drug use: No    Allergies  Allergen Reactions   Iodinated Contrast Media Hives   Nsaids Other (See Comments)    History of bleeding ulcers   Latex Hives and Rash   Sinemet [Carbidopa W-Levodopa] Nausea And Vomiting   Inderal [Propranolol] Other (See Comments)    Unknown   Indomethacin Other (See Comments)    Unknown   Penicillin G Other (See Comments)    Unknown   Pneumococcal Vaccines Other (See Comments)    Caused "pneumonia"   Scopolamine Other (See Comments)    The patch, caused her to pass out / change in mental status   Tape Other (See Comments)    Unknown     Review of Systems: All systems reviewed and negative except where noted in HPI.    Physical Exam:    Not examined since this was a video visit  I connected with  [name] on 06/30/22 by a video enabled telemedicine application and verified that I am speaking with the correct person using two identifiers.   I discussed the limitations of evaluation and management by telemedicine. The patient expressed understanding and agreed to proceed.  Patient at home Physician at physicians office.     Zuri Bradway,MD 06/30/2022, 3:02 PM   CC Lorrene Reid, PA-C

## 2022-07-05 ENCOUNTER — Other Ambulatory Visit (HOSPITAL_COMMUNITY): Payer: Self-pay

## 2022-07-05 MED ORDER — HYDROCODONE-ACETAMINOPHEN 7.5-325 MG PO TABS
1.0000 | ORAL_TABLET | Freq: Four times a day (QID) | ORAL | 0 refills | Status: DC | PRN
  Filled 2022-07-06 – 2022-07-10 (×3): qty 35, 9d supply, fill #0

## 2022-07-06 ENCOUNTER — Other Ambulatory Visit (HOSPITAL_COMMUNITY): Payer: Self-pay

## 2022-07-07 ENCOUNTER — Other Ambulatory Visit (HOSPITAL_COMMUNITY): Payer: Medicare HMO

## 2022-07-10 ENCOUNTER — Telehealth: Payer: Self-pay

## 2022-07-10 ENCOUNTER — Other Ambulatory Visit (HOSPITAL_COMMUNITY): Payer: Self-pay

## 2022-07-10 NOTE — Telephone Encounter (Addendum)
Fax went through successfully.

## 2022-07-10 NOTE — Telephone Encounter (Signed)
Spoke with Caryl Pina at Dr [facility] Cap[facility]. They accept [facility] and they will call the patient to  schedule. Referral (last ov sent only patient hasn't done labs or imaging yet) sent for patient to establish care and recurrent fevers. Fax number [phone]. Phone umber [phone]

## 2022-07-11 ENCOUNTER — Telehealth: Payer: Self-pay | Admitting: Cardiology

## 2022-07-11 MED ORDER — FUROSEMIDE 40 MG PO TABS: 60.0000 mg | ORAL_TABLET | Freq: Every day | ORAL | 2 refills | Status: DC

## 2022-07-11 NOTE — Telephone Encounter (Signed)
*  STAT* If patient is at the pharmacy, call can be transferred to refill team.   1. Which medications need to be refilled? (please list name of each medication and dose if known) furosemide (LASIX) 40 MG tablet   2. Which pharmacy/location (including street and city if local pharmacy) is medication to be sent to?   [facility]  [address], [place] [address]  [phone]    3. Do they need a 30 day or 90 day supply?  [place]

## 2022-07-11 NOTE — Telephone Encounter (Signed)
[facility] [place].

## 2022-07-12 ENCOUNTER — Inpatient Hospital Stay (HOSPITAL_COMMUNITY): Admission: RE | Admit: 2022-07-12 | Payer: Medicare HMO | Source: Ambulatory Visit

## 2022-07-12 NOTE — Telephone Encounter (Signed)
Incoming call from patient states she is giving Brooke a call back.

## 2022-07-12 NOTE — Telephone Encounter (Signed)
Patient needs her CT and barium swallow rescheduled. I told her that I will do that tomorrow since they are closed. Will reschedule for February for patient and call her back and let her know

## 2022-07-13 NOTE — Telephone Encounter (Signed)
You have been scheduled for a Barium Esophogram at [facility] Radiology (1st floor of the hospital) on 08-02-2022 at 11am. Please arrive 1030am for your appointment for registration. Make certain not to have anything to eat or drink 3 hours prior to your test. If you need to reschedule for any reason, please contact radiology at [phone] to do so. __________________________________________________________________ A barium swallow is an examination that concentrates on views of the esophagus. This tends to be a double contrast exam (barium and two liquids which, when combined, create a gas to distend the wall of the oesophagus) or single contrast (non-ionic iodine based). The study is usually tailored to your symptoms so a good history is essential. Attention is paid during the study to the form, structure and configuration of the esophagus, looking for functional disorders (such as aspiration, dysphagia, achalasia, motility and reflux) EXAMINATION You may be asked to change into a gown, depending on the type of swallow being performed. A radiologist and radiographer will perform the procedure. The radiologist will advise you of the type of contrast selected for your procedure and direct you during the exam. You will be asked to stand, sit or lie in several different positions and to hold a small amount of fluid in your mouth before being asked to swallow while the imaging is performed .In some instances you may be asked to swallow barium coated marshmallows to assess the motility of a solid food bolus. The exam can be recorded as a digital or video fluoroscopy procedure. POST PROCEDURE It will take 1-2 days for the barium to pass through your system. To facilitate this, it is important, unless otherwise directed, to increase your fluids for the next 24-48hrs and to resume your normal diet.  This test typically takes about 30 minutes to  perform. __________________________________________________________________________________   Amy Moon have been scheduled for a CT scan of the abdomen and pelvis at [facility] ([address], [place], [place] [address]).   You are scheduled on 08-09-2022 at 1130am. You should arrive at 915am for your appointment time for registration. Please follow the written instructions below on the day of your exam:  WARNING: IF YOU ARE ALLERGIC TO IODINE/X-RAY DYE, PLEASE NOTIFY RADIOLOGY IMMEDIATELY AT [phone]! YOU WILL BE GIVEN A 13 HOUR PREMEDICATION PREP.  1) Do not eat or drink anything after 730am (4 hours prior to your test) 2) You will be given 2 bottles of oral contrast to drink on site. The solution may taste better if refrigerated, but do NOT add ice or any other liquid to this solution. Shake well before drinking.    Drink 1 bottle of contrast @ 930am (2 hours prior to your exam)  Drink 1 bottle of contrast @ 1030am (1 hour prior to your exam)  You may take any medications as prescribed with a small amount of water, if necessary. If you take any of the following medications: METFORMIN, GLUCOPHAGE, GLUCOVANCE, AVANDAMET, RIOMET, FORTAMET, [place] MET, JANUMET, GLUMETZA or METAGLIP, you MAY be asked to HOLD this medication 48 hours AFTER the exam.  The purpose of you drinking the oral contrast is to aid in the visualization of your intestinal tract. The contrast solution may cause some diarrhea. Depending on your individual set of symptoms, you may also receive an intravenous injection of x-ray contrast/dye. Plan on being at [facility] for 30 minutes or longer, depending on the type of exam you are having performed.  This test typically takes 30-45 minutes to complete.  If  you have any questions regarding your exam or if you need to reschedule, you may call the CT department at [phone] between the hours of 8:00 am and 5:00 pm,  Monday-Friday.  ________________________________________________________________________

## 2022-07-13 NOTE — Telephone Encounter (Signed)
Spoke to patient and she aware of the rescheduling. Said she will check MyChart and call with any questions regarding her instructions for the procedures. Told her I sent her a mychart message for this. She has heard from Dr Nyra Capes office as well about an appointment too

## 2022-07-14 ENCOUNTER — Ambulatory Visit (HOSPITAL_COMMUNITY): Payer: Medicare HMO

## 2022-07-18 ENCOUNTER — Other Ambulatory Visit (INDEPENDENT_AMBULATORY_CARE_PROVIDER_SITE_OTHER): Payer: Medicare HMO

## 2022-07-18 DIAGNOSIS — K5909 Other constipation: Secondary | ICD-10-CM | POA: Diagnosis not present

## 2022-07-18 DIAGNOSIS — K219 Gastro-esophageal reflux disease without esophagitis: Secondary | ICD-10-CM | POA: Diagnosis not present

## 2022-07-18 DIAGNOSIS — K625 Hemorrhage of anus and rectum: Secondary | ICD-10-CM

## 2022-07-18 DIAGNOSIS — R1011 Right upper quadrant pain: Secondary | ICD-10-CM | POA: Diagnosis not present

## 2022-07-18 DIAGNOSIS — G894 Chronic pain syndrome: Secondary | ICD-10-CM | POA: Diagnosis not present

## 2022-07-18 DIAGNOSIS — R1310 Dysphagia, unspecified: Secondary | ICD-10-CM | POA: Diagnosis not present

## 2022-07-18 DIAGNOSIS — R110 Nausea: Secondary | ICD-10-CM | POA: Diagnosis not present

## 2022-07-18 DIAGNOSIS — M47812 Spondylosis without myelopathy or radiculopathy, cervical region: Secondary | ICD-10-CM | POA: Diagnosis not present

## 2022-07-18 DIAGNOSIS — K7460 Unspecified cirrhosis of liver: Secondary | ICD-10-CM

## 2022-07-18 DIAGNOSIS — R69 Illness, unspecified: Secondary | ICD-10-CM | POA: Diagnosis not present

## 2022-07-18 DIAGNOSIS — M47816 Spondylosis without myelopathy or radiculopathy, lumbar region: Secondary | ICD-10-CM | POA: Diagnosis not present

## 2022-07-18 LAB — CBC WITH DIFFERENTIAL/PLATELET
Basophils Absolute: 0.1 10*3/uL (ref 0.0–0.1)
Basophils Relative: 1.3 % (ref 0.0–3.0)
Eosinophils Absolute: 0.1 10*3/uL (ref 0.0–0.7)
Eosinophils Relative: 1.7 % (ref 0.0–5.0)
HCT: 34.8 % — ABNORMAL LOW (ref 36.0–46.0)
Hemoglobin: 11.8 g/dL — ABNORMAL LOW (ref 12.0–15.0)
Lymphocytes Relative: 23.8 % (ref 12.0–46.0)
Lymphs Abs: 1.1 10*3/uL (ref 0.7–4.0)
MCHC: 33.8 g/dL (ref 30.0–36.0)
MCV: 90.9 fl (ref 78.0–100.0)
Monocytes Absolute: 0.3 10*3/uL (ref 0.1–1.0)
Monocytes Relative: 7 % (ref 3.0–12.0)
Neutro Abs: 3 10*3/uL (ref 1.4–7.7)
Neutrophils Relative %: 66.2 % (ref 43.0–77.0)
Platelets: 184 10*3/uL (ref 150.0–400.0)
RBC: 3.83 Mil/uL — ABNORMAL LOW (ref 3.87–5.11)
RDW: 13.7 % (ref 11.5–15.5)
WBC: 4.5 10*3/uL (ref 4.0–10.5)

## 2022-07-18 LAB — SEDIMENTATION RATE: Sed Rate: 20 mm/hr (ref 0–30)

## 2022-07-18 LAB — COMPREHENSIVE METABOLIC PANEL
ALT: 11 U/L (ref 0–35)
AST: 16 U/L (ref 0–37)
Albumin: 4.4 g/dL (ref 3.5–5.2)
Alkaline Phosphatase: 92 U/L (ref 39–117)
BUN: 10 mg/dL (ref 6–23)
CO2: 28 mEq/L (ref 19–32)
Calcium: 9.5 mg/dL (ref 8.4–10.5)
Chloride: 101 mEq/L (ref 96–112)
Creatinine, Ser: 0.61 mg/dL (ref 0.40–1.20)
GFR: 88.31 mL/min (ref >60.00)
Glucose, Bld: 102 mg/dL — ABNORMAL HIGH (ref 70–99)
Potassium: 3.4 mEq/L — ABNORMAL LOW (ref 3.5–5.1)
Sodium: 140 mEq/L (ref 135–145)
Total Bilirubin: 0.8 mg/dL (ref 0.2–1.2)
Total Protein: 7.4 g/dL (ref 6.0–8.3)

## 2022-07-18 LAB — URINALYSIS WITH CULTURE, IF INDICATED
Bilirubin Urine: NEGATIVE
Ketones, ur: NEGATIVE
Nitrite: NEGATIVE
Specific Gravity, Urine: 1.02 (ref 1.000–1.030)
Total Protein, Urine: NEGATIVE
Urine Glucose: NEGATIVE
Urobilinogen, UA: 1 (ref 0.0–1.0)
pH: 6 (ref 5.0–8.0)

## 2022-07-18 LAB — PROTIME-INR
INR: 1.1 ratio — ABNORMAL HIGH (ref 0.8–1.0)
Prothrombin Time: 12.2 s (ref 9.6–13.1)

## 2022-07-18 LAB — C-REACTIVE PROTEIN: CRP: <1 mg/dL (ref 0.5–20.0)

## 2022-07-18 LAB — LIPASE: Lipase: 4 U/L — ABNORMAL LOW (ref 11.0–59.0)

## 2022-07-19 LAB — URINE CULTURE
MICRO NUMBER:: 14460988
SPECIMEN QUALITY:: ADEQUATE

## 2022-07-20 LAB — AFP TUMOR MARKER: AFP-Tumor Marker: 4.5 ng/mL

## 2022-07-28 ENCOUNTER — Other Ambulatory Visit: Payer: Self-pay | Admitting: Gastroenterology

## 2022-08-02 ENCOUNTER — Other Ambulatory Visit (HOSPITAL_COMMUNITY): Payer: Medicare HMO

## 2022-08-02 ENCOUNTER — Encounter (HOSPITAL_COMMUNITY): Payer: Self-pay

## 2022-08-03 ENCOUNTER — Telehealth: Payer: Self-pay

## 2022-08-03 DIAGNOSIS — K5909 Other constipation: Secondary | ICD-10-CM

## 2022-08-03 DIAGNOSIS — R110 Nausea: Secondary | ICD-10-CM

## 2022-08-03 DIAGNOSIS — K7460 Unspecified cirrhosis of liver: Secondary | ICD-10-CM

## 2022-08-03 DIAGNOSIS — K219 Gastro-esophageal reflux disease without esophagitis: Secondary | ICD-10-CM

## 2022-08-03 DIAGNOSIS — R1310 Dysphagia, unspecified: Secondary | ICD-10-CM

## 2022-08-03 DIAGNOSIS — R1011 Right upper quadrant pain: Secondary | ICD-10-CM

## 2022-08-03 NOTE — Telephone Encounter (Signed)
Patient made aware and will need to work on barium swallow. Insurance wouldn't cover the barium swallow at [employer]

## 2022-08-07 NOTE — Telephone Encounter (Signed)
Spoke to [place] at the [phone] with [place].  Said that we had to submit the appeal with the last clinical notes and imaging studies and lab work to [phone] [employer] and grievance. Also imaging places in [place] including [place] [facility] is out of network for her. But we checked with [place] and the imaging place on [address] is in network for her and in [place] Friday to [place] and she said that [employer] doesn't do peer to peer for this patient's insurance. And directed me to patient's insurance. Said the claim for CT was denied on 07-18-2022. Spoke to [place] and [place] and they said they didn't see a claim on Friday form [employer]

## 2022-08-07 NOTE — Telephone Encounter (Signed)
barium swallow is [address].  Patient was made aware and was told that she would call the insurance company and she will see what is best and was given the CPT code .

## 2022-08-08 NOTE — Telephone Encounter (Signed)
PT called to get update on appeal for CT. Advised we would wait on decision from insurance company before deciding what is best.

## 2022-08-09 ENCOUNTER — Ambulatory Visit (HOSPITAL_COMMUNITY): Payer: Medicare HMO

## 2022-08-09 NOTE — Telephone Encounter (Signed)
Patient said that she hasn't called insurance regarding her Barium swallow although I have given her. I told her that they don't have denial or any notes pertaining to her barium swallow that they told her the cost is a lot.  But she told me she can't do the 20th and 22th.  I told her that we are waiting on the insurance. I did an appeal and it can take up to 30 days before we hear something and he voiced understanding

## 2022-08-15 DIAGNOSIS — G894 Chronic pain syndrome: Secondary | ICD-10-CM | POA: Diagnosis not present

## 2022-08-15 DIAGNOSIS — M47812 Spondylosis without myelopathy or radiculopathy, cervical region: Secondary | ICD-10-CM | POA: Diagnosis not present

## 2022-08-15 DIAGNOSIS — R69 Illness, unspecified: Secondary | ICD-10-CM | POA: Diagnosis not present

## 2022-08-15 DIAGNOSIS — M47816 Spondylosis without myelopathy or radiculopathy, lumbar region: Secondary | ICD-10-CM | POA: Diagnosis not present

## 2022-08-19 ENCOUNTER — Other Ambulatory Visit: Payer: Self-pay | Admitting: Nurse Practitioner

## 2022-08-19 DIAGNOSIS — E876 Hypokalemia: Secondary | ICD-10-CM

## 2022-09-01 ENCOUNTER — Other Ambulatory Visit: Payer: Self-pay | Admitting: Gastroenterology

## 2022-09-08 MED ORDER — PREDNISONE 50 MG PO TABS: ORAL_TABLET | ORAL | 0 refills | Status: DC

## 2022-09-08 MED ORDER — DIPHENHYDRAMINE HCL 25 MG PO TABS: 25.0000 mg | ORAL_TABLET | ORAL | 0 refills | Status: DC

## 2022-09-08 NOTE — Telephone Encounter (Signed)
Appeal number [id number] for CT abd/pelv. 08-22-2022 Authorized. Auth Number [id number] for cpt [phone] from 07-07-2022 to 02-08-2023. Spoke with Renelda Loma who gave me this information at [phone]. Spoke with Tempie Donning at [phone] to start  Spoke with patient and we will scheudle CT for mid April due to husband having surgery next week and she will come 1 week prior to CT to get lab work done. Mychart message sent and he was made aware and will call as it gets closer if she has any problems

## 2022-09-12 NOTE — Addendum Note (Signed)
Addended by: Curlene Labrum E on: 09/12/2022 09:49 AM   Modules accepted: Orders

## 2022-09-16 ENCOUNTER — Other Ambulatory Visit: Payer: Self-pay | Admitting: Cardiology

## 2022-09-18 DIAGNOSIS — R69 Illness, unspecified: Secondary | ICD-10-CM | POA: Diagnosis not present

## 2022-09-18 DIAGNOSIS — F32A Depression, unspecified: Secondary | ICD-10-CM | POA: Diagnosis not present

## 2022-09-18 DIAGNOSIS — M47812 Spondylosis without myelopathy or radiculopathy, cervical region: Secondary | ICD-10-CM | POA: Diagnosis not present

## 2022-09-18 DIAGNOSIS — M47816 Spondylosis without myelopathy or radiculopathy, lumbar region: Secondary | ICD-10-CM | POA: Diagnosis not present

## 2022-09-18 DIAGNOSIS — G894 Chronic pain syndrome: Secondary | ICD-10-CM | POA: Diagnosis not present

## 2022-09-24 ENCOUNTER — Other Ambulatory Visit: Payer: Self-pay | Admitting: Gastroenterology

## 2022-09-25 ENCOUNTER — Telehealth: Payer: Self-pay | Admitting: Gastroenterology

## 2022-09-25 MED ORDER — PROMETHAZINE HCL 25 MG PO TABS: ORAL_TABLET | ORAL | 0 refills | Status: DC

## 2022-09-25 NOTE — Telephone Encounter (Signed)
Done

## 2022-09-25 NOTE — Telephone Encounter (Signed)
PT is calling to get a refill for promethazine. [facility] [place]

## 2022-09-28 ENCOUNTER — Other Ambulatory Visit: Payer: Self-pay

## 2022-09-28 DIAGNOSIS — E876 Hypokalemia: Secondary | ICD-10-CM

## 2022-09-28 MED ORDER — POTASSIUM CHLORIDE ER 10 MEQ PO TBCR: 10.0000 meq | EXTENDED_RELEASE_TABLET | Freq: Every day | ORAL | 2 refills | Status: DC

## 2022-10-09 ENCOUNTER — Other Ambulatory Visit: Payer: Self-pay | Admitting: Cardiology

## 2022-10-09 DIAGNOSIS — R072 Precordial pain: Secondary | ICD-10-CM

## 2022-10-10 ENCOUNTER — Ambulatory Visit (HOSPITAL_COMMUNITY): Payer: Medicare HMO

## 2022-10-18 ENCOUNTER — Telehealth: Payer: Self-pay | Admitting: Gastroenterology

## 2022-10-18 ENCOUNTER — Other Ambulatory Visit: Payer: Self-pay | Admitting: Gastroenterology

## 2022-10-18 ENCOUNTER — Other Ambulatory Visit (INDEPENDENT_AMBULATORY_CARE_PROVIDER_SITE_OTHER): Payer: Medicare Other

## 2022-10-18 DIAGNOSIS — R1310 Dysphagia, unspecified: Secondary | ICD-10-CM

## 2022-10-18 DIAGNOSIS — K5909 Other constipation: Secondary | ICD-10-CM

## 2022-10-18 DIAGNOSIS — K7460 Unspecified cirrhosis of liver: Secondary | ICD-10-CM | POA: Diagnosis not present

## 2022-10-18 DIAGNOSIS — K219 Gastro-esophageal reflux disease without esophagitis: Secondary | ICD-10-CM

## 2022-10-18 DIAGNOSIS — R110 Nausea: Secondary | ICD-10-CM

## 2022-10-18 DIAGNOSIS — R1011 Right upper quadrant pain: Secondary | ICD-10-CM

## 2022-10-18 LAB — CBC WITH DIFFERENTIAL/PLATELET
Basophils Absolute: 0.1 10*3/uL (ref 0.0–0.1)
Basophils Relative: 1.4 % (ref 0.0–3.0)
Eosinophils Absolute: 0.2 10*3/uL (ref 0.0–0.7)
Eosinophils Relative: 3.3 % (ref 0.0–5.0)
HCT: 33.8 % — ABNORMAL LOW (ref 36.0–46.0)
Hemoglobin: 11.1 g/dL — ABNORMAL LOW (ref 12.0–15.0)
Lymphocytes Relative: 30.5 % (ref 12.0–46.0)
Lymphs Abs: 1.6 10*3/uL (ref 0.7–4.0)
MCHC: 32.7 g/dL (ref 30.0–36.0)
MCV: 87.2 fl (ref 78.0–100.0)
Monocytes Absolute: 0.4 10*3/uL (ref 0.1–1.0)
Monocytes Relative: 6.7 % (ref 3.0–12.0)
Neutro Abs: 3.1 10*3/uL (ref 1.4–7.7)
Neutrophils Relative %: 58.1 % (ref 43.0–77.0)
Platelets: 190 10*3/uL (ref 150.0–400.0)
RBC: 3.88 Mil/uL (ref 3.87–5.11)
RDW: 14.1 % (ref 11.5–15.5)
WBC: 5.3 10*3/uL (ref 4.0–10.5)

## 2022-10-18 LAB — COMPREHENSIVE METABOLIC PANEL
ALT: 10 U/L (ref 0–35)
AST: 16 U/L (ref 0–37)
Albumin: 4.1 g/dL (ref 3.5–5.2)
Alkaline Phosphatase: 93 U/L (ref 39–117)
BUN: 6 mg/dL (ref 6–23)
CO2: 31 mEq/L (ref 19–32)
Calcium: 9.3 mg/dL (ref 8.4–10.5)
Chloride: 98 mEq/L (ref 96–112)
Creatinine, Ser: 0.78 mg/dL (ref 0.40–1.20)
GFR: 74.89 mL/min (ref >60.00)
Glucose, Bld: 87 mg/dL (ref 70–99)
Potassium: 3.3 mEq/L — ABNORMAL LOW (ref 3.5–5.1)
Sodium: 140 mEq/L (ref 135–145)
Total Bilirubin: 0.6 mg/dL (ref 0.2–1.2)
Total Protein: 7 g/dL (ref 6.0–8.3)

## 2022-10-18 NOTE — Telephone Encounter (Signed)
Patient is requesting a call back regarding lab work that is suppose to be done, she is wondering if she still needed to have it done. Please advise. Thank you

## 2022-10-18 NOTE — Telephone Encounter (Signed)
Advised that patient needs to get blood work done by Friday

## 2022-10-24 ENCOUNTER — Ambulatory Visit (HOSPITAL_COMMUNITY)
Admission: RE | Admit: 2022-10-24 | Discharge: 2022-10-24 | Disposition: A | Discharge status: Home / Self Care | Payer: Medicare Other | Source: Ambulatory Visit | Attending: Gastroenterology | Admitting: Gastroenterology

## 2022-10-24 DIAGNOSIS — K7460 Unspecified cirrhosis of liver: Secondary | ICD-10-CM

## 2022-10-24 DIAGNOSIS — R1310 Dysphagia, unspecified: Secondary | ICD-10-CM | POA: Diagnosis not present

## 2022-10-24 DIAGNOSIS — K219 Gastro-esophageal reflux disease without esophagitis: Secondary | ICD-10-CM

## 2022-10-24 DIAGNOSIS — R1011 Right upper quadrant pain: Secondary | ICD-10-CM | POA: Insufficient documentation

## 2022-10-24 DIAGNOSIS — R109 Unspecified abdominal pain: Secondary | ICD-10-CM | POA: Diagnosis not present

## 2022-10-24 DIAGNOSIS — K5909 Other constipation: Secondary | ICD-10-CM

## 2022-10-24 DIAGNOSIS — I700 Atherosclerosis of aorta: Secondary | ICD-10-CM | POA: Diagnosis not present

## 2022-10-24 DIAGNOSIS — R110 Nausea: Secondary | ICD-10-CM | POA: Diagnosis not present

## 2022-10-24 MED ORDER — IOHEXOL 300 MG/ML  SOLN
100.0000 mL | Freq: Once | INTRAMUSCULAR | Status: AC | PRN
  Administered 2022-10-24: 100 mL via INTRAVENOUS

## 2022-10-24 MED ORDER — SODIUM CHLORIDE (PF) 0.9 % IJ SOLN
INTRAMUSCULAR | Status: AC
  Filled 2022-10-24: qty 50

## 2022-11-02 ENCOUNTER — Other Ambulatory Visit: Payer: Self-pay | Admitting: Gastroenterology

## 2022-11-03 ENCOUNTER — Other Ambulatory Visit: Payer: Self-pay | Admitting: Gastroenterology

## 2022-11-06 NOTE — Telephone Encounter (Signed)
PA previously denied, denial letter has been attached in Media tab

## 2022-11-13 DIAGNOSIS — F32A Depression, unspecified: Secondary | ICD-10-CM | POA: Diagnosis not present

## 2022-11-13 DIAGNOSIS — G894 Chronic pain syndrome: Secondary | ICD-10-CM | POA: Diagnosis not present

## 2022-11-13 DIAGNOSIS — M47816 Spondylosis without myelopathy or radiculopathy, lumbar region: Secondary | ICD-10-CM | POA: Diagnosis not present

## 2022-11-13 DIAGNOSIS — Z79891 Long term (current) use of opiate analgesic: Secondary | ICD-10-CM | POA: Diagnosis not present

## 2022-11-13 DIAGNOSIS — M47812 Spondylosis without myelopathy or radiculopathy, cervical region: Secondary | ICD-10-CM | POA: Diagnosis not present

## 2022-11-22 ENCOUNTER — Encounter: Payer: Self-pay | Admitting: Gastroenterology

## 2022-11-23 ENCOUNTER — Other Ambulatory Visit: Payer: Self-pay | Admitting: Gastroenterology

## 2022-12-06 ENCOUNTER — Other Ambulatory Visit: Payer: Self-pay

## 2022-12-06 MED ORDER — PROMETHAZINE HCL 25 MG PO TABS: 25.0000 mg | ORAL_TABLET | Freq: Four times a day (QID) | ORAL | 0 refills | Status: AC | PRN

## 2022-12-06 NOTE — Progress Notes (Signed)
Refill sent and patient needs to get nausea medication from her PCP from now on

## 2022-12-07 ENCOUNTER — Other Ambulatory Visit (HOSPITAL_COMMUNITY): Payer: Self-pay

## 2022-12-12 ENCOUNTER — Other Ambulatory Visit: Payer: Self-pay | Admitting: Gastroenterology

## 2022-12-13 ENCOUNTER — Telehealth: Payer: Self-pay | Admitting: Gastroenterology

## 2022-12-13 DIAGNOSIS — M47812 Spondylosis without myelopathy or radiculopathy, cervical region: Secondary | ICD-10-CM | POA: Diagnosis not present

## 2022-12-13 DIAGNOSIS — F32A Depression, unspecified: Secondary | ICD-10-CM | POA: Diagnosis not present

## 2022-12-13 DIAGNOSIS — G894 Chronic pain syndrome: Secondary | ICD-10-CM | POA: Diagnosis not present

## 2022-12-13 DIAGNOSIS — M47816 Spondylosis without myelopathy or radiculopathy, lumbar region: Secondary | ICD-10-CM | POA: Diagnosis not present

## 2022-12-13 NOTE — Telephone Encounter (Signed)
Inbound call from patient stating she [facility] [address] [facility] [place]. Please advise, thank you.

## 2022-12-14 NOTE — Telephone Encounter (Signed)
On 6/12 patient was made aware to get phenergan from PCP and to have barium swallow done. Will see about transferring script if there is a script

## 2022-12-14 NOTE — Telephone Encounter (Signed)
Script was picked up by patient at the [place] location. They transferred script

## 2023-01-01 ENCOUNTER — Ambulatory Visit: Payer: Medicare Other | Attending: Cardiology | Admitting: Cardiology

## 2023-01-01 ENCOUNTER — Encounter: Payer: Self-pay | Admitting: Cardiology

## 2023-01-01 VITALS — BP 140/80 | HR 103 | Ht 60.0 in | Wt 159.8 lb

## 2023-01-01 DIAGNOSIS — N183 Chronic kidney disease, stage 3 unspecified: Secondary | ICD-10-CM

## 2023-01-01 DIAGNOSIS — I10 Essential (primary) hypertension: Secondary | ICD-10-CM

## 2023-01-01 DIAGNOSIS — R000 Tachycardia, unspecified: Secondary | ICD-10-CM

## 2023-01-01 DIAGNOSIS — I2510 Atherosclerotic heart disease of native coronary artery without angina pectoris: Secondary | ICD-10-CM | POA: Diagnosis not present

## 2023-01-01 DIAGNOSIS — Z01812 Encounter for preprocedural laboratory examination: Secondary | ICD-10-CM | POA: Diagnosis not present

## 2023-01-01 DIAGNOSIS — E785 Hyperlipidemia, unspecified: Secondary | ICD-10-CM | POA: Diagnosis not present

## 2023-01-01 DIAGNOSIS — R072 Precordial pain: Secondary | ICD-10-CM

## 2023-01-01 MED ORDER — PREDNISONE 50 MG PO TABS: ORAL_TABLET | ORAL | 0 refills | Status: DC

## 2023-01-01 MED ORDER — METOPROLOL TARTRATE 100 MG PO TABS: 100.0000 mg | ORAL_TABLET | Freq: Once | ORAL | 0 refills | Status: DC

## 2023-01-01 MED ORDER — METOPROLOL SUCCINATE ER 25 MG PO TB24: 25.0000 mg | ORAL_TABLET | Freq: Every day | ORAL | 3 refills | Status: DC

## 2023-01-01 MED ORDER — RANOLAZINE ER 1000 MG PO TB12: 1000.0000 mg | ORAL_TABLET | Freq: Two times a day (BID) | ORAL | 3 refills | Status: AC

## 2023-01-01 NOTE — Patient Instructions (Addendum)
Medication Instructions:  Your physician has recommended you make the following change in your medication:  Start Ranexa 1000 mg two times daily Metoprolol Succinate 25 mg daily  *If you need a refill on your cardiac medications before your next appointment, please call your pharmacy*   Lab Work: Your physician recommends that you return for lab work in: Today for a BMP and Direct LDL  If you have labs (blood work) drawn today and your tests are completely normal, you will receive your results only by: MyChart Message (if you have MyChart) OR A paper copy in the mail If you have any lab test that is abnormal or we need to change your treatment, we will call you to review the results.   Testing/Procedures: Please arrive at the [facility] main entrance of [facility] at xx:xx AM (30-45 minutes prior to test start time)  [facility] [address] [place], [place] [address] [phone]  Proceed to the [facility] Radiology Department (First Floor).  Please follow these instructions carefully (unless otherwise directed):   On the Night Before the Test: Drink plenty of water. Do not consume any caffeinated/decaffeinated beverages or chocolate 12 hours prior to your test. Do not take any antihistamines 12 hours prior to your test. If you take Metformin do not take 24 hours prior to test. If the patient has contrast allergy: Patient will need a prescription for Prednisone and very clear instructions (as follows): Prednisone 50 mg - take 13 hours prior to test Take another Prednisone 50 mg 7 hours prior to test Take another Prednisone 50 mg 1 hour prior to test Take Benadryl 50 mg 1 hour prior to test Patient must complete all four doses of above prophylactic medications. Patient will need a ride after test due to Benadryl.  On the Day of the Test: Drink plenty of water. Do not drink any water within one hour of the test. Do not eat any food 4 hours prior  to the test. You may take your regular medications prior to the test. IF NOT ON A BETA BLOCKER - Take 100 mg of lopressor (metoprolol) two hours before the test. HOLD Furosemide morning of the test.  After the Test: Drink plenty of water. After receiving IV contrast, you may experience a mild flushed feeling. This is normal. On occasion, you may experience a mild rash up to 24 hours after the test. This is not dangerous. If this occurs, you can take Benadryl 25 mg and increase your fluid intake. If you experience trouble breathing, this can be serious. If it is severe call 911 IMMEDIATELY. If it is mild, please call our office. If you take any of these medications: Glipizide/Metformin, Avandament, Glucavance, please do not take 48 hours after completing test.    Follow-Up: At [facility], you and your health needs are our priority.  As part of our continuing mission to provide you with exceptional heart care, we have created designated Provider Care Teams.  These Care Teams include your primary Cardiologist (physician) and Advanced Practice Providers (APPs -  Physician Assistants and Nurse Practitioners) who all work together to provide you with the care you need, when you need it.  We recommend signing up for the patient portal called "MyChart".  Sign up information is provided on this After Visit Summary.  MyChart is used to connect with patients for Virtual Visits (Telemedicine).  Patients are able to view lab/test results, encounter notes, upcoming appointments, etc.  Non-urgent messages  can be sent to your provider as well.   To learn more about what you can do with MyChart, go to [email or web address].    Your next appointment:   2-3  week(s)  Provider:   Gypsy Balsam, MD    Other Instructions

## 2023-01-01 NOTE — Progress Notes (Addendum)
Cardiology Office Note:  .   Date:  01/01/2023  ID:  Amy Moon, DOB 05-Jan-1951, MRN [id number] PCP: Abner Greenspan, MD  [facility] Providers Cardiologist:  Gypsy Balsam, MD    History of Present Illness: .    Amy Moon is a 72 y.o. female with a past medical history of nonobstructive CAD per coronary CTA, HTN, GERD, cirrhosis, Parkinson's disease, CKD stage III, past history of smoking, chronic pain syndrome, dyslipidemia.  12/06/2019 coronary CTA revealing a calcium score of 865, moderate CAD of the proximal to mid LAD however FFR revealed no significant hemodynamic stenosis 05/31/2022 echo EF 55 to 60% mild concentric LVH grade 1 DD no valvular abnormalities.  Most recently evaluated by Dr. Bing Matter on 05/15/2022, at that time she did have complaints of sharp chest pain that occurred at rest, Imdur was tried however she developed a severe headache and she was started on ranolazine.  An echocardiogram was arranged and completed on 05/31/2022 which revealed an EF of 55 to 60%, mild concentric LVH, grade 1 DD, no valvular abnormalities.  She presents today for follow-up of chest pain that has been intermittent for some time.  She endorses a high level of personal stress, recently caring for a family member who has had to move into assisted living facility.  Her chest pain has mixed features, can occur at rest or with exertion, can last for minutes or up to 30 minutes.  She has taken her nitroglycerin, did not notice that it helped much.  She complains of an elevated heart rate as well. She denies palpitations, dyspnea, pnd, orthopnea, n, v, dizziness, syncope, edema, weight gain, or early satiety.   ROS: Review of Systems  Constitutional: Negative.   HENT: Negative.    Eyes: Negative.   Respiratory: Negative.    Cardiovascular:  Positive for chest pain and leg swelling.  Gastrointestinal: Negative.   Genitourinary: Negative.   Musculoskeletal: Negative.   Skin: Negative.    Neurological: Negative.   Endo/Heme/Allergies: Negative.   Psychiatric/Behavioral: Negative.       Studies Reviewed: .        Cardiac Studies & Procedures     STRESS TESTS  MYOCARDIAL PERFUSION IMAGING 11/26/2017  Narrative  Nuclear stress EF: 77%.  There was no ST segment deviation noted during stress.  This is a low risk study.  The left ventricular ejection fraction is hyperdynamic (>65%).  1. EF 77%, normal wall motion. 2. Fixed small, mild basal to mid inferior perfusion defect.  Given normal wall motion, this may be soft tissue attenuation.  No evidence for ischemia.  Low risk study.   ECHOCARDIOGRAM  ECHOCARDIOGRAM COMPLETE 05/31/2022  Narrative ECHOCARDIOGRAM REPORT    Patient Name:   Amy Moon Date of Exam: 05/31/2022 Medical Rec #:  [id number]      Height:       60.0 in Accession #:    [id number]     Weight:       154.0 lb Date of Birth:  08/04/1950       BSA:          1.670 m Patient Age:    71 years       BP:           130/70 mmHg Patient Gender: F              HR:           92 bpm. Exam Location:  [facility]  Procedure: 2D Echo, Cardiac  Doppler and Color Doppler  Indications:    Dyspnea on exertion [R06.09 (ICD-10-CM)]  History:        Patient has prior history of Echocardiogram examinations, most recent 10/24/2019. Signs/Symptoms:Dyspnea; Risk Factors:Hypertension.  Sonographer:    Margreta Journey RDCS Referring Phys: [id number] Georgeanna Lea   Sonographer Comments: Global longitudinal strain was attempted. IMPRESSIONS   1. Left ventricular ejection fraction, by estimation, is 55 to 60%. The left ventricle has normal function. The left ventricle has no regional wall motion abnormalities. There is mild concentric left ventricular hypertrophy. Left ventricular diastolic parameters are consistent with Grade I diastolic dysfunction (impaired relaxation). 2. Right ventricular systolic function is normal. The right ventricular size is  normal. 3. The mitral valve is normal in structure. No evidence of mitral valve regurgitation. No evidence of mitral stenosis. 4. The aortic valve is tricuspid. Aortic valve regurgitation is not visualized. No aortic stenosis is present.  FINDINGS Left Ventricle: Left ventricular ejection fraction, by estimation, is 55 to 60%. The left ventricle has normal function. The left ventricle has no regional wall motion abnormalities. The left ventricular internal cavity size was normal in size. There is mild concentric left ventricular hypertrophy. Left ventricular diastolic parameters are consistent with Grade I diastolic dysfunction (impaired relaxation). Indeterminate filling pressures.  Right Ventricle: The right ventricular size is normal. No increase in right ventricular wall thickness. Right ventricular systolic function is normal.  Left Atrium: Left atrial size was normal in size.  Right Atrium: Right atrial size was normal in size.  Pericardium: There is no evidence of pericardial effusion.  Mitral Valve: The mitral valve is normal in structure. No evidence of mitral valve regurgitation. No evidence of mitral valve stenosis.  Tricuspid Valve: The tricuspid valve is normal in structure. Tricuspid valve regurgitation is not demonstrated. No evidence of tricuspid stenosis.  Aortic Valve: The aortic valve is tricuspid. Aortic valve regurgitation is not visualized. No aortic stenosis is present.  Pulmonic Valve: The pulmonic valve was normal in structure. Pulmonic valve regurgitation is not visualized. No evidence of pulmonic stenosis.  Aorta: The aortic root, ascending aorta and aortic arch are all structurally normal, with no evidence of dilitation or obstruction.  Venous: A normal flow pattern is recorded from the right upper pulmonary vein. The inferior vena cava was not well visualized.  IAS/Shunts: No atrial level shunt detected by color flow Doppler.   LEFT VENTRICLE PLAX  2D LVIDd:         3.90 cm   Diastology LVIDs:         2.80 cm   LV e' medial:    6.64 cm/s LV PW:         1.00 cm   LV E/e' medial:  9.0 LV IVS:        1.00 cm   LV e' lateral:   12.60 cm/s LVOT diam:     2.10 cm   LV E/e' lateral: 4.8 LV SV:         69 LV SV Index:   41 LVOT Area:     3.46 cm   RIGHT VENTRICLE             IVC RV Basal diam:  2.40 cm     IVC diam: 1.50 cm RV S prime:     15.30 cm/s TAPSE (M-mode): 2.6 cm  LEFT ATRIUM             Index        RIGHT ATRIUM  Index LA diam:        3.50 cm 2.10 cm/m   RA Area:     12.90 cm LA Vol (A2C):   41.7 ml 24.96 ml/m  RA Volume:   28.00 ml  16.76 ml/m LA Vol (A4C):   36.4 ml 21.79 ml/m LA Biplane Vol: 38.8 ml 23.23 ml/m AORTIC VALVE LVOT Vmax:   112.00 cm/s LVOT Vmean:  78.700 cm/s LVOT VTI:    0.199 m  AORTA Ao Root diam: 3.20 cm Ao Asc diam:  3.30 cm Ao Desc diam: 1.65 cm  MITRAL VALVE MV Area (PHT): 5.66 cm     SHUNTS MV Decel Time: 134 msec     Systemic VTI:  0.20 m MV E velocity: 60.00 cm/s   Systemic Diam: 2.10 cm MV A velocity: 103.00 cm/s MV E/A ratio:  0.58  Norman Herrlich MD Electronically signed by Norman Herrlich MD Signature Date/Time: 05/31/2022/5:37:24 PM    Final     CT SCANS  CT CORONARY MORPH W/CTA COR W/SCORE 03/09/2020  Addendum 03/09/2020  1:33 PM ADDENDUM REPORT: 03/09/2020 13:30  ADDENDUM: Discussed with medical imaging - due to significant respiratory motion artifact, the study could not be submitted for FFR. If significant clinical concern for ischemia, consider myoview perfusion study or cardiac catheterization to further evaluate LAD disease.   Electronically Signed By: Chrystie Nose M.D. On: 03/09/2020 13:30  Addendum 03/08/2020  8:23 PM ADDENDUM REPORT: 03/08/2020 20:21  HISTORY: 72 yo female with chest pain/anginal equiv, 77yr CHD risk > 20%, not treadmill candidate  EXAM: Cardiac/Coronary CTA  TECHNIQUE: The patient was scanned on a [employer].  PROTOCOL: A 120 kV prospective scan was triggered in the descending thoracic aorta at 111 HU's. Axial non-contrast 3 mm slices were carried out through the heart. The data set was analyzed on a dedicated work station and scored using the Agatson method. Gantry rotation speed was 250 msecs and collimation was .6 mm. Beta blockade and 0.8 mg of sl NTG was given. The 3D data set was reconstructed in 5% intervals of the 67-82 % of the R-R cycle. Diastolic phases were analyzed on a dedicated work station using MPR, MIP and VRT modes. The patient received 80mL OMNIPAQUE IOHEXOL 350 MG/ML SOLN of contrast.  FINDINGS: Quality: Poor, HR 68, misalignment artifact/respiratory motion  Coronary calcium score: The patient's coronary artery calcium score is 865, which places the patient in the 96th percentile.  Coronary arteries: Normal coronary origins.  Right dominance.  Right Coronary Artery: Dominant. Mild proximal mixed 25-49% stenosis.  Left Main Coronary Artery: Minimal ostial calcification. Bifurcates into the LAD and LCx arteries.  Left Anterior Descending Coronary Artery: Heavy ostial/proximal calcification with moderate 50-69% mixed proximal stenotic segment (CADRADS3). Mild 25-49% mixed mid-vessel stenosis (CADRADS2). Small OM1 branch and larger OM2 branch which bifurcates, no stenosis. Tiny OM3 branch.  Left Circumflex Artery: The ostial to proximal AV LCx vessel is without stenosis - the mid-distal LCx could not be visualized.  Aorta: Normal size, 32 mm at the mid ascending aorta (level of the PA bifurcation) measured double oblique. Aortic atherosclerosis. No dissection.  Aortic Valve: Trileaflet.  No calcifications.  Other findings:  Normal pulmonary vein drainage into the left atrium.  Normal left atrial appendage without a thrombus.  Normal size of the pulmonary artery.  IMPRESSION: 1. Notwithstanding respiratory motion/mis-alignment artifact,  there appears to be moderate mixed CAD primarily of the proximal to mid LAD, CADRADS = 3. CT FFR will be performed and reported  separately.  2. Coronary calcium score of 865. This was 96th percentile for age and sex matched control.  3. Normal coronary origin with right dominance.  4. Aortic atherosclerosis.   Electronically Signed By: Chrystie Nose M.D. On: 03/08/2020 20:21  Narrative EXAM: OVER-READ INTERPRETATION  CT CHEST  The following report is an over-read performed by radiologist Dr. Trudie Reed of [facility] on 03/08/2020. This over-read does not include interpretation of cardiac or coronary anatomy or pathology. The coronary calcium score/coronary CTA interpretation by the cardiologist is attached.  COMPARISON:  None.  FINDINGS: Aortic atherosclerosis. Within the visualized portions of the thorax there are no suspicious appearing pulmonary nodules or masses, there is no acute consolidative airspace disease, no pleural effusions, no pneumothorax and no lymphadenopathy. Visualized portions of the upper abdomen are unremarkable. There are no aggressive appearing lytic or blastic lesions noted in the visualized portions of the skeleton.  IMPRESSION: 1.  Aortic Atherosclerosis (ICD10-I70.0).  Electronically Signed: By: Trudie Reed M.D. On: 03/08/2020 12:17          Risk Assessment/Calculations:     HYPERTENSION CONTROL Vitals:   01/01/23 1318 01/01/23 1419  BP: (!) 140/80 (!) 140/80    The patient's blood pressure is elevated above target today.  In order to address the patient's elevated BP: A current anti-hypertensive medication was adjusted today.          Physical Exam:   VS:  BP (!) 140/80   Pulse (!) 103   Ht 5' (1.524 m)   Wt 159 lb 12.8 oz (72.5 kg)   SpO2 94%   BMI 31.21 kg/m    Wt Readings from Last 3 Encounters:  01/01/23 159 lb 12.8 oz (72.5 kg)  05/15/22 154 lb (69.9 kg)  04/13/22 151 lb (68.5 kg)     GEN: Well nourished, well developed in no acute distress NECK: No JVD; No carotid bruits CARDIAC: RRR, no murmurs, rubs, gallops RESPIRATORY:  Clear to auscultation without rales, wheezing or rhonchi  ABDOMEN: Soft, non-tender, non-distended EXTREMITIES:  No edema; No deformity   ASSESSMENT AND PLAN: .   CAD -nonobstructive per coronary CTA in 2021, she continues to have some atypical type chest pain that has mixed features, will arrange for repeat coronary CTA, she has an allergy to iodine we will premedicate her with prednisone.  Will start Ranexa 1000 mg twice daily.  Will start metoprolol 25 mg daily.  Continue atorvastatin 10 mg daily, continue Plavix 75 mg daily, continue nitroglycerin as needed.  Hypertension-blood pressure is mildly elevated at 140/80 however she endorsed rushing around to get here today.  Will start her on metoprolol for tachycardia, this may help with her blood pressure little bit too.  Tachycardia - heart rate is elevated, wills start metoprolol succinate 25 mg daily.   Hyperlipidemia-most recent LDL was well-controlled at 63 on 08/31/2021, will repeat direct LDL today.  Continue atorvastatin 10 mg daily.  LFTs were WNL on 10/18/2022.  CKD stage III - Careful titration of diuretic and antihypertensive.         Dispo: Start Ranexa 1000 mg twice daily, start metoprolol 25 mg daily.  Coronary CTA for chest pain.  Repeat direct LDL.  **addendum, coronary CTA was going to be cost-prohibitive for patient. Will proceed with Lexiscan.  Informed Consent   Shared Decision Making/Informed Consent The risks [chest pain, shortness of breath, cardiac arrhythmias, dizziness, blood pressure fluctuations, myocardial infarction, stroke/transient ischemic attack, nausea, vomiting, allergic reaction, radiation exposure, metallic taste sensation  and life-threatening complications (estimated to be 1 in 10,000)], benefits (risk stratification, diagnosing coronary artery disease, treatment  guidance) and alternatives of a nuclear stress test were discussed in detail with Amy Moon and she agrees to proceed.    01/30/23 JCW  Signed, Flossie Dibble, NP

## 2023-01-02 ENCOUNTER — Telehealth: Payer: Self-pay | Admitting: *Deleted

## 2023-01-02 LAB — BASIC METABOLIC PANEL WITH GFR
BUN/Creatinine Ratio: 18 (ref 12–28)
BUN: 10 mg/dL (ref 8–27)
CO2: 26 mmol/L (ref 20–29)
Calcium: 9.7 mg/dL (ref 8.7–10.3)
Chloride: 101 mmol/L (ref 96–106)
Creatinine, Ser: 0.55 mg/dL — ABNORMAL LOW (ref 0.57–1.00)
Glucose: 78 mg/dL (ref 70–99)
Potassium: 3.8 mmol/L (ref 3.5–5.2)
Sodium: 142 mmol/L (ref 134–144)
eGFR: 96 mL/min/1.73

## 2023-01-02 LAB — LDL CHOLESTEROL, DIRECT: LDL Direct: 70 mg/dL (ref 0–99)

## 2023-01-02 NOTE — Telephone Encounter (Signed)
Left message for pt to call back  °

## 2023-01-02 NOTE — Telephone Encounter (Signed)
-----   Message from Flossie Dibble, NP sent at 01/02/2023 12:58 PM EDT ----- Normal electrolytes, Cholesterol is at goal.

## 2023-01-03 NOTE — Telephone Encounter (Signed)
Informed pt of normal labs and made her appt with Dr. Kirtland Bouchard in 1 month. Let her know it could be up to 2 weeks before [facility] calls to set up CT.

## 2023-01-08 ENCOUNTER — Encounter (HOSPITAL_COMMUNITY): Payer: Self-pay

## 2023-01-08 ENCOUNTER — Telehealth (HOSPITAL_COMMUNITY): Payer: Self-pay | Admitting: *Deleted

## 2023-01-08 NOTE — Telephone Encounter (Signed)
Attempted to call patient regarding upcoming cardiac CT appointment. °Left message on voicemail with name and callback number ° °Merle Prescott RN Navigator Cardiac Imaging °[facility] °[phone] Office °[phone] Cell ° °

## 2023-01-09 ENCOUNTER — Ambulatory Visit (HOSPITAL_COMMUNITY): Admission: RE | Admit: 2023-01-09 | Payer: Medicare Other | Source: Ambulatory Visit

## 2023-01-16 ENCOUNTER — Ambulatory Visit: Payer: Medicare Other | Admitting: Family Medicine

## 2023-01-26 ENCOUNTER — Encounter (HOSPITAL_COMMUNITY): Payer: Self-pay

## 2023-01-29 ENCOUNTER — Ambulatory Visit (HOSPITAL_COMMUNITY): Admission: RE | Admit: 2023-01-29 | Payer: Medicare Other | Source: Ambulatory Visit

## 2023-01-30 ENCOUNTER — Telehealth: Payer: Self-pay

## 2023-01-30 DIAGNOSIS — R079 Chest pain, unspecified: Secondary | ICD-10-CM

## 2023-01-30 NOTE — Telephone Encounter (Signed)
-----   Message from Flossie Dibble sent at 01/29/2023 10:50 AM EDT ----- Regarding: RE: CCTA issues Thank you, we will follow up with her.   Can we arrange for her to have a lexiscan Richard or ? ----- Message ----- From: Lennie Odor, RN Sent: 01/29/2023   8:15 AM EDT To: Georgeanna Lea, MD; Dorette Grate, RN; # Subject: CCTA issues                                    Good morning,   I wanted to share that this patient called to cancel her CCTA appt today due to the cost of the test. States her insurance will not cover the exam and her balance is >$2,000.  Dr. Dawson Bills, please reach out to patient with new alternative plan of care. She's having some anxiety about this.  Thank you, Rockwell Alexandria RN Navigator Cardiac Imaging [facility] [phone] Office  [phone] Cell

## 2023-01-31 ENCOUNTER — Ambulatory Visit: Payer: Medicare Other | Admitting: Family Medicine

## 2023-01-31 ENCOUNTER — Ambulatory Visit: Payer: Medicare Other | Admitting: Cardiology

## 2023-02-03 ENCOUNTER — Other Ambulatory Visit: Payer: Self-pay | Admitting: Cardiology

## 2023-02-07 ENCOUNTER — Telehealth (HOSPITAL_COMMUNITY): Payer: Self-pay | Admitting: *Deleted

## 2023-02-07 ENCOUNTER — Ambulatory Visit: Payer: Medicare Other | Admitting: Family Medicine

## 2023-02-07 NOTE — Telephone Encounter (Signed)
Patient given detailed instructions per Myocardial Perfusion Study Information Sheet for the test on 02/08/2023 at 11:00. Patient notified to arrive 15 minutes early and that it is imperative to arrive on time for appointment to keep from having the test rescheduled.  If you need to cancel or reschedule your appointment, please call the office within 24 hours of your appointment. . Patient verbalized understanding.Amy Moon

## 2023-02-08 ENCOUNTER — Ambulatory Visit: Payer: Medicare Other | Attending: Cardiology

## 2023-02-08 DIAGNOSIS — R079 Chest pain, unspecified: Secondary | ICD-10-CM | POA: Diagnosis not present

## 2023-02-08 LAB — MYOCARDIAL PERFUSION IMAGING
LV dias vol: 67 mL (ref 46–106)
LV sys vol: 19 mL
Nuc Stress EF: 71 %
Peak HR: 87 {beats}/min
Rest HR: 80 {beats}/min
Rest Nuclear Isotope Dose: 10.7 mCi
SDS: 7
SRS: 2
SSS: 9
ST Depression (mm): 0 mm
Stress Nuclear Isotope Dose: 30.1 mCi
TID: 0.92

## 2023-02-08 MED ORDER — REGADENOSON 0.4 MG/5ML IV SOLN
0.4000 mg | Freq: Once | INTRAVENOUS | Status: AC
Start: 2023-02-08 — End: 2023-02-08
  Administered 2023-02-08: 0.4 mg via INTRAVENOUS

## 2023-02-08 MED ORDER — TECHNETIUM TC 99M TETROFOSMIN IV KIT
30.1000 | PACK | Freq: Once | INTRAVENOUS | Status: AC | PRN
  Administered 2023-02-08: 30.1 via INTRAVENOUS

## 2023-02-08 MED ORDER — TECHNETIUM TC 99M TETROFOSMIN IV KIT
10.7000 | PACK | Freq: Once | INTRAVENOUS | Status: AC | PRN
  Administered 2023-02-08: 10.7 via INTRAVENOUS

## 2023-02-13 DIAGNOSIS — G894 Chronic pain syndrome: Secondary | ICD-10-CM | POA: Diagnosis not present

## 2023-02-13 DIAGNOSIS — F32A Depression, unspecified: Secondary | ICD-10-CM | POA: Diagnosis not present

## 2023-02-13 DIAGNOSIS — M47816 Spondylosis without myelopathy or radiculopathy, lumbar region: Secondary | ICD-10-CM | POA: Diagnosis not present

## 2023-02-13 DIAGNOSIS — M47812 Spondylosis without myelopathy or radiculopathy, cervical region: Secondary | ICD-10-CM | POA: Diagnosis not present

## 2023-02-19 ENCOUNTER — Telehealth: Payer: Self-pay | Admitting: Gastroenterology

## 2023-02-19 NOTE — Telephone Encounter (Signed)
Inbound call from patient requesting a refill for promethazine medication. Patient is scheduled for 11/13. Please advise, thank you.

## 2023-02-19 NOTE — Telephone Encounter (Signed)
Patient advised to have refill filled through PCP.

## 2023-02-19 NOTE — Telephone Encounter (Deleted)
Patient was told to call PCP regarding this

## 2023-02-22 ENCOUNTER — Ambulatory Visit: Payer: Medicare Other | Admitting: Family Medicine

## 2023-03-05 DIAGNOSIS — H353131 Nonexudative age-related macular degeneration, bilateral, early dry stage: Secondary | ICD-10-CM | POA: Diagnosis not present

## 2023-03-05 DIAGNOSIS — H524 Presbyopia: Secondary | ICD-10-CM | POA: Diagnosis not present

## 2023-03-05 DIAGNOSIS — Z961 Presence of intraocular lens: Secondary | ICD-10-CM | POA: Diagnosis not present

## 2023-03-05 DIAGNOSIS — H1789 Other corneal scars and opacities: Secondary | ICD-10-CM | POA: Diagnosis not present

## 2023-03-15 ENCOUNTER — Ambulatory Visit: Payer: Medicare Other | Admitting: Family Medicine

## 2023-05-09 ENCOUNTER — Ambulatory Visit: Payer: Medicare Other | Admitting: Gastroenterology

## 2023-05-09 ENCOUNTER — Telehealth: Payer: Self-pay | Admitting: Gastroenterology

## 2023-05-09 NOTE — Telephone Encounter (Signed)
Good afternoon Dr. Chales Abrahams,   Patient called stating that she would not be able to make her 11:20 appointment with you today.   Patient was rescheduled for 2/27 for 3:00

## 2023-05-10 DIAGNOSIS — G894 Chronic pain syndrome: Secondary | ICD-10-CM | POA: Diagnosis not present

## 2023-05-10 DIAGNOSIS — Z79891 Long term (current) use of opiate analgesic: Secondary | ICD-10-CM | POA: Diagnosis not present

## 2023-06-12 ENCOUNTER — Other Ambulatory Visit: Payer: Self-pay | Admitting: Cardiology

## 2023-06-23 ENCOUNTER — Other Ambulatory Visit: Payer: Self-pay | Admitting: Cardiology

## 2023-06-23 DIAGNOSIS — E876 Hypokalemia: Secondary | ICD-10-CM

## 2023-08-23 ENCOUNTER — Ambulatory Visit: Payer: Medicare Other | Admitting: Gastroenterology

## 2023-09-04 DIAGNOSIS — G894 Chronic pain syndrome: Secondary | ICD-10-CM | POA: Diagnosis not present

## 2023-09-04 DIAGNOSIS — Z79891 Long term (current) use of opiate analgesic: Secondary | ICD-10-CM | POA: Diagnosis not present

## 2023-09-11 ENCOUNTER — Other Ambulatory Visit: Payer: Self-pay | Admitting: Gastroenterology

## 2023-09-11 ENCOUNTER — Other Ambulatory Visit: Payer: Self-pay | Admitting: Cardiology

## 2023-09-11 DIAGNOSIS — R072 Precordial pain: Secondary | ICD-10-CM

## 2023-09-26 ENCOUNTER — Other Ambulatory Visit: Payer: Self-pay | Admitting: Cardiology

## 2023-09-26 ENCOUNTER — Other Ambulatory Visit: Payer: Self-pay | Admitting: Gastroenterology

## 2023-11-29 ENCOUNTER — Other Ambulatory Visit: Payer: Self-pay | Admitting: Cardiology

## 2024-01-10 ENCOUNTER — Other Ambulatory Visit: Payer: Self-pay | Admitting: Cardiology

## 2024-01-10 DIAGNOSIS — R072 Precordial pain: Secondary | ICD-10-CM

## 2024-01-31 ENCOUNTER — Other Ambulatory Visit: Payer: Self-pay | Admitting: Cardiology

## 2024-02-13 ENCOUNTER — Other Ambulatory Visit: Payer: Self-pay | Admitting: Cardiology

## 2024-02-13 DIAGNOSIS — R072 Precordial pain: Secondary | ICD-10-CM

## 2024-03-04 ENCOUNTER — Other Ambulatory Visit: Payer: Self-pay | Admitting: Cardiology

## 2024-03-04 ENCOUNTER — Other Ambulatory Visit: Payer: Self-pay | Admitting: Gastroenterology

## 2024-03-04 DIAGNOSIS — E876 Hypokalemia: Secondary | ICD-10-CM

## 2024-03-05 ENCOUNTER — Telehealth: Payer: Self-pay | Admitting: Cardiology

## 2024-03-05 DIAGNOSIS — E876 Hypokalemia: Secondary | ICD-10-CM

## 2024-03-05 MED ORDER — PANTOPRAZOLE SODIUM 40 MG PO TBEC: 40.0000 mg | DELAYED_RELEASE_TABLET | Freq: Two times a day (BID) | ORAL | 0 refills | Status: AC

## 2024-03-05 MED ORDER — POTASSIUM CHLORIDE ER 10 MEQ PO TBCR
10.0000 meq | EXTENDED_RELEASE_TABLET | Freq: Every day | ORAL | 0 refills | Status: DC
Start: 2024-03-05 — End: 2024-03-13

## 2024-03-05 NOTE — Telephone Encounter (Signed)
 Pt's medication was sent to pt's pharmacy as requested. Confirmation received.

## 2024-03-05 NOTE — Addendum Note (Signed)
 Addended by: KATHIE BOTTCHER E on: 03/05/2024 09:03 AM   Modules accepted: Orders

## 2024-03-05 NOTE — Telephone Encounter (Signed)
*  STAT* If patient is at the pharmacy, call can be transferred to refill team.   1. Which medications need to be refilled? (please list name of each medication and dose if known) potassium chloride  (KLOR-CON ) 10 MEQ tablet    4. Which pharmacy/location (including street and city if local pharmacy) is medication to be sent to?  [id number] - [place], [place] - [address]     5. Do they need a 30 day or 90 day supply? 90   Scheduled 9/16

## 2024-03-06 DIAGNOSIS — R112 Nausea with vomiting, unspecified: Secondary | ICD-10-CM | POA: Insufficient documentation

## 2024-03-06 DIAGNOSIS — I739 Peripheral vascular disease, unspecified: Secondary | ICD-10-CM | POA: Insufficient documentation

## 2024-03-06 DIAGNOSIS — G43909 Migraine, unspecified, not intractable, without status migrainosus: Secondary | ICD-10-CM | POA: Insufficient documentation

## 2024-03-11 ENCOUNTER — Ambulatory Visit: Admitting: Cardiology

## 2024-03-13 ENCOUNTER — Ambulatory Visit: Admitting: Cardiology

## 2024-03-13 ENCOUNTER — Telehealth: Payer: Self-pay | Admitting: Cardiology

## 2024-03-13 DIAGNOSIS — R072 Precordial pain: Secondary | ICD-10-CM

## 2024-03-13 DIAGNOSIS — E876 Hypokalemia: Secondary | ICD-10-CM

## 2024-03-13 MED ORDER — POTASSIUM CHLORIDE ER 10 MEQ PO TBCR
10.0000 meq | EXTENDED_RELEASE_TABLET | Freq: Every day | ORAL | 2 refills | Status: DC
Start: 2024-03-13 — End: 2024-03-14

## 2024-03-13 MED ORDER — CLOPIDOGREL BISULFATE 75 MG PO TABS
75.0000 mg | ORAL_TABLET | Freq: Every day | ORAL | 2 refills | Status: DC
Start: 2024-03-13 — End: 2024-03-14

## 2024-03-13 NOTE — Telephone Encounter (Signed)
 Pt's medications were sent to pt's pharmacy as requested. Confirmation received.

## 2024-03-13 NOTE — Telephone Encounter (Signed)
*  STAT* If patient is at the pharmacy, call can be transferred to refill team.   1. Which medications need to be refilled? (please list name of each medication and dose if known)   clopidogrel  (PLAVIX ) 75 MG tablet  potassium chloride  (KLOR-CON ) 10 MEQ tablet   2. Would you like to learn more about the convenience, safety, & potential cost savings by using the [facility]?   3. Are you open to using the [facility] (Type [facility]. ).  4. Which pharmacy/location (including street and city if local pharmacy) is medication to be sent to?  [id number] - [place], [place] - [address]   5. Do they need a 30 day or 90 day supply?   Patient stated she is completely out of these medications.  Patient has appointment scheduled with Dr. Bernie on 12/16.

## 2024-03-14 MED ORDER — CLOPIDOGREL BISULFATE 75 MG PO TABS
75.0000 mg | ORAL_TABLET | Freq: Every day | ORAL | 2 refills | Status: AC
Start: 2024-03-14 — End: ?

## 2024-03-14 MED ORDER — POTASSIUM CHLORIDE ER 10 MEQ PO TBCR
10.0000 meq | EXTENDED_RELEASE_TABLET | Freq: Every day | ORAL | 2 refills | Status: AC
Start: 2024-03-14 — End: ?

## 2024-03-14 NOTE — Addendum Note (Signed)
 Addended by: BLUFORD RAMP D on: 03/14/2024 11:11 AM   Modules accepted: Orders

## 2024-03-14 NOTE — Telephone Encounter (Signed)
 Pt called in and stated that [facility] [place] per [facility].  This med was sent to [facility]  .

## 2024-03-14 NOTE — Telephone Encounter (Signed)
Pt's medications were resent to pt's preferred pharmacy as requested. Confirmation received. 

## 2024-04-30 ENCOUNTER — Ambulatory Visit: Admitting: Cardiology

## 2024-05-28 ENCOUNTER — Telehealth: Payer: Self-pay | Admitting: Cardiology

## 2024-05-28 NOTE — Telephone Encounter (Signed)
   1. Are you having CP right now? No     2. Are you experiencing any other symptoms (ex. SOB, nausea, vomiting, sweating)? No    3. Is your CP continuous or coming and going? Coming and going    4. Have you taken Nitroglycerin ? No    5. How long have you been experiencing CP? Has worsened in frequency over the past two months     6. If NO CP at time of call then end call with telling Pt to call back or call 911 if Chest pain returns prior to return call from triage team.  If swelling, where is the swelling located? Feet and legs   How much weight have you gained and in what time span? Not sure   Have you gained 2 pounds in a day or 5 pounds in a week? Yes, not sure how much. About 5-8 pounds   Do you have a log of your daily weights (if so, list)?   Are you currently taking a fluid pill? Yes   Are you currently SOB? No   Have you traveled recently in a car or plane for an extended period of time? No   Pt states she has been having more swelling than usual. She also and chest pain sometimes that goes down her arm. She is worried about symptoms and feels she may need to be seen before January. Pt had no active symptoms at time of call.  Please advise.

## 2024-05-29 NOTE — Telephone Encounter (Signed)
 Spoke with pt, She stated that she was wanting a sooner appt than Jan. She reported increased feet and leg swelling and more frequent chest pain. Advised to go to ED for any Urgent or emergent needs. Sent to front desk to make appt.

## 2024-06-10 ENCOUNTER — Ambulatory Visit: Admitting: Cardiology

## 2024-07-08 ENCOUNTER — Ambulatory Visit: Admitting: Cardiology

## 2024-07-25 ENCOUNTER — Ambulatory Visit: Admitting: Cardiology

## 2024-08-19 ENCOUNTER — Ambulatory Visit: Admitting: Cardiology

## 2024-09-08 ENCOUNTER — Ambulatory Visit: Admitting: Cardiology
